# Patient Record
Sex: Female | Born: 1950 | Hispanic: No | Smoking: Current every day smoker
Health system: Southern US, Community
[De-identification: ages and names within clinical notes are randomized; demographics above are authoritative.]

## PROBLEM LIST (undated history)

## (undated) DIAGNOSIS — I10 Essential (primary) hypertension: Secondary | ICD-10-CM

## (undated) DIAGNOSIS — F329 Major depressive disorder, single episode, unspecified: Secondary | ICD-10-CM

## (undated) DIAGNOSIS — B019 Varicella without complication: Secondary | ICD-10-CM

## (undated) DIAGNOSIS — T7840XA Allergy, unspecified, initial encounter: Secondary | ICD-10-CM

## (undated) DIAGNOSIS — E785 Hyperlipidemia, unspecified: Secondary | ICD-10-CM

## (undated) DIAGNOSIS — J45909 Unspecified asthma, uncomplicated: Secondary | ICD-10-CM

## (undated) DIAGNOSIS — F32A Depression, unspecified: Secondary | ICD-10-CM

## (undated) DIAGNOSIS — A630 Anogenital (venereal) warts: Secondary | ICD-10-CM

## (undated) HISTORY — DX: Allergy, unspecified, initial encounter: T78.40XA

## (undated) HISTORY — DX: Unspecified asthma, uncomplicated: J45.909

## (undated) HISTORY — PX: CORONARY ANGIOPLASTY WITH STENT PLACEMENT: SHX49

## (undated) HISTORY — PX: DILATION AND CURETTAGE OF UTERUS: SHX78

## (undated) HISTORY — DX: Hyperlipidemia, unspecified: E78.5

## (undated) HISTORY — DX: Varicella without complication: B01.9

## (undated) HISTORY — DX: Anogenital (venereal) warts: A63.0

---
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
## 2011-06-04 ENCOUNTER — Emergency Department (HOSPITAL_BASED_OUTPATIENT_CLINIC_OR_DEPARTMENT_OTHER): Payer: 59

## 2011-06-04 ENCOUNTER — Emergency Department (HOSPITAL_BASED_OUTPATIENT_CLINIC_OR_DEPARTMENT_OTHER)
Admission: EM | Admit: 2011-06-04 | Discharge: 2011-06-04 | Disposition: A | Discharge status: Other Acute Inpatient Hospital | Payer: 59 | Attending: Emergency Medicine | Admitting: Emergency Medicine

## 2011-06-04 ENCOUNTER — Emergency Department (INDEPENDENT_AMBULATORY_CARE_PROVIDER_SITE_OTHER): Payer: 59

## 2011-06-04 ENCOUNTER — Encounter: Payer: Self-pay | Admitting: *Deleted

## 2011-06-04 ENCOUNTER — Other Ambulatory Visit: Payer: Self-pay

## 2011-06-04 DIAGNOSIS — R079 Chest pain, unspecified: Secondary | ICD-10-CM | POA: Insufficient documentation

## 2011-06-04 DIAGNOSIS — T1490XA Injury, unspecified, initial encounter: Secondary | ICD-10-CM

## 2011-06-04 DIAGNOSIS — W19XXXA Unspecified fall, initial encounter: Secondary | ICD-10-CM

## 2011-06-04 DIAGNOSIS — I214 Non-ST elevation (NSTEMI) myocardial infarction: Secondary | ICD-10-CM

## 2011-06-04 DIAGNOSIS — E119 Type 2 diabetes mellitus without complications: Secondary | ICD-10-CM | POA: Insufficient documentation

## 2011-06-04 DIAGNOSIS — I10 Essential (primary) hypertension: Secondary | ICD-10-CM | POA: Insufficient documentation

## 2011-06-04 HISTORY — DX: Major depressive disorder, single episode, unspecified: F32.9

## 2011-06-04 HISTORY — DX: Essential (primary) hypertension: I10

## 2011-06-04 HISTORY — DX: Depression, unspecified: F32.A

## 2011-06-04 LAB — CK TOTAL AND CKMB (NOT AT ARMC): Relative Index: 7.1 — ABNORMAL HIGH (ref 0.0–2.5)

## 2011-06-04 LAB — CBC
MCH: 29.9 pg (ref 26.0–34.0)
MCHC: 35 g/dL (ref 30.0–36.0)
MCV: 85.4 fL (ref 78.0–100.0)
Platelets: 355 10*3/uL (ref 150–400)
RDW: 13.3 % (ref 11.5–15.5)

## 2011-06-04 LAB — DIFFERENTIAL
Basophils Absolute: 0.1 10*3/uL (ref 0.0–0.1)
Eosinophils Absolute: 0.2 10*3/uL (ref 0.0–0.7)
Eosinophils Relative: 3 % (ref 0–5)

## 2011-06-04 LAB — BASIC METABOLIC PANEL
BUN: 19 mg/dL (ref 6–23)
CO2: 28 mEq/L (ref 19–32)
Calcium: 10.5 mg/dL (ref 8.4–10.5)
Creatinine, Ser: 0.8 mg/dL (ref 0.50–1.10)
Glucose, Bld: 78 mg/dL (ref 70–99)

## 2011-06-04 LAB — URINALYSIS, ROUTINE W REFLEX MICROSCOPIC
Bilirubin Urine: NEGATIVE
Ketones, ur: NEGATIVE mg/dL
Nitrite: NEGATIVE
Protein, ur: NEGATIVE mg/dL

## 2011-06-04 LAB — D-DIMER, QUANTITATIVE: D-Dimer, Quant: 0.39 ug/mL-FEU (ref 0.00–0.48)

## 2011-06-04 LAB — TROPONIN I: Troponin I: 0.97 ng/mL (ref <0.30)

## 2011-06-04 MED ORDER — HEPARIN BOLUS VIA INFUSION
4000.0000 [IU] | Freq: Once | INTRAVENOUS | Status: AC
  Administered 2011-06-04: 4000 [IU] via INTRAVENOUS

## 2011-06-04 MED ORDER — SODIUM CHLORIDE 0.9 % IV BOLUS (SEPSIS)
250.0000 mL | Freq: Once | INTRAVENOUS | Status: AC
  Administered 2011-06-04: 1000 mL via INTRAVENOUS

## 2011-06-04 MED ORDER — SODIUM CHLORIDE 0.9 % IV SOLN
INTRAVENOUS | Status: DC
  Administered 2011-06-04: 16:00:00 via INTRAVENOUS

## 2011-06-04 MED ORDER — HEPARIN (PORCINE) IN NACL 100-0.45 UNIT/ML-% IJ SOLN
14.0000 [IU]/kg/h | INTRAMUSCULAR | Status: DC
Start: 2011-06-04 — End: 2011-06-04
  Administered 2011-06-04: 14 [IU]/kg/h via INTRAVENOUS
  Filled 2011-06-04: qty 250

## 2011-06-04 NOTE — ED Provider Notes (Addendum)
History     Chief Complaint  Patient presents with  . Chest Pain    Was seen at [facility] for aching in her upper chest this am. EKG was done and sent with pt. States she has been tx for bronchtis for the past 3 weeks. Pain today feels different.   Patient is a 60 y.o. female presenting with chest pain. The history is provided by the patient.  Chest Pain The chest pain began 3 - 5 hours ago. Duration of episode(s) is 5 hours. Chest pain occurs constantly. The chest pain is resolved. At its most intense, the pain is at 10/10. The quality of the pain is described as sharp and stabbing. The pain does not radiate. Primary symptoms include fatigue and nausea. Pertinent negatives for primary symptoms include no syncope, no shortness of breath, no abdominal pain and no vomiting.  Associated symptoms include diaphoresis.  Pertinent negatives for associated symptoms include no near-syncope. She tried aspirin for the symptoms. Risk factors include smoking/tobacco exposure.  Her past medical history is significant for diabetes and hypertension.     Past Medical History  Diagnosis Date  . Hypertension   . Diabetes mellitus   . Depression     History reviewed. No pertinent past surgical history.  History reviewed. No pertinent family history.  History  Substance Use Topics  . Smoking status: Current Everyday Smoker  . Smokeless tobacco: Not on file  . Alcohol Use: No    OB History    Grav Para Term Preterm Abortions TAB SAB Ect Mult Living                  Review of Systems  Constitutional: Positive for diaphoresis and fatigue.  HENT: Negative for neck pain.   Eyes: Negative for visual disturbance.  Respiratory: Negative for shortness of breath.   Cardiovascular: Positive for chest pain. Negative for syncope and near-syncope.  Gastrointestinal: Positive for nausea. Negative for vomiting and abdominal pain.  Genitourinary: Negative for dysuria.  Musculoskeletal: Negative for back  pain.  Skin: Negative for rash.  Neurological: Negative for syncope and headaches.  Psychiatric/Behavioral: Negative for confusion.    Physical Exam  BP 140/76  Pulse 79  Temp(Src) 98.1 F (36.7 C) (Oral)  Resp 20  Ht 5\' 9"  (1.753 m)  Wt 235 lb (106.595 kg)  BMI 34.70 kg/m2  SpO2 100%  Physical Exam  Constitutional: She is oriented to person, place, and time. She appears well-developed and well-nourished.  HENT:  Head: Normocephalic and atraumatic.  Eyes: EOM are normal. Pupils are equal, round, and reactive to light.  Neck: Normal range of motion. Neck supple.  Cardiovascular: Normal rate, regular rhythm and normal heart sounds.   Pulmonary/Chest: Breath sounds normal. No respiratory distress. She exhibits no tenderness.  Abdominal: Soft. Bowel sounds are normal. There is no tenderness.  Musculoskeletal: Normal range of motion. She exhibits no edema and no tenderness.  Lymphadenopathy:    She has cervical adenopathy.  Neurological: She is alert and oriented to person, place, and time. No cranial nerve deficit. She exhibits normal muscle tone.  Skin: Skin is warm. No rash noted. She is not diaphoretic.    ED Course   Date: 06/04/2011  Rate: 57  Rhythm: normal sinus rhythm  QRS Axis: normal  Intervals: normal  ST/T Wave abnormalities: normal  Conduction Disutrbances:right bundle branch block  Narrative Interpretation:   Old EKG Reviewed: none available   Procedures  MDM PATIENT HAD ASA AT [facility] AND ONE ON  HER OWN EARLIER. NO HX OF SIMILAR OR PRIOR CP. SIGNIFICANT CARDIAC RISK FACTORS. EVEN IF CARDIAC WORK UP NEGATIVE WILL MOST LIKELY NEED CARDIAC RULE OUT ADMISSION. FOLLOWED [facility] DR Alwyn Ren. CP RESOLVED AT 1300 AND REMAINS GONE.   CARDIAC MARKERS POSITIVE CW NONSTEMI, STILL CP FREE, HEPARIN ORDERED. CONTACTED HER IM GROUP AT [facility]. THEY USE [facility]. THEY CONTACTED AND CARDIOLOGIST IN A PROCEDURE AND WILL CALL BACK. PATIENT  STABLE AND HAS HAD ASA JUST BEFORE ARRIVAL.   CRITICAL CARE TIME 30 MINUTES. FOR FREQUENT RECHECKS, TIME AT BEDSIDE, RESUSCITATION WITH HEPARIN, DW PROVIDERS, AND FAMILY, AND DIAGNOSTIC INTERPRETATION.   ACCEPTED BY [facility], DR ROEBECK, AWAITING BED CONFIRMATION.   GOING TO BED #460, [facility] TO TRANSPORT.       Shelda Jakes, MD 06/04/11 1513  Shelda Jakes, MD 06/04/11 1514  Shelda Jakes, MD 06/04/11 1650  Shelda Jakes, MD 06/04/11 1610  Shelda Jakes, MD 06/04/11 1759

## 2014-09-14 ENCOUNTER — Encounter (HOSPITAL_BASED_OUTPATIENT_CLINIC_OR_DEPARTMENT_OTHER): Payer: Self-pay | Admitting: Emergency Medicine

## 2014-09-14 ENCOUNTER — Emergency Department (HOSPITAL_BASED_OUTPATIENT_CLINIC_OR_DEPARTMENT_OTHER)
Admission: EM | Admit: 2014-09-14 | Discharge: 2014-09-14 | Disposition: A | Discharge status: Home / Self Care | Payer: 59 | Attending: Emergency Medicine | Admitting: Emergency Medicine

## 2014-09-14 ENCOUNTER — Emergency Department (HOSPITAL_BASED_OUTPATIENT_CLINIC_OR_DEPARTMENT_OTHER): Payer: 59

## 2014-09-14 DIAGNOSIS — J209 Acute bronchitis, unspecified: Secondary | ICD-10-CM | POA: Insufficient documentation

## 2014-09-14 DIAGNOSIS — Z79899 Other long term (current) drug therapy: Secondary | ICD-10-CM | POA: Insufficient documentation

## 2014-09-14 DIAGNOSIS — R10819 Abdominal tenderness, unspecified site: Secondary | ICD-10-CM | POA: Insufficient documentation

## 2014-09-14 DIAGNOSIS — R110 Nausea: Secondary | ICD-10-CM | POA: Insufficient documentation

## 2014-09-14 DIAGNOSIS — Z7982 Long term (current) use of aspirin: Secondary | ICD-10-CM | POA: Insufficient documentation

## 2014-09-14 DIAGNOSIS — J40 Bronchitis, not specified as acute or chronic: Secondary | ICD-10-CM

## 2014-09-14 DIAGNOSIS — I10 Essential (primary) hypertension: Secondary | ICD-10-CM | POA: Insufficient documentation

## 2014-09-14 DIAGNOSIS — E119 Type 2 diabetes mellitus without complications: Secondary | ICD-10-CM | POA: Insufficient documentation

## 2014-09-14 DIAGNOSIS — F329 Major depressive disorder, single episode, unspecified: Secondary | ICD-10-CM | POA: Insufficient documentation

## 2014-09-14 DIAGNOSIS — Z720 Tobacco use: Secondary | ICD-10-CM | POA: Insufficient documentation

## 2014-09-14 LAB — COMPREHENSIVE METABOLIC PANEL
ALBUMIN: 2.8 g/dL — AB (ref 3.5–5.2)
ALT: 22 U/L (ref 0–35)
ANION GAP: 15 (ref 5–15)
AST: 28 U/L (ref 0–37)
Alkaline Phosphatase: 82 U/L (ref 39–117)
BILIRUBIN TOTAL: 0.6 mg/dL (ref 0.3–1.2)
BUN: 19 mg/dL (ref 6–23)
CALCIUM: 9.2 mg/dL (ref 8.4–10.5)
CHLORIDE: 89 meq/L — AB (ref 96–112)
CO2: 23 mEq/L (ref 19–32)
CREATININE: 1.1 mg/dL (ref 0.50–1.10)
GFR calc Af Amer: 61 mL/min — ABNORMAL LOW (ref >90)
GFR calc non Af Amer: 52 mL/min — ABNORMAL LOW (ref >90)
Glucose, Bld: 142 mg/dL — ABNORMAL HIGH (ref 70–99)
Potassium: 3.3 mEq/L — ABNORMAL LOW (ref 3.7–5.3)
Sodium: 127 mEq/L — ABNORMAL LOW (ref 137–147)
TOTAL PROTEIN: 6.6 g/dL (ref 6.0–8.3)

## 2014-09-14 LAB — TROPONIN I

## 2014-09-14 LAB — CBC WITH DIFFERENTIAL/PLATELET
BASOS ABS: 0 10*3/uL (ref 0.0–0.1)
BASOS PCT: 0 % (ref 0–1)
EOS ABS: 0 10*3/uL (ref 0.0–0.7)
Eosinophils Relative: 0 % (ref 0–5)
HEMATOCRIT: 36.1 % (ref 36.0–46.0)
Hemoglobin: 12.5 g/dL (ref 12.0–15.0)
Lymphocytes Relative: 4 % — ABNORMAL LOW (ref 12–46)
Lymphs Abs: 0.4 10*3/uL — ABNORMAL LOW (ref 0.7–4.0)
MCH: 29.5 pg (ref 26.0–34.0)
MCHC: 34.6 g/dL (ref 30.0–36.0)
MCV: 85.1 fL (ref 78.0–100.0)
MONO ABS: 0.3 10*3/uL (ref 0.1–1.0)
Monocytes Relative: 3 % (ref 3–12)
NEUTROS ABS: 8.3 10*3/uL — AB (ref 1.7–7.7)
Neutrophils Relative %: 93 % — ABNORMAL HIGH (ref 43–77)
Platelets: 147 10*3/uL — ABNORMAL LOW (ref 150–400)
RBC: 4.24 MIL/uL (ref 3.87–5.11)
RDW: 15.8 % — AB (ref 11.5–15.5)
WBC: 9 10*3/uL (ref 4.0–10.5)

## 2014-09-14 LAB — URINALYSIS, ROUTINE W REFLEX MICROSCOPIC
GLUCOSE, UA: NEGATIVE mg/dL
Ketones, ur: NEGATIVE mg/dL
LEUKOCYTES UA: NEGATIVE
NITRITE: NEGATIVE
PH: 5.5 (ref 5.0–8.0)
Protein, ur: 100 mg/dL — AB
SPECIFIC GRAVITY, URINE: 1.015 (ref 1.005–1.030)
Urobilinogen, UA: 1 mg/dL (ref 0.0–1.0)

## 2014-09-14 LAB — I-STAT CG4 LACTIC ACID, ED: Lactic Acid, Venous: 1.04 mmol/L (ref 0.5–2.2)

## 2014-09-14 LAB — URINE MICROSCOPIC-ADD ON

## 2014-09-14 MED ORDER — AEROCHAMBER Z-STAT PLUS/MEDIUM MISC
1.0000 | Freq: Once | Status: AC
  Administered 2014-09-14: 1
  Filled 2014-09-14: qty 1

## 2014-09-14 MED ORDER — SODIUM CHLORIDE 0.9 % IV BOLUS (SEPSIS)
1000.0000 mL | Freq: Once | INTRAVENOUS | Status: AC
  Administered 2014-09-14: 1000 mL via INTRAVENOUS

## 2014-09-14 MED ORDER — ALBUTEROL SULFATE HFA 108 (90 BASE) MCG/ACT IN AERS: 2.0000 | INHALATION_SPRAY | RESPIRATORY_TRACT | Status: DC | PRN

## 2014-09-14 MED ORDER — DOXYCYCLINE HYCLATE 100 MG PO CAPS: 100.0000 mg | ORAL_CAPSULE | Freq: Two times a day (BID) | ORAL | Status: DC

## 2014-09-14 MED ORDER — IPRATROPIUM BROMIDE 0.02 % IN SOLN
0.5000 mg | Freq: Once | RESPIRATORY_TRACT | Status: AC
  Administered 2014-09-14: 0.5 mg via RESPIRATORY_TRACT
  Filled 2014-09-14: qty 2.5

## 2014-09-14 MED ORDER — ACETAMINOPHEN 500 MG PO TABS
1000.0000 mg | ORAL_TABLET | Freq: Once | ORAL | Status: AC
  Administered 2014-09-14: 1000 mg via ORAL
  Filled 2014-09-14: qty 2

## 2014-09-14 MED ORDER — ALBUTEROL SULFATE (2.5 MG/3ML) 0.083% IN NEBU
5.0000 mg | INHALATION_SOLUTION | Freq: Once | RESPIRATORY_TRACT | Status: AC
  Administered 2014-09-14: 5 mg via RESPIRATORY_TRACT
  Filled 2014-09-14: qty 6

## 2014-09-14 NOTE — ED Notes (Signed)
Pt c/o fever x 3 days and generalized weakness

## 2014-09-14 NOTE — Discharge Instructions (Signed)
Please follow the directions provided.  Be sure to follow-up with your primary care provider to ensure you are getting better.  Please drink plenty of fluids by mouth this could be water or gatorade.  Shawna Orleans may use the inhaler 2 puffs every 4 hours for cough or chest tightness.  Take the antibiotic as prescribed.  He may use Tylenol 650 mg by mouth every 4 hours or ibuprofen 400 mg by mouth every 6 hours for pain or fever. Don't hesitate to return for new, worsening, or concerning symptoms.  SEEK IMMEDIATE MEDICAL CARE IF:  You develop an increased fever or chills.  You have chest pain.  You have severe shortness of breath.  You have bloody sputum.  You develop dehydration.  You faint or repeatedly feel like you are going to pass out.  You develop repeated vomiting.  You develop a severe headache.

## 2014-09-14 NOTE — ED Notes (Signed)
Pt c/o chills and shaking. Temp taken. MD aware.

## 2014-09-14 NOTE — ED Provider Notes (Signed)
CSN: [id number]     Arrival date & time 09/14/14  1333 History   First MD Initiated Contact with Patient 09/14/14 1403     Chief Complaint  Patient presents with  . Fever   (Consider location/radiation/quality/duration/timing/severity/associated sxs/prior Treatment) HPI Rhys Anchondo is a 63 yo female presenting with 3 days of subjective fever and general fatigue. She reports noticing a headache 3 days ago along with feeling hot and then some chills.  She reports also feeling very weak and tired.  She felt so weak that she did not feel like getting out of bed and was intentionally incontinent of urine.  She does smoke, drinks 2 glasses of wine a night but denies any recreational drug use. She did report nausea but denies any dizziness, focal weakness, blurred vision, slurred speech or gait disturbances.  She also denies any chest pain, shortness of breath, abd pain, vomiting or diarrhea.    Past Medical History  Diagnosis Date  . Hypertension   . Diabetes mellitus   . Depression    History reviewed. No pertinent past surgical history. History reviewed. No pertinent family history. History  Substance Use Topics  . Smoking status: Current Every Day Smoker -- 0.50 packs/day  . Smokeless tobacco: Not on file  . Alcohol Use: No   OB History   Grav Para Term Preterm Abortions TAB SAB Ect Mult Living                 Review of Systems  Constitutional: Positive for fever, chills and fatigue.  HENT: Negative for sore throat.   Eyes: Negative for visual disturbance.  Respiratory: Negative for cough and shortness of breath.   Cardiovascular: Negative for chest pain and leg swelling.  Gastrointestinal: Positive for nausea. Negative for vomiting and diarrhea.  Genitourinary: Negative for dysuria.  Musculoskeletal: Negative for myalgias.  Skin: Negative for rash.  Neurological: Negative for weakness, numbness and headaches.    Allergies  Review of patient's allergies indicates no known  allergies.  Home Medications   Prior to Admission medications   Medication Sig Start Date End Date Taking? Authorizing Provider  ALPRAZolam Duanne Moron) 0.5 MG tablet Take 0.5-0.75 mg by mouth 2 (two) times daily as needed. For anxiety     Historical Provider, MD  aspirin 81 MG tablet Take 81 mg by mouth daily.      Historical Provider, MD  B Complex-C (SUPER B COMPLEX PO) Take 1 tablet by mouth every morning.      Historical Provider, MD  fish oil-omega-3 fatty acids 1000 MG capsule Take 1 g by mouth at bedtime.      Historical Provider, MD  hydrochlorothiazide 25 MG tablet Take 25 mg by mouth daily.      Historical Provider, MD  metFORMIN (GLUCOPHAGE) 1000 MG tablet Take 1,000 mg by mouth 2 (two) times daily with a meal.      Historical Provider, MD  Multiple Vitamins-Minerals (MULTIVITAMIN WITH MINERALS) tablet Take 1 tablet by mouth daily. Centrum silver for women     Historical Provider, MD  PRESCRIPTION MEDICATION Inhale 1 puff into the lungs 2 (two) times daily. An sample equivalent to fluticasone, for COPD     Historical Provider, MD  PRESCRIPTION MEDICATION Take 1 tablet by mouth every morning. A generic BP Medication     Historical Provider, MD  venlafaxine (EFFEXOR) 75 MG tablet Take 75 mg by mouth 3 (three) times daily.      Historical Provider, MD   BP 97/56  Pulse 62  Resp 18  Ht 5\' 8"  (1.727 m)  Wt 190 lb (86.183 kg)  BMI 28.90 kg/m2  SpO2 96% Physical Exam  Nursing note and vitals reviewed. Constitutional: She appears well-developed and well-nourished. No distress.  HENT:  Head: Normocephalic and atraumatic.  Mouth/Throat: Mucous membranes are dry. No oropharyngeal exudate.  Eyes: Conjunctivae are normal. Pupils are equal, round, and reactive to light.  Neck: Normal range of motion. Neck supple. No thyromegaly present.  Cardiovascular: Normal rate, regular rhythm and intact distal pulses.   Pulmonary/Chest: Effort normal. No respiratory distress. She has wheezes in the  right middle field, the right lower field, the left middle field and the left lower field. She has no rales. She exhibits no tenderness.  Abdominal: Soft. She exhibits no distension and no mass. There is tenderness. There is no rebound and no guarding.  Musculoskeletal: She exhibits no tenderness.  Lymphadenopathy:    She has no cervical adenopathy.  Neurological: She is alert.  Skin: Skin is warm and dry. No rash noted. She is not diaphoretic.  Psychiatric: She has a normal mood and affect.    ED Course  Procedures (including critical care time) Labs Review Labs Reviewed  CBC WITH DIFFERENTIAL - Abnormal; Notable for the following:    RDW 15.8 (*)    Platelets 147 (*)    Neutrophils Relative % 93 (*)    Neutro Abs 8.3 (*)    Lymphocytes Relative 4 (*)    Lymphs Abs 0.4 (*)    All other components within normal limits  COMPREHENSIVE METABOLIC PANEL - Abnormal; Notable for the following:    Sodium 127 (*)    Potassium 3.3 (*)    Chloride 89 (*)    Glucose, Bld 142 (*)    Albumin 2.8 (*)    GFR calc non Af Amer 52 (*)    GFR calc Af Amer 61 (*)    All other components within normal limits  URINALYSIS, ROUTINE W REFLEX MICROSCOPIC - Abnormal; Notable for the following:    Color, Urine AMBER (*)    APPearance CLOUDY (*)    Hgb urine dipstick MODERATE (*)    Bilirubin Urine SMALL (*)    Protein, ur 100 (*)    All other components within normal limits  URINE MICROSCOPIC-ADD ON - Abnormal; Notable for the following:    Bacteria, UA FEW (*)    Casts GRANULAR CAST (*)    All other components within normal limits  TROPONIN I  I-STAT CG4 LACTIC ACID, ED    Imaging Review  DG Chest 2 View (Final result)  Result time: 09/14/14 14:43:17    Final result by Rad Results In Interface (09/14/14 14:43:17)    Narrative:   CLINICAL DATA: 63 year old female with fever for 3 days. Previously treated for UTI.  EXAM: CHEST - 2 VIEW  COMPARISON: Chest x-ray  06/04/2011  FINDINGS: Cardiomediastinal silhouette projects within normal limits in size and contour. No confluent airspace disease, pneumothorax, or pleural effusion.  No displaced fracture.  Unremarkable appearance of the upper abdomen.  IMPRESSION: No radiographic evidence of acute cardiopulmonary disease.      EKG Interpretation   Date/Time:  Tuesday September 14 2014 16:39:55 EDT Ventricular Rate:  57 PR Interval:  158 QRS Duration: 108 QT Interval:  446 QTC Calculation: 434 R Axis:   -11 Text Interpretation:  Sinus bradycardia Incomplete right bundle branch  block Borderline ECG Similar to prior Confirmed by Mingo Amber  MD, [place]  ([id number]) on 09/14/2014 4:43:23 PM  MDM   Final diagnoses:  Bronchitis   63 yo female with subjective fever, weakness.  Wheezes on auscultation.    NS bolus, Neb tx, CBC, CMP, Troponin, Lactic Acid, UA, CXR  Labs reviewed and case discussed with Dr. Mingo Amber.  Abnormalities on CMP and UA consistent with dehydration.   On re-eval, pt feels better after fluids and meds but now febrile.  Tylenol given.  CXR negative for pneumonia or acute abnormality.  Pt appears safe for discharge , no acute distress noted. Discharge instructions include prescription for abx and MDI and follow-up with her PCP regarding today's visit.  Pt verbalizes understanding and in agreement with plan.  Return precautions provided.    Filed Vitals:   09/14/14 1641 09/14/14 1647 09/14/14 1749 09/14/14 1826  BP: 107/61 107/56  135/56  Pulse: 56 57  76  Temp: 98.4 F (36.9 C) 98.6 F (37 C) 100 F (37.8 C) 100.1 F (37.8 C)  TempSrc: Oral Oral Oral Oral  Resp: 18 16  16   Height:      Weight:      SpO2: 96% 97%  96%   Meds given in ED:  Medications  sodium chloride 0.9 % bolus 1,000 mL (0 mLs Intravenous Stopped 09/14/14 1614)  albuterol (PROVENTIL) (2.5 MG/3ML) 0.083% nebulizer solution 5 mg (5 mg Nebulization Given 09/14/14 1554)  ipratropium (ATROVENT)  nebulizer solution 0.5 mg (0.5 mg Nebulization Given 09/14/14 1554)  acetaminophen (TYLENOL) tablet 1,000 mg (1,000 mg Oral Given 09/14/14 1750)  aerochamber Z-Stat Plus/medium 1 each (1 each Other Given 09/14/14 1826)    Discharge Medication List as of 09/14/2014  6:22 PM    START taking these medications   Details  albuterol (PROVENTIL HFA;VENTOLIN HFA) 108 (90 BASE) MCG/ACT inhaler Inhale 2 puffs into the lungs every 4 (four) hours as needed for wheezing or shortness of breath., Starting 09/14/2014, Until Discontinued, Print    doxycycline (VIBRAMYCIN) 100 MG capsule Take 1 capsule (100 mg total) by mouth 2 (two) times daily., Starting 09/14/2014, Until Discontinued, Print           Britt Bottom, NP 09/18/14 2100

## 2014-09-18 NOTE — ED Provider Notes (Signed)
Medical screening examination/treatment/procedure(s) were performed by non-physician practitioner and as supervising physician I was immediately available for consultation/collaboration.   EKG Interpretation   Date/Time:  Tuesday September 14 2014 16:39:55 EDT Ventricular Rate:  57 PR Interval:  158 QRS Duration: 108 QT Interval:  446 QTC Calculation: 434 R Axis:   -11 Text Interpretation:  Sinus bradycardia Incomplete right bundle branch  block Borderline ECG Similar to prior Confirmed by Mingo Amber  MD, [place]  ([id number]) on 09/14/2014 4:43:23 PM        Evelina Bucy, MD 09/18/14 2332

## 2014-10-06 ENCOUNTER — Emergency Department (HOSPITAL_COMMUNITY): Payer: Worker's Compensation

## 2014-10-06 ENCOUNTER — Encounter (HOSPITAL_COMMUNITY): Payer: Self-pay | Admitting: Emergency Medicine

## 2014-10-06 ENCOUNTER — Emergency Department (HOSPITAL_COMMUNITY)
Admission: EM | Admit: 2014-10-06 | Discharge: 2014-10-06 | Disposition: A | Discharge status: Home / Self Care | Payer: Worker's Compensation | Attending: Emergency Medicine | Admitting: Emergency Medicine

## 2014-10-06 DIAGNOSIS — Z7982 Long term (current) use of aspirin: Secondary | ICD-10-CM | POA: Diagnosis not present

## 2014-10-06 DIAGNOSIS — S99912A Unspecified injury of left ankle, initial encounter: Secondary | ICD-10-CM | POA: Diagnosis present

## 2014-10-06 DIAGNOSIS — S82402A Unspecified fracture of shaft of left fibula, initial encounter for closed fracture: Secondary | ICD-10-CM | POA: Insufficient documentation

## 2014-10-06 DIAGNOSIS — S90122A Contusion of left lesser toe(s) without damage to nail, initial encounter: Secondary | ICD-10-CM | POA: Insufficient documentation

## 2014-10-06 DIAGNOSIS — I10 Essential (primary) hypertension: Secondary | ICD-10-CM | POA: Insufficient documentation

## 2014-10-06 DIAGNOSIS — Y9289 Other specified places as the place of occurrence of the external cause: Secondary | ICD-10-CM | POA: Insufficient documentation

## 2014-10-06 DIAGNOSIS — Y9389 Activity, other specified: Secondary | ICD-10-CM | POA: Diagnosis not present

## 2014-10-06 DIAGNOSIS — Z79899 Other long term (current) drug therapy: Secondary | ICD-10-CM | POA: Insufficient documentation

## 2014-10-06 DIAGNOSIS — F329 Major depressive disorder, single episode, unspecified: Secondary | ICD-10-CM | POA: Insufficient documentation

## 2014-10-06 DIAGNOSIS — E119 Type 2 diabetes mellitus without complications: Secondary | ICD-10-CM | POA: Insufficient documentation

## 2014-10-06 DIAGNOSIS — S50312A Abrasion of left elbow, initial encounter: Secondary | ICD-10-CM | POA: Insufficient documentation

## 2014-10-06 DIAGNOSIS — Y998 Other external cause status: Secondary | ICD-10-CM | POA: Diagnosis not present

## 2014-10-06 DIAGNOSIS — W230XXA Caught, crushed, jammed, or pinched between moving objects, initial encounter: Secondary | ICD-10-CM | POA: Insufficient documentation

## 2014-10-06 DIAGNOSIS — Z720 Tobacco use: Secondary | ICD-10-CM | POA: Insufficient documentation

## 2014-10-06 DIAGNOSIS — M25572 Pain in left ankle and joints of left foot: Secondary | ICD-10-CM

## 2014-10-06 MED ORDER — HYDROCODONE-ACETAMINOPHEN 5-325 MG PO TABS
1.0000 | ORAL_TABLET | Freq: Once | ORAL | Status: AC
  Administered 2014-10-06: 1 via ORAL
  Filled 2014-10-06: qty 1

## 2014-10-06 MED ORDER — HYDROCODONE-ACETAMINOPHEN 5-325 MG PO TABS: 1.0000 | ORAL_TABLET | ORAL | Status: DC | PRN

## 2014-10-06 NOTE — ED Notes (Signed)
Pt refused crutches, sts has a cane at home she prefers to use.

## 2014-10-06 NOTE — ED Notes (Signed)
Bed: WTR9 Expected date:  Expected time:  Means of arrival:  Comments: EMS- foot pain
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
## 2014-10-06 NOTE — ED Notes (Signed)
Pt to xray

## 2014-10-06 NOTE — ED Notes (Signed)
Ortho aware of need for cam walker and crutches

## 2014-10-06 NOTE — Discharge Instructions (Signed)
Follow-up with orthopedics. You may use hydrocodone 1-2 tablets every 4-6 hours as needed for pain. Return to the ER with any worsening of symptoms or new symptoms.  Ankle Fracture A fracture is a break in a bone. The ankle joint is made up of three bones. These include the lower (distal)sections of your lower leg bones, called the tibia and fibula, along with a bone in your foot, called the talus. Depending on how bad the break is and if more than one ankle joint bone is broken, a cast or splint is used to protect and keep your injured bone from moving while it heals. Sometimes, surgery is required to help the fracture heal properly.  There are two general types of fractures:  Stable fracture. This includes a single fracture line through one bone, with no injury to ankle ligaments. A fracture of the talus that does not have any displacement (movement of the bone on either side of the fracture line) is also stable.  Unstable fracture. This includes more than one fracture line through one or more bones in the ankle joint. It also includes fractures that have displacement of the bone on either side of the fracture line. CAUSES  A direct blow to the ankle.   Quickly and severely twisting your ankle.  Trauma, such as a car accident or falling from a significant height. RISK FACTORS You may be at a higher risk of ankle fracture if:  You have certain medical conditions.  You are involved in high-impact sports.  You are involved in a high-impact car accident. SIGNS AND SYMPTOMS   Tender and swollen ankle.  Bruising around the injured ankle.  Pain on movement of the ankle.  Difficulty walking or putting weight on the ankle.  A cold foot below the site of the ankle injury. This can occur if the blood vessels passing through your injured ankle were also damaged.  Numbness in the foot below the site of the ankle injury. DIAGNOSIS  An ankle fracture is usually diagnosed with a physical  exam and X-rays. A CT scan may also be required for complex fractures. TREATMENT  Stable fractures are treated with a cast or splint and using crutches to avoid putting weight on your injured ankle. This is followed by an ankle strengthening program. Some patients require a special type of cast, depending on other medical problems they may have. Unstable fractures require surgery to ensure the bones heal properly. Your health care provider will tell you what type of fracture you have and the best treatment for your condition. HOME CARE INSTRUCTIONS   Review correct crutch use with your health care provider and use your crutches as directed. Safe use of crutches is extremely important. Misuse of crutches can cause you to fall or cause injury to nerves in your hands or armpits.  Do not put weight or pressure on the injured ankle until directed by your health care provider.  To lessen the swelling, keep the injured leg elevated while sitting or lying down.  Apply ice to the injured area:  Put ice in a plastic bag.  Place a towel between your cast and the bag.  Leave the ice on for 20 minutes, 2-3 times a day.  If you have a plaster or fiberglass cast:  Do not try to scratch the skin under the cast with any objects. This can increase your risk of skin infection.  Check the skin around the cast every day. You may put lotion on any red  or sore areas.  Keep your cast dry and clean.  If you have a plaster splint:  Wear the splint as directed.  You may loosen the elastic around the splint if your toes become numb, tingle, or turn cold or blue.  Do not put pressure on any part of your cast or splint; it may break. Rest your cast only on a pillow the first 24 hours until it is fully hardened.  Your cast or splint can be protected during bathing with a plastic bag sealed to your skin with medical tape. Do not lower the cast or splint into water.  Take medicines as directed by your health  care provider. Only take over-the-counter or prescription medicines for pain, discomfort, or fever as directed by your health care provider.  Do not drive a vehicle until your health care provider specifically tells you it is safe to do so.  If your health care provider has given you a follow-up appointment, it is very important to keep that appointment. Not keeping the appointment could result in a chronic or permanent injury, pain, and disability. If you have any problem keeping the appointment, call the facility for assistance. SEEK MEDICAL CARE IF: You develop increased swelling or discomfort. SEEK IMMEDIATE MEDICAL CARE IF:   Your cast gets damaged or breaks.  You have continued severe pain.  You develop new pain or swelling after the cast was put on.  Your skin or toenails below the injury turn blue or gray.  Your skin or toenails below the injury feel cold, numb, or have loss of sensitivity to touch.  There is a bad smell or pus draining from under the cast. MAKE SURE YOU:   Understand these instructions.  Will watch your condition.  Will get help right away if you are not doing well or get worse. Document Released: 11/09/2000 Document Revised: 11/17/2013 Document Reviewed: 06/11/2013 [facility] Patient Information 2015 [place], [place]. This information is not intended to replace advice given to you by your health care provider. Make sure you discuss any questions you have with your health care provider.

## 2016-09-05 ENCOUNTER — Telehealth: Payer: Self-pay | Admitting: Nurse Practitioner

## 2016-09-05 NOTE — Telephone Encounter (Signed)
Pt states her family member see you and she would like to establish with you as well.  However, pt has been waiting on her medicare card and is now just about out of her med's, so would like appt as soon as possible. Is it ok to work her in?

## 2016-09-05 NOTE — Telephone Encounter (Signed)
Yes thanks, that's fine- any combo of 15 minute slots not listed as SDA- should have a few opportunities this week even

## 2016-09-06 NOTE — Telephone Encounter (Signed)
Pt has been scheduled.  °

## 2016-09-10 ENCOUNTER — Encounter: Payer: Self-pay | Admitting: Family Medicine

## 2016-09-10 ENCOUNTER — Ambulatory Visit (INDEPENDENT_AMBULATORY_CARE_PROVIDER_SITE_OTHER): Payer: Medicare Other | Admitting: Family Medicine

## 2016-09-10 VITALS — BP 160/90 | HR 65 | Temp 98.6°F | Ht 66.5 in | Wt 198.8 lb

## 2016-09-10 DIAGNOSIS — I10 Essential (primary) hypertension: Secondary | ICD-10-CM

## 2016-09-10 DIAGNOSIS — Z23 Encounter for immunization: Secondary | ICD-10-CM | POA: Diagnosis not present

## 2016-09-10 DIAGNOSIS — I152 Hypertension secondary to endocrine disorders: Secondary | ICD-10-CM | POA: Insufficient documentation

## 2016-09-10 DIAGNOSIS — F17200 Nicotine dependence, unspecified, uncomplicated: Secondary | ICD-10-CM

## 2016-09-10 DIAGNOSIS — R300 Dysuria: Secondary | ICD-10-CM | POA: Diagnosis not present

## 2016-09-10 DIAGNOSIS — E119 Type 2 diabetes mellitus without complications: Secondary | ICD-10-CM | POA: Diagnosis not present

## 2016-09-10 DIAGNOSIS — F17219 Nicotine dependence, cigarettes, with unspecified nicotine-induced disorders: Secondary | ICD-10-CM | POA: Insufficient documentation

## 2016-09-10 DIAGNOSIS — I2583 Coronary atherosclerosis due to lipid rich plaque: Secondary | ICD-10-CM | POA: Diagnosis not present

## 2016-09-10 DIAGNOSIS — E1121 Type 2 diabetes mellitus with diabetic nephropathy: Secondary | ICD-10-CM | POA: Insufficient documentation

## 2016-09-10 DIAGNOSIS — I2510 Atherosclerotic heart disease of native coronary artery without angina pectoris: Secondary | ICD-10-CM | POA: Insufficient documentation

## 2016-09-10 DIAGNOSIS — I25118 Atherosclerotic heart disease of native coronary artery with other forms of angina pectoris: Secondary | ICD-10-CM | POA: Insufficient documentation

## 2016-09-10 DIAGNOSIS — F3341 Major depressive disorder, recurrent, in partial remission: Secondary | ICD-10-CM | POA: Insufficient documentation

## 2016-09-10 DIAGNOSIS — E1159 Type 2 diabetes mellitus with other circulatory complications: Secondary | ICD-10-CM | POA: Insufficient documentation

## 2016-09-10 MED ORDER — METFORMIN HCL 1000 MG PO TABS: 1000.0000 mg | ORAL_TABLET | Freq: Two times a day (BID) | ORAL | 3 refills | Status: DC

## 2016-09-10 MED ORDER — ALPRAZOLAM 0.5 MG PO TABS: 0.5000 mg | ORAL_TABLET | Freq: Two times a day (BID) | ORAL | 0 refills | Status: DC | PRN

## 2016-09-10 MED ORDER — CLONIDINE HCL 0.1 MG PO TABS: 0.1000 mg | ORAL_TABLET | Freq: Two times a day (BID) | ORAL | 3 refills | Status: DC

## 2016-09-10 MED ORDER — LISINOPRIL-HYDROCHLOROTHIAZIDE 20-25 MG PO TABS: 1.0000 | ORAL_TABLET | Freq: Every day | ORAL | 3 refills | Status: DC

## 2016-09-10 MED ORDER — VENLAFAXINE HCL 75 MG PO TABS: 75.0000 mg | ORAL_TABLET | Freq: Three times a day (TID) | ORAL | 3 refills | Status: DC

## 2016-09-10 NOTE — Progress Notes (Signed)
Phone: [phone]  Subjective:  Patient presents today to establish care. 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   See problem oriented charting  The following were reviewed and entered/updated in epic: Past Medical History:  Diagnosis Date  . Chicken pox   . Depression    reasonable control on venlafaxine 75mg  TID. xanax #14 lasts about 2 years for anxiety portion.   . Diabetes mellitus   . Genital warts    no outbreaks since the 66s  . Hypertension    Patient Active Problem List   Diagnosis Date Noted  . Diabetes mellitus type II, controlled ([place]) 09/10/2016    Priority: High  . CAD (coronary artery disease) 09/10/2016    Priority: High  . Smoker 09/10/2016    Priority: High  . Essential hypertension 09/10/2016    Priority: Medium  . Depression     Priority: Medium   Past Surgical History:  Procedure Laterality Date  . TONSILLECTOMY  1968    Family History  Problem Relation Age of Onset  . Arthritis Mother   . Ovarian cancer Mother   . AAA (abdominal aortic aneurysm) Mother   . Kidney disease Mother   . Hypothyroidism Mother   . Other Father     does not know family history for father  . Stroke Brother     retired MD. [employer]  . Arthritis Brother     hip replacement  . Hyperlipidemia Brother   . Hypertension Brother     Medications- reviewed and updated Current Outpatient Prescriptions  Medication Sig Dispense Refill  . ALPRAZolam (XANAX) 0.5 MG tablet Take 0.5-0.75 mg by mouth 2 (two) times daily as needed. For anxiety     . aspirin 81 MG tablet Take 81 mg by mouth daily.      . hydrochlorothiazide 25 MG tablet Take 25 mg by mouth daily.      . metFORMIN (GLUCOPHAGE) 1000 MG tablet Take 1,000 mg by mouth 2 (two) times daily with a meal.      . Multiple Vitamins-Minerals (MULTIVITAMIN WITH MINERALS) tablet Take 1 tablet by mouth daily. Centrum silver for women     . PRESCRIPTION  MEDICATION Take 1 tablet by mouth every morning. A generic BP Medication     . venlafaxine (EFFEXOR) 75 MG tablet Take 75 mg by mouth 3 (three) times daily.       No current facility-administered medications for this visit.     Allergies-reviewed and updated No Known Allergies  Social History   Social History  . Marital status: Divorced    Spouse name: N/A  . Number of children: N/A  . Years of education: N/A   Social History Main Topics  . Smoking status: Current Every Day Smoker    Packs/day: 0.50  . Smokeless tobacco: None  . Alcohol use No  . Drug use: No  . Sexual activity: Not Asked   Other Topics Concern  . None   Social History Narrative   Divorced. Did not ask about children.       Lives with mother   BS undergrad.    Now struggles to find work and has financial struggles- getting on medicare has helped.    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 Does not "mow or blow"    ROS--Full ROS was completed Review of Systems  Constitutional: Negative for chills and fever.  HENT: Negative for hearing loss.   Eyes: Negative for blurred vision and double vision.  Respiratory: Negative for cough, shortness of breath and wheezing.   Cardiovascular: Negative for chest pain and palpitations.  Gastrointestinal: Negative for heartburn and nausea.  Genitourinary: Positive for dysuria, frequency and urgency.  Musculoskeletal: Negative for myalgias and neck pain.  Skin: Positive for itching (of skin at times). Negative for rash.  Neurological: Negative for dizziness, tingling and headaches.  Endo/Heme/Allergies: Negative for polydipsia. Does not bruise/bleed easily.  Psychiatric/Behavioral: Negative for hallucinations and suicidal ideas.   Objective: BP (!) 160/90 (BP Location: Left Arm, Patient Position: Sitting, Cuff Size: Large)   Pulse 65   Temp 98.6 F (37 C) (Oral)   Ht 5' 6.5"  (1.689 m)   Wt 198 lb 12.8 oz (90.2 kg)   SpO2 95%   BMI 31.61 kg/m  Gen: NAD, resting comfortably HEENT: Mucous membranes are moist. Oropharynx normal. TM normal. Eyes: sclera and lids normal, PERRLA Neck: no thyromegaly, no cervical lymphadenopathy CV: RRR no murmurs rubs or gallops Lungs: CTAB no crackles, wheeze, rhonchi Abdomen: soft/nontender/nondistended/normal bowel sounds. No rebound or guarding.  Ext: no edema Skin: warm, dry Neuro: 5/5 strength in upper and lower extremities, normal gait, normal reflexes  Assessment/Plan:  Will get flu shot at work  Urine culture S: treated for UTI with bactrim in august. Never went away completely. Incomplete evacuation, urgency, mild burning.  A/P: get urine culture instead of UA only  Diabetes mellitus type II, controlled ([place]) S: has been on metformin 1000mg  BID. Was in 6s last time checked. 2 years ago was last time it was checked.  A/P: get updated a1c  Essential hypertension S: controlled previously, poor control now. Used to be on lisinopril-hctz 20-25mg , clonidine 0.1mg  1/2 tablet BID. Ran out and now apparently on clonidine only at night and hctz (possibly) BP Readings from Last 3 Encounters:  09/10/16 (!) 160/90  10/06/14 142/79  09/14/14 135/56  A/P:Continue current meds:  Restart both meds. Recheck 1-2 weeks.  Smoker Advised cessation from 1/2 PPD- not read to quit despite prior MI  CAD (coronary artery disease) S: compliant with [facility]. States no statin. Dr. Marijo File stent with [facility] in [place]. Cobalt chromium stent 06/05/2011. Has not seen him since.  A/P: states never had cholesterol issue- agrees to at least direct LDL though will need full lipid panel- goal LDL <70. Continue aspirin- likely get reestablished with cardiology locally at one of next few follow ups   Asked for BP recheck 1-2 weeks after getting meds through mail order   Orders Placed This Encounter  Procedures  . Urine culture    [facility]   . Pneumococcal conjugate vaccine 13-valent IM  . CBC    [place]  . Comprehensive metabolic panel    [place]  . LDL cholesterol, direct    [place]  . Hemoglobin A1c        Meds ordered this encounter  Medications  . ALPRAZolam (XANAX) 0.5 MG tablet    Sig: Take 1 tablet (0.5 mg total) by mouth 2 (two) times daily as needed for anxiety.    Dispense:  14 tablet    Refill:  0  . venlafaxine (EFFEXOR) 75 MG tablet    Sig: Take 1 tablet (75 mg total) by mouth 3 (three) times daily.    Dispense:  270 tablet    Refill:  3  . lisinopril-hydrochlorothiazide (PRINZIDE,ZESTORETIC) 20-25 MG tablet    Sig:  Take 1 tablet by mouth daily.    Dispense:  90 tablet    Refill:  3  . cloNIDine (CATAPRES) 0.1 MG tablet    Sig: Take 1 tablet (0.1 mg total) by mouth 2 (two) times daily.    Dispense:  180 tablet    Refill:  3  . metFORMIN (GLUCOPHAGE) 1000 MG tablet    Sig: Take 1 tablet (1,000 mg total) by mouth 2 (two) times daily with a meal.    Dispense:  180 tablet    Refill:  3    Return precautions advised.  Garret Reddish, MD

## 2016-09-10 NOTE — Assessment & Plan Note (Signed)
Advised cessation from 1/2 PPD- not read to quit despite prior MI

## 2016-09-10 NOTE — Patient Instructions (Signed)
prevnar 13 before you leave  Labs before you leave  Go sign release of information at check out desk for records for pap smear, from cardiology, from primary care doctor  Refilled Meds ordered this encounter  Medications  . ALPRAZolam (XANAX) 0.5 MG tablet    Sig: Take 1 tablet (0.5 mg total) by mouth 2 (two) times daily as needed for anxiety.    Dispense:  14 tablet    Refill:  0  . venlafaxine (EFFEXOR) 75 MG tablet    Sig: Take 1 tablet (75 mg total) by mouth 3 (three) times daily.    Dispense:  270 tablet    Refill:  3  . lisinopril-hydrochlorothiazide (PRINZIDE,ZESTORETIC) 20-25 MG tablet    Sig: Take 1 tablet by mouth daily.    Dispense:  90 tablet    Refill:  3  . cloNIDine (CATAPRES) 0.1 MG tablet    Sig: Take 1 tablet (0.1 mg total) by mouth 2 (two) times daily.    Dispense:  180 tablet    Refill:  3  . metFORMIN (GLUCOPHAGE) 1000 MG tablet    Sig: Take 1 tablet (1,000 mg total) by mouth 2 (two) times daily with a meal.    Dispense:  180 tablet    Refill:  3

## 2016-09-10 NOTE — Assessment & Plan Note (Signed)
S: has been on metformin 1000mg  BID. Was in 6s last time checked. 2 years ago was last time it was checked.  A/P: get updated a1c

## 2016-09-10 NOTE — Progress Notes (Signed)
Pre visit review using our clinic review tool, if applicable. No additional management support is needed unless otherwise documented below in the visit note. 

## 2016-09-10 NOTE — Assessment & Plan Note (Signed)
S: compliant with [facility]. States no statin. Dr. Marijo File stent with [facility] in [place]. Cobalt chromium stent 06/05/2011. Has not seen him since.  A/P: states never had cholesterol issue- agrees to at least direct LDL though will need full lipid panel- goal LDL <70. Continue aspirin- likely get reestablished with cardiology locally at one of next few follow ups

## 2016-09-10 NOTE — Assessment & Plan Note (Addendum)
S: controlled previously, poor control now. Used to be on lisinopril-hctz 20-25mg , clonidine 0.1mg  1/2 tablet BID. Ran out and now apparently on clonidine only at night and hctz (possibly) BP Readings from Last 3 Encounters:  09/10/16 (!) 160/90  10/06/14 142/79  09/14/14 135/56  A/P:Continue current meds:  Restart both meds. Recheck 1-2 weeks.

## 2016-09-11 LAB — COMPREHENSIVE METABOLIC PANEL
ALT: 11 U/L (ref 0–35)
AST: 12 U/L (ref 0–37)
Albumin: 4.2 g/dL (ref 3.5–5.2)
Alkaline Phosphatase: 110 U/L (ref 39–117)
BUN: 24 mg/dL — ABNORMAL HIGH (ref 6–23)
CO2: 28 meq/L (ref 19–32)
Calcium: 10.6 mg/dL — ABNORMAL HIGH (ref 8.4–10.5)
Chloride: 101 mEq/L (ref 96–112)
Creatinine, Ser: 0.96 mg/dL (ref 0.40–1.20)
GFR: 61.96 mL/min (ref >60.00)
GLUCOSE: 95 mg/dL (ref 70–99)
Potassium: 4.6 mEq/L (ref 3.5–5.1)
Sodium: 137 mEq/L (ref 135–145)
Total Bilirubin: 0.4 mg/dL (ref 0.2–1.2)
Total Protein: 7.2 g/dL (ref 6.0–8.3)

## 2016-09-11 LAB — CBC
HCT: 37.6 % (ref 36.0–46.0)
HEMOGLOBIN: 12.8 g/dL (ref 12.0–15.0)
MCHC: 34 g/dL (ref 30.0–36.0)
MCV: 84.1 fl (ref 78.0–100.0)
Platelets: 356 10*3/uL (ref 150.0–400.0)
RBC: 4.46 Mil/uL (ref 3.87–5.11)
RDW: 17 % — AB (ref 11.5–15.5)
WBC: 7.3 10*3/uL (ref 4.0–10.5)

## 2016-09-11 LAB — LDL CHOLESTEROL, DIRECT: Direct LDL: 137 mg/dL

## 2016-09-11 LAB — HEMOGLOBIN A1C: HEMOGLOBIN A1C: 6 % (ref 4.6–6.5)

## 2016-09-11 LAB — URINE CULTURE

## 2016-09-12 ENCOUNTER — Other Ambulatory Visit: Payer: Self-pay

## 2016-09-12 ENCOUNTER — Telehealth: Payer: Self-pay | Admitting: Family Medicine

## 2016-09-12 MED ORDER — ATORVASTATIN CALCIUM 20 MG PO TABS: 20.0000 mg | ORAL_TABLET | Freq: Every day | ORAL | 3 refills | Status: DC

## 2016-09-12 NOTE — Telephone Encounter (Addendum)
Jessica Curry pt calling for lab results.  Pt state that you can give the results to her mother.

## 2016-09-12 NOTE — Telephone Encounter (Signed)
Jessica Curry pt is calling again for lab results and would like to have them as soon as possible.

## 2016-09-13 ENCOUNTER — Other Ambulatory Visit: Payer: Self-pay

## 2016-09-13 MED ORDER — VENLAFAXINE HCL 75 MG PO TABS: 75.0000 mg | ORAL_TABLET | Freq: Three times a day (TID) | ORAL | 3 refills | Status: DC

## 2016-09-13 NOTE — Telephone Encounter (Signed)
Pt states she will be out from 11:45 am and after. Ok to speak with he mom , But please call this am if possible

## 2016-09-13 NOTE — Telephone Encounter (Signed)
Spoke with patient regarding labs.

## 2016-09-27 ENCOUNTER — Telehealth: Payer: Self-pay | Admitting: Family Medicine

## 2016-09-27 NOTE — Telephone Encounter (Signed)
° °  Pt call to say that she is having issues with the below med  atorvastatin (LIPITOR) 20 MG tablet   She said would like a call back after 3 pm

## 2016-09-28 NOTE — Telephone Encounter (Signed)
Spoke with patient who states she is experiencing leg pain, constipation, and lethargy. She hasn't taken it the last 2 days. She is asking how to lower cholesterol through diet.  Please advise

## 2016-09-28 NOTE — Telephone Encounter (Signed)
Mediterranean diet may help but with her heart history needs to be on statin. Have her take a week off then go to every other day dosing to see how she does.

## 2016-10-01 NOTE — Telephone Encounter (Signed)
Spoke with patient who verbalized understanding of taking a week off and then starting back every other day.

## 2016-10-08 DIAGNOSIS — E119 Type 2 diabetes mellitus without complications: Secondary | ICD-10-CM | POA: Diagnosis not present

## 2016-10-08 DIAGNOSIS — D3191 Benign neoplasm of unspecified part of right eye: Secondary | ICD-10-CM | POA: Diagnosis not present

## 2016-10-08 DIAGNOSIS — H2513 Age-related nuclear cataract, bilateral: Secondary | ICD-10-CM | POA: Diagnosis not present

## 2016-10-08 DIAGNOSIS — H52203 Unspecified astigmatism, bilateral: Secondary | ICD-10-CM | POA: Diagnosis not present

## 2016-10-08 DIAGNOSIS — H524 Presbyopia: Secondary | ICD-10-CM | POA: Diagnosis not present

## 2016-10-08 LAB — HM DIABETES EYE EXAM

## 2016-10-23 ENCOUNTER — Ambulatory Visit (INDEPENDENT_AMBULATORY_CARE_PROVIDER_SITE_OTHER): Payer: Medicare Other | Admitting: Family Medicine

## 2016-10-23 ENCOUNTER — Encounter: Payer: Self-pay | Admitting: Family Medicine

## 2016-10-23 VITALS — BP 128/78 | HR 63 | Temp 98.8°F | Ht 66.5 in | Wt 196.6 lb

## 2016-10-23 DIAGNOSIS — R5383 Other fatigue: Secondary | ICD-10-CM

## 2016-10-23 DIAGNOSIS — I2583 Coronary atherosclerosis due to lipid rich plaque: Secondary | ICD-10-CM

## 2016-10-23 DIAGNOSIS — I2510 Atherosclerotic heart disease of native coronary artery without angina pectoris: Secondary | ICD-10-CM

## 2016-10-23 NOTE — Progress Notes (Signed)
Subjective:  Jessica Curry is a 65 y.o. year old very pleasant female patient who presents for/with See problem oriented charting ROS- no chest pain or shortness of breath. Denies lightheadedness. Does have anxiety at times.see any ROS included in HPI as well.   Past Medical History-  Patient Active Problem List   Diagnosis Date Noted  . Diabetes mellitus type II, controlled ([place]) 09/10/2016    Priority: High  . CAD (coronary artery disease) 09/10/2016    Priority: High  . Smoker 09/10/2016    Priority: High  . Essential hypertension 09/10/2016    Priority: Medium  . Depression     Priority: Medium    Medications- reviewed and updated Current Outpatient Prescriptions  Medication Sig Dispense Refill  . ALPRAZolam (XANAX) 0.5 MG tablet Take 1 tablet (0.5 mg total) by mouth 2 (two) times daily as needed for anxiety. 14 tablet 0  . aspirin 81 MG tablet Take 81 mg by mouth daily.      Marland Kitchen atorvastatin (LIPITOR) 20 MG tablet Take 1 tablet (20 mg total) by mouth daily. 90 tablet 3  . cloNIDine (CATAPRES) 0.1 MG tablet Take 1 tablet (0.1 mg total) by mouth 2 (two) times daily. 180 tablet 3  . lisinopril-hydrochlorothiazide (PRINZIDE,ZESTORETIC) 20-25 MG tablet Take 1 tablet by mouth daily. 90 tablet 3  . metFORMIN (GLUCOPHAGE) 1000 MG tablet Take 1 tablet (1,000 mg total) by mouth 2 (two) times daily with a meal. 180 tablet 3  . Multiple Vitamins-Minerals (MULTIVITAMIN WITH MINERALS) tablet Take 1 tablet by mouth daily. Centrum silver for women     . PRESCRIPTION MEDICATION Take 1 tablet by mouth every morning. A generic BP Medication     . venlafaxine (EFFEXOR) 75 MG tablet Take 1 tablet (75 mg total) by mouth 3 (three) times daily. 270 tablet 3   No current facility-administered medications for this visit.     Objective: BP 128/78 (BP Location: Left Arm, Patient Position: Sitting, Cuff Size: Normal)   Pulse 63   Temp 98.8 F (37.1 C) (Oral)   Ht 5' 6.5" (1.689 m)   Wt 196 lb 9.6 oz  (89.2 kg)   SpO2 97%   BMI 31.26 kg/m  Gen: NAD, resting comfortably TM normal, nares normal, oropharynx normal CV: RRR no murmurs rubs or gallops Lungs: CTAB no crackles, wheeze, rhonchi Abdomen: soft/nontender/nondistended/normal bowel sounds. No rebound or guarding.  Ext: no edema Skin: warm, dry  Assessment/Plan:  Intermittent fatigue S: by midday (after breakfast and later lunch) most days she feels really exhausted like she cannot lift her legs. Then starts to feel nauseated and not interested in food. Happens up to 2 days a week- has temperature in 99s. Goes to bed under several blankets as feels chilled and wakes up feeling better. Thought it was exhaustion but states has not really exerted herself. Going to bed around 6 pm - normal day would be around 8 pm. Wonders if low blood  sugar but has not checked. No chest pain or shortness of breath. 3 months of symptoms- last labs were done while experiencing this but had not mentioned current symptoms. States not overly depressed or sad or anxious.  A/P: discussed option of lab-Tsh, cbc, cmp but did have these except for TSH within about a month. Discussed could be blood sugar issue- gave her onetouch meter with 10 strips to check at times that she feels this way- if sugars <80 would consider decreasing metformin to perhaps 500mg  BID from 1000mg  BID- if sugars not  low- will return for labs discussed above. Does state symptoms are usually after days she takes statin but she was having symptoms even before statin as well.   Also with known CAD- refer back to cardiology- likely needs updated stress testing even unrelated to above  Also get ROI for eye exam- keep slowly plugging away at health maintenance- encourage AWV at next visit   Orders Placed This Encounter  Procedures  . Ambulatory referral to Cardiology    Referral Priority:   Routine    Referral Type:   Consultation    Referral Reason:   Specialty Services Required    Requested  Specialty:   Cardiology    Number of Visits Requested:   1   Return precautions advised especially new or worsening symptoms Garret Reddish, MD

## 2016-10-23 NOTE — Patient Instructions (Addendum)
Let's make sure this is not a low blood sugar issue- check your blood sugar on days you feel like this and let me know- we will likely cut down metformin if this is the case.   Sign release of information at the check out desk for records from your eye doctor  If this is not low blood sugar related. You agreed to come back for labs- can call about this or send [place] mychart message.   Also refer to cardiology for coronary artery disease- should hear within 2-3 weeks, let us know if not

## 2016-10-23 NOTE — Progress Notes (Signed)
Pre visit review using our clinic review tool, if applicable. No additional management support is needed unless otherwise documented below in the visit note. 

## 2016-10-25 NOTE — Progress Notes (Signed)
Abstracted & HM Updated

## 2016-11-12 ENCOUNTER — Ambulatory Visit (INDEPENDENT_AMBULATORY_CARE_PROVIDER_SITE_OTHER): Payer: Medicare Other | Admitting: Cardiology

## 2016-11-12 ENCOUNTER — Encounter: Payer: Self-pay | Admitting: Cardiology

## 2016-11-12 ENCOUNTER — Encounter (INDEPENDENT_AMBULATORY_CARE_PROVIDER_SITE_OTHER): Payer: Self-pay

## 2016-11-12 VITALS — BP 114/70 | HR 59 | Ht 66.5 in | Wt 194.4 lb

## 2016-11-12 DIAGNOSIS — I10 Essential (primary) hypertension: Secondary | ICD-10-CM | POA: Diagnosis not present

## 2016-11-12 DIAGNOSIS — E7800 Pure hypercholesterolemia, unspecified: Secondary | ICD-10-CM | POA: Diagnosis not present

## 2016-11-12 DIAGNOSIS — Z720 Tobacco use: Secondary | ICD-10-CM | POA: Diagnosis not present

## 2016-11-12 DIAGNOSIS — I2510 Atherosclerotic heart disease of native coronary artery without angina pectoris: Secondary | ICD-10-CM

## 2016-11-12 NOTE — Progress Notes (Signed)
Cardiology Office Note    Date:  11/12/2016   ID:  Jessica Curry, DOB Oct 30, 1951, MRN [id number]  PCP:  Garret Reddish, MD  Cardiologist:   Candee Furbish, MD     History of Present Illness:  Jessica Curry is a 65 y.o. female with known coronary artery disease, diabetes, fatigue here to establish care/  June 05, 2011 - Had circumflex stent 3.5 x 23 Multi link [facility] placed in [employer]. She previously worked at [employer], and that morning felt poor, looked green she states. Went to her doctor and ECG done at [facility] and was sent to [address] ER. Drove there she remembers. She then underwent cardiac catheterization which resulted in stent placement as above.  No angina since. Been without heathcare for 5 years after loosing job at [employer]. She works at the [employer] as well part time. Previously did not have access to healthcare.  Brother is MD, went to [facility] she states.   She is a smoker. Not interested in quitting at this point.  Since 2012 she lost 70 pounds.    Past Medical History:  Diagnosis Date  . Chicken pox   . Depression    reasonable control on venlafaxine 75mg  TID. xanax #14 lasts about 2 years for anxiety portion.   . Diabetes mellitus   . Genital warts    no outbreaks since the 61s  . Hypertension     Past Surgical History:  Procedure Laterality Date  . TONSILLECTOMY  1968    Current Medications: Outpatient Medications Prior to Visit  Medication Sig Dispense Refill  . ALPRAZolam (XANAX) 0.5 MG tablet Take 1 tablet (0.5 mg total) by mouth 2 (two) times daily as needed for anxiety. 14 tablet 0  . aspirin 81 MG tablet Take 81 mg by mouth daily.      Marland Kitchen atorvastatin (LIPITOR) 20 MG tablet Take 1 tablet (20 mg total) by mouth daily. 90 tablet 3  . cloNIDine (CATAPRES) 0.1 MG tablet Take 1 tablet (0.1 mg total) by mouth 2 (two) times daily. 180 tablet 3  . lisinopril-hydrochlorothiazide (PRINZIDE,ZESTORETIC) 20-25 MG tablet Take 1 tablet by mouth daily.  90 tablet 3  . metFORMIN (GLUCOPHAGE) 1000 MG tablet Take 1 tablet (1,000 mg total) by mouth 2 (two) times daily with a meal. 180 tablet 3  . Multiple Vitamins-Minerals (MULTIVITAMIN WITH MINERALS) tablet Take 1 tablet by mouth daily. Centrum silver for women     . PRESCRIPTION MEDICATION Take 1 tablet by mouth every morning. A generic BP Medication     . venlafaxine (EFFEXOR) 75 MG tablet Take 1 tablet (75 mg total) by mouth 3 (three) times daily. 270 tablet 3   No facility-administered medications prior to visit.      Allergies:   Patient has no known allergies.   Social History   Social History  . Marital status: Divorced    Spouse name: N/A  . Number of children: N/A  . Years of education: N/A   Social History Main Topics  . Smoking status: Current Every Day Smoker    Packs/day: 0.50  . Smokeless tobacco: None  . Alcohol use No  . Drug use: No  . Sexual activity: Not Asked   Other Topics Concern  . None   Social History Narrative   Divorced. Did not ask about children.       Lives with mother   BS undergrad.    Now struggles to find work and has financial struggles- getting on [employer]  has helped.    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 Does not "mow or blow"     Family History:  The patient's family history includes AAA (abdominal aortic aneurysm) in her mother; Arthritis in her brother and mother; Hyperlipidemia in her brother; Hypertension in her brother; Hypothyroidism in her mother; Kidney disease in her mother; Other in her father; Ovarian cancer in her mother; Stroke in her brother.   ROS:   Please see the history of present illness.    ROS All other systems reviewed and are negative.   PHYSICAL EXAM:   VS:  BP 114/70 (BP Location: Left Arm, Cuff Size: Normal)   Pulse (!) 59   Ht 5' 6.5" (1.689 m)   Wt 194 lb 6.4 oz (88.2 kg)   LMP  (LMP Unknown)   BMI 30.91 kg/m     GEN: Well nourished, well developed, in no acute distress  HEENT: normal  Neck: no JVD, carotid bruits, or masses Cardiac: RRR; no murmurs, rubs, or gallops,no edema  Respiratory:  clear to auscultation bilaterally, normal work of breathing GI: soft, nontender, nondistended, + BS MS: no deformity or atrophy  Skin: warm and dry, no rash Neuro:  Alert and Oriented x 3, Strength and sensation are intact Psych: euthymic mood, full affect  Wt Readings from Last 3 Encounters:  11/12/16 194 lb 6.4 oz (88.2 kg)  10/23/16 196 lb 9.6 oz (89.2 kg)  09/10/16 198 lb 12.8 oz (90.2 kg)      Studies/Labs Reviewed:   EKG:  EKG is  ordered today.  The ekg ordered today demonstrates 11/12/16-sinus bradycardia incomplete right bundle branch block, 59, no other abnormalities.  Recent Labs: 09/10/2016: ALT 11; BUN 24; Creatinine, Ser 0.96; Hemoglobin 12.8; Platelets 356.0; Potassium 4.6; Sodium 137   Lipid Panel    Component Value Date/Time   LDLDIRECT 137.0 09/10/2016 1658    Additional studies/ records that were reviewed today include:  Prior office notes reviewed, EKG, lab work reviewed.    ASSESSMENT:    1. Coronary artery disease involving native coronary artery of native heart without angina pectoris   2. Essential hypertension   3. Pure hypercholesterolemia   4. Tobacco use      PLAN:  In order of problems listed above:  Coronary artery disease  - In July 2012, circumflex stent placed as above.  - No anginal symptoms, no shortness of breath. Doing well.  - Continue to encourage tobacco cessation.  - Continue with aspirin, atorvastatin even if she is only taking this 2-3 times per week.  - No need for any further testing at this time.  Tobacco use  - Edema to encourage tobacco cessation. Her mother quit at a later age.  Hyperlipidemia  - LDL 137, continue to encourage atorvastatin use.  - She mentions that she has had statin intolerance in the past. She is currently  taking this medication 2-3 times per week.  Essential hypertension  - Excellent control on current medications.    Medication Adjustments/Labs and Tests Ordered: Current medicines are reviewed at length with the patient today.  Concerns regarding medicines are outlined above.  Medication changes, Labs and Tests ordered today are listed in the Patient Instructions below. Patient Instructions  Medication Instructions:  The current medical regimen is effective;  continue present plan and medications.  Follow-Up: Follow up in 1 year with Dr. Marlou Porch.  You will  receive a letter in the mail 2 months before you are due.  Please call us when you receive this letter to schedule your follow up appointment.  If you need a refill on your cardiac medications before your next appointment, please call your pharmacy.  Thank you for choosing [facility]!!        Signed, Candee Furbish, MD  11/12/2016 11:29 AM    [address], [place], [place]  [address] Phone: [phone]; Fax: [phone]

## 2016-11-12 NOTE — Patient Instructions (Signed)

## 2017-01-23 ENCOUNTER — Encounter: Payer: Self-pay | Admitting: Family Medicine

## 2017-01-23 ENCOUNTER — Ambulatory Visit (INDEPENDENT_AMBULATORY_CARE_PROVIDER_SITE_OTHER): Payer: Medicare Other | Admitting: Family Medicine

## 2017-01-23 VITALS — BP 136/80 | HR 65 | Temp 98.0°F | Ht 66.5 in | Wt 203.8 lb

## 2017-01-23 DIAGNOSIS — H8111 Benign paroxysmal vertigo, right ear: Secondary | ICD-10-CM

## 2017-01-23 MED ORDER — MECLIZINE HCL 25 MG PO TABS: 25.0000 mg | ORAL_TABLET | Freq: Three times a day (TID) | ORAL | 0 refills | Status: DC | PRN

## 2017-01-23 NOTE — Patient Instructions (Signed)
This appears to be the most common form of vertigo- BPPV as noted below  Meclizine can help with symptoms but will not resolve issue  Need to get crystals back in place and vestibular rehab will help- Roselyn Reef will help set this up.   If not better in a week or symptoms worsen- let me know immediately. Would likely get CT but since your exam was classic we opted not to do that for now  As always- quitting smoking is one of the best things you can do for your health    Benign Positional Vertigo Vertigo is the feeling that you or your surroundings are moving when they are not. Benign positional vertigo is the most common form of vertigo. The cause of this condition is not serious (is benign). This condition is triggered by certain movements and positions (is positional). This condition can be dangerous if it occurs while you are doing something that could endanger you or others, such as driving. What are the causes? In many cases, the cause of this condition is not known. It may be caused by a disturbance in an area of the inner ear that helps your brain to sense movement and balance. This disturbance can be caused by a viral infection (labyrinthitis), head injury, or repetitive motion. What increases the risk? This condition is more likely to develop in:  Women.  People who are 41 years of age or older. What are the signs or symptoms? Symptoms of this condition usually happen when you move your head or your eyes in different directions. Symptoms may start suddenly, and they usually last for less than a minute. Symptoms may include:  Loss of balance and falling.  Feeling like you are spinning or moving.  Feeling like your surroundings are spinning or moving.  Nausea and vomiting.  Blurred vision.  Dizziness.  Involuntary eye movement (nystagmus). Symptoms can be mild and cause only slight annoyance, or they can be severe and interfere with daily life. Episodes of benign positional  vertigo may return (recur) over time, and they may be triggered by certain movements. Symptoms may improve over time. How is this diagnosed? This condition is usually diagnosed by medical history and a physical exam of the head, neck, and ears. You may be referred to a health care provider who specializes in ear, nose, and throat (ENT) problems (otolaryngologist) or a provider who specializes in disorders of the nervous system (neurologist). You may have additional testing, including:  MRI.  A CT scan.  Eye movement tests. Your health care provider may ask you to change positions quickly while he or she watches you for symptoms of benign positional vertigo, such as nystagmus. Eye movement may be tested with an electronystagmogram (ENG), caloric stimulation, the Dix-Hallpike test, or the roll test.  An electroencephalogram (EEG). This records electrical activity in your brain.  Hearing tests. How is this treated? Usually, your health care provider will treat this by moving your head in specific positions to adjust your inner ear back to normal. Surgery may be needed in severe cases, but this is rare. In some cases, benign positional vertigo may resolve on its own in 2-4 weeks. Follow these instructions at home: Safety   Move slowly.Avoid sudden body or head movements.  Avoid driving.  Avoid operating heavy machinery.  Avoid doing any tasks that would be dangerous to you or others if a vertigo episode would occur.  If you have trouble walking or keeping your balance, try using a cane for  stability. If you feel dizzy or unstable, sit down right away.  Return to your normal activities as told by your health care provider. Ask your health care provider what activities are safe for you. General instructions   Take over-the-counter and prescription medicines only as told by your health care provider.  Avoid certain positions or movements as told by your health care provider.  Drink enough  fluid to keep your urine clear or pale yellow.  Keep all follow-up visits as told by your health care provider. This is important. Contact a health care provider if:  You have a fever.  Your condition gets worse or you develop new symptoms.  Your family or friends notice any behavioral changes.  Your nausea or vomiting gets worse.  You have numbness or a "pins and needles" sensation. Get help right away if:  You have difficulty speaking or moving.  You are always dizzy.  You faint.  You develop severe headaches.  You have weakness in your legs or arms.  You have changes in your hearing or vision.  You develop a stiff neck.  You develop sensitivity to light. This information is not intended to replace advice given to you by your health care provider. Make sure you discuss any questions you have with your health care provider. Document Released: 08/20/2006 Document Revised: 04/19/2016 Document Reviewed: 03/07/2015 Elsevier Interactive Patient Education  2017 [employer].

## 2017-01-23 NOTE — Progress Notes (Signed)
Subjective:  Jessica Curry is a 66 y.o. year old very pleasant female patient who presents for/with See problem oriented charting ROS- No facial or extremity weakness. No slurred words or trouble swallowing. no blurry vision or double vision. No paresthesias. No confusion or word finding difficulties.    Past Medical History-  Patient Active Problem List   Diagnosis Date Noted  . Diabetes mellitus type II, controlled ([place]) 09/10/2016    Priority: High  . CAD (coronary artery disease) 09/10/2016    Priority: High  . Smoker 09/10/2016    Priority: High  . Essential hypertension 09/10/2016    Priority: Medium  . Depression     Priority: Medium    Medications- reviewed and updated Current Outpatient Prescriptions  Medication Sig Dispense Refill  . ALPRAZolam (XANAX) 0.5 MG tablet Take 1 tablet (0.5 mg total) by mouth 2 (two) times daily as needed for anxiety. 14 tablet 0  . aspirin 81 MG tablet Take 81 mg by mouth daily.      Marland Kitchen atorvastatin (LIPITOR) 20 MG tablet Take 1 tablet (20 mg total) by mouth daily. 90 tablet 3  . cloNIDine (CATAPRES) 0.1 MG tablet Take 1 tablet (0.1 mg total) by mouth 2 (two) times daily. 180 tablet 3  . lisinopril-hydrochlorothiazide (PRINZIDE,ZESTORETIC) 20-25 MG tablet Take 1 tablet by mouth daily. 90 tablet 3  . metFORMIN (GLUCOPHAGE) 1000 MG tablet Take 1 tablet (1,000 mg total) by mouth 2 (two) times daily with a meal. 180 tablet 3  . Multiple Vitamins-Minerals (MULTIVITAMIN WITH MINERALS) tablet Take 1 tablet by mouth daily. Centrum silver for women     . PRESCRIPTION MEDICATION Take 1 tablet by mouth every morning. A generic BP Medication     . venlafaxine (EFFEXOR) 75 MG tablet Take 1 tablet (75 mg total) by mouth 3 (three) times daily. 270 tablet 3   No current facility-administered medications for this visit.     Objective: BP 136/80 (BP Location: Left Arm, Patient Position: Sitting, Cuff Size: Large)   Pulse 65   Temp 98 F (36.7 C) (Oral)   Ht  5' 6.5" (1.689 m)   Wt 203 lb 12.8 oz (92.4 kg)   LMP  (LMP Unknown)   SpO2 97%   BMI 32.40 kg/m  Gen: NAD, resting comfortably CV: RRR no murmurs rubs or gallops Lungs: CTAB no crackles, wheeze, rhonchi Ext: no edema Neuro: CN II-XII intact, sensation and reflexes normal throughout, 5/5 muscle strength in bilateral upper and lower extremities. Normal finger to nose. Normal rapid alternating movements. No pronator drift. Normal romberg. Normal gait.   Dix hallpike to right side produces nystagmus and recurrence of vertigo   Assessment/Plan:  Benign paroxysmal positional vertigo of right ear S: fell and hit head on ice about 5 weeks ago. Hit the back side of her head on the left. Did not pass out. Went to a clinic related to the auto auction- evaluated by physician and was told if she had any symptoms over weekend to go to ER. Head ached and mildly dizzy.   Had a flu like illness for about 5 days since that time then for another week could get up and get going but then needed a nap around lunch. That has since cleared. Feels more out of shape as was less active.   Has not felt right for these entire 5 weeks.   2 weeks ago had one day of vertigo then again Sunday, Monday, Tuesday, very mildly today. Feels like the room is spinning  and does not feel like she has the most secure footing as result. Only occurs with head movement. No hearing loss. No tinnitus.   Had similar about 5 years ago and was told BPPV likely it sounds like as was told- it would go away  A/P: Initial concern with prior hitting her head was if this could be related to that fall 5 weeks ago. Her symptoms of vertigo did not start until 2 weeks ago and then more severe in recent days. With positive dix hallpike and fact issue only occurs with movement- doubt much less likely CVA or TIA or subdural hematoma. Is not having regular headaches but did ache for sometime at spot of fall. [facility]  [facility]. If she does not improve within a week or worsens would get head CT (likely hold off on MRI for now). We discussed emergent and urgent return precautions such as if she worsens. Meclizine prn for vertiog- discussed only symptomatic  Doubt stroke or meniere's or vestibular neuritis.   also advised as always for patient to quit smoking   Meds ordered this encounter  Medications  . meclizine (ANTIVERT) 25 MG tablet    Sig: Take 1 tablet (25 mg total) by mouth 3 (three) times daily as needed for dizziness.    Dispense:  30 tablet    Refill:  0    Return precautions advised.  Garret Reddish, MD

## 2017-01-23 NOTE — Progress Notes (Signed)
Pre visit review using our clinic review tool, if applicable. No additional management support is needed unless otherwise documented below in the visit note. 

## 2017-01-24 DIAGNOSIS — R262 Difficulty in walking, not elsewhere classified: Secondary | ICD-10-CM | POA: Diagnosis not present

## 2017-01-24 DIAGNOSIS — H8112 Benign paroxysmal vertigo, left ear: Secondary | ICD-10-CM | POA: Diagnosis not present

## 2017-01-24 DIAGNOSIS — R42 Dizziness and giddiness: Secondary | ICD-10-CM | POA: Diagnosis not present

## 2017-01-29 DIAGNOSIS — H8112 Benign paroxysmal vertigo, left ear: Secondary | ICD-10-CM | POA: Diagnosis not present

## 2017-01-29 DIAGNOSIS — R262 Difficulty in walking, not elsewhere classified: Secondary | ICD-10-CM | POA: Diagnosis not present

## 2017-01-29 DIAGNOSIS — R42 Dizziness and giddiness: Secondary | ICD-10-CM | POA: Diagnosis not present

## 2017-02-01 DIAGNOSIS — R262 Difficulty in walking, not elsewhere classified: Secondary | ICD-10-CM | POA: Diagnosis not present

## 2017-02-01 DIAGNOSIS — R42 Dizziness and giddiness: Secondary | ICD-10-CM | POA: Diagnosis not present

## 2017-02-01 DIAGNOSIS — H8112 Benign paroxysmal vertigo, left ear: Secondary | ICD-10-CM | POA: Diagnosis not present

## 2017-02-07 DIAGNOSIS — R262 Difficulty in walking, not elsewhere classified: Secondary | ICD-10-CM | POA: Diagnosis not present

## 2017-02-07 DIAGNOSIS — R42 Dizziness and giddiness: Secondary | ICD-10-CM | POA: Diagnosis not present

## 2017-02-07 DIAGNOSIS — H8112 Benign paroxysmal vertigo, left ear: Secondary | ICD-10-CM | POA: Diagnosis not present

## 2017-03-14 ENCOUNTER — Encounter: Payer: Self-pay | Admitting: Family Medicine

## 2017-03-14 ENCOUNTER — Ambulatory Visit (INDEPENDENT_AMBULATORY_CARE_PROVIDER_SITE_OTHER): Payer: Medicare Other | Admitting: Family Medicine

## 2017-03-14 VITALS — BP 130/72 | HR 82 | Temp 98.7°F | Ht 66.5 in | Wt 203.6 lb

## 2017-03-14 DIAGNOSIS — F17200 Nicotine dependence, unspecified, uncomplicated: Secondary | ICD-10-CM | POA: Diagnosis not present

## 2017-03-14 DIAGNOSIS — I10 Essential (primary) hypertension: Secondary | ICD-10-CM

## 2017-03-14 MED ORDER — VALSARTAN-HYDROCHLOROTHIAZIDE 160-25 MG PO TABS: 1.0000 | ORAL_TABLET | Freq: Every day | ORAL | 3 refills | Status: DC

## 2017-03-14 NOTE — Progress Notes (Signed)
Pre visit review using our clinic review tool, if applicable. No additional management support is needed unless otherwise documented below in the visit note. 

## 2017-03-14 NOTE — Progress Notes (Signed)
Subjective:  Jessica Curry is a 66 y.o. year old very pleasant female patient who presents for/with See problem oriented charting ROS- some fatigue since November- just doesn't feel like she has "get up and go" but does not have chest pain or shortness of breath. No SI.  Some weight gain  Past Medical History-  Patient Active Problem List   Diagnosis Date Noted  . Diabetes mellitus type II, controlled ([place]) 09/10/2016    Priority: High  . CAD (coronary artery disease) 09/10/2016    Priority: High  . Smoker 09/10/2016    Priority: High  . Essential hypertension 09/10/2016    Priority: Medium  . Depression     Priority: Medium    Medications- reviewed and updated Current Outpatient Prescriptions  Medication Sig Dispense Refill  . ALPRAZolam (XANAX) 0.5 MG tablet Take 1 tablet (0.5 mg total) by mouth 2 (two) times daily as needed for anxiety. 14 tablet 0  . aspirin 81 MG tablet Take 81 mg by mouth daily.      Marland Kitchen atorvastatin (LIPITOR) 20 MG tablet Take 1 tablet (20 mg total) by mouth daily. 90 tablet 3  . cloNIDine (CATAPRES) 0.1 MG tablet Take 1 tablet (0.1 mg total) by mouth 2 (two) times daily. 180 tablet 3  . meclizine (ANTIVERT) 25 MG tablet Take 1 tablet (25 mg total) by mouth 3 (three) times daily as needed for dizziness. 30 tablet 0  . metFORMIN (GLUCOPHAGE) 1000 MG tablet Take 1 tablet (1,000 mg total) by mouth 2 (two) times daily with a meal. 180 tablet 3  . Multiple Vitamins-Minerals (MULTIVITAMIN WITH MINERALS) tablet Take 1 tablet by mouth daily. Centrum silver for women     . PRESCRIPTION MEDICATION Take 1 tablet by mouth every morning. A generic BP Medication     . venlafaxine (EFFEXOR) 75 MG tablet Take 1 tablet (75 mg total) by mouth 3 (three) times daily. 270 tablet 3  . valsartan-hydrochlorothiazide (DIOVAN-HCT) 160-25 MG tablet Take 1 tablet by mouth daily. 90 tablet 3   No current facility-administered medications for this visit.     Objective: BP 130/72 (BP  Location: Left Arm, Patient Position: Sitting, Cuff Size: Large)   Pulse 82   Temp 98.7 F (37.1 C) (Oral)   Ht 5' 6.5" (1.689 m)   Wt 203 lb 9.6 oz (92.4 kg)   LMP  (LMP Unknown)   SpO2 96%   BMI 32.37 kg/m  Gen: NAD, resting comfortably CV: RRR no murmurs rubs or gallops Lungs: CTAB no crackles. Occasional wheeze Ext: no edema Skin: warm, dry  Assessment/Plan:   Essential hypertension S: controlled on lisinopril-hctz 20-25mg , clonidine 0.1mg  tablet BID. Daily cough- though more likely related to smoking BP Readings from Last 3 Encounters:  03/14/17 130/72  01/23/17 136/80  11/12/16 114/70  A/P: change to valsartan-hctz 160-25mg . CPE 3-4 months advised  Smoker 0.75 packs per day over lifetime of smoking. Smoking over 45 years- will refer to lung cancer screening program.   Patient also required some counseling about some minor concerns. Fatigue noted in ROS- advised updating labs- she agrees to return for CPE in 3-4 months as symptoms have not ben progressive lately  Weight trending up. 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 Agrees to get restarted with physical activity - required counseling to discuss needs weight loss  Sometimes doesn't feel like going to dinner. Denies worsening depression though would update phq9 at follow up likely regardless.   Yesterday temperature around 99- states  gets 4-5x a year and temperature usually in 96-97 range. We discussed threshold for fever remains at 100.5 and that these are not true fevers.   Return in about 3 months (around 06/13/2017).  Orders Placed This Encounter  Procedures  . Ambulatory Referral for Lung Cancer Scre    Referral Priority:   Routine    Referral Type:   Consultation    Referral Reason:   Specialty Services Required    Number of Visits Requested:   1    Meds ordered this encounter  Medications  . valsartan-hydrochlorothiazide (DIOVAN-HCT) 160-25 MG tablet    Sig: Take 1 tablet by  mouth daily.    Dispense:  90 tablet    Refill:  3   The duration of face-to-face time during this visit was greater than 18 minutes. Greater than 50% of this time was spent in counseling, explanation of diagnosis, planning of further management, and/or coordination of care including - particularly need to quit smoking, return precautions, fever, definition, weight loss counseling  Return precautions advised.  Garret Reddish, MD

## 2017-03-14 NOTE — Patient Instructions (Addendum)
Trial valsartan -hctz instead of lisinopril hctz to see if that reduces cough some. Cough may be related to copd and may need to consider referral to pulmonary  We will call you within a week or two about your referral to lung cancer screening program within [place]. If you do not hear within 3 weeks, give [place] a call.   Working on weight loss also key   My 5 to Fitness!  5: fruits and vegetables per day (work on 9 per day if you are at 5) 4: exercise 4-5 times per week for at least 30 minutes (walking counts!) 3: meals per day (don't skip breakfast!) 2: habits to quit -smoking -excess alcohol use (men >2 beer/day; women >1beer/day) 0-1: sweet per day (2 cookies, 1 small cup of ice cream, 12 oz soda)  These are general tips for healthy living. Try to start with 1 or 2 habit TODAY and make it a part of your life for several months.   Once you have 1 or 2 habits down for several months, try to begin working on your next healthy habit. With every single step you take, you will be leading a healthier lifestyle!

## 2017-03-15 NOTE — Assessment & Plan Note (Signed)
S: controlled on lisinopril-hctz 20-25mg , clonidine 0.1mg  tablet BID. Daily cough- though more likely related to smoking BP Readings from Last 3 Encounters:  03/14/17 130/72  01/23/17 136/80  11/12/16 114/70  A/P: change to valsartan-hctz 160-25mg . CPE 3-4 months advised

## 2017-03-15 NOTE — Assessment & Plan Note (Signed)
0.75 packs per day over lifetime of smoking. Smoking over 45 years- will refer to lung cancer screening program.

## 2017-03-21 ENCOUNTER — Other Ambulatory Visit: Payer: Self-pay | Admitting: Acute Care

## 2017-03-21 DIAGNOSIS — F17210 Nicotine dependence, cigarettes, uncomplicated: Principal | ICD-10-CM

## 2017-03-25 ENCOUNTER — Ambulatory Visit (INDEPENDENT_AMBULATORY_CARE_PROVIDER_SITE_OTHER)
Admission: RE | Admit: 2017-03-25 | Discharge: 2017-03-25 | Disposition: A | Discharge status: Home / Self Care | Payer: Medicare Other | Source: Ambulatory Visit | Attending: Acute Care | Admitting: Acute Care

## 2017-03-25 ENCOUNTER — Encounter: Payer: Self-pay | Admitting: Acute Care

## 2017-03-25 ENCOUNTER — Ambulatory Visit (INDEPENDENT_AMBULATORY_CARE_PROVIDER_SITE_OTHER): Payer: Medicare Other | Admitting: Acute Care

## 2017-03-25 DIAGNOSIS — F17210 Nicotine dependence, cigarettes, uncomplicated: Secondary | ICD-10-CM | POA: Diagnosis not present

## 2017-03-25 DIAGNOSIS — Z87891 Personal history of nicotine dependence: Secondary | ICD-10-CM | POA: Diagnosis not present

## 2017-03-25 NOTE — Progress Notes (Signed)
Shared Decision Making Visit Lung Cancer Screening Program [phone])   Eligibility:  Age 66 y.o.  Pack Years Smoking History Calculation 45 pack years smoking history (# packs/per year x # years smoked)  Recent History of coughing up blood no  Unexplained weight loss? no ( >Than 15 pounds within the last 6 months )  Prior History Lung / other cancer no (Diagnosis within the last 5 years already requiring surveillance chest CT Scans).  Smoking Status Current Smoker  Former Smokers: Years since quit: NA  Quit Date: NA  Visit Components:  Discussion included one or more decision making aids. yes  Discussion included risk/benefits of screening. yes  Discussion included potential follow up diagnostic testing for abnormal scans. yes  Discussion included meaning and risk of over diagnosis. yes  Discussion included meaning and risk of False Positives. yes  Discussion included meaning of total radiation exposure. yes  Counseling Included:  Importance of adherence to annual lung cancer LDCT screening. yes  Impact of comorbidities on ability to participate in the program. yes  Ability and willingness to under diagnostic treatment. yes  Smoking Cessation Counseling:  Current Smokers:   Discussed importance of smoking cessation. yes  Information about tobacco cessation classes and interventions provided to patient. yes  Patient provided with "ticket" for LDCT Scan. yes  Symptomatic Patient. no  Counseling  Diagnosis Code: Tobacco Use Z72.0  Asymptomatic Patient yes  Counseling (Intermediate counseling: > three minutes counseling) N5621  Former Smokers:   Discussed the importance of maintaining cigarette abstinence. yes  Diagnosis Code: Personal History of Nicotine Dependence. H08.657  Information about tobacco cessation classes and interventions provided to patient. Yes  Patient provided with "ticket" for LDCT Scan. yes  Written Order for Lung Cancer  Screening with LDCT placed in Epic. Yes (CT Chest Lung Cancer Screening Low Dose W/O CM) [id number] Z12.2-Screening of respiratory organs Z87.891-Personal history of nicotine dependence  I have spent 25 minutes of face to face time with Mrs. Haring discussing the risks and benefits of lung cancer screening. We viewed a power point together that explained in detail the above noted topics. We paused at intervals to allow for questions to be asked and answered to ensure understanding.We discussed that the single most powerful action that she can take to decrease her risk of developing lung cancer is to quit smoking. We discussed whether or not she is ready to commit to setting a quit date. Mrs. Childers is currently not ready to set a quit date. We discussed options for tools to aid in quitting smoking including nicotine replacement therapy, non-nicotine medications, support groups, Quit Smart classes, and behavior modification. We discussed that often times setting smaller, more achievable goals, such as eliminating 1 cigarette a day for a week and then 2 cigarettes a day for a week can be helpful in slowly decreasing the number of cigarettes smoked. This allows for a sense of accomplishment as well as providing a clinical benefit. I gave Mrs. Sharps the " Be Stronger Than Your Excuses" card with contact information for community resources, classes, free nicotine replacement therapy, and access to mobile apps, text messaging, and on-line smoking cessation help. I have also given her my card and contact information in the event she needs to contact me. We discussed the time and location of the scan, and that either Doroteo Glassman RN or I will call with the results within 24-48 hours of receiving them. I have offered her  a copy of the power point we viewed  as a resource in the event they need reinforcement of the concepts we discussed today in the office. The patient verbalized understanding of all of  the above  and had no further questions upon leaving the office. They have my contact information in the event they have any further questions.  I spent 3 minutes counseling on smoking cessation and the health risks of continued tobacco abuse.  I explained to the patient that there has been a high incidence of coronary artery disease noted on these exams. I explained that this is a non-gated exam therefore degree or severity cannot be determined. This patient is currently on statin therapy per her PCP. I have asked the patient to follow-up with their PCP regarding any incidental finding of coronary artery disease and management with diet or medication as their PCP  feels is clinically indicated. The patient verbalized understanding of the above and had no further questions upon completion of the visit.      Magdalen Spatz, NP 03/25/2017

## 2017-03-28 ENCOUNTER — Telehealth: Payer: Self-pay | Admitting: Acute Care

## 2017-03-28 DIAGNOSIS — F17210 Nicotine dependence, cigarettes, uncomplicated: Principal | ICD-10-CM

## 2017-03-29 NOTE — Telephone Encounter (Signed)
Pt calling to speak to nurse a/b lung screening and says call after 4 @ [phone].Jessica Curry

## 2017-03-29 NOTE — Telephone Encounter (Signed)
Will forward to the lung nodule screening pool 

## 2017-04-05 NOTE — Telephone Encounter (Signed)
Pt returning call for results of lung screening CT pt can be reached @ [phone].Jessica Curry

## 2017-04-05 NOTE — Telephone Encounter (Signed)
LMTC x 1  

## 2017-04-09 NOTE — Telephone Encounter (Signed)
Pt informed of CT results per Sarah Groce, NP.  PT verbalized understanding.  Copy sent to PCP.  Order placed for 1 yr f/u CT.  

## 2017-04-09 NOTE — Telephone Encounter (Signed)
Spoke with pt and she will have her daughter call back to discuss CT results with her.  Will await call

## 2017-06-04 ENCOUNTER — Other Ambulatory Visit: Payer: Self-pay

## 2017-06-04 ENCOUNTER — Ambulatory Visit (INDEPENDENT_AMBULATORY_CARE_PROVIDER_SITE_OTHER): Payer: Medicare Other | Admitting: Family Medicine

## 2017-06-04 ENCOUNTER — Encounter: Payer: Self-pay | Admitting: Family Medicine

## 2017-06-04 VITALS — BP 138/76 | HR 63 | Temp 98.2°F | Ht 67.25 in | Wt 205.6 lb

## 2017-06-04 DIAGNOSIS — I10 Essential (primary) hypertension: Secondary | ICD-10-CM

## 2017-06-04 DIAGNOSIS — E119 Type 2 diabetes mellitus without complications: Secondary | ICD-10-CM

## 2017-06-04 DIAGNOSIS — I2510 Atherosclerotic heart disease of native coronary artery without angina pectoris: Secondary | ICD-10-CM

## 2017-06-04 DIAGNOSIS — E785 Hyperlipidemia, unspecified: Secondary | ICD-10-CM

## 2017-06-04 DIAGNOSIS — I700 Atherosclerosis of aorta: Secondary | ICD-10-CM

## 2017-06-04 DIAGNOSIS — I2583 Coronary atherosclerosis due to lipid rich plaque: Secondary | ICD-10-CM

## 2017-06-04 DIAGNOSIS — E1169 Type 2 diabetes mellitus with other specified complication: Secondary | ICD-10-CM | POA: Insufficient documentation

## 2017-06-04 DIAGNOSIS — F17200 Nicotine dependence, unspecified, uncomplicated: Secondary | ICD-10-CM

## 2017-06-04 DIAGNOSIS — Z780 Asymptomatic menopausal state: Secondary | ICD-10-CM | POA: Diagnosis not present

## 2017-06-04 MED ORDER — METFORMIN HCL 1000 MG PO TABS: 1000.0000 mg | ORAL_TABLET | Freq: Two times a day (BID) | ORAL | 0 refills | Status: DC

## 2017-06-04 NOTE — Assessment & Plan Note (Signed)
S: previously poorly controlled on no statin. No myalgias.  Lab Results  Component Value Date   LDLDIRECT 137.0 09/10/2016   A/P: able to tolerate 3x a week atorvastatin 20mg - update lipids- continue to titrate up as able

## 2017-06-04 NOTE — Assessment & Plan Note (Signed)
S: has been controlled. On metformin alone 1g BID Exercise and diet- active as noted above. Not losing weight- discussed tightening diet up and she will work on this Lab Results  Component Value Date   HGBA1C 6.0 09/10/2016   A/P: update future fasting labs

## 2017-06-04 NOTE — Assessment & Plan Note (Signed)
Discussed importance of quitting smoking, controlling lipids and blood pressure

## 2017-06-04 NOTE — Patient Instructions (Addendum)
  Jessica Curry , Thank you for taking time to come for your Medicare Wellness Visit. I appreciate your ongoing commitment to your health goals. Please review the following plan we discussed and let me know if I can assist you in the future.   These are the goals we discussed: 1. Discuss shingrix with your pharmacist  2. Could also get Td tetanus shot at pharmacy 3. PLEASE get your mammogram 4. Please call gynecology for possible final pap smear 5. Please send cologuard in 6. Schedule your bone density test at check out desk 7. Strongly encourage yout o quit smoking 8. Weight loss goal of at least 5 lbs in 4 months. We could refer to diabetic education at follow up if not making progress.   This is a list of the screening recommended for you and due dates:  Health Maintenance  Topic Date Due  . Complete foot exam   07/05/1961  . Eye exam for diabetics  07/05/1961  . Tetanus Vaccine  07/05/1970  . Pap Smear  07/05/1972  . Mammogram  07/05/2001  . Colon Cancer Screening  07/05/2001  . DEXA scan (bone density measurement)  07/05/2016  . Hemoglobin A1C  03/11/2017  . Flu Shot  06/26/2017  . Pneumonia vaccines (2 of 2 - PPSV23) 09/10/2017  .  Hepatitis C: One time screening is recommended by Center for Disease Control  (CDC) for  adults born from [birth year] through 1965.   Addressed  . HIV Screening  Addressed

## 2017-06-04 NOTE — Assessment & Plan Note (Signed)
Asymptomatic. Compliant with aspirin and statin.

## 2017-06-04 NOTE — Progress Notes (Signed)
Phone: [phone]  Subjective:  Patient presents today for their annual wellness visit.    Preventive Screening-Counseling & Management  Vision screen: normal  Visual Acuity Screening   Right eye Left eye Both eyes  Without correction:     With correction: 20/20 20/20 20/20     Advanced directives: Full Code. No HCPOA- wants family to discuss together.   Smoking Status: Current Smoker- advised cessation from 1/2 PPD but she is not ready to quit Second Hand Smoking status: No smokers in home other than her  Risk Factors Regular exercise: 3-5 days a week walking or yard work 30-60 minutes a day Diet: improving currently- discussed importance of weight loss. Unfortunately up 2 lbs today- hopeful she can reverse this. Drinks Engineer, manufacturing.   Fall Risk: yes Fall Risk  06/04/2017 09/10/2016  Falls in the past year? Yes Yes  Number falls in past yr: 2 or more 2 or more  Injury with Fall? Yes Yes  Risk Factor Category  High Fall Risk -  Follow up Education provided -    Cardiac risk factors:  advanced age (older than 86 for men, 26 for women)  Hyperlipidemia yes but on statin and trying to increase dose Diabetes- needs updated a1c Hypertension- see below   Depression Screen None. PHQ2 of 1. States sometimes feels down with 1000 a month ad fearful of future with finances. Takes venlafaxine 75mg  TID- though misses third dose several times a week Depression screen PHQ 2/9 09/10/2016  Decreased Interest 0  Down, Depressed, Hopeless 1  PHQ - 2 Score 1    Activities of Daily Living Independent ADLs and IADLs   Hearing Difficulties: -patient declines  Cognitive Testing No reported trouble.    Normal 3 word recall  List the Names of Other Physician/Practitioners you currently use: -Dr. Pierre Bali optho -Dr. Marlou Porch cardiology - Dr. Percell Miller orthopedics  Immunization History  Administered Date(s) Administered  . Influenza-Unspecified 09/24/2016  .  Pneumococcal Conjugate-13 09/10/2016   Required Immunizations needed today : due for pneumovax 23 later this year. Can get shingrix at pharmacy.   Screening tests- up to date Health Maintenance Due  Topic Date Due  . FOOT EXAM - normal today 07/05/1961  . OPHTHALMOLOGY EXAM - had 10/08/16- will abstract 07/05/1961  . TETANUS/TDAP - check with pharmacist 07/05/1970  . PAP SMEAR - has been 10 years- she will call for updated exam 07/05/1972  . MAMMOGRAM- given handout for breast center  07/05/2001  . COLONOSCOPY - Roselyn Reef will get you set up for this 07/05/2001  . DEXA SCAN - refer today 07/05/2016  . HEMOGLOBIN A1C - update a1c today 03/11/2017   ROS- No pertinent positives discovered in course of AWV ROS- No chest pain or shortness of breath. No headache or blurry vision.   The following were reviewed and entered/updated in epic: Past Medical History:  Diagnosis Date  . Chicken pox   . Depression    reasonable control on venlafaxine 75mg  TID. xanax #14 lasts about 2 years for anxiety portion.   . Diabetes mellitus   . Genital warts    no outbreaks since the 36s  . Hypertension    Patient Active Problem List   Diagnosis Date Noted  . Diabetes mellitus type II, controlled ([place]) 09/10/2016    Priority: High  . CAD (coronary artery disease) 09/10/2016    Priority: High  . Smoker 09/10/2016    Priority: High  . Essential hypertension 09/10/2016    Priority: Medium  . Depression  Priority: Medium  . Hyperlipidemia 06/04/2017  . Aortic atherosclerosis ([place]) 06/04/2017   Past Surgical History:  Procedure Laterality Date  . TONSILLECTOMY  1968    Family History  Problem Relation Age of Onset  . Arthritis Mother   . Ovarian cancer Mother   . AAA (abdominal aortic aneurysm) Mother   . Kidney disease Mother   . Hypothyroidism Mother   . Other Father        does not know family history for father  . Stroke Brother        retired MD. [employer]  . Arthritis Brother         hip replacement  . Hyperlipidemia Brother   . Hypertension Brother     Medications- reviewed and updated Current Outpatient Prescriptions  Medication Sig Dispense Refill  . ALPRAZolam (XANAX) 0.5 MG tablet Take 1 tablet (0.5 mg total) by mouth 2 (two) times daily as needed for anxiety. 14 tablet 0  . aspirin 81 MG tablet Take 81 mg by mouth daily.      Marland Kitchen atorvastatin (LIPITOR) 20 MG tablet Take 1 tablet (20 mg total) by mouth daily. 90 tablet 3  . cloNIDine (CATAPRES) 0.1 MG tablet Take 1 tablet (0.1 mg total) by mouth 2 (two) times daily. 180 tablet 3  . meclizine (ANTIVERT) 25 MG tablet Take 1 tablet (25 mg total) by mouth 3 (three) times daily as needed for dizziness. 30 tablet 0  . Multiple Vitamins-Minerals (MULTIVITAMIN WITH MINERALS) tablet Take 1 tablet by mouth daily. Centrum silver for women     . PRESCRIPTION MEDICATION Take 1 tablet by mouth every morning. A generic BP Medication     . valsartan-hydrochlorothiazide (DIOVAN-HCT) 160-25 MG tablet Take 1 tablet by mouth daily. 90 tablet 3  . venlafaxine (EFFEXOR) 75 MG tablet Take 1 tablet (75 mg total) by mouth 3 (three) times daily. 270 tablet 3  . metFORMIN (GLUCOPHAGE) 1000 MG tablet Take 1 tablet (1,000 mg total) by mouth 2 (two) times daily with a meal. 60 tablet 0   No current facility-administered medications for this visit.     Allergies-reviewed and updated No Known Allergies  Social History   Social History  . Marital status: Divorced    Spouse name: N/A  . Number of children: N/A  . Years of education: N/A   Social History Main Topics  . Smoking status: Current Every Day Smoker    Packs/day: 0.75    Years: 47.00  . Smokeless tobacco: Never Used  . Alcohol use No  . Drug use: No  . Sexual activity: Not Asked   Other Topics Concern  . None   Social History Narrative   Divorced. Did not ask about children.       Lives with mother   BS undergrad.    Now struggles to find work and has financial  struggles- getting on medicare has helped.    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 Does not "mow or blow"    Objective: BP 138/76   Pulse 63   Temp 98.2 F (36.8 C) (Oral)   Ht 5' 7.25" (1.708 m)   Wt 205 lb 9.6 oz (93.3 kg)   LMP  (LMP Unknown)   SpO2 95%   BMI 31.96 kg/m  Gen: NAD, resting comfortably HEENT: Mucous membranes are moist. Oropharynx normal Neck: no thyromegaly CV: RRR no murmurs rubs or gallops Lungs:  CTAB no crackles, wheeze, rhonchi Abdomen: soft/nontender/nondistended/normal bowel sounds. No rebound or guarding.  Ext: no edema Skin: warm, dry Neuro: grossly normal, moves all extremities, PERRLA  Diabetic Foot Exam - Simple   Simple Foot Form Diabetic Foot exam was performed with the following findings:  Yes 06/04/2017  2:51 PM  Visual Inspection No deformities, no ulcerations, no other skin breakdown bilaterally:  Yes Sensation Testing Intact to touch and monofilament testing bilaterally:  Yes Pulse Check Posterior Tibialis and Dorsalis pulse intact bilaterally:  Yes Comments 1+ pulses bilaterally      Assessment/Plan:  Welcome to medicare visit completed- discussed recommended screenings anddocumented any personalized health advice and referrals for preventive counseling. See AVS as well which was given to patient.   Status of chronic or acute concerns   Smoker Strongly advised cessation- declines. At least in lung cancer screening program.   Diabetes mellitus type II, controlled ([place]) S: has been controlled. On metformin alone 1g BID Exercise and diet- active as noted above. Not losing weight- discussed tightening diet up and she will work on this Lab Results  Component Value Date   HGBA1C 6.0 09/10/2016   A/P: update future fasting labs   CAD (coronary artery disease) Asymptomatic. Compliant with aspirin and statin.   Essential hypertension S:  controlled on  lisinopril hctz 20-25mg  changed to valsartan 160-25mg  at last visit due to chronic cough (but really thought due to smoking), clonidine 0.1mg  BID BP Readings from Last 3 Encounters:  06/04/17 138/76  03/14/17 130/72  01/23/17 136/80  A/P: We discussed blood pressure goal of <140/90. Continue current meds:  Push for weight loss to help lower cardiac risk and BP  Hyperlipidemia S: previously poorly controlled on no statin. No myalgias.  Lab Results  Component Value Date   LDLDIRECT 137.0 09/10/2016   A/P: able to tolerate 3x a week atorvastatin 20mg - update lipids- continue to titrate up as able   Aortic atherosclerosis ([place]) Discussed importance of quitting smoking, controlling lipids and blood pressure  4 month  Orders Placed This Encounter  Procedures  . DG Bone Density    Standing Status:   Future    Standing Expiration Date:   08/05/2018    Order Specific Question:   Reason for Exam (SYMPTOM  OR DIAGNOSIS REQUIRED)    Answer:   postmenopausal    Order Specific Question:   Preferred imaging location?    Answer:   [facility]  . [facility] DI[facility]ETES EYE EXAM    This external order was created through the Results Console.   Return precautions advised. Garret Reddish, MD

## 2017-06-04 NOTE — Assessment & Plan Note (Signed)
S: controlled on  lisinopril hctz 20-25mg  changed to valsartan 160-25mg  at last visit due to chronic cough (but really thought due to smoking), clonidine 0.1mg  BID BP Readings from Last 3 Encounters:  06/04/17 138/76  03/14/17 130/72  01/23/17 136/80  A/P: We discussed blood pressure goal of <140/90. Continue current meds:  Push for weight loss to help lower cardiac risk and BP

## 2017-06-04 NOTE — Assessment & Plan Note (Signed)
Strongly advised cessation- declines. At least in lung cancer screening program.

## 2017-06-10 ENCOUNTER — Ambulatory Visit (INDEPENDENT_AMBULATORY_CARE_PROVIDER_SITE_OTHER)
Admission: RE | Admit: 2017-06-10 | Discharge: 2017-06-10 | Disposition: A | Discharge status: Home / Self Care | Payer: Medicare Other | Source: Ambulatory Visit | Attending: Family Medicine | Admitting: Family Medicine

## 2017-06-10 ENCOUNTER — Encounter: Payer: Self-pay | Admitting: Family Medicine

## 2017-06-10 DIAGNOSIS — M8580 Other specified disorders of bone density and structure, unspecified site: Secondary | ICD-10-CM | POA: Insufficient documentation

## 2017-06-10 DIAGNOSIS — Z780 Asymptomatic menopausal state: Secondary | ICD-10-CM

## 2017-07-05 ENCOUNTER — Other Ambulatory Visit: Payer: Self-pay | Admitting: Family Medicine

## 2017-08-20 ENCOUNTER — Other Ambulatory Visit: Payer: Self-pay

## 2017-08-20 ENCOUNTER — Ambulatory Visit (INDEPENDENT_AMBULATORY_CARE_PROVIDER_SITE_OTHER): Payer: Medicare Other | Admitting: Family Medicine

## 2017-08-20 ENCOUNTER — Encounter: Payer: Self-pay | Admitting: Family Medicine

## 2017-08-20 VITALS — BP 128/72 | HR 56 | Temp 98.6°F | Ht 67.25 in | Wt 200.0 lb

## 2017-08-20 DIAGNOSIS — F3342 Major depressive disorder, recurrent, in full remission: Secondary | ICD-10-CM | POA: Diagnosis not present

## 2017-08-20 DIAGNOSIS — I10 Essential (primary) hypertension: Secondary | ICD-10-CM | POA: Diagnosis not present

## 2017-08-20 DIAGNOSIS — H8110 Benign paroxysmal vertigo, unspecified ear: Secondary | ICD-10-CM | POA: Diagnosis not present

## 2017-08-20 DIAGNOSIS — R32 Unspecified urinary incontinence: Secondary | ICD-10-CM | POA: Diagnosis not present

## 2017-08-20 DIAGNOSIS — Z23 Encounter for immunization: Secondary | ICD-10-CM | POA: Diagnosis not present

## 2017-08-20 LAB — POC URINALSYSI DIPSTICK (AUTOMATED)
BILIRUBIN UA: NEGATIVE
Clarity, UA: NEGATIVE
GLUCOSE UA: NEGATIVE
KETONES UA: NEGATIVE
Leukocytes, UA: NEGATIVE
Nitrite, UA: NEGATIVE
PH UA: 7 (ref 5.0–8.0)
RBC UA: NEGATIVE
Spec Grav, UA: 1.015 (ref 1.010–1.025)
Urobilinogen, UA: 0.2 E.U./dL

## 2017-08-20 MED ORDER — LISINOPRIL-HYDROCHLOROTHIAZIDE 20-25 MG PO TABS: 1.0000 | ORAL_TABLET | Freq: Every day | ORAL | 3 refills | Status: DC

## 2017-08-20 NOTE — Patient Instructions (Addendum)
Likely UTI- will get culture to determine the right antibiotic- should have back by Thursday or Friday and can start on the right antibiotic as long as culture confirms UTI  Switch back to lisinopril HCTZ- I sent this to [facility] can update to [facility] if that's what you prefer  Flu shot today- Jamie please also encourage Health Maintenance Due  Topic Date Due  . MAMMOGRAM  07/05/2001  . COLONOSCOPY  07/05/2001  . HEMOGLOBIN A1C - needs visit for diabetes follow up 03/11/2017  . INFLUENZA VACCINE  06/26/2017   For anxiety- call behavioral health- could also consider buspirone but I think getting off the xanax is ideal

## 2017-08-20 NOTE — Progress Notes (Signed)
Subjective:  Jessica Curry is a 66 y.o. year old very pleasant female patient who presents for/with See problem oriented charting ROS- no fever, chills, nausea, vomiting . Some vertigo now much improved.   Past Medical History-  Patient Active Problem List   Diagnosis Date Noted  . Diabetes mellitus type II, controlled ([place]) 09/10/2016    Priority: High  . CAD (coronary artery disease) 09/10/2016    Priority: High  . Smoker 09/10/2016    Priority: High  . Essential hypertension 09/10/2016    Priority: Medium  . Depression     Priority: Medium  . Osteopenia 06/10/2017  . Hyperlipidemia 06/04/2017  . Aortic atherosclerosis ([place]) 06/04/2017    Medications- reviewed and updated Current Outpatient Prescriptions  Medication Sig Dispense Refill  . ALPRAZolam (XANAX) 0.5 MG tablet Take 1 tablet (0.5 mg total) by mouth 2 (two) times daily as needed for anxiety. 14 tablet 0  . aspirin 81 MG tablet Take 81 mg by mouth daily.      Marland Kitchen atorvastatin (LIPITOR) 20 MG tablet Take 1 tablet (20 mg total) by mouth daily. 90 tablet 3  . cloNIDine (CATAPRES) 0.1 MG tablet Take 1 tablet (0.1 mg total) by mouth 2 (two) times daily. 180 tablet 3  . meclizine (ANTIVERT) 25 MG tablet Take 1 tablet (25 mg total) by mouth 3 (three) times daily as needed for dizziness. 30 tablet 0  . metFORMIN (GLUCOPHAGE) 1000 MG tablet Take 1 tablet (1,000 mg total) by mouth 2 (two) times daily with a meal. 180 tablet 3  . Multiple Vitamins-Minerals (MULTIVITAMIN WITH MINERALS) tablet Take 1 tablet by mouth daily. Centrum silver for women     . PRESCRIPTION MEDICATION Take 1 tablet by mouth every morning. A generic BP Medication     . valsartan-hydrochlorothiazide (DIOVAN-HCT) 160-25 MG tablet Take 1 tablet by mouth daily. 90 tablet 3  . venlafaxine (EFFEXOR) 75 MG tablet Take 1 tablet (75 mg total) by mouth 3 (three) times daily. 270 tablet 3   No current facility-administered medications for this visit.     Objective: BP  128/72 (BP Location: Left Arm, Patient Position: Sitting, Cuff Size: Large)   Pulse (!) 56   Temp 98.6 F (37 C) (Oral)   Ht 5' 7.25" (1.708 m)   Wt 200 lb (90.7 kg)   LMP  (LMP Unknown)   SpO2 96%   BMI 31.09 kg/m  Gen: NAD, resting comfortably CV: RRR no murmurs rubs or gallops Lungs: CTAB no crackles, wheeze, rhonchi Abdomen: soft/nontender/nondistended/normal bowel sounds. No rebound or guarding.  No suprapubic or cva tenderness Ext: no edema Skin: warm, dry Neuro: grossly normal, moves all extremities  Assessment/Plan:  Urinary incontinence, unspecified type - Plan: POCT Urinalysis Dipstick (Automated), Urine Culture S: mild discomfort when urinating and just after. Urgency when she goes with hesitancy when she gets to the toilet.notes temperature up some in evening. 3-4 weeks. Tried to drink more water to get rid of it but did not resolve. Wears minipad for leakage. Noted tinges of darker coor in panty liner- wonders if there was some slight blood in urine. Most recent GFR just above 60.  A/P: possible UTI. UA not impressive (does not appear was entered by staff- will reach out) and symptoms tolerable for 3-4 weeks. Get culture to make sure UTI and find right antibiotics. Would need to adjust due to GFR below 60 potentially.   Essential hypertension S: controlled on valsartan-hctz- now on recall (her brother who is MD requested change reportedly  because lisinopril was "old drug"). Valsartan now on recall. .  BP Readings from Last 3 Encounters:  08/20/17 128/72  06/04/17 138/76  03/14/17 130/72  A/P: We discussed blood pressure goal of <140/90. Switch back to lisinopril hctz 20-25mg  dialy with clonidine 0.1mg  BID.    Depression S: depression controlled but feels like anxiety does not have ideal control. She asks about refilling xanax- prior #14 lasted2 years. This time lasted about 10 months.  A/P: I discussed with her that I thought therapy would be more ideal than  continuing to use xanax (though has been sparing). She will consider.   BPPV (benign paroxysmal positional vertigo) S:Known BPPV- Also has had some mild vertigo with head movement but no hearing loss or ringing in the years. Short lived and resolves with rest. Meclizine helps some. This is much improved A/P: current symptoms are improving with minimal intervention other than symptomatic meclizine- will continue prn use given improvement. No red flags at present.    Future Appointments Date Time Provider [place]  10/04/2017 9:30 AM Marin Olp, MD [facility] None    Orders Placed This Encounter  Procedures  . Urine Culture  . POCT Urinalysis Dipstick (Automated)   Return precautions advised.  Garret Reddish, MD

## 2017-08-21 DIAGNOSIS — H8110 Benign paroxysmal vertigo, unspecified ear: Secondary | ICD-10-CM | POA: Insufficient documentation

## 2017-08-21 NOTE — Assessment & Plan Note (Signed)
S:Known BPPV- Also has had some mild vertigo with head movement but no hearing loss or ringing in the years. Short lived and resolves with rest. Meclizine helps some. This is much improved A/P: current symptoms are improving with minimal intervention other than symptomatic meclizine- will continue prn use given improvement. No red flags at present.

## 2017-08-21 NOTE — Assessment & Plan Note (Signed)
S: depression controlled but feels like anxiety does not have ideal control. She asks about refilling xanax- prior #14 lasted2 years. This time lasted about 10 months.  A/P: I discussed with her that I thought therapy would be more ideal than continuing to use xanax (though has been sparing). She will consider.

## 2017-08-21 NOTE — Assessment & Plan Note (Signed)
S: controlled on valsartan-hctz- now on recall (her brother who is MD requested change reportedly because lisinopril was "old drug"). Valsartan now on recall. .  BP Readings from Last 3 Encounters:  08/20/17 128/72  06/04/17 138/76  03/14/17 130/72  A/P: We discussed blood pressure goal of <140/90. Switch back to lisinopril hctz 20-25mg  dialy with clonidine 0.1mg  BID.

## 2017-08-22 LAB — URINE CULTURE
MICRO NUMBER: 81060630
SPECIMEN QUALITY:: ADEQUATE

## 2017-09-09 ENCOUNTER — Telehealth: Payer: Self-pay | Admitting: Family Medicine

## 2017-09-09 NOTE — Telephone Encounter (Signed)
MEDICATION: meclizine (ANTIVERT) 25 MG tablet  PHARMACY:   [facility], [place] [phone] (Phone) [phone] (Fax)     IS THIS A 90 DAY SUPPLY : N  IS PATIENT OUT OF MEDICATION: Y  IF NOT; HOW MUCH IS LEFT:   LAST APPOINTMENT DATE: 08/20/17  NEXT APPOINTMENT DATE:@11 /07/2017  OTHER COMMENTS: Patient stated the pharmacy had not received the OK to refill the vertigo medication.     **Let patient know to contact pharmacy at the end of the day to make sure medication is ready. **  ** Please notify patient to allow 48-72 hours to process**  **Encourage patient to contact the pharmacy for refills or they can request refills through [facility]**

## 2017-09-09 NOTE — Telephone Encounter (Signed)
Yes thanks may refill 

## 2017-09-10 ENCOUNTER — Other Ambulatory Visit: Payer: Self-pay

## 2017-09-10 MED ORDER — MECLIZINE HCL 25 MG PO TABS: 25.0000 mg | ORAL_TABLET | Freq: Three times a day (TID) | ORAL | 0 refills | Status: DC | PRN

## 2017-09-10 NOTE — Telephone Encounter (Signed)
Prescription sent to pharmacy as requested.

## 2017-09-13 ENCOUNTER — Telehealth: Payer: Self-pay | Admitting: *Deleted

## 2017-09-13 NOTE — Telephone Encounter (Signed)
Received notification from [employer] that Jessica Curry has not sent in the test. I attempted to call pt to discuss. Left VM requesting call back.

## 2017-09-24 ENCOUNTER — Other Ambulatory Visit: Payer: Self-pay | Admitting: Family Medicine

## 2017-10-04 ENCOUNTER — Ambulatory Visit: Payer: Medicare Other | Admitting: Family Medicine

## 2017-10-09 ENCOUNTER — Other Ambulatory Visit: Payer: Self-pay | Admitting: Family Medicine

## 2017-10-11 ENCOUNTER — Other Ambulatory Visit: Payer: Self-pay | Admitting: Family Medicine

## 2017-10-11 MED ORDER — ATORVASTATIN CALCIUM 20 MG PO TABS: 20.0000 mg | ORAL_TABLET | ORAL | 3 refills | Status: DC

## 2017-10-11 NOTE — Progress Notes (Signed)
Patient on statin at least 3x a week atorvastatin 20mg . Had listed as daily as we tried to titrate medication up.   [employer] states she is not taking statin which is inaccurate. As result will change rx to every other day #45 in efforts to show patients compliance with regimen.   Patient agrees.

## 2017-10-14 DIAGNOSIS — H25013 Cortical age-related cataract, bilateral: Secondary | ICD-10-CM | POA: Diagnosis not present

## 2017-10-14 DIAGNOSIS — D3191 Benign neoplasm of unspecified part of right eye: Secondary | ICD-10-CM | POA: Diagnosis not present

## 2017-10-14 DIAGNOSIS — H2513 Age-related nuclear cataract, bilateral: Secondary | ICD-10-CM | POA: Diagnosis not present

## 2017-10-14 DIAGNOSIS — E119 Type 2 diabetes mellitus without complications: Secondary | ICD-10-CM | POA: Diagnosis not present

## 2017-10-14 DIAGNOSIS — H02831 Dermatochalasis of right upper eyelid: Secondary | ICD-10-CM | POA: Diagnosis not present

## 2017-10-14 LAB — HM DIABETES EYE EXAM

## 2017-10-21 ENCOUNTER — Encounter: Payer: Self-pay | Admitting: Family Medicine

## 2017-10-23 ENCOUNTER — Other Ambulatory Visit: Payer: Self-pay | Admitting: Family Medicine

## 2017-12-02 ENCOUNTER — Ambulatory Visit (INDEPENDENT_AMBULATORY_CARE_PROVIDER_SITE_OTHER): Payer: Medicare Other | Admitting: Family Medicine

## 2017-12-02 ENCOUNTER — Encounter: Payer: Self-pay | Admitting: Family Medicine

## 2017-12-02 VITALS — BP 150/82 | HR 69 | Temp 98.4°F | Ht 67.25 in | Wt 202.6 lb

## 2017-12-02 DIAGNOSIS — I700 Atherosclerosis of aorta: Secondary | ICD-10-CM

## 2017-12-02 DIAGNOSIS — E785 Hyperlipidemia, unspecified: Secondary | ICD-10-CM | POA: Diagnosis not present

## 2017-12-02 DIAGNOSIS — I10 Essential (primary) hypertension: Secondary | ICD-10-CM

## 2017-12-02 DIAGNOSIS — E119 Type 2 diabetes mellitus without complications: Secondary | ICD-10-CM

## 2017-12-02 DIAGNOSIS — F3341 Major depressive disorder, recurrent, in partial remission: Secondary | ICD-10-CM

## 2017-12-02 DIAGNOSIS — Z23 Encounter for immunization: Secondary | ICD-10-CM

## 2017-12-02 LAB — COMPREHENSIVE METABOLIC PANEL
ALK PHOS: 109 U/L (ref 39–117)
ALT: 13 U/L (ref 0–35)
AST: 15 U/L (ref 0–37)
Albumin: 4.3 g/dL (ref 3.5–5.2)
BILIRUBIN TOTAL: 0.3 mg/dL (ref 0.2–1.2)
BUN: 16 mg/dL (ref 6–23)
CO2: 28 meq/L (ref 19–32)
CREATININE: 0.99 mg/dL (ref 0.40–1.20)
Calcium: 10.6 mg/dL — ABNORMAL HIGH (ref 8.4–10.5)
Chloride: 99 mEq/L (ref 96–112)
GFR: 59.57 mL/min — AB (ref >60.00)
GLUCOSE: 99 mg/dL (ref 70–99)
Potassium: 4.4 mEq/L (ref 3.5–5.1)
SODIUM: 134 meq/L — AB (ref 135–145)
Total Protein: 7.4 g/dL (ref 6.0–8.3)

## 2017-12-02 LAB — CBC
HCT: 40.4 % (ref 36.0–46.0)
HEMOGLOBIN: 13.5 g/dL (ref 12.0–15.0)
MCHC: 33.5 g/dL (ref 30.0–36.0)
MCV: 85.8 fl (ref 78.0–100.0)
Platelets: 408 10*3/uL — ABNORMAL HIGH (ref 150.0–400.0)
RBC: 4.71 Mil/uL (ref 3.87–5.11)
RDW: 16.3 % — AB (ref 11.5–15.5)
WBC: 8.4 10*3/uL (ref 4.0–10.5)

## 2017-12-02 LAB — HEMOGLOBIN A1C: HEMOGLOBIN A1C: 6.9 % — AB (ref 4.6–6.5)

## 2017-12-02 LAB — LDL CHOLESTEROL, DIRECT: Direct LDL: 140 mg/dL

## 2017-12-02 NOTE — Assessment & Plan Note (Signed)
S:  controlled. On no rex Lab Results  Component Value Date   HGBA1C 6.9 (H) 12/02/2017   HGBA1C 6.0 09/10/2016   A/P: a1c trending up- advised healthy eating, regular exercise Health Maintenance Due  Topic Date Due  . TETANUS/TDAP  07/05/1970  . MAMMOGRAM  07/05/2001  . COLONOSCOPY  07/05/2001  need to discuss at next visit- she has a cologuard kit at home. Need to reinforce mammogram and needs for Tdap

## 2017-12-02 NOTE — Progress Notes (Signed)
Subjective:  Jessica Curry is a 67 y.o. year old very pleasant female patient who presents for/with See problem oriented charting ROS- no chest pain. No hypoglycemia.  Admits to depressed mood and anhedonia. Has thoughts of cutting but has never acted on this  Past Medical History-  Patient Active Problem List   Diagnosis Date Noted  . Diabetes mellitus type II, controlled ([place]) 09/10/2016    Priority: High  . CAD (coronary artery disease) 09/10/2016    Priority: High  . Smoker 09/10/2016    Priority: High  . Hyperlipidemia 06/04/2017    Priority: Medium  . Essential hypertension 09/10/2016    Priority: Medium  . Depression, major, recurrent, in partial remission ([place])     Priority: Medium  . BPPV (benign paroxysmal positional vertigo) 08/21/2017    Priority: Low  . Osteopenia 06/10/2017    Priority: Low  . Aortic atherosclerosis ([place]) 06/04/2017    Medications- reviewed and updated Current Outpatient Medications  Medication Sig Dispense Refill  . ALPRAZolam (XANAX) 0.5 MG tablet Take 1 tablet (0.5 mg total) by mouth 2 (two) times daily as needed for anxiety. 14 tablet 0  . aspirin 81 MG tablet Take 81 mg by mouth daily.      Marland Kitchen atorvastatin (LIPITOR) 20 MG tablet Take 1 tablet (20 mg total) every other day by mouth. 45 tablet 3  . cloNIDine (CATAPRES) 0.1 MG tablet TAKE 1 TABLET BY MOUTH TWO  TIMES DAILY 180 tablet 2  . lisinopril-hydrochlorothiazide (PRINZIDE,ZESTORETIC) 20-25 MG tablet Take 1 tablet by mouth daily. 90 tablet 3  . meclizine (ANTIVERT) 25 MG tablet Take 1 tablet (25 mg total) by mouth 3 (three) times daily as needed for dizziness. 30 tablet 0  . metFORMIN (GLUCOPHAGE) 1000 MG tablet TAKE 1 TABLET BY MOUTH TWO  TIMES DAILY WITH A MEAL. 180 tablet 3  . Multiple Vitamins-Minerals (MULTIVITAMIN WITH MINERALS) tablet Take 1 tablet by mouth daily. Centrum silver for women     . PRESCRIPTION MEDICATION Take 1 tablet by mouth every morning. A generic BP Medication     .  venlafaxine (EFFEXOR) 75 MG tablet TAKE ONE TABLET BY MOUTH THREE TIMES DAILY 270 tablet 3   No current facility-administered medications for this visit.     Objective: BP (!) 150/82   Pulse 69   Temp 98.4 F (36.9 C) (Oral)   Ht 5' 7.25" (1.708 m)   Wt 202 lb 9.6 oz (91.9 kg)   LMP  (LMP Unknown)   SpO2 96%   BMI 31.50 kg/m  Gen: NAD, resting comfortably CV: RRR no murmurs rubs or gallops Lungs: CTAB no crackles, wheeze, rhonchi Abdomen: obese Ext: no edema Skin: warm, dry  Assessment/Plan:  Other issues 1. Had 2 bouts of having fever in afernoon and feels exhuasted for a few days. Otherwise recovers quickly- possible viral illness 2. Joined private gym and excited to start 3. Still with intermittent vertigo 4. Wants to restart work but usually has to be there for her mother  Depression, major, recurrent, in partial remission ([name]) S: previously patient has reported reasonable control on venlafaxine 69m TID. This visit I had patient complete a PHQ9 and scored at 155with 3 for SI- she states she has had thoughts of cutting since she was a girl and states she never felt wanted or of value in her home. She has some anxiety with GAD7 of 12 but we did not refill xanax last visit- usually #14 lasts several years but had been slightly more  frequent recently. She states she has done counseling in the past- is willing to retrial.  A/P: I discussed options of changing medications vs. Counseling or bothe. Patient denies medication change desire but is willing to pursue counseling- I gave her another behavioral health handout today. Advised 1 month follow up- so we can also check in on BP   Essential hypertension S: controlled in the past. Currently on Lisinopril 20-78m with clonidine 0.163mBID (changed from valsartan-hctz due to recall). Unfortunately she let herself run out of clonidine so BP today si without clonidine BP Readings from Last 3 Encounters:  12/02/17 (!) 154/86   08/20/17 128/72  06/04/17 138/76  A/P: We discussed blood pressure goal of <140/90. Continue current meds:  But restart clonidine today and she agrees to 1 month follow up  Diabetes mellitus type II, controlled ([place]:  controlled. On no rex Lab Results  Component Value Date   HGBA1C 6.9 (H) 12/02/2017   HGBA1C 6.0 09/10/2016   A/P: a1c trending up- advised healthy eating, regular exercise Health Maintenance Due  Topic Date Due  . TETANUS/TDAP  07/05/1970  . MAMMOGRAM  07/05/2001  . COLONOSCOPY  07/05/2001  need to discuss at next visit- she has a cologuard kit at home. Need to reinforce mammogram and needs for Tdap  Hyperlipidemia S: poorly controlled on last check on no statin- we started her on atorvastatin 2073mMWF- she had myalgias on statins previously  Lab Results  Component Value Date   LDLDIRECT 140.0 12/02/2017   A/P: would prefer better LDL but this is all patient has been able to tolerate so will continue current dose  Aortic atherosclerosis ([place]eds to get back [place] clonidine- need tighter BP control as we have not been able to achieve ideal lipid control.   Advised 1 month follow up  Controlled type 2 diabetes mellitus without complication, without long-term current use of insulin ([place] Plan: Hemoglobin A1c, CBC, Comprehensive metabolic panel, LDL cholesterol, direct  Need for prophylactic vaccination against Streptococcus pneumoniae (pneumococcus) - Plan: Pneumococcal polysaccharide vaccine 23-valent greater than or equal to 2yo subcutaneous/IM  Return precautions advised.  SteGarret ReddishD

## 2017-12-02 NOTE — Assessment & Plan Note (Signed)
S: poorly controlled on last check on no statin- we started her on atorvastatin 20mg   MWF- she had myalgias on statins previously  Lab Results  Component Value Date   LDLDIRECT 140.0 12/02/2017   A/P: would prefer better LDL but this is all patient has been able to tolerate so will continue current dose

## 2017-12-02 NOTE — Assessment & Plan Note (Signed)
Needs to get back on clonidine- need tighter BP control as we have not been able to achieve ideal lipid control.

## 2017-12-02 NOTE — Assessment & Plan Note (Signed)
S: controlled in the past. Currently on Lisinopril 20-25mg  with clonidine 0.1mg  BID (changed from valsartan-hctz due to recall). Unfortunately she let herself run out of clonidine so BP today si without clonidine BP Readings from Last 3 Encounters:  12/02/17 (!) 154/86  08/20/17 128/72  06/04/17 138/76  A/P: We discussed blood pressure goal of <140/90. Continue current meds:  But restart clonidine today and she agrees to 1 month follow up

## 2017-12-02 NOTE — Assessment & Plan Note (Signed)
S: previously patient has reported reasonable control on venlafaxine 75mg  TID. This visit I had patient complete a PHQ9 and scored at 39 with 3 for SI- she states she has had thoughts of cutting since she was a girl and states she never felt wanted or of value in her home. She has some anxiety with GAD7 of 12 but we did not refill xanax last visit- usually #14 lasts several years but had been slightly more frequent recently. She states she has done counseling in the past- is willing to retrial.  A/P: I discussed options of changing medications vs. Counseling or bothe. Patient denies medication change desire but is willing to pursue counseling- I gave her another behavioral health handout today. Advised 1 month follow up- so we can also check in on BP

## 2017-12-02 NOTE — Patient Instructions (Signed)
Please stop by lab before you go  No changes in medicine today but I really want you to see one of our psychologists/counselors  Lets check in 1 month from now to see how you are doing. If your thoughts of self harm become more invasive please call us or 911 immediately.

## 2017-12-12 ENCOUNTER — Other Ambulatory Visit: Payer: Self-pay | Admitting: Family Medicine

## 2017-12-31 ENCOUNTER — Encounter: Payer: Self-pay | Admitting: Nurse Practitioner

## 2017-12-31 NOTE — Progress Notes (Addendum)
CARDIOLOGY OFFICE NOTE  Date:  01/01/2018    Jessica Curry Date of Birth: 1951-03-10 Medical Record #[id number]  PCP:  Marin Olp, MD  Cardiologist:  [facility]  Chief Complaint  Patient presents with  . Coronary Artery Disease    Follow up visit - seen for Dr. Marlou Porch    History of Present Illness: Jessica Curry is a 67 y.o. female who presents today for a follow up visit.   Last seen here in December of 2017.   She has a history of known coronary artery disease, diabetes, & fatigue. She has had prior PCI/stent to the LCX in 2012 (done in [place]).  She has ongoing tobacco abuse. She has had obesity but has been successful with weight loss.   Comes in today. Here alone. Says she is doing ok. No chest pain. Her breathing is ok. Still smoking and she admits that she "just loves it". She does not wish to stop. Labs checked recently by PCP. She has been troubled by vertigo. Overall, she feels like she is doing ok and has no real concerns.   Past Medical History:  Diagnosis Date  . Chicken pox   . Depression    reasonable control on venlafaxine 75mg  TID. xanax #14 lasts about 2 years for anxiety portion.   . Diabetes mellitus   . Genital warts    no outbreaks since the 52s  . Hypertension     Past Surgical History:  Procedure Laterality Date  . TONSILLECTOMY  1968     Medications: Current Meds  Medication Sig  . ALPRAZolam (XANAX) 0.5 MG tablet Take 1 tablet (0.5 mg total) by mouth 2 (two) times daily as needed for anxiety.  Marland Kitchen aspirin 81 MG tablet Take 81 mg by mouth daily.    Marland Kitchen atorvastatin (LIPITOR) 20 MG tablet Take 1 tablet (20 mg total) every other day by mouth.  . cloNIDine (CATAPRES) 0.1 MG tablet TAKE 1 TABLET BY MOUTH TWO  TIMES DAILY  . lisinopril-hydrochlorothiazide (PRINZIDE,ZESTORETIC) 20-25 MG tablet Take 1 tablet by mouth daily.  . meclizine (ANTIVERT) 25 MG tablet Take 1 tablet (25 mg total) by mouth 3 (three) times daily as needed for dizziness.  .  metFORMIN (GLUCOPHAGE) 1000 MG tablet TAKE 1 TABLET BY MOUTH TWO  TIMES DAILY WITH A MEAL.  . Multiple Vitamins-Minerals (MULTIVITAMIN WITH MINERALS) tablet Take 1 tablet by mouth daily. Centrum silver for women   . PRESCRIPTION MEDICATION Take 1 tablet by mouth every morning. A generic BP Medication   . venlafaxine (EFFEXOR) 75 MG tablet TAKE ONE TABLET BY MOUTH THREE TIMES DAILY     Allergies: No Known Allergies  Social History: The patient  reports that she has been smoking.  She has a 35.25 pack-year smoking history. she has never used smokeless tobacco. She reports that she does not drink alcohol or use drugs.   Family History: The patient's family history includes AAA (abdominal aortic aneurysm) in her mother; Arthritis in her brother and mother; Hyperlipidemia in her brother; Hypertension in her brother; Hypothyroidism in her mother; Kidney disease in her mother; Other in her father; Ovarian cancer in her mother; Stroke in her brother.   Review of Systems: Please see the history of present illness.   Otherwise, the review of systems is positive for .   All other systems are reviewed and negative.   Physical Exam: VS:  BP 120/70 (BP Location: Left Arm, Patient Position: Sitting, Cuff Size: Normal)   Pulse (!) 56  Ht 5\' 7"  (1.702 m)   Wt 206 lb 12.8 oz (93.8 kg)   LMP  (LMP Unknown)   BMI 32.39 kg/m  .  BMI Body mass index is 32.39 kg/m.  Wt Readings from Last 3 Encounters:  01/01/18 206 lb 12.8 oz (93.8 kg)  12/02/17 202 lb 9.6 oz (91.9 kg)  08/20/17 200 lb (90.7 kg)    General: Pleasant. Well developed, well nourished and in no acute distress. Her weight is up from 194 since last visit here.   HEENT: Normal.  Neck: Supple, no JVD, carotid bruits, or masses noted.  Cardiac: Regular rate and rhythm. No murmurs, rubs, or gallops. No edema.  Respiratory:  Lungs are clear to auscultation bilaterally with normal work of breathing.  GI: Soft and nontender.  MS: No deformity  or atrophy. Gait and ROM intact.  Skin: Warm and dry. Color is normal.  Neuro:  Strength and sensation are intact and no gross focal deficits noted.  Psych: Alert, appropriate and with normal affect.   LABORATORY DATA:  EKG:  EKG is ordered today. This demonstrates sinus bradycardia  Lab Results  Component Value Date   WBC 8.4 12/02/2017   HGB 13.5 12/02/2017   HCT 40.4 12/02/2017   PLT 408.0 (H) 12/02/2017   GLUCOSE 99 12/02/2017   LDLDIRECT 140.0 12/02/2017   ALT 13 12/02/2017   AST 15 12/02/2017   NA 134 (L) 12/02/2017   K 4.4 12/02/2017   CL 99 12/02/2017   CREATININE 0.99 12/02/2017   BUN 16 12/02/2017   CO2 28 12/02/2017   HGBA1C 6.9 (H) 12/02/2017     BNP (last 3 results) No results for input(s): BNP in the last 8760 hours.  ProBNP (last 3 results) No results for input(s): PROBNP in the last 8760 hours.   Other Studies Reviewed Today:   Assessment/Plan:  1. CAD - remote PCI to the LCX from 2012 - she continues to do well.   2. Ongoing tobacco abuse - not ready to stop  3. HLD - on statin therapy  4. DM  5. Resting bradycardia - asymptomatic  Current medicines are reviewed with the patient today.  The patient does not have concerns regarding medicines other than what has been noted above.  The following changes have been made:  See above.  Labs/ tests ordered today include:    Orders Placed This Encounter  Procedures  . EKG 12-Lead     Disposition:   FU with Dr. Marlou Porch in 12 months.   Patient is agreeable to this plan and will call if any problems develop in the interim.   SignedTruitt Merle, NP  01/01/2018 12:08 PM  [facility] [address] [place], [place]  [address] Phone: [phone] Fax: [phone]

## 2018-01-01 ENCOUNTER — Encounter: Payer: Self-pay | Admitting: Nurse Practitioner

## 2018-01-01 ENCOUNTER — Ambulatory Visit: Payer: Medicare Other | Admitting: Nurse Practitioner

## 2018-01-01 VITALS — BP 120/70 | HR 56 | Ht 67.0 in | Wt 206.8 lb

## 2018-01-01 DIAGNOSIS — I10 Essential (primary) hypertension: Secondary | ICD-10-CM | POA: Diagnosis not present

## 2018-01-01 DIAGNOSIS — Z720 Tobacco use: Secondary | ICD-10-CM

## 2018-01-01 DIAGNOSIS — E7800 Pure hypercholesterolemia, unspecified: Secondary | ICD-10-CM

## 2018-01-01 DIAGNOSIS — I2510 Atherosclerotic heart disease of native coronary artery without angina pectoris: Secondary | ICD-10-CM | POA: Diagnosis not present

## 2018-01-01 NOTE — Patient Instructions (Addendum)
We will be checking the following labs today - NONE   Medication Instructions:    Continue with your current medicines.     Testing/Procedures To Be Arranged:  N/A  Follow-Up:   See Dr. Skains in one year.     Other Special Instructions:   N/A    If you need a refill on your cardiac medications before your next appointment, please call your pharmacy.   Call the [facility] office at [phone] if you have any questions, problems or concerns.      

## 2018-01-09 ENCOUNTER — Other Ambulatory Visit: Payer: Self-pay | Admitting: Family Medicine

## 2018-01-20 ENCOUNTER — Ambulatory Visit: Payer: Medicare Other | Admitting: Psychology

## 2018-01-20 DIAGNOSIS — F331 Major depressive disorder, recurrent, moderate: Secondary | ICD-10-CM | POA: Diagnosis not present

## 2018-02-03 ENCOUNTER — Ambulatory Visit: Payer: Medicare Other | Admitting: Psychology

## 2018-02-07 ENCOUNTER — Ambulatory Visit: Payer: Self-pay

## 2018-02-07 NOTE — Telephone Encounter (Signed)
Pt.'s brother who is a medical doctor also giving an assessment of the pt. States she had substernal chest pain at a "9" x 2 hours this morning. Chest pain is gone now.Has a "bronchitis type" cough as well. Pain went into the epigastric region. No radiation, no sweating. History of stent placement 6 years ago. Pt. Will go to ED for evaluation. Reason for Disposition . [1] Intermittent  chest pain or "angina" AND [2] increasing in severity or frequency  (Exception: pains lasting a few seconds) . Chest pain lasts > 5 minutes (Exceptions: chest pain occurring > 3 days ago and now asymptomatic; same as previously diagnosed heartburn and has accompanying sour taste in mouth)  Answer Assessment - Initial Assessment Questions 1. LOCATION: "Where does it hurt?"       Substernal - goes into epigastric area 2. RADIATION: "Does the pain go anywhere else?" (e.g., into neck, jaw, arms, back)     No 3. ONSET: "When did the chest pain begin?" (Minutes, hours or days)      This morning 4. PATTERN "Does the pain come and go, or has it been constant since it started?"  "Does it get worse with exertion?"      Pain is gone 5. DURATION: "How long does it last" (e.g., seconds, minutes, hours)     Lasted 2 hours 6. SEVERITY: "How bad is the pain?"  (e.g., Scale 1-10; mild, moderate, or severe)    - MILD (1-3): doesn't interfere with normal activities     - MODERATE (4-7): interferes with normal activities or awakens from sleep    - SEVERE (8-10): excruciating pain, unable to do any normal activities       9 7. CARDIAC RISK FACTORS: "Do you have any history of heart problems or risk factors for heart disease?" (e.g., prior heart attack, angina; high blood pressure, diabetes, being overweight, high cholesterol, smoking, or strong family history of heart disease)     Yes 8. PULMONARY RISK FACTORS: "Do you have any history of lung disease?"  (e.g., blood clots in lung, asthma, emphysema, birth control pills)      Smoker 9. CAUSE: "What do you think is causing the chest pain?"     Unsure 10. OTHER SYMPTOMS: "Do you have any other symptoms?" (e.g., dizziness, nausea, vomiting, sweating, fever, difficulty breathing, cough)       No 11. PREGNANCY: "Is there any chance you are pregnant?" "When was your last menstrual period?"       No  Protocols used: CHEST PAIN-A-AH

## 2018-02-07 NOTE — Telephone Encounter (Signed)
See note

## 2018-02-10 NOTE — Telephone Encounter (Signed)
Called and spoke with patient who states that she did not go to the ED. She states her "pain was really bad but she got over it." She states she does see Cardiology. I advised her to call Cardiology and let them know that she had the episode of chest pain if she wasn't going to go to the hospital. She states she will call and discuss with the PA. She will call back if anything further needed

## 2018-02-11 ENCOUNTER — Ambulatory Visit: Payer: Medicare Other | Admitting: Pulmonary Disease

## 2018-02-11 ENCOUNTER — Other Ambulatory Visit: Payer: Self-pay | Admitting: Family Medicine

## 2018-02-11 ENCOUNTER — Telehealth: Payer: Self-pay | Admitting: Nurse Practitioner

## 2018-02-11 ENCOUNTER — Encounter: Payer: Self-pay | Admitting: Pulmonary Disease

## 2018-02-11 ENCOUNTER — Other Ambulatory Visit: Payer: Self-pay | Admitting: *Deleted

## 2018-02-11 ENCOUNTER — Telehealth: Payer: Self-pay | Admitting: Pulmonary Disease

## 2018-02-11 VITALS — BP 118/74 | HR 62 | Ht 67.0 in | Wt 211.4 lb

## 2018-02-11 DIAGNOSIS — J449 Chronic obstructive pulmonary disease, unspecified: Secondary | ICD-10-CM | POA: Diagnosis not present

## 2018-02-11 DIAGNOSIS — J410 Simple chronic bronchitis: Secondary | ICD-10-CM | POA: Insufficient documentation

## 2018-02-11 DIAGNOSIS — F17200 Nicotine dependence, unspecified, uncomplicated: Secondary | ICD-10-CM

## 2018-02-11 MED ORDER — ALBUTEROL SULFATE HFA 108 (90 BASE) MCG/ACT IN AERS: 2.0000 | INHALATION_SPRAY | Freq: Four times a day (QID) | RESPIRATORY_TRACT | 0 refills | Status: DC | PRN

## 2018-02-11 MED ORDER — ALBUTEROL SULFATE HFA 108 (90 BASE) MCG/ACT IN AERS: 2.0000 | INHALATION_SPRAY | Freq: Four times a day (QID) | RESPIRATORY_TRACT | 3 refills | Status: DC | PRN

## 2018-02-11 MED ORDER — NITROGLYCERIN 0.4 MG SL SUBL: 0.4000 mg | SUBLINGUAL_TABLET | SUBLINGUAL | 3 refills | Status: DC | PRN

## 2018-02-11 NOTE — Telephone Encounter (Signed)
S/w pt did not know what office I was calling from.  Pt stated on March 15 had a mild heart attack had CP for 3 hours.  Stated pt did not go to hospital. Pt stated seen pulmonologist today stated pt has been smoking for 50 years and the pulmonologist stated was no damage to pt, pt stated would be a good time to quit smoking.   Pt did not have nitro, sent in today to pt's requested pharmacy. Moved pt's appointment up to see Cecilie Kicks, NP next week, pt stated needs a stress test and make sure you add that to pt's notes, pts brother is a doctor said pt needs a stress test.  Pt had stent placed, not at [facility], do not recall where but pt does carry a stent card.

## 2018-02-11 NOTE — Assessment & Plan Note (Signed)
Smoking cessation again emphasized is the most important intervention for her 

## 2018-02-11 NOTE — Telephone Encounter (Signed)
S/w mom pt has already left for another appt. Will try later this afternoon.

## 2018-02-11 NOTE — Progress Notes (Signed)
Subjective:    Patient ID: Jessica Curry, female    DOB: 11-Dec-1950, 67 y.o.   MRN: [id number]  HPI  Chief Complaint  Patient presents with  . Pulm Consult    Per patient, she will run a light fever in the afternoons. Her brother is a retired Tax adviser and believes she has a low grade bronchitis. Productive cough. Wheezing.    Ebony Hail is a 67 year old smoker who presents for evaluation of "bronchitis" She lives with her 59 year old mother who is also a patient in our office. She has smoked more than 50 pack years and currently about half pack per day.  She underwent lung cancer screening CT scan 03/2017 which was read as RADS 2 with a 2.8 mm nodule in the right upper lobe and a follow-up annual scan is scheduled. Her main complaint is wheezing on and off for the past 6 months.  She denies significant dyspnea on exertion and is able to climb 1 flight of stairs.  She reports an early morning cough productive of clear sputum and occasionally through the day.  She occasionally has a temperature ranging from 99-100 degrees about twice a month.  She denies frequent chest colds or recent hospitalizations. She has some vertigo since November and has gained about 15 pounds over the last 6 months.  She likes to garden and is able to work in the yard.  She is a hypertensive, diabetic and has coronary artery disease status post stent in 05/2011. She drinks 2 glasses of wine every day. She is a retired Licensed conveyancer, divorced and is on [employer].  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 Chicken pox   . Depression    reasonable control on venlafaxine 75mg  TID. xanax #14 lasts about 2 years for anxiety portion.   . Diabetes mellitus   . Genital warts    no outbreaks since the 24s  . Hypertension     Past Surgical History:  Procedure  Laterality Date  . TONSILLECTOMY  1968    No Known Allergies  Social History   Socioeconomic History  . Marital status: Divorced    Spouse name: Not on file  . Number of children: Not on file  . Years of education: Not on file  . Highest education level: Not on file  Social Needs  . Financial resource strain: Not on file  . Food insecurity - worry: Not on file  . Food insecurity - inability: Not on file  . Transportation needs - medical: Not on file  . Transportation needs - non-medical: Not on file  Occupational History  . Not on file  Tobacco Use  . Smoking status: Current Every Day Smoker    Packs/day: 0.75    Years: 47.00    Pack years: 35.25  . Smokeless tobacco: Never Used  Substance and Sexual Activity  . Alcohol use: No  . Drug use: No  . Sexual activity: Not on file  Other Topics Concern  . Not on file  Social History Narrative   Divorced. Did not ask about children.       Lives with mother   BS undergrad.    Now struggles to find work and has financial struggles- getting on medicare has helped.    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 Does not "mow or blow"      Family History  Problem Relation Age of Onset  . Arthritis Mother   . Ovarian cancer Mother   . AAA (abdominal aortic aneurysm) Mother   . Kidney disease Mother   . Hypothyroidism Mother   . Other Father        does not know family history for father  . Stroke Brother        retired MD. [employer]  . Arthritis Brother        hip replacement  . Hyperlipidemia Brother   . Hypertension Brother      Review of Systems Positive for cough, chest pain, indigestion, loss of appetite, weight gain, sneezing, anxiety, depression, joint stiffness  Constitutional: negative for anorexia, fevers and sweats  Eyes: negative for irritation, redness and visual disturbance  Ears, nose, mouth, throat, and face: negative  for earaches, epistaxis, nasal congestion and sore throat  Respiratory: negative for  dyspnea on exertion Cardiovascular: negative for chest pain,  lower extremity edema, orthopnea, palpitations and syncope  Gastrointestinal: negative for abdominal pain, constipation, diarrhea, melena, nausea and vomiting  Genitourinary:negative for dysuria, frequency and hematuria  Hematologic/lymphatic: negative for bleeding, easy bruising and lymphadenopathy  Musculoskeletal:negative for arthralgias, muscle weakness and stiff joints  Neurological: negative for coordination problems, gait problems, headaches and weakness  Endocrine: negative for diabetic symptoms including polydipsia, polyuria and weight loss       Objective:   Physical Exam  Gen. Pleasant, tall, well-nourished, in no distress, anxious affect ENT - no   thrush, no post nasal drip Neck: No JVD, no thyromegaly, no carotid bruits Lungs: no use of accessory muscles, no dullness to percussion, decreased  without rales or rhonchi  Cardiovascular: Rhythm regular, heart sounds  normal, no murmurs or gallops, no peripheral edema Abdomen: soft and non-tender, no hepatosplenomegaly, BS normal. Musculoskeletal: No deformities, no cyanosis or clubbing Neuro:  alert, non focal       Assessment & Plan:

## 2018-02-11 NOTE — Telephone Encounter (Signed)
Spoke with the pt to confirm what she wanted- 30 or 90 day supply  She requests 30 day supply with 3 rf  Rx sent to [facility] and nothing further needed per pt

## 2018-02-11 NOTE — Telephone Encounter (Signed)
New Message  Pt c/o of Chest Pain: STAT if CP now or developed within 24 hours  1. Are you having CP right now? no  2. Are you experiencing any other symptoms (ex. SOB, nausea, vomiting, sweating)?   3. How long have you been experiencing CP? 3-4 hrs on 3/15 then went away  4. Is your CP continuous or coming and going? Coming and going  5. Have you taken Nitroglycerin? No ?

## 2018-02-11 NOTE — Patient Instructions (Signed)
Lung function appears normal. Prescription for albuterol MDI 2 puffs every 6 hours as needed for wheezing   call us if you have a chest cold

## 2018-02-11 NOTE — Telephone Encounter (Signed)
Thanks for following up Jessica Curry- good advice on calling cardiology

## 2018-02-11 NOTE — Telephone Encounter (Signed)
Would make sure she is taking her aspirin. Needs to have NTG on hand - please send in if needed.  Smoking cessation advised.   If recurs - let us know - otherwise will see as planned in early April.   Burtis Junes, RN, [facility] [address] [place], [place]  [id number] [phone]

## 2018-02-11 NOTE — Assessment & Plan Note (Addendum)
Surprisingly lung function is normal and should use because she does not have COPD, no evidence of emphysema on CT scan.  Smoking cessation was emphasized is the most important intervention here She will undergo screening for lung cancer, follow-up annual in 03/2018. We discussed signs and symptoms of infective exacerbation

## 2018-02-11 NOTE — Telephone Encounter (Signed)
Pt in shower will call back.

## 2018-02-17 ENCOUNTER — Ambulatory Visit: Payer: Medicare Other | Admitting: Psychology

## 2018-02-17 DIAGNOSIS — F331 Major depressive disorder, recurrent, moderate: Secondary | ICD-10-CM

## 2018-02-18 NOTE — Progress Notes (Signed)
Cardiology Office Note   Date:  02/19/2018   ID:  Jessica Curry, DOB 10/03/1951, MRN [id number]  PCP:  Marin Olp, MD  Cardiologist:  Dr. Marlou Porch    Chief Complaint  Patient presents with  . Chest Pain      History of Present Illness: Jessica Curry is a 67 y.o. female who presents for chest pain-she called 02/11/18 with chest pain for 3 hours, described as heart attack.  We did send in NTG sl prn.    She has a history of known coronary artery disease, diabetes, & fatigue. She has had prior PCI/stent to the LCX in 2012 (done in [place]).  She has ongoing tobacco abuse. She has had obesity but has been successful with weight loss.   + tobacco--followed by pulmonary with Spirometry with normal lung function with FEV1 93% FVC 97% she will have Cancer screen in May of lungs with CT.  She is aware if she stops she may not develop severe disease.  I enforced this.   Today no further chest pain, no SOB. No lightheadedness or palpations.  She has had some vertigo.  Her chest pain was fullness epigastric to mid chest, a significant pressure.  Lasted 3 hours.  No associated nausea, sweating, or SOB.  She cannot say if similar to symptoms prior to stent.  On chest CTA last year she did have calcification in 3 coronary arteries. She did pick up the NTG we sent in.  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         Past Medical History:  Diagnosis Date  . Chicken pox   . Depression    reasonable control on venlafaxine 75mg  TID. xanax #14 lasts about 2 years for anxiety portion.   . Diabetes mellitus   . Genital warts    no outbreaks since the 77s  . Hypertension     Past Surgical History:  Procedure Laterality Date  . TONSILLECTOMY  1968     Current Outpatient Medications  Medication Sig Dispense Refill  . albuterol (PROAIR HFA) 108 (90 Base) MCG/ACT inhaler Inhale 2 puffs into the lungs every 6 (six) hours as needed for wheezing or shortness of breath. 1 Inhaler 3  . albuterol  (PROVENTIL HFA;VENTOLIN HFA) 108 (90 Base) MCG/ACT inhaler Inhale 2 puffs into the lungs every 6 (six) hours as needed for wheezing or shortness of breath. 3 Inhaler 0  . ALPRAZolam (XANAX) 0.5 MG tablet Take 1 tablet (0.5 mg total) by mouth 2 (two) times daily as needed for anxiety. 14 tablet 0  . aspirin 81 MG tablet Take 81 mg by mouth daily.      Marland Kitchen atorvastatin (LIPITOR) 20 MG tablet Take 1 tablet (20 mg total) every other day by mouth. 45 tablet 3  . cloNIDine (CATAPRES) 0.1 MG tablet TAKE 1 TABLET BY MOUTH TWO  TIMES DAILY 180 tablet 2  . lisinopril-hydrochlorothiazide (PRINZIDE,ZESTORETIC) 20-25 MG tablet Take 1 tablet by mouth daily. 90 tablet 3  . meclizine (ANTIVERT) 25 MG tablet TAKE ONE TABLET BY MOUTH THREE TIMES DAILY AS NEEDED FOR dizziness 30 tablet 1  . metFORMIN (GLUCOPHAGE) 1000 MG tablet TAKE 1 TABLET BY MOUTH TWO  TIMES DAILY WITH A MEAL. 180 tablet 3  . Multiple Vitamins-Minerals (MULTIVITAMIN WITH MINERALS) tablet Take 1 tablet by mouth daily. Centrum silver for women     . nitroGLYCERIN (NITROSTAT) 0.4 MG SL tablet Place 1 tablet (0.4 mg total) under the tongue every 5 (five) minutes as  needed for chest pain. 25 tablet 3  . PRESCRIPTION MEDICATION Take 1 tablet by mouth every morning. A generic BP Medication     . venlafaxine (EFFEXOR) 75 MG tablet TAKE ONE TABLET BY MOUTH THREE TIMES DAILY 270 tablet 3   No current facility-administered medications for this visit.     Allergies:   Patient has no known allergies.    Social History:  The patient  reports that she has been smoking cigarettes.  She has a 35.25 pack-year smoking history. She has never used smokeless tobacco. She reports that she does not drink alcohol or use drugs.   Family History:  The patient's family history includes AAA (abdominal aortic aneurysm) in her mother; Arthritis in her brother and mother; Hyperlipidemia in her brother; Hypertension in her brother; Hypothyroidism in her mother; Kidney disease in  her mother; Other in her father; Ovarian cancer in her mother; Stroke in her brother.    ROS:  General:no colds or fevers, no weight changes Skin:no rashes or ulcers HEENT:no blurred vision, no congestion CV:see HPI PUL:see HPI GI:no diarrhea constipation or melena, no indigestion GU:no hematuria, no dysuria MS:no joint pain, no claudication Neuro:no syncope, no lightheadedness + vertigo Endo:+ diabetes stable, no thyroid disease  Wt Readings from Last 3 Encounters:  02/19/18 211 lb (95.7 kg)  02/11/18 211 lb 6.4 oz (95.9 kg)  01/01/18 206 lb 12.8 oz (93.8 kg)     PHYSICAL EXAM: VS:  BP 132/80   Pulse 68   Ht 5\' 8"  (1.727 m)   Wt 211 lb (95.7 kg)   LMP  (LMP Unknown)   SpO2 97%   BMI 32.08 kg/m  , BMI Body mass index is 32.08 kg/m. General:Pleasant affect, NAD Skin:Warm and dry, brisk capillary refill HEENT:normocephalic, sclera clear, mucus membranes moist Neck:supple, no JVD, no bruits  Heart:S1S2 RRR without murmur, gallup, rub or click Lungs:clear without rales, + rhonchi, no wheezes KZL:DJTT, non tender, + BS, do not palpate liver spleen or masses Ext:no lower ext edema, 2+ pedal pulses, 2+ radial pulses Neuro:alert and oriented X 3, MAE, follows commands, + facial symmetry    EKG:  EKG is ordered today. The ekg ordered today demonstrates SR with incomplete RBBB no changes from previous.     Recent Labs: 12/02/2017: ALT 13; BUN 16; Creatinine, Ser 0.99; Hemoglobin 13.5; Platelets 408.0; Potassium 4.4; Sodium 134    Lipid Panel    Component Value Date/Time   LDLDIRECT 140.0 12/02/2017 1141       Other studies Reviewed: Additional studies/ records that were reviewed today include: previous OV notes,.   ASSESSMENT AND PLAN:  1.  Chest pain now resolved with hx of CAD and stent to LCX and coronary atherosclerosis of CTA of chest.  Will proceed with exercise myoview.  If positive will review with Dr. Marlou Porch.for cardiac cath.    2.  CAD see above  3.   HLD on statin but last LDL 140 will do stress test but will suggest higher dose.  4.  Tobacco use.  Again re enforced need to stop.    Current medicines are reviewed with the patient today.  The patient Has no concerns regarding medicines.  The following changes have been made:  See above Labs/ tests ordered today include:see above  Disposition:   FU:  see above  Signed, Cecilie Kicks, NP  02/19/2018 2:30 PM    [place], [place], [place],  [place] Phone: [phone]; Fax: [phone]

## 2018-02-19 ENCOUNTER — Encounter: Payer: Self-pay | Admitting: Cardiology

## 2018-02-19 ENCOUNTER — Encounter: Payer: Self-pay | Admitting: Nurse Practitioner

## 2018-02-19 ENCOUNTER — Ambulatory Visit (INDEPENDENT_AMBULATORY_CARE_PROVIDER_SITE_OTHER): Payer: Medicare Other | Admitting: Cardiology

## 2018-02-19 VITALS — BP 132/80 | HR 68 | Ht 68.0 in | Wt 211.0 lb

## 2018-02-19 DIAGNOSIS — E7800 Pure hypercholesterolemia, unspecified: Secondary | ICD-10-CM

## 2018-02-19 DIAGNOSIS — I25118 Atherosclerotic heart disease of native coronary artery with other forms of angina pectoris: Secondary | ICD-10-CM | POA: Diagnosis not present

## 2018-02-19 DIAGNOSIS — Z720 Tobacco use: Secondary | ICD-10-CM

## 2018-02-19 NOTE — Patient Instructions (Addendum)
Medication Instructions:  Your physician recommends that you continue on your current medications as directed. Please refer to the Current Medication list given to you today.   Labwork: None Ordered   Testing/Procedures: Your physician has requested that you have an exercise stress myoview. For further information please visit [email or web address]. Please follow instruction sheet, as given.    Follow-Up: Your physician recommends that you schedule a follow-up appointment in: 1 month with Cecilie Kicks, NP or Dr. Marlou Porch   If you need a refill on your cardiac medications before your next appointment, please call your pharmacy.   Thank you for choosing [facility]! Christen Bame, RN [phone]

## 2018-02-20 ENCOUNTER — Telehealth (HOSPITAL_COMMUNITY): Payer: Self-pay | Admitting: Radiology

## 2018-02-20 ENCOUNTER — Telehealth (HOSPITAL_COMMUNITY): Payer: Self-pay | Admitting: *Deleted

## 2018-02-20 NOTE — Telephone Encounter (Signed)
Left message with patient's mother to have patient return my call to review instructions for upcoming nuclear test. Kirstie Peri, RN

## 2018-02-20 NOTE — Telephone Encounter (Signed)
Patient given detailed instructions per Myocardial Perfusion Study Information Sheet for the test on 02/25/2018 at 7:30. Patient notified to arrive 15 minutes early and that it is imperative to arrive on time for appointment to keep from having the test rescheduled.  If you need to cancel or reschedule your appointment, please call the office within 24 hours of your appointment. . Patient verbalized understanding.EHK

## 2018-02-25 ENCOUNTER — Ambulatory Visit (HOSPITAL_COMMUNITY): Payer: Medicare Other | Attending: Cardiology

## 2018-02-25 VITALS — Ht 68.0 in | Wt 211.0 lb

## 2018-02-25 DIAGNOSIS — R110 Nausea: Secondary | ICD-10-CM

## 2018-02-25 DIAGNOSIS — I25118 Atherosclerotic heart disease of native coronary artery with other forms of angina pectoris: Secondary | ICD-10-CM

## 2018-02-25 LAB — MYOCARDIAL PERFUSION IMAGING
CHL CUP RESTING HR STRESS: 57 {beats}/min
CSEPPHR: 90 {beats}/min
LHR: 0.23
LVDIAVOL: 79 mL (ref 46–106)
LVSYSVOL: 33 mL
SDS: 5
SRS: 4
SSS: 9
TID: 1

## 2018-02-25 MED ORDER — TECHNETIUM TC 99M TETROFOSMIN IV KIT
10.9000 | PACK | Freq: Once | INTRAVENOUS | Status: AC | PRN
  Administered 2018-02-25: 10.9 via INTRAVENOUS
  Filled 2018-02-25: qty 11

## 2018-02-25 MED ORDER — TECHNETIUM TC 99M TETROFOSMIN IV KIT
32.9000 | PACK | Freq: Once | INTRAVENOUS | Status: AC | PRN
  Administered 2018-02-25: 32.9 via INTRAVENOUS
  Filled 2018-02-25: qty 33

## 2018-02-25 MED ORDER — AMINOPHYLLINE 25 MG/ML IV SOLN
75.0000 mg | Freq: Once | INTRAVENOUS | Status: AC
  Administered 2018-02-25: 75 mg via INTRAVENOUS

## 2018-02-25 MED ORDER — REGADENOSON 0.4 MG/5ML IV SOLN
0.4000 mg | Freq: Once | INTRAVENOUS | Status: AC
  Administered 2018-02-25: 0.4 mg via INTRAVENOUS

## 2018-02-26 ENCOUNTER — Ambulatory Visit: Payer: Self-pay | Admitting: Nurse Practitioner

## 2018-02-28 ENCOUNTER — Telehealth: Payer: Self-pay | Admitting: Pulmonary Disease

## 2018-02-28 MED ORDER — ALBUTEROL SULFATE HFA 108 (90 BASE) MCG/ACT IN AERS: 2.0000 | INHALATION_SPRAY | Freq: Four times a day (QID) | RESPIRATORY_TRACT | 1 refills | Status: DC | PRN

## 2018-02-28 NOTE — Telephone Encounter (Signed)
Albuterol inhaler Rx sent as 3 month supply. Nothing further is needed at this time.

## 2018-03-07 NOTE — Progress Notes (Signed)
Pt has been made aware of normal result and verbalized understanding.  jw 03/07/18

## 2018-03-26 ENCOUNTER — Ambulatory Visit (INDEPENDENT_AMBULATORY_CARE_PROVIDER_SITE_OTHER)
Admission: RE | Admit: 2018-03-26 | Discharge: 2018-03-26 | Disposition: A | Discharge status: Home / Self Care | Payer: Medicare Other | Source: Ambulatory Visit | Attending: Acute Care | Admitting: Acute Care

## 2018-03-26 ENCOUNTER — Other Ambulatory Visit: Payer: Self-pay | Admitting: Family Medicine

## 2018-03-26 DIAGNOSIS — Z87891 Personal history of nicotine dependence: Secondary | ICD-10-CM

## 2018-03-26 DIAGNOSIS — F17210 Nicotine dependence, cigarettes, uncomplicated: Principal | ICD-10-CM

## 2018-03-27 NOTE — Progress Notes (Signed)
Cardiology Office Note   Date:  03/28/2018   ID:  Jessica Curry, DOB [birth year], MRN [id number]  PCP:  Marin Olp, MD  Cardiologist:  Dr. Marlou Porch    Chief Complaint  Patient presents with  . Coronary Artery Disease      History of Present Illness: Jessica Curry is a 67 y.o. female who presents for chest pain follow up  Pt was seen for chest pain-after she called 02/11/18 with chest pain for 3 hours, described as heart attack.  We did send in NTG sl prn.    She has a history ofknown coronary artery disease, diabetes,&fatigue.She has had prior PCI/stent to the LCX in 2012 (done in [place]).She has ongoing tobacco abuse. She has had obesity but has been successful with weight loss.  + tobacco--followed by pulmonary with Spirometry with normal lung function with FEV1 93% FVC 97% she will have Cancer screen in May of lungs with CT.  She is aware if she stops she may not develop severe disease.  I enforced this.   She had a nuc study EF 58% no ischemia noted, low risk study.   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  No chest pain, she does realize this pain she had came from stress.  Increased family stress.  She is on effexor and xanax prn.  She has had her CT of chest for cancer screening.   We discussed her atorvastatin and she is only taking 3 times per week.  She could not tolerate a higher dose.      Past Medical History:  Diagnosis Date  . Chicken pox   . Depression    reasonable control on venlafaxine 75mg  TID. xanax #14 lasts about 2 years for anxiety portion.   . Diabetes mellitus   . Genital warts    no outbreaks since the 42s  . Hypertension     Past Surgical History:  Procedure Laterality Date  . TONSILLECTOMY  1968     Current Outpatient Medications  Medication Sig Dispense Refill  . albuterol (PROAIR HFA) 108 (90 Base) MCG/ACT inhaler Inhale 2 puffs into the lungs every 6 (six) hours as needed for wheezing or shortness of breath. 3 Inhaler 1  .  ALPRAZolam (XANAX) 0.5 MG tablet Take 1 tablet (0.5 mg total) by mouth 2 (two) times daily as needed for anxiety. 14 tablet 0  . aspirin 81 MG tablet Take 81 mg by mouth daily.      Marland Kitchen atorvastatin (LIPITOR) 20 MG tablet Take 1 tablet (20 mg total) every other day by mouth. (Patient taking differently: Take 20 mg by mouth 3 (three) times a week. ) 45 tablet 3  . cloNIDine (CATAPRES) 0.1 MG tablet TAKE 1 TABLET BY MOUTH TWO  TIMES DAILY 180 tablet 2  . lisinopril-hydrochlorothiazide (PRINZIDE,ZESTORETIC) 20-25 MG tablet Take 1 tablet by mouth daily. 90 tablet 3  . meclizine (ANTIVERT) 25 MG tablet TAKE ONE TABLET BY MOUTH THREE TIMES DAILY AS NEEDED FOR dizziness 30 tablet 1  . metFORMIN (GLUCOPHAGE) 1000 MG tablet TAKE 1 TABLET BY MOUTH TWO  TIMES DAILY WITH A MEAL. 180 tablet 3  . Multiple Vitamins-Minerals (MULTIVITAMIN WITH MINERALS) tablet Take 1 tablet by mouth daily. Centrum silver for women     . nitroGLYCERIN (NITROSTAT) 0.4 MG SL tablet Place 1 tablet (0.4 mg total) under the tongue every 5 (five) minutes as needed for chest pain. 25 tablet 3  . PRESCRIPTION MEDICATION Take 1 tablet by mouth every morning. A  generic BP Medication     . venlafaxine (EFFEXOR) 75 MG tablet TAKE ONE TABLET BY MOUTH THREE TIMES DAILY 270 tablet 3   No current facility-administered medications for this visit.     Allergies:   Patient has no known allergies.    Social History:  The patient  reports that she has been smoking cigarettes.  She has a 35.25 pack-year smoking history. She has never used smokeless tobacco. She reports that she does not drink alcohol or use drugs.   Family History:  The patient's family history includes AAA (abdominal aortic aneurysm) in her mother; Arthritis in her brother and mother; Hyperlipidemia in her brother; Hypertension in her brother; Hypothyroidism in her mother; Kidney disease in her mother; Other in her father; Ovarian cancer in her mother; Stroke in her brother.    ROS:   General:no colds or fevers, no weight changes Skin:no rashes or ulcers HEENT:no blurred vision, no congestion CV:see HPI PUL:see HPI GI:no diarrhea constipation or melena, no indigestion GU:no hematuria, no dysuria MS:no joint pain, no claudication Neuro:no syncope, no lightheadedness Endo:+ diabetes, no thyroid disease  Wt Readings from Last 3 Encounters:  03/28/18 212 lb (96.2 kg)  02/25/18 211 lb (95.7 kg)  02/19/18 211 lb (95.7 kg)     PHYSICAL EXAM: VS:  BP 132/74   Pulse 63   Ht 5\' 8"  (1.727 m)   Wt 212 lb (96.2 kg)   LMP  (LMP Unknown)   SpO2 95%   BMI 32.23 kg/m  , BMI Body mass index is 32.23 kg/m. General:Pleasant affect, NAD Skin:Warm and dry, brisk capillary refill HEENT:normocephalic, sclera clear, mucus membranes moist Heart:S1S2 RRR with soft systolic murmur, no gallup, rub or click Lungs:clear without rales, rhonchi, or wheezes FYB:OFBP, non tender, + BS, do not palpate liver spleen or masses Ext:no lower ext edema, 2+ pedal pulses, 2+ radial pulses Neuro:alert and oriented X 3, MAE, follows commands, + facial symmetry    EKG:  EKG is NOT ordered today.    Recent Labs: 12/02/2017: ALT 13; BUN 16; Creatinine, Ser 0.99; Hemoglobin 13.5; Platelets 408.0; Potassium 4.4; Sodium 134    Lipid Panel    Component Value Date/Time   LDLDIRECT 140.0 12/02/2017 1141       Other studies Reviewed: Additional studies/ records that were reviewed today include: . nuc study  02/2018 Study Highlights    Nuclear stress EF: 58%.  There is a small defect of mild severity present in the basal inferolateral and inferiorlocation. The defect is non-reversible and consistent with diaphragmatic attenuation artifact. No ischemia noted.  This is a low risk study.  The left ventricular ejection fraction is normal (55-65%).  There was no ST segment deviation noted during stress.     ASSESSMENT AND PLAN:  1.  CAD with hx of stent to LCX in 2012. On ASA and  statin, we discussed importance on stopping tobacco.  Follow up with Dr. Marlou Porch in 1 year.  2.  HTN controlled continue meds.  3.  HLD  On lipitor 3 X week cannot tolerate higher dose we discussed.    4.  Recent chest pain, with neg nuc study.     Current medicines are reviewed with the patient today.  The patient Has no concerns regarding medicines.  The following changes have been made:  See above Labs/ tests ordered today include:see above  Disposition:   FU:  see above  Signed, Cecilie Kicks, NP  03/28/2018 10:00 AM    [place]  [place], [place], [place] Phone: [phone]; Fax: [phone]

## 2018-03-28 ENCOUNTER — Ambulatory Visit (INDEPENDENT_AMBULATORY_CARE_PROVIDER_SITE_OTHER): Payer: Medicare Other | Admitting: Cardiology

## 2018-03-28 ENCOUNTER — Encounter: Payer: Self-pay | Admitting: Cardiology

## 2018-03-28 VITALS — BP 132/74 | HR 63 | Ht 68.0 in | Wt 212.0 lb

## 2018-03-28 DIAGNOSIS — I10 Essential (primary) hypertension: Secondary | ICD-10-CM | POA: Diagnosis not present

## 2018-03-28 DIAGNOSIS — E7800 Pure hypercholesterolemia, unspecified: Secondary | ICD-10-CM

## 2018-03-28 DIAGNOSIS — I25118 Atherosclerotic heart disease of native coronary artery with other forms of angina pectoris: Secondary | ICD-10-CM

## 2018-03-28 NOTE — Patient Instructions (Signed)
Medication Instructions:  Your physician recommends that you continue on your current medications as directed. Please refer to the Current Medication list given to you today.  Labwork: None ordered  Testing/Procedures: None ordered  Follow-Up: Your physician wants you to follow-up in: 1 YEAR WITH DR. SKAINS   You will receive a reminder letter in the mail two months in advance. If you don't receive a letter, please call our office to schedule the follow-up appointment.    Any Other Special Instructions Will Be Listed Below (If Applicable).    If you need a refill on your cardiac medications before your next appointment, please call your pharmacy.   

## 2018-04-04 ENCOUNTER — Other Ambulatory Visit: Payer: Self-pay | Admitting: Acute Care

## 2018-04-04 DIAGNOSIS — Z122 Encounter for screening for malignant neoplasm of respiratory organs: Secondary | ICD-10-CM

## 2018-04-04 DIAGNOSIS — F17210 Nicotine dependence, cigarettes, uncomplicated: Principal | ICD-10-CM

## 2018-04-29 ENCOUNTER — Other Ambulatory Visit: Payer: Self-pay | Admitting: Family Medicine

## 2018-05-08 ENCOUNTER — Other Ambulatory Visit: Payer: Self-pay | Admitting: Family Medicine

## 2018-05-26 ENCOUNTER — Encounter: Payer: Self-pay | Admitting: Family Medicine

## 2018-05-26 ENCOUNTER — Ambulatory Visit (INDEPENDENT_AMBULATORY_CARE_PROVIDER_SITE_OTHER): Payer: Medicare Other | Admitting: Family Medicine

## 2018-05-26 VITALS — BP 136/74 | HR 59 | Temp 98.3°F | Ht 68.0 in | Wt 207.6 lb

## 2018-05-26 DIAGNOSIS — E119 Type 2 diabetes mellitus without complications: Secondary | ICD-10-CM | POA: Diagnosis not present

## 2018-05-26 DIAGNOSIS — E785 Hyperlipidemia, unspecified: Secondary | ICD-10-CM

## 2018-05-26 DIAGNOSIS — Z79899 Other long term (current) drug therapy: Secondary | ICD-10-CM

## 2018-05-26 DIAGNOSIS — I10 Essential (primary) hypertension: Secondary | ICD-10-CM

## 2018-05-26 DIAGNOSIS — F3341 Major depressive disorder, recurrent, in partial remission: Secondary | ICD-10-CM | POA: Diagnosis not present

## 2018-05-26 DIAGNOSIS — R5383 Other fatigue: Secondary | ICD-10-CM

## 2018-05-26 DIAGNOSIS — Z1211 Encounter for screening for malignant neoplasm of colon: Secondary | ICD-10-CM | POA: Diagnosis not present

## 2018-05-26 NOTE — Assessment & Plan Note (Signed)
S: suspect poorly controlled on atorvastatin 20mg - worsening leg cramps on higher doses.  Lab Results  Component Value Date   LDLDIRECT 140.0 12/02/2017   A/P: update full lipid panel- will use as long as triglycerides under 400 (nonfasting lipids). Would love higher statin dose but cant tolerate due to myalgias

## 2018-05-26 NOTE — Progress Notes (Signed)
Subjective:  Jessica Curry is a 67 y.o. year old very pleasant female patient who presents for/with See problem oriented charting ROS- admits to fatigue but no chest pain or shortness of rbeath. No edema. No left arm or neck pain.    Past Medical History-  Patient Active Problem List   Diagnosis Date Noted  . Diabetes mellitus type II, controlled ([place]) 09/10/2016    Priority: High  . CAD (coronary artery disease) 09/10/2016    Priority: High  . Smoker 09/10/2016    Priority: High  . Hyperlipidemia 06/04/2017    Priority: Medium  . Essential hypertension 09/10/2016    Priority: Medium  . Depression, major, recurrent, in partial remission ([place])     Priority: Medium  . BPPV (benign paroxysmal positional vertigo) 08/21/2017    Priority: Low  . Osteopenia 06/10/2017    Priority: Low  . Simple chronic bronchitis ([place]) 02/11/2018  . Aortic atherosclerosis ([place]) 06/04/2017    Medications- reviewed and updated Current Outpatient Medications  Medication Sig Dispense Refill  . albuterol (PROAIR HFA) 108 (90 Base) MCG/ACT inhaler Inhale 2 puffs into the lungs every 6 (six) hours as needed for wheezing or shortness of breath. 3 Inhaler 1  . ALPRAZolam (XANAX) 0.5 MG tablet Take 1 tablet (0.5 mg total) by mouth 2 (two) times daily as needed for anxiety. 14 tablet 0  . aspirin 81 MG tablet Take 81 mg by mouth daily.      Marland Kitchen atorvastatin (LIPITOR) 20 MG tablet Take 1 tablet (20 mg total) every other day by mouth. (Patient taking differently: Take 20 mg by mouth 3 (three) times a week. ) 45 tablet 3  . cloNIDine (CATAPRES) 0.1 MG tablet TAKE 1 TABLET BY MOUTH TWO  TIMES DAILY 180 tablet 2  . lisinopril-hydrochlorothiazide (PRINZIDE,ZESTORETIC) 20-25 MG tablet Take 1 tablet by mouth daily. 90 tablet 3  . meclizine (ANTIVERT) 25 MG tablet TAKE ONE TABLET BY MOUTH THREE TIMES DAILY AS NEEDED FOR dizziness 30 tablet 1  . metFORMIN (GLUCOPHAGE) 1000 MG tablet TAKE 1 TABLET BY MOUTH TWO  TIMES DAILY  WITH A MEAL. 180 tablet 3  . Multiple Vitamins-Minerals (MULTIVITAMIN WITH MINERALS) tablet Take 1 tablet by mouth daily. Centrum silver for women     . PRESCRIPTION MEDICATION Take 1 tablet by mouth every morning. A generic BP Medication     . ranitidine (ZANTAC) 150 MG tablet Take 150 mg by mouth 2 (two) times daily.    Marland Kitchen venlafaxine (EFFEXOR) 75 MG tablet TAKE ONE TABLET BY MOUTH THREE TIMES DAILY 270 tablet 3  . nitroGLYCERIN (NITROSTAT) 0.4 MG SL tablet Place 1 tablet (0.4 mg total) under the tongue every 5 (five) minutes as needed for chest pain. 25 tablet 3   No current facility-administered medications for this visit.     Objective: BP 136/74 (BP Location: Left Arm, Patient Position: Sitting, Cuff Size: Large)   Pulse (!) 59   Temp 98.3 F (36.8 C) (Oral)   Ht 5\' 8"  (1.727 m)   Wt 207 lb 9.6 oz (94.2 kg)   LMP  (LMP Unknown)   SpO2 96%   BMI 31.57 kg/m  Gen: NAD, resting comfortably CV: RRR no murmurs rubs or gallops Lungs: CTAB no crackles, wheeze, rhonchi Abdomen: soft/nontender/nondistended/normal bowel sounds. Ext: no edema Skin: warm, dry  Assessment/Plan:  Other notes: 1.still with intermittent vertigo. Did better for a few months now back.  Meclizine helps only slightly- hasnt been using recently. Really intermittent.  2. Opts for colonoscopy now  over cologuard- referral placed   Fatigue S:  has had low energy for 4-5 weeks. Usually by mid afternoon- feels like she just cant walk or do anything else. No chest pain or shortness of breath. Can be with any activity- feels like she just cant move- with great effort is able to move. Feels refreshed with a nap but her mother has encouraged her not to do that. She states cat gets her up too early and she just ends up feeling tired. Sleeps from 10 30 to 5 30.  A/P: encouraged her to get an extra 30 minutes to an hour of sleep. encouarged her to try eating or to check blood sugar- she declines buying new shirps. Could be  related to depression as well though doubt- see depression section  Diabetes mellitus type II, controlled ([place]) S:  controlled hopefully on metformin 1000mg  BID- was at last visit Lab Results  Component Value Date   HGBA1C 6.9 (H) 12/02/2017   HGBA1C 6.0 09/10/2016   A/P: update a1c today, update lipid panel, update b12 due to high risk medicaiton use  Hyperlipidemia S: suspect poorly controlled on atorvastatin 20mg - worsening leg cramps on higher doses.  Lab Results  Component Value Date   LDLDIRECT 140.0 12/02/2017   A/P: update full lipid panel- will use as long as triglycerides under 400 (nonfasting lipids). Would love higher statin dose but cant tolerate due to myalgias  Essential hypertension S: controlled on clonidine 0.1mg  BID, lisinopril hctz 20-25mg  BP Readings from Last 3 Encounters:  05/26/18 136/74  03/28/18 132/74  02/19/18 132/80  A/P: We discussed blood pressure goal of <140/90. Continue current meds  Depression, major, recurrent, in partial remission ([place]) S: saw Lattie Haw here and didn't find helpful. On venlafaxine 75 mg TID. No SI.  Depression screen PHQ 2/9 12/02/2017  Decreased Interest 2  Down, Depressed, Hopeless 3  PHQ - 2 Score 5  Altered sleeping 2  Tired, decreased energy 3  Change in appetite 1  Feeling bad or failure about yourself  3  Trouble concentrating 0  Moving slowly or fidgety/restless 1  Suicidal thoughts 3  PHQ-9 Score 18  Difficult doing work/chores Very difficult  A/P: still with poor control today but phq9 at least improved from 18 to 10. she is going to see if there is another better fit for her with psychologist. Recheck 4 months sooner if thoughts of self harm. She denies any thoughts of self harm today .   Return in about 4 months (around 09/26/2018). or sooner if worsening depression or any thoughts of self harm per our discussion   Lab/Order associations: Controlled type 2 diabetes mellitus without complication, without long-term  current use of insulin ([place]) - Plan: CBC, Basic metabolic panel, Hemoglobin A1c, Lipid panel  Fatigue, unspecified type - Plan: TSH  Screening for colon cancer - Plan: Ambulatory referral to Gastroenterology  High risk medication use - Plan: Vitamin B12  Return precautions advised.  Garret Reddish, MD

## 2018-05-26 NOTE — Assessment & Plan Note (Signed)
S:  controlled hopefully on metformin 1000mg  BID- was at last visit Lab Results  Component Value Date   HGBA1C 6.9 (H) 12/02/2017   HGBA1C 6.0 09/10/2016   A/P: update a1c today, update lipid panel, update b12 due to high risk medicaiton use

## 2018-05-26 NOTE — Assessment & Plan Note (Signed)
S: saw Jessica Curry here and didn't find helpful. On venlafaxine 75 mg TID. No SI.  Depression screen PHQ 2/9 12/02/2017  Decreased Interest 2  Down, Depressed, Hopeless 3  PHQ - 2 Score 5  Altered sleeping 2  Tired, decreased energy 3  Change in appetite 1  Feeling bad or failure about yourself  3  Trouble concentrating 0  Moving slowly or fidgety/restless 1  Suicidal thoughts 3  PHQ-9 Score 18  Difficult doing work/chores Very difficult  A/P: still with poor control today but phq9 at least improved from 18 to 10. she is going to see if there is another better fit for her with psychologist. Recheck 4 months sooner if thoughts of self harm. She denies any thoughts of self harm today .

## 2018-05-26 NOTE — Assessment & Plan Note (Signed)
S: controlled on clonidine 0.1mg  BID, lisinopril hctz 20-25mg  BP Readings from Last 3 Encounters:  05/26/18 136/74  03/28/18 132/74  02/19/18 132/80  A/P: We discussed blood pressure goal of <140/90. Continue current meds

## 2018-05-26 NOTE — Patient Instructions (Addendum)
Health Maintenance Due  Topic Date Due  . TETANUS/TDAP -please go by your pharmacy and let us know when you receive this 07/05/1970  . MAMMOGRAM -please call and schedule 07/05/2001  . COLONOSCOPY - referral placed to Gastroenterology. Please be on the look out for them to call you 07/05/2001   Push for an extra 30 minutes to hour of sleep at night.   phq9 before you leave  See me in 4 months  Please stop by lab before you go

## 2018-05-27 ENCOUNTER — Encounter: Payer: Self-pay | Admitting: Gastroenterology

## 2018-05-27 LAB — LIPID PANEL
Cholesterol: 162 mg/dL (ref 0–200)
HDL: 39 mg/dL — ABNORMAL LOW (ref >39.00)
NONHDL: 123.26
Total CHOL/HDL Ratio: 4
Triglycerides: 205 mg/dL — ABNORMAL HIGH (ref 0.0–149.0)
VLDL: 41 mg/dL — ABNORMAL HIGH (ref 0.0–40.0)

## 2018-05-27 LAB — CBC
HCT: 35.1 % — ABNORMAL LOW (ref 36.0–46.0)
Hemoglobin: 11.9 g/dL — ABNORMAL LOW (ref 12.0–15.0)
MCHC: 33.9 g/dL (ref 30.0–36.0)
MCV: 83.1 fl (ref 78.0–100.0)
PLATELETS: 389 10*3/uL (ref 150.0–400.0)
RBC: 4.22 Mil/uL (ref 3.87–5.11)
RDW: 16.1 % — ABNORMAL HIGH (ref 11.5–15.5)
WBC: 6.2 10*3/uL (ref 4.0–10.5)

## 2018-05-27 LAB — BASIC METABOLIC PANEL
BUN: 17 mg/dL (ref 6–23)
CALCIUM: 10.2 mg/dL (ref 8.4–10.5)
CO2: 29 mEq/L (ref 19–32)
Chloride: 98 mEq/L (ref 96–112)
Creatinine, Ser: 0.99 mg/dL (ref 0.40–1.20)
GFR: 59.48 mL/min — AB (ref >60.00)
Glucose, Bld: 106 mg/dL — ABNORMAL HIGH (ref 70–99)
Potassium: 4.5 mEq/L (ref 3.5–5.1)
SODIUM: 135 meq/L (ref 135–145)

## 2018-05-27 LAB — VITAMIN B12: Vitamin B-12: 439 pg/mL (ref 211–911)

## 2018-05-27 LAB — TSH: TSH: 2.61 u[IU]/mL (ref 0.35–4.50)

## 2018-05-27 LAB — HEMOGLOBIN A1C: HEMOGLOBIN A1C: 7.4 % — AB (ref 4.6–6.5)

## 2018-05-27 LAB — LDL CHOLESTEROL, DIRECT: LDL DIRECT: 100 mg/dL

## 2018-05-27 NOTE — Progress Notes (Signed)
Your CBC was slightly abnormal (blood counts, infection fighting cells, platelets). You had very mild anemia. We REALLY need you to follow through with colonoscopy as new anemia in an adult we have to rule out polyps or cancer in colon  Your CMET was largely normal (kidney, liver, and electrolytes, blood sugar) for someone with diabetes. You do have a mild decrease in your kidney function- if current level of dysfunction persists would call this chronic kidney disease stage III- shouldn't affect you too much other than affectin how much we prescribe certain medicines. I would avoid medicines like ibuprofen/aleve with this though you can take tylenol.  Your cholesterol is at 100 for bad cholesterol. Would prefer under 70 but you dont tolerate the medicine above 3x a week. Healthy eating, regular exercise could help as well b12 was normal a1c has gone up over 7- Team please send in metformin 500mg  to be taken once daily in the morning and advise 4 month follow up so we can retest this Thyroid normal

## 2018-05-28 ENCOUNTER — Other Ambulatory Visit: Payer: Self-pay

## 2018-05-28 MED ORDER — GLIMEPIRIDE 2 MG PO TABS: 2.0000 mg | ORAL_TABLET | Freq: Every day | ORAL | 3 refills | Status: DC

## 2018-05-30 ENCOUNTER — Telehealth: Payer: Self-pay | Admitting: Family Medicine

## 2018-05-30 NOTE — Telephone Encounter (Signed)
Called and spoke to patient regarding lab reuslts

## 2018-05-30 NOTE — Telephone Encounter (Signed)
The patient requests a call with her lab results from her most recent visit.

## 2018-06-03 ENCOUNTER — Other Ambulatory Visit: Payer: Self-pay | Admitting: Family Medicine

## 2018-06-12 ENCOUNTER — Encounter: Payer: Self-pay | Admitting: Family Medicine

## 2018-06-13 ENCOUNTER — Other Ambulatory Visit: Payer: Self-pay | Admitting: Family Medicine

## 2018-06-13 DIAGNOSIS — Z1231 Encounter for screening mammogram for malignant neoplasm of breast: Secondary | ICD-10-CM

## 2018-07-01 DIAGNOSIS — D225 Melanocytic nevi of trunk: Secondary | ICD-10-CM | POA: Diagnosis not present

## 2018-07-01 DIAGNOSIS — L821 Other seborrheic keratosis: Secondary | ICD-10-CM | POA: Diagnosis not present

## 2018-07-01 DIAGNOSIS — D1801 Hemangioma of skin and subcutaneous tissue: Secondary | ICD-10-CM | POA: Diagnosis not present

## 2018-07-01 DIAGNOSIS — D2262 Melanocytic nevi of left upper limb, including shoulder: Secondary | ICD-10-CM | POA: Diagnosis not present

## 2018-07-01 DIAGNOSIS — D485 Neoplasm of uncertain behavior of skin: Secondary | ICD-10-CM | POA: Diagnosis not present

## 2018-07-15 ENCOUNTER — Ambulatory Visit
Admission: RE | Admit: 2018-07-15 | Discharge: 2018-07-15 | Disposition: A | Discharge status: Home / Self Care | Payer: Medicare Other | Source: Ambulatory Visit | Attending: Family Medicine | Admitting: Family Medicine

## 2018-07-15 DIAGNOSIS — Z1231 Encounter for screening mammogram for malignant neoplasm of breast: Secondary | ICD-10-CM | POA: Diagnosis not present

## 2018-07-22 ENCOUNTER — Ambulatory Visit (AMBULATORY_SURGERY_CENTER): Payer: Self-pay | Admitting: *Deleted

## 2018-07-22 ENCOUNTER — Other Ambulatory Visit: Payer: Self-pay

## 2018-07-22 ENCOUNTER — Telehealth: Payer: Self-pay | Admitting: Acute Care

## 2018-07-22 VITALS — Ht 68.0 in | Wt 208.4 lb

## 2018-07-22 DIAGNOSIS — Z1211 Encounter for screening for malignant neoplasm of colon: Secondary | ICD-10-CM

## 2018-07-22 MED ORDER — SUPREP BOWEL PREP KIT 17.5-3.13-1.6 GM/177ML PO SOLN: 1.0000 | Freq: Once | ORAL | 0 refills | Status: AC

## 2018-07-22 NOTE — Telephone Encounter (Signed)
Spoke with Kathlee Nations, charge to be corrected through insurance. Left message with patient to make aware. Will route to [facility] for follow up.

## 2018-07-22 NOTE — Progress Notes (Signed)
No egg or soy allergy known to patient  No issues with past sedation with any surgeries  or procedures, no intubation problems  No diet pills per patient No home 02 use per patient  No blood thinners per patient  Pt denies issues with constipation  No A fib or A flutter  EMMI video  Offered ,  Declined by the patient, no email.

## 2018-07-24 NOTE — Telephone Encounter (Signed)
This has been corrected and refiled to the patient's insurance.  No further action needed.

## 2018-07-30 ENCOUNTER — Ambulatory Visit (AMBULATORY_SURGERY_CENTER): Payer: Medicare Other | Admitting: Gastroenterology

## 2018-07-30 ENCOUNTER — Encounter: Payer: Self-pay | Admitting: Gastroenterology

## 2018-07-30 VITALS — BP 138/67 | HR 49 | Temp 98.4°F | Resp 14 | Ht 68.0 in | Wt 208.0 lb

## 2018-07-30 DIAGNOSIS — Z1211 Encounter for screening for malignant neoplasm of colon: Secondary | ICD-10-CM | POA: Diagnosis not present

## 2018-07-30 DIAGNOSIS — D122 Benign neoplasm of ascending colon: Secondary | ICD-10-CM | POA: Diagnosis not present

## 2018-07-30 MED ORDER — SODIUM CHLORIDE 0.9 % IV SOLN: 500.0000 mL | Freq: Once | INTRAVENOUS | Status: DC

## 2018-07-30 NOTE — Op Note (Signed)
[facility] Patient Name: Jessica Curry Procedure Date: 07/30/2018 9:41 AM MRN: [id number] Endoscopist: Mauri Pole , MD Age: 67 Referring MD:  Date of Birth: 02-Nov-1951 Gender: Female Account #: [id number] Procedure:                Colonoscopy Indications:              Screening for colorectal malignant neoplasm Medicines:                Monitored Anesthesia Care Procedure:                Pre-Anesthesia Assessment:                           - Prior to the procedure, a History and Physical                            was performed, and patient medications and                            allergies were reviewed. The patient's tolerance of                            previous anesthesia was also reviewed. The risks                            and benefits of the procedure and the sedation                            options and risks were discussed with the patient.                            All questions were answered, and informed consent                            was obtained. Prior Anticoagulants: The patient has                            taken no previous anticoagulant or antiplatelet                            agents. ASA Grade Assessment: II - A patient with                            mild systemic disease. After reviewing the risks                            and benefits, the patient was deemed in                            satisfactory condition to undergo the procedure.                           After obtaining informed consent, the colonoscope  was passed under direct vision. Throughout the                            procedure, the patient's blood pressure, pulse, and                            oxygen saturations were monitored continuously. The                            Colonoscope was introduced through the anus and                            advanced to the the terminal ileum, with                            identification of the  appendiceal orifice and IC                            valve. The colonoscopy was performed without                            difficulty. The patient tolerated the procedure                            well. The quality of the bowel preparation was                            adequate. The terminal ileum, ileocecal valve,                            appendiceal orifice, and rectum were photographed. Scope In: 9:48:00 AM Scope Out: 10:09:11 AM Scope Withdrawal Time: 0 hours 9 minutes 36 seconds  Total Procedure Duration: 0 hours 21 minutes 11 seconds  Findings:                 The perianal and digital rectal examinations were                            normal.                           A 5 mm polyp was found in the ascending colon. The                            polyp was sessile. The polyp was removed with a                            cold snare. Resection and retrieval were complete.                           A few small and large-mouthed diverticula were                            found in the sigmoid colon. There was evidence of  diverticular spasm.                           Non-bleeding internal hemorrhoids were found during                            retroflexion. The hemorrhoids were small. Complications:            No immediate complications. Estimated Blood Loss:     Estimated blood loss was minimal. Impression:               - One 5 mm polyp in the ascending colon, removed                            with a cold snare. Resected and retrieved.                           - Moderate diverticulosis in the sigmoid colon.                            There was evidence of diverticular spasm.                           - Non-bleeding internal hemorrhoids. Recommendation:           - Patient has a contact number available for                            emergencies. The signs and symptoms of potential                            delayed complications were discussed with  the                            patient. Return to normal activities tomorrow.                            Written discharge instructions were provided to the                            patient.                           - Resume previous diet.                           - Continue present medications.                           - Await pathology results.                           - Repeat colonoscopy in 5-10 years for surveillance                            based on pathology results. Mauri Pole, MD 07/30/2018 10:12:59 AM This report has been signed electronically.

## 2018-07-30 NOTE — Progress Notes (Signed)
A and O x3. Report to RN. Tolerated MAC anesthesia well.

## 2018-07-30 NOTE — Progress Notes (Signed)
Called to room to assist during endoscopic procedure.  Patient ID and intended procedure confirmed with present staff. Received instructions for my participation in the procedure from the performing physician.  

## 2018-07-30 NOTE — Patient Instructions (Signed)
YOU HAD AN ENDOSCOPIC PROCEDURE TODAY AT THE [facility]:   Refer to the procedure report that was given to you for any specific questions about what was found during the examination.  If the procedure report does not answer your questions, please call your gastroenterologist to clarify.  If you requested that your care partner not be given the details of your procedure findings, then the procedure report has been included in a sealed envelope for you to review at your convenience later.  YOU SHOULD EXPECT: Some feelings of bloating in the abdomen. Passage of more gas than usual.  Walking can help get rid of the air that was put into your GI tract during the procedure and reduce the bloating. If you had a lower endoscopy (such as a colonoscopy or flexible sigmoidoscopy) you may notice spotting of blood in your stool or on the toilet paper. If you underwent a bowel prep for your procedure, you may not have a normal bowel movement for a few days.  Please Note:  You might notice some irritation and congestion in your nose or some drainage.  This is from the oxygen used during your procedure.  There is no need for concern and it should clear up in a day or so.  SYMPTOMS TO REPORT IMMEDIATELY:   Following lower endoscopy (colonoscopy or flexible sigmoidoscopy):  Excessive amounts of blood in the stool  Significant tenderness or worsening of abdominal pains  Swelling of the abdomen that is new, acute  Fever of 100F or higher  For urgent or emergent issues, a gastroenterologist can be reached at any hour by calling [phone].   DIET:  We do recommend a small meal at first, but then you may proceed to your regular diet.  Drink plenty of fluids but you should avoid alcoholic beverages for 24 hours.  ACTIVITY:  You should plan to take it easy for the rest of today and you should NOT DRIVE or use heavy machinery until tomorrow (because of the sedation medicines used during the test).     FOLLOW UP: Our staff will call the number listed on your records the next business day following your procedure to check on you and address any questions or concerns that you may have regarding the information given to you following your procedure. If we do not reach you, we will leave a message.  However, if you are feeling well and you are not experiencing any problems, there is no need to return our call.  We will assume that you have returned to your regular daily activities without incident.  If any biopsies were taken you will be contacted by phone or by letter within the next 1-3 weeks.  Please call us at [phone] if you have not heard about the biopsies in 3 weeks.   Await for biopsy results Polyps (handout given) Diverticulosis (handout given) Hemorrhoids (handout given)   SIGNATURES/CONFIDENTIALITY: You and/or your care partner have signed paperwork which will be entered into your electronic medical record.  These signatures attest to the fact that that the information above on your After Visit Summary has been reviewed and is understood.  Full responsibility of the confidentiality of this discharge information lies with you and/or your care-partner. 

## 2018-07-30 NOTE — Progress Notes (Signed)
Pt's states no medical or surgical changes since previsit or office visit. 

## 2018-07-31 ENCOUNTER — Telehealth: Payer: Self-pay | Admitting: *Deleted

## 2018-07-31 NOTE — Telephone Encounter (Signed)
  Follow up Call-  Call back number 07/30/2018  Post procedure Call Back phone  # [phone]  Permission to leave phone message Yes  Some recent data might be hidden     Patient questions:  Do you have a fever, pain , or abdominal swelling? Yes.   Pain Score  7 * Told patient to go to ER, and she refused. States abd was "tight" last night.  Have you tolerated food without any problems? Yes.    Have you been able to return to your normal activities? No.  Do you have any questions about your discharge instructions: Diet   No. Medications  No. Follow up visit  No.  Do you have questions or concerns about your Care? Yes.    Actions: * If pain score is 4 or above:  Forwarded to [facility] Physician/ provider Notified : Harl Bowie, MD.

## 2018-07-31 NOTE — Telephone Encounter (Signed)
Updated message from previous..Patient is to call us if pain gets worse.  She absolutely refused to go to the ER to get checked out.

## 2018-07-31 NOTE — Telephone Encounter (Signed)
Called patient.  Abdominal discomfort is better this morning and she does not want to come to the emergency.  She will call with any change or worsening symptoms. She had roast beef sandwich yesterday and had abdominal cramping and fullness last night with significant discomfort.  Denies any nausea, vomiting, fever or chills.  She is passing air, resumed her activities and had a bowl of cereal with banana this morning.  Advised patient to call with any change in symptoms or worsening abdominal pain.

## 2018-08-06 ENCOUNTER — Other Ambulatory Visit: Payer: Self-pay

## 2018-08-06 ENCOUNTER — Ambulatory Visit (INDEPENDENT_AMBULATORY_CARE_PROVIDER_SITE_OTHER): Payer: Medicare Other

## 2018-08-06 ENCOUNTER — Other Ambulatory Visit: Payer: Self-pay | Admitting: Family Medicine

## 2018-08-06 VITALS — BP 106/52 | HR 58 | Ht 68.0 in | Wt 206.4 lb

## 2018-08-06 DIAGNOSIS — Z0000 Encounter for general adult medical examination without abnormal findings: Secondary | ICD-10-CM

## 2018-08-06 DIAGNOSIS — Z23 Encounter for immunization: Secondary | ICD-10-CM

## 2018-08-06 MED ORDER — METFORMIN HCL 1000 MG PO TABS: ORAL_TABLET | ORAL | 3 refills | Status: DC

## 2018-08-06 NOTE — Progress Notes (Signed)
Subjective:   Jessica Curry is a 67 y.o. female who presents for Medicare Annual (Subsequent) preventive examination.  Review of Systems:  No ROS.  Medicare Wellness Visit. Additional risk factors are reflected in the social history.  Patient lives with mom. She is divorced and single. She lives in a 2 story home. She has a Risk manager.Manages all the cleaning and does the landscaping. Her brother mows the yard. Volunteers at the [employer]. Enjoys reading. She is primary caregiver for mother. 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 Reads for an hour. Turns the lights off around 10pm. Gets up between 5:30-6am. Gets up to go to the bathroom twice a night. Feels rested most mornings when she wakes up.     Objective:     Vitals: BP (!) 106/52 (BP Location: Left Arm, Patient Position: Sitting, Cuff Size: Large)   Pulse (!) 58   Ht 5\' 8"  (1.727 m)   Wt 206 lb 6.4 oz (93.6 kg)   LMP  (LMP Unknown)   SpO2 96%   BMI 31.38 kg/m   Body mass index is 31.38 kg/m.  Advanced Directives 08/06/2018 09/14/2014  Does Patient Have a Medical Advance Directive? No No  Would patient like information on creating a medical advance directive? Yes (MAU/Ambulatory/Procedural Areas - Information given) No - patient declined information   Per the order of the prescriber Dr. Briscoe Deutscher, and with the patient's consent, 20 minutes spent discussing Medical Advance Directive. Discussed differences between Living Will and [place]. Provided packet and instructed to complete with witness signatures and to get notarized. Asked patient to drop a signed and notarized copy by the office upon completion to be scanned in to chart. Tobacco Social History   Tobacco Use  Smoking Status Current Every Day Smoker  . Packs/day: 0.50  . Years: 47.00  . Pack years: 23.50  . Types: Cigarettes  Smokeless Tobacco Never Used     Ready to quit: Not  Answered Counseling given: Not Answered   Past Medical History:  Diagnosis Date  . Allergy   . Asthma   . Chicken pox   . Depression    reasonable control on venlafaxine 75mg  TID. xanax #14 lasts about 2 years for anxiety portion.   . Diabetes mellitus   . Genital warts    no outbreaks since the 65s  . Hyperlipidemia   . Hypertension    Past Surgical History:  Procedure Laterality Date  . CORONARY ANGIOPLASTY WITH STENT PLACEMENT     2012, [place]  . DILATION AND CURETTAGE OF UTERUS    . TONSILLECTOMY  1968   Family History  Problem Relation Age of Onset  . Arthritis Mother   . Ovarian cancer Mother   . AAA (abdominal aortic aneurysm) Mother   . Kidney disease Mother   . Hypothyroidism Mother   . Other Father        does not know family history for father  . Stroke Brother        retired MD. [employer]  . Heart attack Brother   . Arthritis Brother        hip replacement  . Hyperlipidemia Brother   . Hypertension Brother   . Dementia Maternal Grandmother   . Colon cancer Neg Hx   . Esophageal cancer Neg Hx   . Rectal cancer Neg Hx   . Stomach cancer Neg Hx    Social History   Socioeconomic History  .  Marital status: Divorced    Spouse name: Not on file  . Number of children: Not on file  . Years of education: Not on file  . Highest education level: Not on file  Occupational History  . Not on file  Social Needs  . Financial resource strain: Not on file  . Food insecurity:    Worry: Not on file    Inability: Not on file  . Transportation needs:    Medical: Not on file    Non-medical: Not on file  Tobacco Use  . Smoking status: Current Every Day Smoker    Packs/day: 0.50    Years: 47.00    Pack years: 23.50    Types: Cigarettes  . Smokeless tobacco: Never Used  Substance and Sexual Activity  . Alcohol use: Yes    Alcohol/week: 4.0 standard drinks    Types: 4 Cans of beer per week  . Drug use: No  . Sexual activity: Yes  Lifestyle  . Physical  activity:    Days per week: Not on file    Minutes per session: Not on file  . Stress: Not on file  Relationships  . Social connections:    Talks on phone: Not on file    Gets together: Not on file    Attends religious service: Not on file    Active member of club or organization: Not on file    Attends meetings of clubs or organizations: Not on file    Relationship status: Not on file  Other Topics Concern  . Not on file  Social History Narrative   Divorced. Did not ask about children.       Lives with mother   BS undergrad.    Now struggles to find work and has financial struggles- getting on medicare has helped.    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 Does not "mow or blow"    Outpatient Encounter Medications as of 08/06/2018  Medication Sig  . albuterol (PROAIR HFA) 108 (90 Base) MCG/ACT inhaler Inhale 2 puffs into the lungs every 6 (six) hours as needed for wheezing or shortness of breath.  . ALPRAZolam (XANAX) 0.5 MG tablet Take 1 tablet (0.5 mg total) by mouth 2 (two) times daily as needed for anxiety.  Marland Kitchen aspirin 81 MG tablet Take 81 mg by mouth daily.    Marland Kitchen atorvastatin (LIPITOR) 20 MG tablet Take 1 tablet (20 mg total) every other day by mouth. (Patient taking differently: Take 20 mg by mouth 3 (three) times a week. )  . cloNIDine (CATAPRES) 0.1 MG tablet TAKE 1 TABLET BY MOUTH TWO  TIMES DAILY  . diphenhydrAMINE (BENADRYL) 25 MG tablet Take 25 mg by mouth every 6 (six) hours as needed.  Marland Kitchen lisinopril-hydrochlorothiazide (PRINZIDE,ZESTORETIC) 20-25 MG tablet TAKE 1 TABLET BY MOUTH  DAILY  . meclizine (ANTIVERT) 25 MG tablet TAKE ONE TABLET BY MOUTH THREE TIMES DAILY AS NEEDED FOR dizziness  . metFORMIN (GLUCOPHAGE) 1000 MG tablet TAKE 1 TABLET BY MOUTH TWO  TIMES DAILY WITH A MEAL.  . Multiple Vitamins-Minerals (MULTIVITAMIN WITH MINERALS) tablet Take 1 tablet by mouth daily. Centrum  silver for women   . ranitidine (ZANTAC) 150 MG tablet Take 150 mg by mouth 2 (two) times daily.  . raNITIdine HCl (ACID REDUCER PO) Take by mouth.  . venlafaxine (EFFEXOR) 75 MG tablet TAKE ONE TABLET BY MOUTH THREE TIMES DAILY  .  nitroGLYCERIN (NITROSTAT) 0.4 MG SL tablet Place 1 tablet (0.4 mg total) under the tongue every 5 (five) minutes as needed for chest pain. (Patient not taking: Reported on 07/22/2018)  . [DISCONTINUED] 0.9 %  sodium chloride infusion    No facility-administered encounter medications on file as of 08/06/2018.     Activities of Daily Living In your present state of health, do you have any difficulty performing the following activities: 08/06/2018  Hearing? N  Vision? N  Difficulty concentrating or making decisions? N  Walking or climbing stairs? N  Dressing or bathing? N  Doing errands, shopping? N  Preparing Food and eating ? N  Using the Toilet? N  In the past six months, have you accidently leaked urine? N  Do you have problems with loss of bowel control? N  Managing your Medications? N  Managing your Finances? N  Housekeeping or managing your Housekeeping? N  Some recent data might be hidden    Patient Care Team: Marin Olp, MD as PCP - General (Family Medicine) Jerline Pain, MD as PCP - Cardiology (Cardiology)    Assessment:   This is a routine wellness examination for [facility].  Exercise Activities and Dietary recommendations Current Exercise Habits: The patient does not participate in regular exercise at present, Exercise limited by: None identified   Breakfast: A bowl of cereal with a banana and a piece of toast, drinks a glass of water and 2 cups of coffee  Lunch; a sandwich with water to drink  Dinner: 4 taquitas with guacamole, sour cream, and salsa, water to drink  Goals    . Weight (lb) < 200 lb (90.7 kg)     Wants to lose 30lbs       Fall Risk Fall Risk  08/06/2018 12/02/2017 06/04/2017 09/10/2016  Falls in the past year? Yes  Yes Yes Yes  Number falls in past yr: 2 or more 1 2 or more 2 or more  Injury with Fall? No No Yes Yes  Risk Factor Category  - - High Fall Risk -  Follow up - - Education provided -      Depression Screen PHQ 2/9 Scores 08/06/2018 05/26/2018 12/02/2017 12/02/2017  PHQ - 2 Score 2 3 5 2   PHQ- 9 Score 11 10 18  -    Patient disclosed that she had a niece that told her that no one in the family likes her, that they all just tolerate her. She states this has made it difficult to be around her family in gatherings and she has stomach aches and excuses herself in family gatherings. We discussed her joining a [employer] to meet with friends who share common interest as she does as she did express a desire for friendships.  Cognitive Function     6CIT Screen 08/06/2018  What Year? 0 points  What month? 0 points  What time? 0 points  Count back from 20 0 points  Months in reverse 0 points  Repeat phrase 0 points  Total Score 0    Immunization History  Administered Date(s) Administered  . Influenza, High Dose Seasonal PF 08/20/2017  . Influenza-Unspecified 09/24/2016  . Pneumococcal Conjugate-13 09/10/2016  . Pneumococcal Polysaccharide-23 12/02/2017  . Zoster Recombinat (Shingrix) 06/05/2017, 09/18/2017      Screening Tests Health Maintenance  Topic Date Due  . TETANUS/TDAP  07/05/1970  . FOOT EXAM  06/04/2018  . INFLUENZA VACCINE  06/26/2018  . OPHTHALMOLOGY EXAM  10/14/2018  . HEMOGLOBIN A1C  11/26/2018  . MAMMOGRAM  07/15/2020  . COLONOSCOPY  07/30/2028  . DEXA SCAN  Completed  . PNA vac Low Risk Adult  Completed  . Hepatitis C Screening  Addressed         Plan:   Follow Up with PCP as Needed   I have personally reviewed and noted the following in the patient's chart:   . Medical and social history . Use of alcohol, tobacco or illicit drugs  . Current medications and supplements . Functional ability and status . Nutritional status . Physical activity . Advanced  directives . List of other physicians . Vitals . Screenings to include cognitive, depression, and falls . Referrals and appointments  In addition, I have reviewed and discussed with patient certain preventive protocols, quality metrics, and best practice recommendations. A written personalized care plan for preventive services as well as general preventive health recommendations were provided to patient.     [place], [place]  2/48/1859

## 2018-08-06 NOTE — Patient Instructions (Signed)
Jessica Curry , Thank you for taking time to come for your Medicare Wellness Visit. I appreciate your ongoing commitment to your health goals. Please review the following plan we discussed and let me know if I can assist you in the future.   These are the goals we discussed: Goals    . Weight (lb) < 200 lb (90.7 kg)     Wants to lose 30lbs       This is a list of the screening recommended for you and due dates:  Health Maintenance  Topic Date Due  . Tetanus Vaccine  07/05/1970  . Complete foot exam -completed today  06/04/2018  . Flu Shot -completed today 06/26/2018  . Eye exam for diabetics  10/14/2018  . Hemoglobin A1C  11/26/2018  . Mammogram  07/15/2020  . Colon Cancer Screening  07/30/2028  . DEXA scan (bone density measurement)  Completed  . Pneumonia vaccines  Completed  .  Hepatitis C: One time screening is recommended by Center for Disease Control  (CDC) for  adults born from [birth year] through 1965.   Addressed    Preventive Care for Adults  A healthy lifestyle and preventive care can promote health and wellness. Preventive health guidelines for adults include the following key practices.  . A routine yearly physical is a good way to check with your health care provider about your health and preventive screening. It is a chance to share any concerns and updates on your health and to receive a thorough exam.  . Visit your dentist for a routine exam and preventive care every 6 months. Brush your teeth twice a day and floss once a day. Good oral hygiene prevents tooth decay and gum disease.  . The frequency of eye exams is based on your age, health, family medical history, use  of contact lenses, and other factors. Follow your health care provider's recommendations for frequency of eye exams.  . Eat a healthy diet. Foods like vegetables, fruits, whole grains, low-fat dairy products, and lean protein foods contain the nutrients you need without too many calories. Decrease your  intake of foods high in solid fats, added sugars, and salt. Eat the right amount of calories for you. Get information about a proper diet from your health care provider, if necessary.  . Regular physical exercise is one of the most important things you can do for your health. Most adults should get at least 150 minutes of moderate-intensity exercise (any activity that increases your heart rate and causes you to sweat) each week. In addition, most adults need muscle-strengthening exercises on 2 or more days a week.  Silver Sneakers may be a benefit available to you. To determine eligibility, you may visit the website: www.silversneakers.com or contact program at 947-022-6913 Mon-Fri between 8AM-8PM.   . Maintain a healthy weight. The body mass index (BMI) is a screening tool to identify possible weight problems. It provides an estimate of body fat based on height and weight. Your health care provider can find your BMI and can help you achieve or maintain a healthy weight.   For adults 20 years and older: ? A BMI below 18.5 is considered underweight. ? A BMI of 18.5 to 24.9 is normal. ? A BMI of 25 to 29.9 is considered overweight. ? A BMI of 30 and above is considered obese.   . Maintain normal blood lipids and cholesterol levels by exercising and minimizing your intake of saturated fat. Eat a balanced diet with plenty of fruit and  vegetables. Blood tests for lipids and cholesterol should begin at age 52 and be repeated every 5 years. If your lipid or cholesterol levels are high, you are over 50, or you are at high risk for heart disease, you may need your cholesterol levels checked more frequently. Ongoing high lipid and cholesterol levels should be treated with medicines if diet and exercise are not working.  . If you smoke, find out from your health care provider how to quit. If you do not use tobacco, please do not start.  . If you choose to drink alcohol, please do not consume more than 2  drinks per day. One drink is considered to be 12 ounces (355 mL) of beer, 5 ounces (148 mL) of wine, or 1.5 ounces (44 mL) of liquor.  . If you are 70-37 years old, ask your health care provider if you should take aspirin to prevent strokes.  . Use sunscreen. Apply sunscreen liberally and repeatedly throughout the day. You should seek shade when your shadow is shorter than you. Protect yourself by wearing long sleeves, pants, a wide-brimmed hat, and sunglasses year round, whenever you are outdoors.  . Once a month, do a whole body skin exam, using a mirror to look at the skin on your back. Tell your health care provider of new moles, moles that have irregular borders, moles that are larger than a pencil eraser, or moles that have changed in shape or color.

## 2018-08-06 NOTE — Progress Notes (Signed)
PCP notes: Last OV 05/26/2018    Health maintenance: TDAP-will get at pharmacy, Flu-done today, Foot Exam-done today   Abnormal screenings: PHQ-9 score of 11. She has seen Trey Paula in the past. Patient did open up and discuss in depth her feelings in this visit. Please see notes under depression screening.   Patient concerns: None   Nurse concerns:None   Next PCP appt: Schedule as Needed

## 2018-08-08 NOTE — Progress Notes (Signed)
I have personally reviewed the Medicare Annual Wellness questionnaire and have noted 1. The patient's medical and social history 2. Their use of alcohol, tobacco or illicit drugs 3. Their current medications and supplements 4. The patient's functional ability including ADL's, fall risks, home safety risks and hearing or visual impairment. 5. Diet and physical activities 6. Evidence for depression or mood disorders 7. Reviewed Updated provider list, see scanned forms and [facility] Snapshot.   The patients weight, height, BMI and visual acuity have been recorded in the chart I have made referrals, counseling and provided education to the patient based review of the above and I have provided the pt with a written personalized care plan for preventive services.  I have provided the patient with a copy of your personalized plan for preventive services. Instructed to take the time to review along with their updated medication list.  Shanie Mauzy DO 

## 2018-08-11 ENCOUNTER — Encounter: Payer: Self-pay | Admitting: Gastroenterology

## 2018-08-12 ENCOUNTER — Encounter: Payer: Self-pay | Admitting: Family Medicine

## 2018-08-12 DIAGNOSIS — Z86010 Personal history of colonic polyps: Secondary | ICD-10-CM | POA: Insufficient documentation

## 2018-08-12 DIAGNOSIS — D126 Benign neoplasm of colon, unspecified: Secondary | ICD-10-CM | POA: Insufficient documentation

## 2018-08-12 DIAGNOSIS — K635 Polyp of colon: Secondary | ICD-10-CM | POA: Insufficient documentation

## 2018-08-15 ENCOUNTER — Telehealth: Payer: Self-pay | Admitting: Family Medicine

## 2018-08-15 NOTE — Telephone Encounter (Signed)
See note  Copied from [place] [id number]. Topic: General - Other >> Aug 15, 2018 11:43 AM Judyann Munson wrote: Reason for CRM: patient is calling to advise that her tentatnus shot needs to be called into [facility]. Her best contact number is [phone].

## 2018-08-19 ENCOUNTER — Other Ambulatory Visit: Payer: Self-pay

## 2018-08-19 NOTE — Patient Outreach (Signed)
[place] [facility]) Care Management  08/19/2018  Shaina Gullatt 11/19/1951 [id number]   Medication Adherence call to Mrs. Mechele Collin spoke with patient she is only taking Atorvastatin 20 mg 3 times a day because of side effects. Mrs. Stitzer is showing past due under [facility].  [place] Management Direct Dial [phone]  Fax [phone] [email or web address]

## 2018-08-20 MED ORDER — TETANUS-DIPHTH-ACELL PERTUSSIS 5-2-15.5 LF-MCG/0.5 IM SUSP: 0.5000 mL | Freq: Once | INTRAMUSCULAR | 0 refills | Status: AC

## 2018-08-20 NOTE — Telephone Encounter (Signed)
Spoke to pt told her Rx for Tdap was sent to pharmacy as requested. Pt verbalized understanding.

## 2018-08-20 NOTE — Addendum Note (Signed)
Addended by: Marian Sorrow on: 08/20/2018 08:41 AM   Modules accepted: Orders

## 2018-10-17 ENCOUNTER — Other Ambulatory Visit: Payer: Self-pay | Admitting: Family Medicine

## 2018-10-18 ENCOUNTER — Other Ambulatory Visit: Payer: Self-pay | Admitting: Family Medicine

## 2018-10-22 ENCOUNTER — Telehealth: Payer: Self-pay | Admitting: Acute Care

## 2018-10-22 NOTE — Telephone Encounter (Signed)
Jessica Curry, I spoke with pt and she states she received a collections letter for $237.01 and she is not sure why . Can you contact pt and see what she needs to do?

## 2018-10-28 NOTE — Telephone Encounter (Signed)
Will forward to Kathlee Nations to advise if she has spoken with patient and to see if message can be closed.

## 2018-11-03 NOTE — Telephone Encounter (Signed)
Attempted to call patient, left message.  

## 2018-11-03 NOTE — Telephone Encounter (Signed)
Haven't seen any follow up , just checking to see if anything else needed for thing encounter or can it be closed?  Will route to [employer] 

## 2018-11-04 NOTE — Telephone Encounter (Signed)
Kathlee Nations, please advise on update once there is one available thank you.

## 2018-11-05 ENCOUNTER — Other Ambulatory Visit: Payer: Self-pay | Admitting: Family Medicine

## 2018-11-05 DIAGNOSIS — E119 Type 2 diabetes mellitus without complications: Secondary | ICD-10-CM | POA: Diagnosis not present

## 2018-11-05 DIAGNOSIS — H25013 Cortical age-related cataract, bilateral: Secondary | ICD-10-CM | POA: Diagnosis not present

## 2018-11-05 DIAGNOSIS — H02883 Meibomian gland dysfunction of right eye, unspecified eyelid: Secondary | ICD-10-CM | POA: Diagnosis not present

## 2018-11-05 DIAGNOSIS — Z7984 Long term (current) use of oral hypoglycemic drugs: Secondary | ICD-10-CM | POA: Diagnosis not present

## 2018-11-05 DIAGNOSIS — H524 Presbyopia: Secondary | ICD-10-CM | POA: Diagnosis not present

## 2018-11-05 DIAGNOSIS — H5202 Hypermetropia, left eye: Secondary | ICD-10-CM | POA: Diagnosis not present

## 2018-11-05 DIAGNOSIS — H2513 Age-related nuclear cataract, bilateral: Secondary | ICD-10-CM | POA: Diagnosis not present

## 2018-11-05 DIAGNOSIS — H52203 Unspecified astigmatism, bilateral: Secondary | ICD-10-CM | POA: Diagnosis not present

## 2018-11-05 LAB — HM DIABETES EYE EXAM

## 2018-11-06 NOTE — Telephone Encounter (Signed)
Per Kathlee Nations, ok to close this message.

## 2018-11-18 ENCOUNTER — Encounter: Payer: Self-pay | Admitting: Family Medicine

## 2018-12-08 ENCOUNTER — Other Ambulatory Visit: Payer: Self-pay | Admitting: Family Medicine

## 2018-12-10 DIAGNOSIS — M25562 Pain in left knee: Secondary | ICD-10-CM | POA: Diagnosis not present

## 2019-01-06 ENCOUNTER — Ambulatory Visit (INDEPENDENT_AMBULATORY_CARE_PROVIDER_SITE_OTHER): Payer: Medicare Other | Admitting: Family Medicine

## 2019-01-06 ENCOUNTER — Encounter: Payer: Self-pay | Admitting: Family Medicine

## 2019-01-06 ENCOUNTER — Telehealth: Payer: Self-pay | Admitting: Family Medicine

## 2019-01-06 VITALS — BP 138/78 | HR 78 | Temp 98.6°F | Ht 68.0 in | Wt 206.2 lb

## 2019-01-06 DIAGNOSIS — R300 Dysuria: Secondary | ICD-10-CM | POA: Diagnosis not present

## 2019-01-06 DIAGNOSIS — I10 Essential (primary) hypertension: Secondary | ICD-10-CM

## 2019-01-06 DIAGNOSIS — E1159 Type 2 diabetes mellitus with other circulatory complications: Secondary | ICD-10-CM

## 2019-01-06 DIAGNOSIS — E1169 Type 2 diabetes mellitus with other specified complication: Secondary | ICD-10-CM

## 2019-01-06 DIAGNOSIS — Z6831 Body mass index (BMI) 31.0-31.9, adult: Secondary | ICD-10-CM

## 2019-01-06 DIAGNOSIS — E785 Hyperlipidemia, unspecified: Secondary | ICD-10-CM

## 2019-01-06 DIAGNOSIS — E119 Type 2 diabetes mellitus without complications: Secondary | ICD-10-CM

## 2019-01-06 DIAGNOSIS — N3001 Acute cystitis with hematuria: Secondary | ICD-10-CM

## 2019-01-06 LAB — POCT URINALYSIS DIPSTICK
Bilirubin, UA: NEGATIVE
Blood, UA: POSITIVE
Glucose, UA: NEGATIVE
Ketones, UA: NEGATIVE
Nitrite, UA: NEGATIVE
Protein, UA: POSITIVE — AB
Spec Grav, UA: 1.015 (ref 1.010–1.025)
UROBILINOGEN UA: 0.2 U/dL
pH, UA: 6.5 (ref 5.0–8.0)

## 2019-01-06 LAB — POCT GLYCOSYLATED HEMOGLOBIN (HGB A1C): Hemoglobin A1C: 6.8 % — AB (ref 4.0–5.6)

## 2019-01-06 MED ORDER — CEPHALEXIN 500 MG PO CAPS: 500.0000 mg | ORAL_CAPSULE | Freq: Three times a day (TID) | ORAL | 0 refills | Status: AC

## 2019-01-06 NOTE — Assessment & Plan Note (Signed)
S: Controlled on lisinopril-hctz 20-25mg , clonidine 0.1mg  tablet BID on repeat.  Home numbers 135-140 over 80s typically A/P:  Stable. Continue current medications.

## 2019-01-06 NOTE — Telephone Encounter (Signed)
Patient is scheduled for today at 4:20. Approved by Roselyn Reef.

## 2019-01-06 NOTE — Assessment & Plan Note (Signed)
S: Compliant with atorvastatin 20 mg-3 days a week-last lipids were controlled A/P:  Stable. Continue current medications.

## 2019-01-06 NOTE — Assessment & Plan Note (Signed)
S: Controlled on metformin 1 g twice daily-never started Amaryl Lab Results  Component Value Date   HGBA1C 6.8 (A) 01/06/2019   A/P:  Stable. Continue current medications.

## 2019-01-06 NOTE — Progress Notes (Signed)
Phone [phone]   Subjective:  Jessica Curry is a 68 y.o. year old very pleasant female patient who presents for/with See problem oriented charting  BMI monitoring- elevated BMI noted: Body mass index is 31.35 kg/m. Encouraged need for healthy eating, regular exercise, weight loss.    BMI Metric Follow Up - 01/06/19 1729      BMI Metric Follow Up-Please document annually   BMI Metric Follow Up  Education provided       Past Medical History-  Patient Active Problem List   Diagnosis Date Noted  . Diabetes mellitus type II, controlled ([place]) 09/10/2016    Priority: High  . CAD (coronary artery disease) 09/10/2016    Priority: High  . Smoker 09/10/2016    Priority: High  . Hyperlipidemia 06/04/2017    Priority: Medium  . Hypertension associated with diabetes ([place]) 09/10/2016    Priority: Medium  . Depression, major, recurrent, in partial remission ([place])     Priority: Medium  . BPPV (benign paroxysmal positional vertigo) 08/21/2017    Priority: Low  . Osteopenia 06/10/2017    Priority: Low  . History of colon polyps 08/12/2018  . Simple chronic bronchitis ([place]) 02/11/2018  . Aortic atherosclerosis ([place]) 06/04/2017    Medications- reviewed and updated Current Outpatient Medications  Medication Sig Dispense Refill  . albuterol (PROAIR HFA) 108 (90 Base) MCG/ACT inhaler Inhale 2 puffs into the lungs every 6 (six) hours as needed for wheezing or shortness of breath. 3 Inhaler 1  . ALPRAZolam (XANAX) 0.5 MG tablet Take 1 tablet (0.5 mg total) by mouth 2 (two) times daily as needed for anxiety. 14 tablet 0  . aspirin 81 MG tablet Take 81 mg by mouth daily.      Marland Kitchen atorvastatin (LIPITOR) 20 MG tablet Take 1 tablet (20 mg total) by mouth 3 (three) times a week. 13 tablet 5  . cloNIDine (CATAPRES) 0.1 MG tablet TAKE 1 TABLET BY MOUTH TWO  TIMES DAILY 180 tablet 2  . diphenhydrAMINE (BENADRYL) 25 MG tablet Take 25 mg by mouth every 6 (six) hours as needed.    Marland Kitchen  lisinopril-hydrochlorothiazide (PRINZIDE,ZESTORETIC) 20-25 MG tablet TAKE 1 TABLET BY MOUTH  DAILY 90 tablet 3  . meclizine (ANTIVERT) 25 MG tablet TAKE ONE TABLET BY MOUTH THREE TIMES DAILY AS NEEDED FOR dizziness 30 tablet 1  . metFORMIN (GLUCOPHAGE) 1000 MG tablet TAKE 1 TABLET BY MOUTH TWO  TIMES DAILY WITH A MEAL. 180 tablet 3  . Multiple Vitamins-Minerals (HAIR SKIN AND NAILS FORMULA PO) Take by mouth.    . Multiple Vitamins-Minerals (MULTIVITAMIN WITH MINERALS) tablet Take 1 tablet by mouth daily. Centrum silver for women     . ranitidine (ZANTAC) 150 MG tablet Take 150 mg by mouth 2 (two) times daily.    . raNITIdine HCl (ACID REDUCER PO) Take by mouth.    . venlafaxine (EFFEXOR) 75 MG tablet TAKE ONE TABLET BY MOUTH THREE TIMES DAILY 270 tablet 3  . cephALEXin (KEFLEX) 500 MG capsule Take 1 capsule (500 mg total) by mouth 3 (three) times daily for 7 days. 21 capsule 0  . nitroGLYCERIN (NITROSTAT) 0.4 MG SL tablet Place 1 tablet (0.4 mg total) under the tongue every 5 (five) minutes as needed for chest pain. (Patient not taking: Reported on 07/22/2018) 25 tablet 3   No current facility-administered medications for this visit.      Objective:  BP 138/78   Pulse 78   Temp 98.6 F (37 C) (Oral)   Ht 5\' 8"  (  1.727 m)   Wt 206 lb 3.2 oz (93.5 kg)   LMP  (LMP Unknown)   SpO2 96%   BMI 31.35 kg/m  Gen: NAD, resting comfortably CV: RRR no murmurs rubs or gallops Lungs: CTAB no crackles, wheeze, rhonchi Ext: no edema Skin: warm, dry  Results for orders placed or performed in visit on 01/06/19 (from the past 24 hour(s))  POCT Urinalysis Dipstick     Status: Abnormal   Collection Time: 01/06/19  4:44 PM  Result Value Ref Range   Color, UA Yellow    Clarity, UA Clear    Glucose, UA Negative Negative   Bilirubin, UA Negative    Ketones, UA Negative    Spec Grav, UA 1.015 1.010 - 1.025   Blood, UA Positive    pH, UA 6.5 5.0 - 8.0   Protein, UA Positive (A) Negative   Urobilinogen,  UA 0.2 0.2 or 1.0 E.U./dL   Nitrite, UA Negative    Leukocytes, UA 4+ (A) Negative   Appearance     Odor    POCT glycosylated hemoglobin (Hb A1C)     Status: Abnormal   Collection Time: 01/06/19  4:45 PM  Result Value Ref Range   Hemoglobin A1C 6.8 (A) 4.0 - 5.6 %   HbA1c POC (<> result, manual entry)     HbA1c, POC (prediabetic range)     HbA1c, POC (controlled diabetic range)        Assessment and Plan   Concern for UTI S: Patients symptoms started 3 weeks ago. Seemed to start after a round of constipation a few weeks ago Complains of dysuria: yes- bad burning/pulling severe sensation in groin; polyuria: yes; nocturia: yes; urgency: yes. Suprapubic aching also noted.  Symptoms are getting worse.  ROS- no fever, chills, nausea, vomiting. Does have flank pain. No blood in urine.  A/P: UA concerning for UTI. Will get culture. Empiric treatment with: keflex TID Patient to follow up if new or worsening symptoms or failure to improve.   Diabetes mellitus type II, controlled ([place]) S: Controlled on metformin 1 g twice daily-never started Amaryl Lab Results  Component Value Date   HGBA1C 6.8 (A) 01/06/2019   A/P:  Stable. Continue current medications.    Hypertension associated with diabetes ([place]) S: Controlled on lisinopril-hctz 20-25mg , clonidine 0.1mg  tablet BID on repeat.  Home numbers 135-140 over 80s typically A/P:  Stable. Continue current medications.    Hyperlipidemia associated with type 2 diabetes mellitus ([place]) S: Compliant with atorvastatin 20 mg-3 days a week-last lipids were controlled A/P:  Stable. Continue current medications.    Other notes: 1.considering swimming. Knee is bad. Down 1 lb from July. Considering weight watchers  Future Appointments  Date Time Provider [place]  09/08/2019 10:00 AM [facility]   Advised 69-month physical  Lab/Order associations: Dysuria - Plan: POCT Urinalysis Dipstick, Urine  Culture  Controlled type 2 diabetes mellitus without complication, without long-term current use of insulin ([facility]) - Plan: POCT glycosylated hemoglobin (Hb A1C), CANCELED: Hemoglobin A1c  Hypertension associated with diabetes ([place])  Hyperlipidemia, unspecified hyperlipidemia type  Meds ordered this encounter  Medications  . cephALEXin (KEFLEX) 500 MG capsule    Sig: Take 1 capsule (500 mg total) by mouth 3 (three) times daily for 7 days.    Dispense:  21 capsule    Refill:  0   Return precautions advised.  Garret Reddish, MD

## 2019-01-06 NOTE — Telephone Encounter (Signed)
See note  Copied from [place] [phone]. Topic: Appointment Scheduling - Scheduling Inquiry for Clinic >> Jan 06, 2019 10:16 AM Jessica Curry wrote: Reason for CRM:pt has burning when she urinates and pain in her belly. Pt lives about 15 min away. Please advise

## 2019-01-06 NOTE — Patient Instructions (Addendum)
Urine concerning for UTI. Will get culture. Empiric treatment with: keflex three times a day  Patient to follow up if new or worsening symptoms or failure to improve.   congrats on excellent a1c- lets schedule a visit 4 months from now to check in    Urinary Tract Infection, Adult  A urinary tract infection (UTI) is an infection of any part of the urinary tract. The urinary tract includes the kidneys, ureters, bladder, and urethra. These organs make, store, and get rid of urine in the body. Your health care provider may use other names to describe the infection. An upper UTI affects the ureters and kidneys (pyelonephritis). A lower UTI affects the bladder (cystitis) and urethra (urethritis). What are the causes? Most urinary tract infections are caused by bacteria in your genital area, around the entrance to your urinary tract (urethra). These bacteria grow and cause inflammation of your urinary tract. What increases the risk? You are more likely to develop this condition if:  You have a urinary catheter that stays in place (indwelling).  You are not able to control when you urinate or have a bowel movement (you have incontinence).  You are female and you: ? Use a spermicide or diaphragm for birth control. ? Have low estrogen levels. ? Are pregnant.  You have certain genes that increase your risk (genetics).  You are sexually active.  You take antibiotic medicines.  You have a condition that causes your flow of urine to slow down, such as: ? An enlarged prostate, if you are female. ? Blockage in your urethra (stricture). ? A kidney stone. ? A nerve condition that affects your bladder control (neurogenic bladder). ? Not getting enough to drink, or not urinating often.  You have certain medical conditions, such as: ? Diabetes. ? A weak disease-fighting system (immunesystem). ? Sickle cell disease. ? Gout. ? Spinal cord injury. What are the signs or symptoms? Symptoms of this  condition include:  Needing to urinate right away (urgently).  Frequent urination or passing small amounts of urine frequently.  Pain or burning with urination.  Blood in the urine.  Urine that smells bad or unusual.  Trouble urinating.  Cloudy urine.  Vaginal discharge, if you are female.  Pain in the abdomen or the lower back. You may also have:  Vomiting or a decreased appetite.  Confusion.  Irritability or tiredness.  A fever.  Diarrhea. The first symptom in older adults may be confusion. In some cases, they may not have any symptoms until the infection has worsened. How is this diagnosed? This condition is diagnosed based on your medical history and a physical exam. You may also have other tests, including:  Urine tests.  Blood tests.  Tests for sexually transmitted infections (STIs). If you have had more than one UTI, a cystoscopy or imaging studies may be done to determine the cause of the infections. How is this treated? Treatment for this condition includes:  Antibiotic medicine.  Over-the-counter medicines to treat discomfort.  Drinking enough water to stay hydrated. If you have frequent infections or have other conditions such as a kidney stone, you may need to see a health care provider who specializes in the urinary tract (urologist). In rare cases, urinary tract infections can cause sepsis. Sepsis is a life-threatening condition that occurs when the body responds to an infection. Sepsis is treated in the hospital with IV antibiotics, fluids, and other medicines. Follow these instructions at home:  Medicines  Take over-the-counter and prescription medicines only  as told by your health care provider.  If you were prescribed an antibiotic medicine, take it as told by your health care provider. Do not stop using the antibiotic even if you start to feel better. General instructions  Make sure you: ? Empty your bladder often and completely. Do not  hold urine for long periods of time. ? Empty your bladder after sex. ? Wipe from front to back after a bowel movement if you are female. Use each tissue one time when you wipe.  Drink enough fluid to keep your urine pale yellow.  Keep all follow-up visits as told by your health care provider. This is important. Contact a health care provider if:  Your symptoms do not get better after 1-2 days.  Your symptoms go away and then return. Get help right away if you have:  Severe pain in your back or your lower abdomen.  A fever.  Nausea or vomiting. Summary  A urinary tract infection (UTI) is an infection of any part of the urinary tract, which includes the kidneys, ureters, bladder, and urethra.  Most urinary tract infections are caused by bacteria in your genital area, around the entrance to your urinary tract (urethra).  Treatment for this condition often includes antibiotic medicines.  If you were prescribed an antibiotic medicine, take it as told by your health care provider. Do not stop using the antibiotic even if you start to feel better.  Keep all follow-up visits as told by your health care provider. This is important. This information is not intended to replace advice given to you by your health care provider. Make sure you discuss any questions you have with your health care provider. Document Released: 08/22/2005 Document Revised: 05/22/2018 Document Reviewed: 05/22/2018 Elsevier Interactive Patient Education  2019 [employer].

## 2019-01-08 LAB — URINE CULTURE
MICRO NUMBER:: 179958
SPECIMEN QUALITY:: ADEQUATE

## 2019-01-30 ENCOUNTER — Ambulatory Visit: Payer: Medicare Other | Admitting: Cardiology

## 2019-01-30 ENCOUNTER — Encounter: Payer: Self-pay | Admitting: Cardiology

## 2019-01-30 VITALS — BP 122/60 | HR 77 | Ht 68.0 in | Wt 204.8 lb

## 2019-01-30 DIAGNOSIS — Z720 Tobacco use: Secondary | ICD-10-CM | POA: Diagnosis not present

## 2019-01-30 DIAGNOSIS — I25118 Atherosclerotic heart disease of native coronary artery with other forms of angina pectoris: Secondary | ICD-10-CM | POA: Diagnosis not present

## 2019-01-30 DIAGNOSIS — I10 Essential (primary) hypertension: Secondary | ICD-10-CM | POA: Diagnosis not present

## 2019-01-30 NOTE — Patient Instructions (Signed)
Medication Instructions:  Your physician recommends that you continue on your current medications as directed. Please refer to the Current Medication list given to you today.  If you need a refill on your cardiac medications before your next appointment, please call your pharmacy.   Lab work: None ordered If you have labs (blood work) drawn today and your tests are completely normal, you will receive your results only by: Marland Kitchen MyChart Message (if you have MyChart) OR . A paper copy in the mail If you have any lab test that is abnormal or we need to change your treatment, we will call you to review the results.  Testing/Procedures: None ordered  Follow-Up: At [facility], you and your health needs are our priority.  As part of our continuing mission to provide you with exceptional heart care, we have created designated Provider Care Teams.  These Care Teams include your primary Cardiologist (physician) and Advanced Practice Providers (APPs -  Physician Assistants and Nurse Practitioners) who all work together to provide you with the care you need, when you need it. . You will need a follow up appointment in 12 months  Please call our office 2 months in advance to schedule this appointment.  You may see Dr. Candee Furbish, MD or one of the following Advanced Practice Providers on your designated Care Team:   . Truitt Merle, NP . Cecilie Kicks, NP . Kathyrn Drown, NP   Any Other Special Instructions Will Be Listed Below (If Applicable).
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
## 2019-03-12 ENCOUNTER — Other Ambulatory Visit: Payer: Self-pay

## 2019-03-12 NOTE — Patient Outreach (Signed)
[place] [facility]) Care Management  03/12/2019  Megham Dwyer [birth year] [id number]   Duplication pick by mistake   [employer] Management Direct Dial [phone]  Fax [phone] [email or web address]

## 2019-04-02 ENCOUNTER — Telehealth: Payer: Self-pay

## 2019-04-02 ENCOUNTER — Ambulatory Visit (INDEPENDENT_AMBULATORY_CARE_PROVIDER_SITE_OTHER): Payer: Medicare Other | Admitting: Family Medicine

## 2019-04-02 ENCOUNTER — Encounter: Payer: Self-pay | Admitting: Family Medicine

## 2019-04-02 VITALS — BP 135/80 | HR 84 | Temp 97.6°F | Ht 68.0 in

## 2019-04-02 DIAGNOSIS — I700 Atherosclerosis of aorta: Secondary | ICD-10-CM | POA: Diagnosis not present

## 2019-04-02 DIAGNOSIS — R300 Dysuria: Secondary | ICD-10-CM | POA: Diagnosis not present

## 2019-04-02 DIAGNOSIS — J410 Simple chronic bronchitis: Secondary | ICD-10-CM | POA: Diagnosis not present

## 2019-04-02 DIAGNOSIS — F3342 Major depressive disorder, recurrent, in full remission: Secondary | ICD-10-CM

## 2019-04-02 LAB — POCT URINALYSIS DIPSTICK
Bilirubin, UA: NEGATIVE
Blood, UA: NEGATIVE
Glucose, UA: NEGATIVE
Ketones, UA: POSITIVE
Leukocytes, UA: NEGATIVE
Nitrite, UA: NEGATIVE
Protein, UA: POSITIVE — AB
Spec Grav, UA: 1.02 (ref 1.010–1.025)
Urobilinogen, UA: 0.2 E.U./dL
pH, UA: 6.5 (ref 5.0–8.0)

## 2019-04-02 MED ORDER — KETOCONAZOLE 2 % EX CREA: 1.0000 "application " | TOPICAL_CREAM | Freq: Two times a day (BID) | CUTANEOUS | 0 refills | Status: DC

## 2019-04-02 NOTE — Telephone Encounter (Signed)
Sx 2week pain in "outer tissues of female parts from clitoris back as if I had a really good love making session" pt state that she has also had back pain. Pt stated that she can bring the urine in a "nice clean jar if you want" Pt stated that it doesn't feel like a regular UTI its just the tissues hurting. Pt was advised to stop by the office to collect urine sample. Pt will be scheduled for today.

## 2019-04-02 NOTE — Telephone Encounter (Signed)
Copied from [place] [id number]. Topic: General - Other >> Apr 02, 2019  2:28 PM Leward Quan A wrote: Reason for CRM: Patient called to say that she is having urgency of urination, pain during urination, and pain in her vaginal area. Asking for something to be sent to the pharmacy for a UTI . also asking for a call back please from Dr Yong Channel Ph# [phone] >> Apr 02, 2019  2:49 PM Carole Binning B wrote: Transferred the call to [facility] to advise.   Pt has been taking care of. Pt was set up for a on office visit

## 2019-04-02 NOTE — Patient Instructions (Addendum)
Your urine did not strongly suggest UTI but your symptoms are suggestive of UTI other than the irritated area in your groin- for that I think you may have a fungal infection-treat with ketoconazole twice a day for up to 2 weeks.  Please let us know if you have new or worsening symptoms.  Please do not put this cream internally.  Stay safe out there!  We will let you know about your urine culture probably next week

## 2019-04-02 NOTE — Progress Notes (Signed)
Phone [phone]   Subjective:  Jessica Curry is a 68 y.o. year old very pleasant female patient who presents for/with See problem oriented charting ROS-see ROS below  Past Medical History-  Patient Active Problem List   Diagnosis Date Noted  . Diabetes mellitus type II, controlled ([place]) 09/10/2016    Priority: High  . CAD (coronary artery disease) 09/10/2016    Priority: High  . Smoker 09/10/2016    Priority: High  . Hyperlipidemia associated with type 2 diabetes mellitus ([place]) 06/04/2017    Priority: Medium  . Hypertension associated with diabetes ([place]) 09/10/2016    Priority: Medium  . Depression, major, recurrent, in partial remission ([place])     Priority: Medium  . BPPV (benign paroxysmal positional vertigo) 08/21/2017    Priority: Low  . Osteopenia 06/10/2017    Priority: Low  . History of colon polyps 08/12/2018  . Simple chronic bronchitis ([place]) 02/11/2018  . Aortic atherosclerosis ([place]) 06/04/2017    Medications- reviewed and updated Current Outpatient Medications  Medication Sig Dispense Refill  . albuterol (PROAIR HFA) 108 (90 Base) MCG/ACT inhaler Inhale 2 puffs into the lungs every 6 (six) hours as needed for wheezing or shortness of breath. 3 Inhaler 1  . ALPRAZolam (XANAX) 0.5 MG tablet Take 1 tablet (0.5 mg total) by mouth 2 (two) times daily as needed for anxiety. 14 tablet 0  . aspirin 81 MG tablet Take 81 mg by mouth daily.      Marland Kitchen atorvastatin (LIPITOR) 20 MG tablet Take 1 tablet (20 mg total) by mouth 3 (three) times a week. 13 tablet 5  . cloNIDine (CATAPRES) 0.1 MG tablet TAKE 1 TABLET BY MOUTH TWO  TIMES DAILY 180 tablet 2  . diphenhydrAMINE (BENADRYL) 25 MG tablet Take 25 mg by mouth every 6 (six) hours as needed.    Marland Kitchen lisinopril-hydrochlorothiazide (PRINZIDE,ZESTORETIC) 20-25 MG tablet TAKE 1 TABLET BY MOUTH  DAILY 90 tablet 3  . meclizine (ANTIVERT) 25 MG tablet TAKE ONE TABLET BY MOUTH THREE TIMES DAILY AS NEEDED FOR dizziness 30 tablet 1  .  metFORMIN (GLUCOPHAGE) 1000 MG tablet TAKE 1 TABLET BY MOUTH TWO  TIMES DAILY WITH A MEAL. 180 tablet 3  . Multiple Vitamins-Minerals (HAIR SKIN AND NAILS FORMULA PO) Take by mouth.    . Multiple Vitamins-Minerals (MULTIVITAMIN WITH MINERALS) tablet Take 1 tablet by mouth daily. Centrum silver for women     . ranitidine (ZANTAC) 150 MG tablet Take 150 mg by mouth 2 (two) times daily.    Marland Kitchen venlafaxine (EFFEXOR) 75 MG tablet TAKE ONE TABLET BY MOUTH THREE TIMES DAILY 270 tablet 3  . ketoconazole (NIZORAL) 2 % cream Apply 1 application topically 2 (two) times daily. 30 g 0  . nitroGLYCERIN (NITROSTAT) 0.4 MG SL tablet Place 1 tablet (0.4 mg total) under the tongue every 5 (five) minutes as needed for chest pain. 25 tablet 3   No current facility-administered medications for this visit.      Objective:  BP 135/80   Pulse 84   Temp 97.6 F (36.4 C) (Oral)   Ht 5\' 8"  (1.727 m)   LMP  (LMP Unknown)   SpO2 94%   BMI 31.14 kg/m  Gen: NAD, resting comfortably CV: RRR  Lungs: nonlabored, normal respiratory rate Abdomen: soft/nondistended Ext: no edema Skin: warm, dry GU: on labia majora and into skin folds of groin- erythema noted- at folds there is a whitish appearance  Results for orders placed or performed in visit on 04/02/19 (from the past  24 hour(s))  POCT Urinalysis Dipstick     Status: Abnormal   Collection Time: 04/02/19  3:35 PM  Result Value Ref Range   Color, UA Yellow    Clarity, UA Cloudy    Glucose, UA Negative Negative   Bilirubin, UA Negative    Ketones, UA Positive    Spec Grav, UA 1.020 1.010 - 1.025   Blood, UA Negative    pH, UA 6.5 5.0 - 8.0   Protein, UA Positive (A) Negative   Urobilinogen, UA 0.2 0.2 or 1.0 E.U./dL   Nitrite, UA Negative    Leukocytes, UA Negative Negative   Appearance     Odor Yes        Assessment and Plan   #Concern for UTI/groin rash S: Patients symptoms started 2 weeks ago.  Complains of dysuria: yes; polyuria: yes; nocturia:  yes; urgency: yes.  Symptoms are getting worse. Also noted irritation over labia majora- not particularly pruritic- but seems to hurt when she wipes in particular and seems sensitive ROS- no fever, chills, nausea, vomiting, flank pain. No blood in urine.  A/P: UA not strongly suggestive of UTI. Will get culture. Empiric treatment with: no antibiotic at present. Concern candidal intertrigo or tinea cruris- will treat with ketoconazole.  Patient to follow up if new or worsening symptoms or failure to improve.   Bronchitis/athero/depression  # Depression S: conrolled venlafaxine 75 mg 3 times a day Depression screen [facility] 2/9 04/02/2019  Decreased Interest 0  Down, Depressed, Hopeless 1  PHQ - 2 Score 1  Altered sleeping 0  Tired, decreased energy 2  Change in appetite 0  Feeling bad or failure about yourself  1  Trouble concentrating 0  Moving slowly or fidgety/restless 0  Suicidal thoughts 0  PHQ-9 Score 4  Difficult doing work/chores Not difficult at all  A/P: Stable. Continue current medications.     #COPD/chronic bronchitis S: Patient needs to stop smoking.  Breathing is stable with sparing albuterol-no other inhalers been used at present A/P:  Stable. Continue current medications.     #Aortic atherosclerosis/hyperlipidemia associated with diabetes S: Patient remains compliant with atorvastatin 20 mg Monday Wednesday Friday-has had myalgias in the past on statins but has been able to tolerate this dose.  Atherosclerosis of aorta noted on CT chest during lung cancer screening Lab Results  Component Value Date   CHOL 162 05/26/2018   HDL 39.00 (L) 05/26/2018   LDLDIRECT 100.0 05/26/2018   TRIG 205.0 (H) 05/26/2018   CHOLHDL 4 05/26/2018   A/P: Likely stable-on maximum tolerable dose of statin-continue current medication   Other notes: 1.  Too soon for A1c repeat-would recommend being seen every 6 months at least   Future Appointments  Date Time Provider Addy   09/08/2019 10:00 AM [facility] HEALTH COACH [facility]   Lab/Order associations: Dysuria - Plan: POCT Urinalysis Dipstick, Urine Culture  Simple chronic bronchitis (HCC), Chronic  Recurrent major depression in full remission ([place]), Chronic  Aortic atherosclerosis ([place]), Chronic  Meds ordered this encounter  Medications  . ketoconazole (NIZORAL) 2 % cream    Sig: Apply 1 application topically 2 (two) times daily.    Dispense:  30 g    Refill:  0   Return precautions advised.  Garret Reddish, MD

## 2019-04-03 ENCOUNTER — Other Ambulatory Visit: Payer: Self-pay

## 2019-04-03 ENCOUNTER — Telehealth: Payer: Self-pay | Admitting: Family Medicine

## 2019-04-03 MED ORDER — VENLAFAXINE HCL ER 75 MG PO CP24: 225.0000 mg | ORAL_CAPSULE | Freq: Every day | ORAL | 11 refills | Status: DC

## 2019-04-03 MED ORDER — ATORVASTATIN CALCIUM 20 MG PO TABS: 20.0000 mg | ORAL_TABLET | Freq: Every day | ORAL | 3 refills | Status: DC

## 2019-04-03 NOTE — Telephone Encounter (Signed)
Copied from [place] [id number]. Topic: General - Other >> Apr 03, 2019 10:23 AM Celene Kras A wrote: Reason for CRM: Lavella Lemons, from [place], called regarding pts venlafaxine (EFFEXOR) 75 MG tablet. She states the tablets require a co pay of $47, but the capsules would be $8. She is needing verbal okay to change it to a capsule. Please advise.

## 2019-04-03 NOTE — Telephone Encounter (Signed)
Yes thanks- may change to capsule if this produces lower price for patient.

## 2019-04-03 NOTE — Telephone Encounter (Signed)
Pharmacy call Change Rx from tablets to capsule Venlafaxine 75mg   Gave verbal to change per Dr. Yong Channel.

## 2019-04-03 NOTE — Telephone Encounter (Signed)
Called and spoke with pharmacy, they will need new rx for the Effexor and will need clarification on directions because the tablet is not extended to pt is taking TID but the capsule IS extended release so they need to confirm is directions will change or if she will still take TID. \  Also needing clarification on Atorvastatin, pt only got 13 pills because directions say to take three days a week. Pt says that she needs directions to say take daily. Lipids haven't been checked since 05/2018.   Forwarding to Dr. Yong Channel to advise.

## 2019-04-03 NOTE — Telephone Encounter (Signed)
I sent in the extended release Effexor-I am surprised to hear this is cheaper.  Sent in for her to take 3 pills all at once in the morning.  I only sent in 30-day supply at a time-if it is cheap enough for her for 90 days I can certainly change this  With atorvastatin-I did not think she could tolerate the higher dose of statin for daily use-she has had muscle aches in the past on statins.  I am happy for her to try the daily dose if she thinks he can tolerate it-I sent in a 90-day supply of this

## 2019-04-04 LAB — URINE CULTURE
MICRO NUMBER:: 455004
SPECIMEN QUALITY:: ADEQUATE

## 2019-04-06 MED ORDER — CEPHALEXIN 500 MG PO CAPS: 500.0000 mg | ORAL_CAPSULE | Freq: Three times a day (TID) | ORAL | 0 refills | Status: DC

## 2019-04-06 NOTE — Addendum Note (Signed)
Addended by: Gwenyth Ober R on: 04/06/2019 09:41 AM   Modules accepted: Orders

## 2019-04-06 NOTE — Telephone Encounter (Signed)
Left message to return phone call.

## 2019-04-07 NOTE — Telephone Encounter (Signed)
Patient notified of results and verbalized understanding. Pt stated she has picked up the Rx. No further action needed!

## 2019-04-14 ENCOUNTER — Other Ambulatory Visit: Payer: Self-pay | Admitting: Family Medicine

## 2019-04-14 ENCOUNTER — Other Ambulatory Visit: Payer: Self-pay

## 2019-04-14 NOTE — Patient Outreach (Signed)
[place] [facility]) Care Management  04/14/2019  Laylanie Kruczek 1951-01-19 [id number]   Medication Adherence call to [facility] Verify spoke with patient she is due on Metformin 1000 mg, Lisinopril/Hctz 20/25 mg and Atorvastatin 20 mg patient explain she is taking 1 tablet daily on two of the medications on Metformin she is taking 1 tablet 2 times a day she ask if we can call [facility] and order all her medication and Clonodine.[facility] will mail all her medications with in 5-7 days. Mrs. Koopmann is showing past due under [place].   [place] Management Direct Dial [phone]  Fax [phone] [email or web address]

## 2019-05-19 ENCOUNTER — Other Ambulatory Visit: Payer: Self-pay | Admitting: *Deleted

## 2019-05-19 DIAGNOSIS — Z122 Encounter for screening for malignant neoplasm of respiratory organs: Secondary | ICD-10-CM

## 2019-05-19 DIAGNOSIS — Z87891 Personal history of nicotine dependence: Secondary | ICD-10-CM

## 2019-05-19 DIAGNOSIS — F17210 Nicotine dependence, cigarettes, uncomplicated: Secondary | ICD-10-CM

## 2019-06-09 ENCOUNTER — Ambulatory Visit (INDEPENDENT_AMBULATORY_CARE_PROVIDER_SITE_OTHER): Payer: Medicare Other | Admitting: Psychology

## 2019-06-09 DIAGNOSIS — F411 Generalized anxiety disorder: Secondary | ICD-10-CM

## 2019-06-09 DIAGNOSIS — F331 Major depressive disorder, recurrent, moderate: Secondary | ICD-10-CM

## 2019-06-09 DIAGNOSIS — F1010 Alcohol abuse, uncomplicated: Secondary | ICD-10-CM | POA: Diagnosis not present

## 2019-06-11 DIAGNOSIS — L570 Actinic keratosis: Secondary | ICD-10-CM | POA: Diagnosis not present

## 2019-06-11 DIAGNOSIS — D2261 Melanocytic nevi of right upper limb, including shoulder: Secondary | ICD-10-CM | POA: Diagnosis not present

## 2019-06-11 DIAGNOSIS — D2262 Melanocytic nevi of left upper limb, including shoulder: Secondary | ICD-10-CM | POA: Diagnosis not present

## 2019-06-11 DIAGNOSIS — L820 Inflamed seborrheic keratosis: Secondary | ICD-10-CM | POA: Diagnosis not present

## 2019-06-11 DIAGNOSIS — L821 Other seborrheic keratosis: Secondary | ICD-10-CM | POA: Diagnosis not present

## 2019-06-11 DIAGNOSIS — L218 Other seborrheic dermatitis: Secondary | ICD-10-CM | POA: Diagnosis not present

## 2019-06-15 ENCOUNTER — Other Ambulatory Visit: Payer: Self-pay | Admitting: Family Medicine

## 2019-06-15 DIAGNOSIS — H02831 Dermatochalasis of right upper eyelid: Secondary | ICD-10-CM | POA: Diagnosis not present

## 2019-06-15 DIAGNOSIS — H02834 Dermatochalasis of left upper eyelid: Secondary | ICD-10-CM | POA: Diagnosis not present

## 2019-06-15 DIAGNOSIS — D1801 Hemangioma of skin and subcutaneous tissue: Secondary | ICD-10-CM | POA: Diagnosis not present

## 2019-06-15 DIAGNOSIS — H0289 Other specified disorders of eyelid: Secondary | ICD-10-CM | POA: Diagnosis not present

## 2019-06-26 ENCOUNTER — Other Ambulatory Visit: Payer: Self-pay

## 2019-06-26 ENCOUNTER — Ambulatory Visit (INDEPENDENT_AMBULATORY_CARE_PROVIDER_SITE_OTHER)
Admission: RE | Admit: 2019-06-26 | Discharge: 2019-06-26 | Disposition: A | Discharge status: Home / Self Care | Payer: Medicare Other | Source: Ambulatory Visit | Attending: Acute Care | Admitting: Acute Care

## 2019-06-26 DIAGNOSIS — F17210 Nicotine dependence, cigarettes, uncomplicated: Secondary | ICD-10-CM

## 2019-06-26 DIAGNOSIS — Z122 Encounter for screening for malignant neoplasm of respiratory organs: Secondary | ICD-10-CM

## 2019-06-26 DIAGNOSIS — Z87891 Personal history of nicotine dependence: Secondary | ICD-10-CM

## 2019-07-03 ENCOUNTER — Telehealth: Payer: Self-pay | Admitting: Acute Care

## 2019-07-03 DIAGNOSIS — Z87891 Personal history of nicotine dependence: Secondary | ICD-10-CM

## 2019-07-03 DIAGNOSIS — Z122 Encounter for screening for malignant neoplasm of respiratory organs: Secondary | ICD-10-CM

## 2019-07-03 DIAGNOSIS — F17210 Nicotine dependence, cigarettes, uncomplicated: Secondary | ICD-10-CM

## 2019-07-03 NOTE — Telephone Encounter (Signed)
Pt had CT Lung Cancer Screen performed. Routing to [employer] so she can contact pt with results.

## 2019-07-03 NOTE — Telephone Encounter (Signed)
Spoke with and scheduled televisit with Jessica Curry on 07/06/19 at 9:00 to discuss results.  Order placed for 3 mth f/u low dose Ct.  Results faxed to PCP.

## 2019-07-06 ENCOUNTER — Encounter: Payer: Self-pay | Admitting: Acute Care

## 2019-07-06 ENCOUNTER — Ambulatory Visit (INDEPENDENT_AMBULATORY_CARE_PROVIDER_SITE_OTHER): Payer: Medicare Other | Admitting: Acute Care

## 2019-07-06 ENCOUNTER — Other Ambulatory Visit: Payer: Self-pay

## 2019-07-06 DIAGNOSIS — R9389 Abnormal findings on diagnostic imaging of other specified body structures: Secondary | ICD-10-CM

## 2019-07-06 MED ORDER — DOXYCYCLINE HYCLATE 100 MG PO TABS: 100.0000 mg | ORAL_TABLET | Freq: Two times a day (BID) | ORAL | 0 refills | Status: DC

## 2019-07-06 MED ORDER — PREDNISONE 10 MG PO TABS: ORAL_TABLET | ORAL | 0 refills | Status: DC

## 2019-07-06 NOTE — Progress Notes (Signed)
Virtual Visit via Telephone Note  I connected with Jessica Curry on 07/06/19 at  9:00 AM EDT by telephone and verified that I am speaking with the correct person using two identifiers.  I  confirmed date of birth and address to authenticate patient  Identity. My nurse Quentin Ore reviewed medications and ordered any refills required.  Location: Patient: At Home Provider: [address] [place], [place], [address]   I discussed the limitations, risks, security and privacy concerns of performing an evaluation and management service by telephone and the availability of in person appointments. I also discussed with the patient that there may be a patient responsible charge related to this service. The patient expressed understanding and agreed to proceed with the plan of care.   History of Present Illness: Pt. Presents for follow up. She had a LDCT 06/26/2019.Her LDCT was read as a Lung RADS 4 A : suspicious findings, either short term follow up in 3 months or alternatively  PET Scan evaluation may be considered when there is a solid component of  8 mm or larger.However the scan looked like the changes were due to infection or inflammation. She states she has had bronchitis type symptoms for the last 3 months, which she has not treated. She is coughing and wheezing and at times. She has not been using her proair inhaler. I explained that we need to treat her with an antibiotic and a prednisone taper , and then we will repeat the scan in 3 months to reassess the area of concern. We will do a follow up in person office visit in 2 weeks to assure resolution. She denies any fever, body aches or chills,  chest pain, orthopnea or hemoptysis. She is practicing social distancing and wearing a mask whenever she leaves her home.   Observations/Objective: Low Dose CT for Lung Cancer Screening 06/26/2019 Lung-RADS 4AS, suspicious. Specifically, new nodular area of architectural distortion in the medial  segment of the right middle lobe, strongly favored to be infectious or inflammatory in etiology. Follow up low-dose chest CT without contrast in 3 months (please use the following order, "CT CHEST LCS NODULE FOLLOW-UP W/O CM") is recommended. The "S" modifier above refers to potentially clinically significant non lung cancer related findings. Specifically, there is aortic atherosclerosis, in addition to left main and 3 vessel coronary artery disease. Please note that although the presence of coronary artery calcium documents the presence of coronary artery disease, the severity of this disease and any potential stenosis cannot be assessed on this non-gated CT examination. Assessment for potential risk factor modification, dietary therapy or pharmacologic therapy may be warranted, if clinically indicated. There are calcifications of the aortic valve. Echocardiographic correlation for evaluation of potential valvular dysfunction may be warranted if clinically indicated. Mild diffuse bronchial wall thickening with very mild centrilobular and paraseptal emphysema; imaging findings suggestive of underlying COPD. Hepatic steatosis.  Assessment and Plan: Abnormal Low Dose CT>> 4A ? If inflammatory vs infectious States she has had bronchitis like symptoms x 3 months which she has not treated Plan Doxycycline 100 mg BID x 7 days Prednisone taper; 10 mg tablets: 4 tabs x 2 days, 3 tabs x 2 days, 2 tabs x 2 days 1 tab x 2 days then stop. Follow up LDCT scan in 3 months Follow up OV in 2 weeks.  Please use ProAir inhaler as needed for shortness of breath or wheezing  Follow Up Instructions: Follow up OV in 2 weeks Follow up LDCT in 3  months. Call if you need [place] sooner   I discussed the assessment and treatment plan with the patient. The patient was provided an opportunity to ask questions and all were answered. The patient agreed with the plan and demonstrated an understanding of the  instructions.   The patient was advised to call back or seek an in-person evaluation if the symptoms worsen or if the condition fails to improve as anticipated.  I provided 25 minutes of non-face-to-face time during this encounter.   Magdalen Spatz, NP 07/06/2019 9:36 AM

## 2019-07-06 NOTE — Patient Instructions (Signed)
It was good to talk with you today We will prescribe the following: Doxycycline 100 mg BID x 7 days Prednisone taper; 10 mg tablets: 4 tabs x 2 days, 3 tabs x 2 days, 2 tabs x 2 days 1 tab x 2 days then stop. Follow up LDCT scan in 3 months Follow up OV in 2 weeks.  Please use ProAir inhaler as needed for shortness of breath or wheezing Please contact office for sooner follow up if symptoms do not improve or worsen or seek emergency care  I will have our billing specialist look at this most recent bill  Follow Up Instructions: Follow up OV in 2 weeks Follow up LDCT in 3 months. Call if you need [place] sooner

## 2019-07-16 ENCOUNTER — Other Ambulatory Visit: Payer: Self-pay

## 2019-07-16 NOTE — Patient Outreach (Signed)
[facility]) Care Management  07/16/2019  Jessica Curry [birth year] [id number]   Medication Adherence call to Mrs. Jessica Curry [facility] Identifiers Verify spoke with patient she is past due on Atorvastatin 20 mg patient explain she is only taking it 3 times a week and increase one tablet every week.patient has medication at this time. Patient ask if we can call [facility] on Lisinopril/Hctz,Metformin and Clononide  [facility] will mail out with in 7 days. Jessica Curry is showing past due under [place].  [facility] Management Direct Dial [phone]  Fax [phone] [email or web address]

## 2019-07-17 ENCOUNTER — Telehealth: Payer: Self-pay

## 2019-07-17 ENCOUNTER — Ambulatory Visit: Payer: Medicare Other | Admitting: Physician Assistant

## 2019-07-17 ENCOUNTER — Other Ambulatory Visit: Payer: Self-pay

## 2019-07-17 DIAGNOSIS — Z20822 Contact with and (suspected) exposure to covid-19: Secondary | ICD-10-CM

## 2019-07-17 NOTE — Telephone Encounter (Signed)
error 

## 2019-07-18 LAB — NOVEL CORONAVIRUS, NAA: SARS-CoV-2, NAA: NOT DETECTED

## 2019-07-20 ENCOUNTER — Ambulatory Visit (INDEPENDENT_AMBULATORY_CARE_PROVIDER_SITE_OTHER): Payer: Medicare Other | Admitting: Family Medicine

## 2019-07-20 ENCOUNTER — Ambulatory Visit: Payer: Medicare Other | Admitting: Acute Care

## 2019-07-20 ENCOUNTER — Other Ambulatory Visit: Payer: Self-pay

## 2019-07-20 ENCOUNTER — Encounter: Payer: Self-pay | Admitting: Family Medicine

## 2019-07-20 ENCOUNTER — Ambulatory Visit: Payer: Self-pay

## 2019-07-20 DIAGNOSIS — R32 Unspecified urinary incontinence: Secondary | ICD-10-CM

## 2019-07-20 DIAGNOSIS — J069 Acute upper respiratory infection, unspecified: Secondary | ICD-10-CM | POA: Diagnosis not present

## 2019-07-20 DIAGNOSIS — B9789 Other viral agents as the cause of diseases classified elsewhere: Secondary | ICD-10-CM

## 2019-07-20 NOTE — Telephone Encounter (Signed)
Noted  

## 2019-07-20 NOTE — Telephone Encounter (Signed)
  Pt. Reports she had a COVID 19 test that "just came back negative", but still feels bad. Fever 103, dry cough, body aches, chills, fatigue.Taking Tylenol for the fever that "helps a little." Warm transfer to [facility] in the practice. Answer Assessment - Initial Assessment Questions 1. TEMPERATURE: "What is the most recent temperature?"  "How was it measured?"      103 2. ONSET: "When did the fever start?"      Last week 3. SYMPTOMS: "Do you have any other symptoms besides the fever?"  (e.g., colds, headache, sore throat, earache, cough, rash, diarrhea, vomiting, abdominal pain)     Cough -dry, chills, body aches, fatigue 4. CAUSE: If there are no symptoms, ask: "What do you think is causing the fever?"      Unsure 5. CONTACTS: "Does anyone else in the family have an infection?"     No 6. TREATMENT: "What have you done so far to treat this fever?" (e.g., medications)     Tylenol 7. IMMUNOCOMPROMISE: "Do you have of the following: diabetes, HIV positive, splenectomy, cancer chemotherapy, chronic steroid treatment, transplant patient, etc."     No 8. PREGNANCY: "Is there any chance you are pregnant?" "When was your last menstrual period?"     No 9. TRAVEL: "Have you traveled out of the country in the last month?" (e.g., travel history, exposures)     No  Protocols used: FEVER-A-AH

## 2019-07-20 NOTE — Progress Notes (Signed)
Virtual Visit via Telephone Note  I connected with Jessica Curry on 07/20/19 at  3:00 PM EDT by telephone and verified that I am speaking with the correct person using two identifiers.   I discussed the limitations, risks, security and privacy concerns of performing an evaluation and management service by telephone and the availability of in person appointments. I also discussed with the patient that there may be a patient responsible charge related to this service. The patient expressed understanding and agreed to proceed.  Location patient: home Location provider: work or home office Participants present for the call: patient, provider Patient did not have a visit in the prior 7 days to address this/these issue(s).   History of Present Illness: Pt is a 68 yo female with pmh sig for HTN, DM 2, CAD, asthma, osteopenia, h/o tobacco use, HLD, and depression followed by Dr. Yong Channel.  Seen today for acute concerns.  Pt with fever Tmax 103 F, sweating, dry cough, decreased appetite, emesis x 2 and HA when coughing since Wednesday afternoon 8/19.  Denies diarrhea, loss of smell or taste.  Taking tylenol.  Had negative COVID test on 8/21 at [facility].  Pt states she does not think she make sense at times when speaking on the phone. States wetting her self because she can't get up and go to the bathroom in time 2/2 fatigue.  Pt denies dysuria, back pain, nausea.  Pt states she finished "Z pak" on Monday.  Per chart pt given doxycycline x 7 days and prednisone taper by Pulmonology.  Pt states her brother is a retired Administrator, [employer] and another brother, Clair Gulling, who is in charge of EMS.  Pt states she typically stays with her 45 yo mother, but she is staying with relatives at this time.   Observations/Objective: Patient sounds cheerful and well on the phone. I do not appreciate any SOB. Speech and thought processing are grossly intact. Patient reported vitals:  Assessment and Plan: Viral URI with  cough -discussed possible causes including influenza, COVID-19.  Though COVID test 07/17/19 was negative, consider possibility of false negative test. -discussed obtaining rapid flu test at [facility].  Pt declines. -supportive care advised.  Tylenol and ice packs or cold cloth on neck or forehead for fever. -given precautions -advised to f/u in the next few days with pcp  Urinary incontinence -possibly 2/2 increased fatigue and inability to get to restroom in time, however consider UA for possible UTI.  Pt declines UC visit at this time. -given precautions   Follow Up Instructions: F/u prn   [id number] [id number] [id number] 11-20 [id number] 21-30 I did not refer this patient for an OV in the next 24 hours for this/these issue(s).  I discussed the assessment and treatment plan with the patient. The patient was provided an opportunity to ask questions and all were answered. The patient agreed with the plan and demonstrated an understanding of the instructions.   The patient was advised to call back or seek an in-person evaluation if the symptoms worsen or if the condition fails to improve as anticipated.  I provided 13 minutes of non-face-to-face time during this encounter.   Billie Ruddy, MD

## 2019-07-27 ENCOUNTER — Ambulatory Visit: Payer: Medicare Other | Admitting: Acute Care

## 2019-07-30 ENCOUNTER — Ambulatory Visit: Payer: Medicare Other | Admitting: Acute Care

## 2019-08-05 ENCOUNTER — Encounter: Payer: Self-pay | Admitting: Acute Care

## 2019-08-05 ENCOUNTER — Ambulatory Visit (INDEPENDENT_AMBULATORY_CARE_PROVIDER_SITE_OTHER): Payer: Medicare Other | Admitting: Acute Care

## 2019-08-05 ENCOUNTER — Other Ambulatory Visit: Payer: Self-pay

## 2019-08-05 DIAGNOSIS — Z23 Encounter for immunization: Secondary | ICD-10-CM | POA: Diagnosis not present

## 2019-08-05 DIAGNOSIS — J45909 Unspecified asthma, uncomplicated: Secondary | ICD-10-CM | POA: Diagnosis not present

## 2019-08-05 DIAGNOSIS — R9389 Abnormal findings on diagnostic imaging of other specified body structures: Secondary | ICD-10-CM | POA: Diagnosis not present

## 2019-08-05 DIAGNOSIS — F17200 Nicotine dependence, unspecified, uncomplicated: Secondary | ICD-10-CM | POA: Diagnosis not present

## 2019-08-05 DIAGNOSIS — J410 Simple chronic bronchitis: Secondary | ICD-10-CM | POA: Diagnosis not present

## 2019-08-05 NOTE — Patient Instructions (Addendum)
It is good to see you today. Continue using your rescue inhaler as needed for shortness of breath or wheezing. We will schedule a follow up CT Chest in 3 months>> should be ordered by Doroteo Glassman  through the screening program.) Please contact office for sooner follow up if symptoms do not improve or worsen or seek emergency care  (this is due 10/31 ish)  High dose flu vaccine today.

## 2019-08-05 NOTE — Progress Notes (Signed)
History of Present Illness Jessica Curry is a 68 y.o. female current every day smoker followed through the [facility] program with COPD. She is here for follow up of an abnormal LDCT.  Pt is followed through the Lung Cancer Screening Program. Low Dose Scan done 06/26/2019 was read as a :    I saw her as a virtual visit 07/06/2019. She stated at that time that she had had bronchitis for 3 months that she had not treated. I  treated her with Doxycycline 100 mg BID x 7 days Prednisone taper; 10 mg tablets: 4 tabs x 2 days, 3 tabs x 2 days, 2 tabs x 2 days 1 tab x 2 days then stop. We planed to do a  follow up LDCT scan in 3 months and do a Follow up OV in 2 weeks to ensure she feels better after treatment.   08/06/2019 Follow up after treatment with Doxy and pred taper:  Pt.  states she feels better. She completed therapy. She states she did have a fever after treatment for about 5 days, and felt like she had the flu She states she went to get COVID tested and that she was negative x 2, 07/17/2019 and last test [facility]. She states he has been better since then. No additional fever or body aches.She endorses clear to slightly yellow secretions in the morning. She states she is using her rescue inhaler once a week or less. She does not use any maintenance inhalers. She denies any fever, chest pain, orthopnea or hemoptysis.She understands we will do a follow up CT in the first week of November to follow up on the Lung RADS 4 A finding 06/26/2019.   I have spent 3 minutes counseling patient on smoking cessation this visit. Patient verbalizes understanding of their  choice to continue smoking and the negative health consequences including worsening of COPD, risk of lung cancer , stroke and heart disease.    Test Results: LDCT 06/26/2019 Lung-RADS 4AS, suspicious. Specifically, new nodular area of architectural distortion in the medial segment of the right middle  lobe, strongly favored to be infectious or inflammatory in etiology. Follow up low-dose chest CT without contrast in 3 months (please use the following order, "CT CHEST LCS NODULE FOLLOW-UP W/O CM") is Recommended. The "S" modifier above refers to potentially clinically significant non lung cancer related findings. Specifically, there is aortic atherosclerosis, in addition to left main and 3 vessel coronary artery disease. Please note that although the presence of coronary artery calcium documents the presence of coronary artery disease, the severity of this disease and any potential stenosis cannot be assessed on this non-gated CT examination. Assessment for potential risk factor modification, dietary therapy or pharmacologic therapy may be warranted, if clinically indicated. There are calcifications of the aortic valve. Echocardiographic correlation for evaluation of potential valvular dysfunction may be warranted if clinically indicated. Mild diffuse bronchial wall thickening with very mild centrilobular and paraseptal emphysema; imaging findings suggestive of underlying COPD. Hepatic steatosis.  CBC Latest Ref Rng & Units 05/26/2018 12/02/2017 09/10/2016  WBC 4.0 - 10.5 K/uL 6.2 8.4 7.3  Hemoglobin 12.0 - 15.0 g/dL 11.9(L) 13.5 12.8  Hematocrit 36.0 - 46.0 % 35.1(L) 40.4 37.6  Platelets 150.0 - 400.0 K/uL 389.0 408.0(H) 356.0    BMP Latest Ref Rng & Units 05/26/2018 12/02/2017 09/10/2016  Glucose 70 - 99 mg/dL 106(H) 99 95  BUN 6 - 23 mg/dL 17 16 24(H)  Creatinine 0.40 -  1.20 mg/dL 0.99 0.99 0.96  Sodium 135 - 145 mEq/L 135 134(L) 137  Potassium 3.5 - 5.1 mEq/L 4.5 4.4 4.6  Chloride 96 - 112 mEq/L 98 99 101  CO2 19 - 32 mEq/L 29 28 28   Calcium 8.4 - 10.5 mg/dL 10.2 10.6(H) 10.6(H)    BNP No results found for: BNP  ProBNP No results found for: PROBNP  PFT No results found for: FEV1PRE, FEV1POST, FVCPRE, FVCPOST, TLC, DLCOUNC, PREFEV1FVCRT, PSTFEV1FVCRT  No results found.    Past medical hx Past Medical History:  Diagnosis Date  . Allergy   . Asthma   . Chicken pox   . Depression    reasonable control on venlafaxine 75mg  TID. xanax #14 lasts about 2 years for anxiety portion.   . Diabetes mellitus   . Genital warts    no outbreaks since the 58s  . Hyperlipidemia   . Hypertension      Social History   Tobacco Use  . Smoking status: Current Every Day Smoker    Years: 47.00    Types: Cigarettes  . Smokeless tobacco: Never Used  Substance Use Topics  . Alcohol use: Yes    Alcohol/week: 4.0 standard drinks    Types: 4 Cans of beer per week  . Drug use: No     Tobacco Cessation: Current every day smoker 45 pack year smoking history  Past surgical hx, Family hx, Social hx all reviewed.  Current Outpatient Medications on File Prior to Visit  Medication Sig  . albuterol (PROAIR HFA) 108 (90 Base) MCG/ACT inhaler Inhale 2 puffs into the lungs every 6 (six) hours as needed for wheezing or shortness of breath.  . ALPRAZolam (XANAX) 0.5 MG tablet Take 1 tablet (0.5 mg total) by mouth 2 (two) times daily as needed for anxiety.  Marland Kitchen aspirin 81 MG tablet Take 81 mg by mouth daily.    Marland Kitchen atorvastatin (LIPITOR) 20 MG tablet TAKE 1 TABLET BY MOUTH  DAILY  . cloNIDine (CATAPRES) 0.1 MG tablet TAKE 1 TABLET BY MOUTH TWO  TIMES DAILY  . diphenhydrAMINE (BENADRYL) 25 MG tablet Take 25 mg by mouth every 6 (six) hours as needed.  Marland Kitchen ketoconazole (NIZORAL) 2 % cream Apply 1 application topically 2 (two) times daily.  Marland Kitchen lisinopril-hydrochlorothiazide (PRINZIDE,ZESTORETIC) 20-25 MG tablet TAKE 1 TABLET BY MOUTH  DAILY  . metFORMIN (GLUCOPHAGE) 1000 MG tablet TAKE 1 TABLET BY MOUTH TWO  TIMES DAILY WITH A MEAL.  . Multiple Vitamins-Minerals (HAIR SKIN AND NAILS FORMULA PO) Take by mouth.  . Multiple Vitamins-Minerals (MULTIVITAMIN WITH MINERALS) tablet Take 1 tablet by mouth daily. Centrum silver for women   . venlafaxine XR (EFFEXOR-XR) 75 MG 24 hr capsule Take 3  capsules (225 mg total) by mouth daily with breakfast.  . nitroGLYCERIN (NITROSTAT) 0.4 MG SL tablet Place 1 tablet (0.4 mg total) under the tongue every 5 (five) minutes as needed for chest pain.   No current facility-administered medications on file prior to visit.      No Known Allergies  Review Of Systems:  Constitutional:   No  weight loss, night sweats,  Fevers, chills, fatigue, or  lassitude.  HEENT:   No headaches,  Difficulty swallowing,  Tooth/dental problems, or  Sore throat,                No sneezing, itching, ear ache, nasal congestion, post nasal drip,   CV:  No chest pain,  Orthopnea, PND, swelling in lower extremities, anasarca, dizziness, palpitations, syncope.  GI  No heartburn, indigestion, abdominal pain, nausea, vomiting, diarrhea, change in bowel habits, loss of appetite, bloody stools.   Resp: No shortness of breath with exertion or at rest.  No excess mucus, no productive cough,  No non-productive cough,  No coughing up of blood.  No change in color of mucus.  No wheezing.  No chest wall deformity  Skin: no rash or lesions.  GU: no dysuria, change in color of urine, no urgency or frequency.  No flank pain, no hematuria   MS:  No joint pain or swelling.  No decreased range of motion.  No back pain.  Psych:  No change in mood or affect. No depression or anxiety.  No memory loss.   Vital Signs BP 128/66 (BP Location: Left Arm, Cuff Size: Normal)   Pulse 80   Temp 97.7 F (36.5 C) (Oral)   Ht 5\' 7"  (1.702 m)   Wt 199 lb (90.3 kg)   LMP  (LMP Unknown)   SpO2 98%   BMI 31.17 kg/m    Physical Exam:  General- No distress,  A&Ox3, pleasant ENT: No sinus tenderness, TM clear, pale nasal mucosa, no oral exudate,no post nasal drip, no LAN Cardiac: S1, S2, regular rate and rhythm, no murmur Chest: No wheeze/ rales/ dullness; no accessory muscle use, no nasal flaring, no sternal retractions Abd.: Soft Non-tender, ND, BS +, Body mass index is 31.17 kg/m.  Ext: No clubbing cyanosis, edema Neuro:  normal strength, MAE x 4, A&O x 3. appropriate Skin: No rashes,no lesions,  warm and dry Psych: normal mood and behavior   Assessment/Plan  Asthma No acute flares Does not use/ need  maintenance inhalers Plan Continue using  rescue inhaler as needed for shortness of breath or wheezing.   Simple chronic bronchitis (HCC) Treated for flare with prednisone and Doxycycline 07/06/2019-07/13/2019 Plan Stressed to patient Proactive attention to flares to allow prompt treatment for early resolution  Smoker 45 pack year smoking history Current every day smoker She is followed by annual lung cancer screening program Last scan Lung RADS 4 A : suspicious findings, either short term follow up in 3 months or alternatively  PET Scan evaluation may be considered when there is a solid component of  8 mm or larger. Plan I have spent 3 minutes counseling patient on smoking cessation this visit. Patient verbalizes understanding of their  choice to continue smoking and the negative health consequences including worsening of COPD, risk of lung cancer , stroke and heart disease. Smoking cessation options offered   Abnormal CT of the chest Followed through Lung cancer screening program Scan 06/26/2019 Lung-RADS 4AS, suspicious. Specifically, new nodular area of architectural distortion in the medial segment of the right middle lobe, strongly favored to be infectious or inflammatory in etiology. Follow up low-dose chest CT without contrast was recommended in  3 months. Treated with Doxycycline and prednisone  07/06/2019-07/13/2019 Plan Completed treatment of Doxy and pred Feels better Follow up LDCT first week of November 2020 to assess for resolution has been ordered.    Magdalen Spatz, NP 08/06/2019  11:25 AM

## 2019-08-06 ENCOUNTER — Encounter: Payer: Self-pay | Admitting: Acute Care

## 2019-08-06 DIAGNOSIS — J45909 Unspecified asthma, uncomplicated: Secondary | ICD-10-CM | POA: Insufficient documentation

## 2019-08-06 DIAGNOSIS — R9389 Abnormal findings on diagnostic imaging of other specified body structures: Secondary | ICD-10-CM | POA: Insufficient documentation

## 2019-08-06 DIAGNOSIS — J4520 Mild intermittent asthma, uncomplicated: Secondary | ICD-10-CM | POA: Insufficient documentation

## 2019-08-06 NOTE — Assessment & Plan Note (Addendum)
Followed through Lung cancer screening program Scan 06/26/2019 Lung-RADS 4AS, suspicious. Specifically, new nodular area of architectural distortion in the medial segment of the right middle lobe, strongly favored to be infectious or inflammatory in etiology. Follow up low-dose chest CT without contrast was recommended in  3 months. Treated with Doxycycline and prednisone  07/06/2019-07/13/2019 Plan Completed treatment of Doxy and pred Feels better Follow up LDCT first week of November 2020 to assess for resolution has been ordered.

## 2019-08-06 NOTE — Assessment & Plan Note (Signed)
45 pack year smoking history Current every day smoker She is followed by annual lung cancer screening program Last scan Lung RADS 4 A : suspicious findings, either short term follow up in 3 months or alternatively  PET Scan evaluation may be considered when there is a solid component of  8 mm or larger. Plan I have spent 3 minutes counseling patient on smoking cessation this visit. Patient verbalizes understanding of their  choice to continue smoking and the negative health consequences including worsening of COPD, risk of lung cancer , stroke and heart disease. Smoking cessation options offered

## 2019-08-06 NOTE — Assessment & Plan Note (Signed)
Treated for flare with prednisone and Doxycycline 07/06/2019-07/13/2019 Plan Stressed to patient Proactive attention to flares to allow prompt treatment for early resolution

## 2019-08-06 NOTE — Assessment & Plan Note (Signed)
No acute flares Does not use/ need  maintenance inhalers Plan Continue using  rescue inhaler as needed for shortness of breath or wheezing.

## 2019-09-03 ENCOUNTER — Other Ambulatory Visit: Payer: Self-pay | Admitting: Family Medicine

## 2019-09-08 ENCOUNTER — Ambulatory Visit (INDEPENDENT_AMBULATORY_CARE_PROVIDER_SITE_OTHER): Payer: Medicare Other

## 2019-09-08 ENCOUNTER — Other Ambulatory Visit: Payer: Self-pay

## 2019-09-08 VITALS — BP 136/74 | Temp 97.2°F | Ht 67.0 in | Wt 202.6 lb

## 2019-09-08 DIAGNOSIS — Z0000 Encounter for general adult medical examination without abnormal findings: Secondary | ICD-10-CM

## 2019-09-08 NOTE — Progress Notes (Signed)
I have personally reviewed the Medicare Annual Wellness Visit and agree with the assessment and plan.  Will cosign for PCP that it is out of the office today and cc.  Algis Greenhouse. Jerline Pain, MD 09/08/2019 1:05 PM
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
## 2019-09-08 NOTE — Patient Instructions (Signed)
Jessica Curry , Thank you for taking time to come for your Medicare Wellness Visit. I appreciate your ongoing commitment to your health goals. Please review the following plan we discussed and let me know if I can assist you in the future.   Screening recommendations/referrals: Colorectal Screening: colonoscopy completed 08/12/18 with Dr. Silverio Decamp  Mammogram: up to date; last 07/15/18 Bone Density: up to date; last 06/10/17  Vision and Dental Exams: Recommended annual ophthalmology exams for early detection of glaucoma and other disorders of the eye Recommended annual dental exams for proper oral hygiene  Diabetic Exams: Diabetic Eye Exam: recommended yearly Diabetic Foot Exam: at next visit   Vaccinations: Influenza vaccine: completed 08/05/19 Pneumococcal vaccine: up to date; last 12/02/17 Tdap vaccine: up to date; last 12/05/18 Shingles vaccine:Shingrix completed   Advanced directives: Advance directives discussed with you today. I have provided a copy for you to complete at home and have notarized. Once this is complete please bring a copy in to our office so we can scan it into your chart.  Goals: Recommend to continue efforts to reduce smoking habits until no longer smoking. Smoking Cessation literature is attached below.  Next appointment: Please schedule your Annual Wellness Visit with your Nurse Health Advisor in one year.  Preventive Care 45 Years and Older, Female Preventive care refers to lifestyle choices and visits with your health care provider that can promote health and wellness. What does preventive care include?  A yearly physical exam. This is also called an annual well check.  Dental exams once or twice a year.  Routine eye exams. Ask your health care provider how often you should have your eyes checked.  Personal lifestyle choices, including:  Daily care of your teeth and gums.  Regular physical activity.  Eating a healthy diet.  Avoiding tobacco and drug  use.  Limiting alcohol use.  Practicing safe sex.  Taking low-dose aspirin every day if recommended by your health care provider.  Taking vitamin and mineral supplements as recommended by your health care provider. What happens during an annual well check? The services and screenings done by your health care provider during your annual well check will depend on your age, overall health, lifestyle risk factors, and family history of disease. Counseling  Your health care provider may ask you questions about your:  Alcohol use.  Tobacco use.  Drug use.  Emotional well-being.  Home and relationship well-being.  Sexual activity.  Eating habits.  History of falls.  Memory and ability to understand (cognition).  Work and work Statistician.  Reproductive health. Screening  You may have the following tests or measurements:  Height, weight, and BMI.  Blood pressure.  Lipid and cholesterol levels. These may be checked every 5 years, or more frequently if you are over 45 years old.  Skin check.  Lung cancer screening. You may have this screening every year starting at age 50 if you have a 30-pack-year history of smoking and currently smoke or have quit within the past 15 years.  Fecal occult blood test (FOBT) of the stool. You may have this test every year starting at age 41.  Flexible sigmoidoscopy or colonoscopy. You may have a sigmoidoscopy every 5 years or a colonoscopy every 10 years starting at age 65.  Hepatitis C blood test.  Hepatitis B blood test.  Sexually transmitted disease (STD) testing.  Diabetes screening. This is done by checking your blood sugar (glucose) after you have not eaten for a while (fasting). You may have  this done every 1-3 years.  Bone density scan. This is done to screen for osteoporosis. You may have this done starting at age 78.  Mammogram. This may be done every 1-2 years. Talk to your health care provider about how often you should  have regular mammograms. Talk with your health care provider about your test results, treatment options, and if necessary, the need for more tests. Vaccines  Your health care provider may recommend certain vaccines, such as:  Influenza vaccine. This is recommended every year.  Tetanus, diphtheria, and acellular pertussis (Tdap, Td) vaccine. You may need a Td booster every 10 years.  Zoster vaccine. You may need this after age 55.  Pneumococcal 13-valent conjugate (PCV13) vaccine. One dose is recommended after age 46.  Pneumococcal polysaccharide (PPSV23) vaccine. One dose is recommended after age 40. Talk to your health care provider about which screenings and vaccines you need and how often you need them. This information is not intended to replace advice given to you by your health care provider. Make sure you discuss any questions you have with your health care provider. Document Released: 12/09/2015 Document Revised: 08/01/2016 Document Reviewed: 09/13/2015 Elsevier Interactive Patient Education  2017 [place] Prevention in the Home Falls can cause injuries. They can happen to people of all ages. There are many things you can do to make your home safe and to help prevent falls. What can I do on the outside of my home?  Regularly fix the edges of walkways and driveways and fix any cracks.  Remove anything that might make you trip as you walk through a door, such as a raised step or threshold.  Trim any bushes or trees on the path to your home.  Use bright outdoor lighting.  Clear any walking paths of anything that might make someone trip, such as rocks or tools.  Regularly check to see if handrails are loose or broken. Make sure that both sides of any steps have handrails.  Any raised decks and porches should have guardrails on the edges.  Have any leaves, snow, or ice cleared regularly.  Use sand or salt on walking paths during winter.  Clean up any spills in  your garage right away. This includes oil or grease spills. What can I do in the bathroom?  Use night lights.  Install grab bars by the toilet and in the tub and shower. Do not use towel bars as grab bars.  Use non-skid mats or decals in the tub or shower.  If you need to sit down in the shower, use a plastic, non-slip stool.  Keep the floor dry. Clean up any water that spills on the floor as soon as it happens.  Remove soap buildup in the tub or shower regularly.  Attach bath mats securely with double-sided non-slip rug tape.  Do not have throw rugs and other things on the floor that can make you trip. What can I do in the bedroom?  Use night lights.  Make sure that you have a light by your bed that is easy to reach.  Do not use any sheets or blankets that are too big for your bed. They should not hang down onto the floor.  Have a firm chair that has side arms. You can use this for support while you get dressed.  Do not have throw rugs and other things on the floor that can make you trip. What can I do in the kitchen?  Clean up any  spills right away.  Avoid walking on wet floors.  Keep items that you use a lot in easy-to-reach places.  If you need to reach something above you, use a strong step stool that has a grab bar.  Keep electrical cords out of the way.  Do not use floor polish or wax that makes floors slippery. If you must use wax, use non-skid floor wax.  Do not have throw rugs and other things on the floor that can make you trip. What can I do with my stairs?  Do not leave any items on the stairs.  Make sure that there are handrails on both sides of the stairs and use them. Fix handrails that are broken or loose. Make sure that handrails are as long as the stairways.  Check any carpeting to make sure that it is firmly attached to the stairs. Fix any carpet that is loose or worn.  Avoid having throw rugs at the top or bottom of the stairs. If you do have  throw rugs, attach them to the floor with carpet tape.  Make sure that you have a light switch at the top of the stairs and the bottom of the stairs. If you do not have them, ask someone to add them for you. What else can I do to help prevent falls?  Wear shoes that:  Do not have high heels.  Have rubber bottoms.  Are comfortable and fit you well.  Are closed at the toe. Do not wear sandals.  If you use a stepladder:  Make sure that it is fully opened. Do not climb a closed stepladder.  Make sure that both sides of the stepladder are locked into place.  Ask someone to hold it for you, if possible.  Clearly mark and make sure that you can see:  Any grab bars or handrails.  First and last steps.  Where the edge of each step is.  Use tools that help you move around (mobility aids) if they are needed. These include:  Canes.  Walkers.  Scooters.  Crutches.  Turn on the lights when you go into a dark area. Replace any light bulbs as soon as they burn out.  Set up your furniture so you have a clear path. Avoid moving your furniture around.  If any of your floors are uneven, fix them.  If there are any pets around you, be aware of where they are.  Review your medicines with your doctor. Some medicines can make you feel dizzy. This can increase your chance of falling. Ask your doctor what other things that you can do to help prevent falls. This information is not intended to replace advice given to you by your health care provider. Make sure you discuss any questions you have with your health care provider. Document Released: 09/08/2009 Document Revised: 04/19/2016 Document Reviewed: 12/17/2014 Elsevier Interactive Patient Education  2017 [employer].

## 2019-09-09 NOTE — Patient Instructions (Addendum)
Health Maintenance Due  Topic Date Due  . HEMOGLOBIN A1C -today 07/07/2019  . FOOT EXAM-today  08/07/2019   We will call you within two weeks about your referral to psychiatry. If you do not hear within 3 weeks, give [place] a call. Can take some time to get into psychiatry and you may have to call from your own line (they often require self referral)  If you have any thoughts of self harm let us know immediately  Please stop by lab before you go If you do not have mychart- we will call you about results within 5 business days of [place] receiving them.  If you have mychart- we will send your results within 3 business days of [place] receiving them.  If abnormal or we want to clarify a result, we will call or mychart you to make sure you receive the message.  If you have questions or concerns or don't hear within 5-7 days, please send [place] a message or call us.
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
## 2019-09-17 ENCOUNTER — Ambulatory Visit (INDEPENDENT_AMBULATORY_CARE_PROVIDER_SITE_OTHER): Payer: Medicare Other | Admitting: Family Medicine

## 2019-09-17 ENCOUNTER — Other Ambulatory Visit: Payer: Self-pay

## 2019-09-17 ENCOUNTER — Encounter: Payer: Self-pay | Admitting: Family Medicine

## 2019-09-17 VITALS — BP 138/82 | HR 84 | Temp 97.9°F | Ht 67.0 in | Wt 202.8 lb

## 2019-09-17 DIAGNOSIS — F3341 Major depressive disorder, recurrent, in partial remission: Secondary | ICD-10-CM

## 2019-09-17 DIAGNOSIS — E119 Type 2 diabetes mellitus without complications: Secondary | ICD-10-CM | POA: Diagnosis not present

## 2019-09-17 DIAGNOSIS — I10 Essential (primary) hypertension: Secondary | ICD-10-CM

## 2019-09-17 DIAGNOSIS — I152 Hypertension secondary to endocrine disorders: Secondary | ICD-10-CM

## 2019-09-17 DIAGNOSIS — F324 Major depressive disorder, single episode, in partial remission: Secondary | ICD-10-CM | POA: Diagnosis not present

## 2019-09-17 DIAGNOSIS — E1169 Type 2 diabetes mellitus with other specified complication: Secondary | ICD-10-CM | POA: Diagnosis not present

## 2019-09-17 DIAGNOSIS — E785 Hyperlipidemia, unspecified: Secondary | ICD-10-CM

## 2019-09-17 DIAGNOSIS — Z79899 Other long term (current) drug therapy: Secondary | ICD-10-CM | POA: Diagnosis not present

## 2019-09-17 DIAGNOSIS — E1159 Type 2 diabetes mellitus with other circulatory complications: Secondary | ICD-10-CM

## 2019-09-17 LAB — COMPREHENSIVE METABOLIC PANEL
ALT: 16 U/L (ref 0–35)
AST: 17 U/L (ref 0–37)
Albumin: 4.4 g/dL (ref 3.5–5.2)
Alkaline Phosphatase: 117 U/L (ref 39–117)
BUN: 22 mg/dL (ref 6–23)
CO2: 26 mEq/L (ref 19–32)
Calcium: 10.6 mg/dL — ABNORMAL HIGH (ref 8.4–10.5)
Chloride: 101 mEq/L (ref 96–112)
Creatinine, Ser: 1.05 mg/dL (ref 0.40–1.20)
GFR: 52.09 mL/min — ABNORMAL LOW (ref >60.00)
Glucose, Bld: 147 mg/dL — ABNORMAL HIGH (ref 70–99)
Potassium: 4.2 mEq/L (ref 3.5–5.1)
Sodium: 136 mEq/L (ref 135–145)
Total Bilirubin: 0.3 mg/dL (ref 0.2–1.2)
Total Protein: 6.8 g/dL (ref 6.0–8.3)

## 2019-09-17 LAB — CBC WITH DIFFERENTIAL/PLATELET
Basophils Absolute: 0.1 10*3/uL (ref 0.0–0.1)
Basophils Relative: 1.1 % (ref 0.0–3.0)
Eosinophils Absolute: 0.2 10*3/uL (ref 0.0–0.7)
Eosinophils Relative: 3.6 % (ref 0.0–5.0)
HCT: 39.3 % (ref 36.0–46.0)
Hemoglobin: 13.4 g/dL (ref 12.0–15.0)
Lymphocytes Relative: 29.1 % (ref 12.0–46.0)
Lymphs Abs: 2 10*3/uL (ref 0.7–4.0)
MCHC: 34 g/dL (ref 30.0–36.0)
MCV: 86.1 fl (ref 78.0–100.0)
Monocytes Absolute: 0.5 10*3/uL (ref 0.1–1.0)
Monocytes Relative: 6.8 % (ref 3.0–12.0)
Neutro Abs: 4 10*3/uL (ref 1.4–7.7)
Neutrophils Relative %: 59.4 % (ref 43.0–77.0)
Platelets: 334 10*3/uL (ref 150.0–400.0)
RBC: 4.57 Mil/uL (ref 3.87–5.11)
RDW: 17.4 % — ABNORMAL HIGH (ref 11.5–15.5)
WBC: 6.8 10*3/uL (ref 4.0–10.5)

## 2019-09-17 LAB — POC URINALSYSI DIPSTICK (AUTOMATED)
Bilirubin, UA: NEGATIVE
Blood, UA: NEGATIVE
Glucose, UA: NEGATIVE
Ketones, UA: NEGATIVE
Leukocytes, UA: NEGATIVE
Nitrite, UA: NEGATIVE
Protein, UA: POSITIVE — AB
Spec Grav, UA: 1.02 (ref 1.010–1.025)
Urobilinogen, UA: 0.2 E.U./dL
pH, UA: 6 (ref 5.0–8.0)

## 2019-09-17 LAB — LIPID PANEL
Cholesterol: 164 mg/dL (ref 0–200)
HDL: 41.4 mg/dL (ref >39.00)
NonHDL: 123.05
Total CHOL/HDL Ratio: 4
Triglycerides: 226 mg/dL — ABNORMAL HIGH (ref 0.0–149.0)
VLDL: 45.2 mg/dL — ABNORMAL HIGH (ref 0.0–40.0)

## 2019-09-17 LAB — POCT GLYCOSYLATED HEMOGLOBIN (HGB A1C): Hemoglobin A1C: 6.3 % — AB (ref 4.0–5.6)

## 2019-09-17 LAB — VITAMIN B12: Vitamin B-12: 405 pg/mL (ref 211–911)

## 2019-09-17 LAB — LDL CHOLESTEROL, DIRECT: Direct LDL: 96 mg/dL

## 2019-09-17 MED ORDER — ATORVASTATIN CALCIUM 20 MG PO TABS: 20.0000 mg | ORAL_TABLET | ORAL | 3 refills | Status: DC

## 2019-09-21 ENCOUNTER — Ambulatory Visit (INDEPENDENT_AMBULATORY_CARE_PROVIDER_SITE_OTHER): Payer: Medicare Other | Admitting: Family Medicine

## 2019-09-21 ENCOUNTER — Encounter: Payer: Self-pay | Admitting: Family Medicine

## 2019-09-21 ENCOUNTER — Other Ambulatory Visit: Payer: Self-pay

## 2019-09-21 ENCOUNTER — Telehealth: Payer: Self-pay

## 2019-09-21 VITALS — BP 140/80 | HR 62 | Temp 97.1°F | Resp 16 | Ht 67.0 in | Wt 203.8 lb

## 2019-09-21 DIAGNOSIS — M5410 Radiculopathy, site unspecified: Secondary | ICD-10-CM | POA: Diagnosis not present

## 2019-09-21 MED ORDER — DICLOFENAC SODIUM 75 MG PO TBEC: 75.0000 mg | DELAYED_RELEASE_TABLET | Freq: Two times a day (BID) | ORAL | 0 refills | Status: DC

## 2019-09-21 MED ORDER — GABAPENTIN 100 MG PO CAPS: 100.0000 mg | ORAL_CAPSULE | Freq: Three times a day (TID) | ORAL | 3 refills | Status: DC

## 2019-09-21 MED ORDER — TRAMADOL HCL 50 MG PO TABS: 50.0000 mg | ORAL_TABLET | Freq: Three times a day (TID) | ORAL | 0 refills | Status: AC | PRN

## 2019-09-21 NOTE — Telephone Encounter (Signed)
Notes recorded by Marin Olp, MD on 09/17/2019 at 5:29 PM EDT  Your CBC was largely normal (blood counts, infection fighting cells, platelets).  Your CMET was largely normal (kidney, liver, and electrolytes, blood sugar). Your calcium was a hair high-make sure to remain well-hydrated. I am going to check a parathyroid hormone test with your next lab which can cause calcium to increase as well as an ionized calcium. Kidney function remains slightly decreased but stable over the last year-it is currently in chronic kidney disease stage III range-in this range we primarily need to control blood pressure and cholesterol and avoid medicines like ibuprofen/Aleve/Motrin. You can take Tylenol as needed for pain though.   Your cholesterol was above goal with bad cholesterol at 96 and goal under 70-team please increase her atorvastatin to 40 mg daily #90 with 3 refills.   Urine is largely normal-some protein which could be related to diabetes-glad you are on the lisinopril  Your B12 was in healthy range   Called patient gave all results.   She is currently on the atovastain 20mg  three times a week due to muscle aches. She wanted to know if you want her to take 40mg  three times a week or try to take daily.  She was seen today by Dr. Jonni Sanger due to thigh pain and started on Gabapentin, voltarin and tramadol.

## 2019-09-21 NOTE — Telephone Encounter (Signed)
Since she is having the muscle aches- have her see if she can do 4x a week without pain increasing for 2 weeks- if she can go to 5x a week after 2 weeks, etc to 7 days a week- if muscles ache worsen- can step back to 3x a week

## 2019-09-21 NOTE — Patient Instructions (Signed)
Please return in 1 week for recheck with Dr. Yong Channel or myself.   Let's start with an anti-inflammatory medication called diclofenac twice a day and gabapentin 3x/day. You may use the tramadol in addition to control pain if needed.   Let me know if 2-3 days if your symptoms are not improving any.   If you have any questions or concerns, please don't hesitate to send me a message via MyChart or call the office at [phone]. Thank you for visiting with [place] today! It's our pleasure caring for you.

## 2019-09-21 NOTE — Progress Notes (Signed)
Subjective  CC:  Chief Complaint  Patient presents with  . Thigh pain    Right side, started 10/24.Marland Kitchen Denies injury/fall.. Reports that she has had lower back pain for months and pain radiates down right thigh.. Has tried Tylenol with no relief   Same day acute visit; PCP not available. New pt to me. Chart reviewed.   HPI: Jessica Curry is a 68 y.o. female who presents to the office today to address the problems listed above in the chief complaint.  Very pleasant 68 yo with long standing diabetes and other chronic problems reviewed and listed below presents with 2 days of right ant thigh pain described as severe, sharp burning and worse if stands. It has caused her to fall twice. Denies weakness bowel or bladder dysfunction. Pain radiates to above knee. New problem. Admits to pain in the 'small of her back' for several months but no chronic low back pain problems. No xrays in chart. Used tylenol but that did not help. No pain if lying or sitting. no rash identified. No f/c/s. No trauma or injury.   Well controlled diabetic.    Assessment  1. Radicular leg pain      Plan   Anterior thigh pain:  Differential includes lumbar radiculopathy and diabetic amyothrophic neuropathic pain. Zoster also is in differential. Will treat with nsaid, gabapentin and ultram if needed. Education given. Close f/u. Discussed red flag sxs in detail. None identified today. Recommend using a cane or walker for now to help avoid further falls. Monitor for rash . Consider lumbar xrays if persists.   Follow up: 1 week for recheck, sooner if needed.   Visit date not found  No orders of the defined types were placed in this encounter.  Meds ordered this encounter  Medications  . diclofenac (VOLTAREN) 75 MG EC tablet    Sig: Take 1 tablet (75 mg total) by mouth 2 (two) times daily.    Dispense:  30 tablet    Refill:  0  . gabapentin (NEURONTIN) 100 MG capsule    Sig: Take 1 capsule (100 mg total) by mouth 3  (three) times daily.    Dispense:  90 capsule    Refill:  3  . traMADol (ULTRAM) 50 MG tablet    Sig: Take 1 tablet (50 mg total) by mouth every 8 (eight) hours as needed for up to 5 days.    Dispense:  15 tablet    Refill:  0      I reviewed the patients updated PMH, FH, and SocHx.    Patient Active Problem List   Diagnosis Date Noted  . Asthma 08/06/2019  . Abnormal CT of the chest 08/06/2019  . History of colon polyps 08/12/2018  . Simple chronic bronchitis ([place]) 02/11/2018  . BPPV (benign paroxysmal positional vertigo) 08/21/2017  . Osteopenia 06/10/2017  . Hyperlipidemia associated with type 2 diabetes mellitus ([place]) 06/04/2017  . Aortic atherosclerosis ([place]) 06/04/2017  . Diabetes mellitus type II, controlled ([place]) 09/10/2016  . CAD (coronary artery disease) 09/10/2016  . Hypertension associated with diabetes ([place]) 09/10/2016  . Smoker 09/10/2016  . Depression, major, recurrent, in partial remission (HCC)    Current Meds  Medication Sig  . albuterol (PROAIR HFA) 108 (90 Base) MCG/ACT inhaler Inhale 2 puffs into the lungs every 6 (six) hours as needed for wheezing or shortness of breath.  Marland Kitchen aspirin 81 MG tablet Take 81 mg by mouth daily.    Marland Kitchen atorvastatin (LIPITOR) 20 MG tablet Take  1 tablet (20 mg total) by mouth every other day.  . cloNIDine (CATAPRES) 0.1 MG tablet TAKE 1 TABLET BY MOUTH TWO  TIMES DAILY  . diphenhydrAMINE (BENADRYL) 25 MG tablet Take 25 mg by mouth every 6 (six) hours as needed.  Marland Kitchen ketoconazole (NIZORAL) 2 % cream Apply 1 application topically 2 (two) times daily.  Marland Kitchen lisinopril-hydrochlorothiazide (ZESTORETIC) 20-25 MG tablet TAKE 1 TABLET BY MOUTH  DAILY  . metFORMIN (GLUCOPHAGE) 1000 MG tablet TAKE 1 TABLET BY MOUTH TWO  TIMES DAILY WITH MEALS  . Multiple Vitamins-Minerals (HAIR SKIN AND NAILS FORMULA PO) Take by mouth.  . Multiple Vitamins-Minerals (MULTIVITAMIN WITH MINERALS) tablet Take 1 tablet by mouth daily. Centrum silver for women   .  venlafaxine XR (EFFEXOR-XR) 75 MG 24 hr capsule Take 3 capsules (225 mg total) by mouth daily with breakfast.    Allergies: Patient has No Known Allergies. Family History: Patient family history includes AAA (abdominal aortic aneurysm) in her mother; Arthritis in her brother and mother; Dementia in her maternal grandmother; Heart attack in her brother; Hyperlipidemia in her brother; Hypertension in her brother; Hypothyroidism in her mother; Kidney disease in her mother; Other in her father; Ovarian cancer in her mother; Stroke in her brother. Social History:  Patient  reports that she has been smoking cigarettes. She has a 14.40 pack-year smoking history. She has never used smokeless tobacco. She reports current alcohol use of about 4.0 standard drinks of alcohol per week. She reports that she does not use drugs.  Review of Systems: Constitutional: Negative for fever malaise or anorexia Cardiovascular: negative for chest pain Respiratory: negative for SOB or persistent cough Gastrointestinal: negative for abdominal pain  Objective  Vitals: BP 140/80   Pulse 62   Temp (!) 97.1 F (36.2 C) (Tympanic)   Resp 16   Ht 5\' 7"  (1.702 m)   Wt 203 lb 12.8 oz (92.4 kg)   LMP  (LMP Unknown)   SpO2 98%   BMI 31.92 kg/m  General: no acute distress sitting but has pain with standing, able to get to table unassisted , A&Ox3 HEENT: PEERL, conjunctiva normal, Oropharynx moist,neck is supple Cardiovascular:  RRR without murmur or gallop.  Respiratory:  Good breath sounds bilaterally, CTAB with normal respiratory effort Skin:  Warm, no rashes Back/msk/neuro: nontender lumbar spine, neg seated SLR bilaterally, +2 LE DTRs bilaterally. 5/5 LE strength bilaterally, nontender quads.     Commons side effects, risks, benefits, and alternatives for medications and treatment plan prescribed today were discussed, and the patient expressed understanding of the given instructions. Patient is instructed to call  or message via MyChart if he/she has any questions or concerns regarding our treatment plan. No barriers to understanding were identified. We discussed Red Flag symptoms and signs in detail. Patient expressed understanding regarding what to do in case of urgent or emergency type symptoms.   Medication list was reconciled, printed and provided to the patient in AVS. Patient instructions and summary information was reviewed with the patient as documented in the AVS. This note was prepared with assistance of Dragon voice recognition software. Occasional wrong-word or sound-a-like substitutions may have occurred due to the inherent limitations of voice recognition software

## 2019-09-22 NOTE — Telephone Encounter (Signed)
Left message with Judson Roch to have pt call office.

## 2019-09-23 NOTE — Telephone Encounter (Signed)
Spoke to pt told her Dr. Yong Channel would like her to try this regimen for her Atorvastatin. He said since she is having the muscle aches- have her see if she can do 4x a week without pain increasing for 2 weeks- if she can go to 5x a week after 2 weeks, etc to 7 days a week- if muscles ache worsen- can step back to 3x a week. Pt verbalized understanding.

## 2019-09-30 ENCOUNTER — Ambulatory Visit (INDEPENDENT_AMBULATORY_CARE_PROVIDER_SITE_OTHER)
Admission: RE | Admit: 2019-09-30 | Discharge: 2019-09-30 | Disposition: A | Discharge status: Home / Self Care | Payer: Medicare Other | Source: Ambulatory Visit | Attending: Acute Care | Admitting: Acute Care

## 2019-09-30 ENCOUNTER — Other Ambulatory Visit: Payer: Self-pay

## 2019-09-30 DIAGNOSIS — R918 Other nonspecific abnormal finding of lung field: Secondary | ICD-10-CM

## 2019-09-30 DIAGNOSIS — Z122 Encounter for screening for malignant neoplasm of respiratory organs: Secondary | ICD-10-CM

## 2019-09-30 DIAGNOSIS — F17210 Nicotine dependence, cigarettes, uncomplicated: Secondary | ICD-10-CM

## 2019-09-30 DIAGNOSIS — J432 Centrilobular emphysema: Secondary | ICD-10-CM | POA: Diagnosis not present

## 2019-09-30 DIAGNOSIS — Z87891 Personal history of nicotine dependence: Secondary | ICD-10-CM

## 2019-10-01 ENCOUNTER — Encounter: Payer: Self-pay | Admitting: Family Medicine

## 2019-10-01 ENCOUNTER — Ambulatory Visit (INDEPENDENT_AMBULATORY_CARE_PROVIDER_SITE_OTHER): Payer: Medicare Other | Admitting: Family Medicine

## 2019-10-01 VITALS — BP 180/85 | HR 71 | Temp 98.3°F | Ht 67.0 in | Wt 206.0 lb

## 2019-10-01 DIAGNOSIS — W101XXA Fall (on)(from) sidewalk curb, initial encounter: Secondary | ICD-10-CM

## 2019-10-01 DIAGNOSIS — M545 Low back pain, unspecified: Secondary | ICD-10-CM

## 2019-10-01 DIAGNOSIS — M5410 Radiculopathy, site unspecified: Secondary | ICD-10-CM

## 2019-10-01 DIAGNOSIS — I10 Essential (primary) hypertension: Secondary | ICD-10-CM

## 2019-10-01 DIAGNOSIS — E1159 Type 2 diabetes mellitus with other circulatory complications: Secondary | ICD-10-CM

## 2019-10-01 MED ORDER — DICLOFENAC SODIUM 75 MG PO TBEC: 75.0000 mg | DELAYED_RELEASE_TABLET | Freq: Two times a day (BID) | ORAL | 0 refills | Status: DC

## 2019-10-01 NOTE — Patient Instructions (Signed)
Glad you are feeling better!  I have refilled the antiinflammatory medication to have IF you need it.   Please make an appointment with "Carloyn Manner" for further evaluation of your back pain and giveway symptoms.

## 2019-10-01 NOTE — Progress Notes (Signed)
Subjective  CC:  Chief Complaint  Patient presents with  . Back Pain    seen last week thigh is better but back pain is only little beter   . Hypertension    BP elevated just took medicaitons 30 minutes ago.   . Fall    x 5 days ago "leg gave out" fell on left side hip and head.     HPI: Jessica Curry is a 68 y.o. female who presents to the office today to address the problems listed above in the chief complaint.  F/u visit for anterior right thigh pain; see last note. ? Amyotrophic neuropathy vs lumbar radiculopathy; pt is much improved. No longer having pain in leg or paresthesias on gabapentin, voltaren and no longer needing ultram. However has mild low back pain. Also reports "legs gave way" over the weekend and pt fell. Reports has bad knee with giveway: see Dr. Percell Miller. Denies bowel or bladder incontinence. Happened when she was stepping up over unlevel concrete. No LOC  HTN: had been controlled. Elevated today.  Assessment  1. Radicular leg pain   2. Right low back pain, unspecified chronicity, unspecified whether sciatica present   3. Fall (on)(from) sidewalk curb, initial encounter   4. 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 Continue gabapentin and may use voltaren IF needed.   Fall: give way sxs may be due to knee; further eval from ortho. Pt to schedule an appt  Htn: elevated today. Pt to monitor at home and f/u with Dr. Yong Channel if persists.   Follow up:   Visit date not found  No orders of the defined types were placed in this encounter.  Meds ordered this encounter  Medications  . diclofenac (VOLTAREN) 75 MG EC tablet    Sig: Take 1 tablet (75 mg total) by mouth 2 (two) times daily.    Dispense:  30 tablet    Refill:  0      I reviewed the patients updated PMH, FH, and SocHx.     Patient Active Problem List   Diagnosis Date Noted  . Asthma 08/06/2019  . Abnormal CT of the chest 08/06/2019  . History of colon polyps 08/12/2018  . Simple chronic bronchitis ([place]) 02/11/2018  . BPPV (benign paroxysmal positional vertigo) 08/21/2017  . Osteopenia 06/10/2017  . Hyperlipidemia associated with type 2 diabetes mellitus ([place]) 06/04/2017  . Aortic atherosclerosis ([place]) 06/04/2017  . Diabetes mellitus type II, controlled ([place]) 09/10/2016  . CAD (coronary artery disease) 09/10/2016  . Hypertension associated with diabetes ([place]) 09/10/2016  . Smoker 09/10/2016  . Depression, major, recurrent, in partial remission (H[facility])    Current Meds  Medication Sig  . albuterol (PROAIR HFA) 108 (90 Base) MCG/ACT inhaler Inhale 2 puffs into the lungs every 6 (six) hours as needed for wheezing or shortness of breath.  Marland Kitchen aspirin 81 MG tablet Take 81 mg by mouth daily.    Marland Kitchen atorvastatin (LIPITOR) 20 MG tablet Take 1 tablet (20 mg total) by mouth every other day.  . cloNIDine (CATAPRES) 0.1 MG tablet TAKE 1 TABLET BY MOUTH TWO  TIMES DAILY  . diclofenac (VOLTAREN) 75 MG EC tablet Take 1 tablet (75 mg total) by mouth 2 (two) times daily.  . diphenhydrAMINE (BENADRYL) 25 MG tablet Take 25 mg by mouth every 6 (six) hours as  needed.  . gabapentin (NEURONTIN) 100 MG capsule Take 1 capsule (100 mg total) by mouth 3 (three) times daily.  Marland Kitchen ketoconazole (NIZORAL) 2 % cream Apply 1 application topically 2 (two) times daily.  Marland Kitchen lisinopril-hydrochlorothiazide (ZESTORETIC) 20-25 MG tablet TAKE 1 TABLET BY MOUTH  DAILY  . metFORMIN (GLUCOPHAGE) 1000 MG tablet TAKE 1 TABLET BY MOUTH TWO  TIMES DAILY WITH MEALS  . Multiple Vitamins-Minerals (HAIR SKIN AND NAILS FORMULA PO) Take by mouth.  . Multiple Vitamins-Minerals (MULTIVITAMIN WITH MINERALS) tablet Take 1 tablet by mouth daily. Centrum silver for women   . venlafaxine XR (EFFEXOR-XR) 75 MG 24 hr capsule Take 3 capsules (225 mg total) by mouth daily with  breakfast.  . [DISCONTINUED] diclofenac (VOLTAREN) 75 MG EC tablet Take 1 tablet (75 mg total) by mouth 2 (two) times daily.    Allergies: Patient has No Known Allergies. Family History: Patient family history includes AAA (abdominal aortic aneurysm) in her mother; Arthritis in her brother and mother; Dementia in her maternal grandmother; Heart attack in her brother; Hyperlipidemia in her brother; Hypertension in her brother; Hypothyroidism in her mother; Kidney disease in her mother; Other in her father; Ovarian cancer in her mother; Stroke in her brother. Social History:  Patient  reports that she has been smoking cigarettes. She has a 14.40 pack-year smoking history. She has never used smokeless tobacco. She reports current alcohol use of about 4.0 standard drinks of alcohol per week. She reports that she does not use drugs.  Review of Systems: Constitutional: Negative for fever malaise or anorexia Cardiovascular: negative for chest pain Respiratory: negative for SOB or persistent cough Gastrointestinal: negative for abdominal pain  Objective  Vitals: BP (!) 180/85   Pulse 71   Temp 98.3 F (36.8 C) (Temporal)   Ht 5\' 7"  (1.702 m)   Wt 206 lb (93.4 kg)   LMP  (LMP Unknown)   SpO2 98%   BMI 32.26 kg/m  General: no acute distress , A&Ox3 Normal gait. Comfortable today MSK: 5/5 LE strength throughout, +2 DTRs, Neg Seated SLR Back: FROM w/o pain, nontender.      Commons side effects, risks, benefits, and alternatives for medications and treatment plan prescribed today were discussed, and the patient expressed understanding of the given instructions. Patient is instructed to call or message via MyChart if he/she has any questions or concerns regarding our treatment plan. No barriers to understanding were identified. We discussed Red Flag symptoms and signs in detail. Patient expressed understanding regarding what to do in case of urgent or emergency type symptoms.   Medication  list was reconciled, printed and provided to the patient in AVS. Patient instructions and summary information was reviewed with the patient as documented in the AVS. This note was prepared with assistance of Dragon voice recognition software. Occasional wrong-word or sound-a-like substitutions may have occurred due to the inherent limitations of voice recognition software

## 2019-10-08 ENCOUNTER — Telehealth: Payer: Self-pay | Admitting: Acute Care

## 2019-10-08 DIAGNOSIS — Z87891 Personal history of nicotine dependence: Secondary | ICD-10-CM

## 2019-10-08 DIAGNOSIS — Z122 Encounter for screening for malignant neoplasm of respiratory organs: Secondary | ICD-10-CM

## 2019-10-08 DIAGNOSIS — F17210 Nicotine dependence, cigarettes, uncomplicated: Secondary | ICD-10-CM

## 2019-10-08 NOTE — Telephone Encounter (Signed)
LMTC x 1  

## 2019-10-12 ENCOUNTER — Other Ambulatory Visit: Payer: Self-pay

## 2019-10-12 DIAGNOSIS — Z20822 Contact with and (suspected) exposure to covid-19: Secondary | ICD-10-CM

## 2019-10-12 NOTE — Telephone Encounter (Signed)
Pt informed of CT results per Sarah Groce, NP.  PT verbalized understanding.  Copy sent to PCP.  Order placed for 1 yr f/u CT.  

## 2019-10-14 LAB — NOVEL CORONAVIRUS, NAA: SARS-CoV-2, NAA: NOT DETECTED

## 2019-10-21 DIAGNOSIS — M25562 Pain in left knee: Secondary | ICD-10-CM | POA: Diagnosis not present

## 2019-11-09 DIAGNOSIS — H5201 Hypermetropia, right eye: Secondary | ICD-10-CM | POA: Diagnosis not present

## 2019-11-09 DIAGNOSIS — E119 Type 2 diabetes mellitus without complications: Secondary | ICD-10-CM | POA: Diagnosis not present

## 2019-11-09 DIAGNOSIS — Z7984 Long term (current) use of oral hypoglycemic drugs: Secondary | ICD-10-CM | POA: Diagnosis not present

## 2019-11-09 DIAGNOSIS — H52203 Unspecified astigmatism, bilateral: Secondary | ICD-10-CM | POA: Diagnosis not present

## 2019-11-09 DIAGNOSIS — H2513 Age-related nuclear cataract, bilateral: Secondary | ICD-10-CM | POA: Diagnosis not present

## 2019-11-19 ENCOUNTER — Other Ambulatory Visit: Payer: Self-pay | Admitting: Family Medicine

## 2020-02-03 ENCOUNTER — Telehealth: Payer: Self-pay | Admitting: Family Medicine

## 2020-02-03 NOTE — Telephone Encounter (Signed)
Im ok with transfer if Dr. Jonni Sanger is. Tell her thank you for the opportunity to be her physician these last few years. I will still be available to her in the future as needed of course for acute issues

## 2020-02-03 NOTE — Telephone Encounter (Signed)
Patient is requesting to transfer from Dr. Yong Channel to Dr. Jonni Sanger.  Patient states she seen Dr. Jonni Sanger at her last visit and feels like she connects better with Dr. Jonni Sanger.  Patient also states she is not "feeling well". . . .states she is experiencing some fatigue and pain in her stomach.   I had offered to schedule patient a virtual appointment.  She refused wanting to wait to see if Dr. Jonni Sanger would take her on first.

## 2020-02-04 NOTE — Telephone Encounter (Signed)
Patient scheduled 3/17 at 2:30pm for acute visit and 6/16 for TOC.

## 2020-02-04 NOTE — Telephone Encounter (Signed)
That is fine.   Thank you

## 2020-02-10 ENCOUNTER — Encounter: Payer: Self-pay | Admitting: Family Medicine

## 2020-02-10 ENCOUNTER — Other Ambulatory Visit: Payer: Self-pay

## 2020-02-10 ENCOUNTER — Ambulatory Visit (INDEPENDENT_AMBULATORY_CARE_PROVIDER_SITE_OTHER): Payer: Medicare Other | Admitting: Family Medicine

## 2020-02-10 VITALS — BP 148/84 | HR 73 | Temp 97.6°F | Ht 67.0 in | Wt 204.8 lb

## 2020-02-10 DIAGNOSIS — N3000 Acute cystitis without hematuria: Secondary | ICD-10-CM | POA: Diagnosis not present

## 2020-02-10 DIAGNOSIS — R5383 Other fatigue: Secondary | ICD-10-CM

## 2020-02-10 DIAGNOSIS — E119 Type 2 diabetes mellitus without complications: Secondary | ICD-10-CM

## 2020-02-10 DIAGNOSIS — Z955 Presence of coronary angioplasty implant and graft: Secondary | ICD-10-CM | POA: Insufficient documentation

## 2020-02-10 DIAGNOSIS — R8290 Unspecified abnormal findings in urine: Secondary | ICD-10-CM

## 2020-02-10 DIAGNOSIS — E1159 Type 2 diabetes mellitus with other circulatory complications: Secondary | ICD-10-CM | POA: Diagnosis not present

## 2020-02-10 DIAGNOSIS — Z122 Encounter for screening for malignant neoplasm of respiratory organs: Secondary | ICD-10-CM | POA: Insufficient documentation

## 2020-02-10 DIAGNOSIS — R1030 Lower abdominal pain, unspecified: Secondary | ICD-10-CM | POA: Diagnosis not present

## 2020-02-10 DIAGNOSIS — I10 Essential (primary) hypertension: Secondary | ICD-10-CM | POA: Diagnosis not present

## 2020-02-10 HISTORY — DX: Presence of coronary angioplasty implant and graft: Z95.5

## 2020-02-10 LAB — POCT URINALYSIS DIPSTICK
Bilirubin, UA: NEGATIVE
Blood, UA: NEGATIVE
Glucose, UA: NEGATIVE
Ketones, UA: NEGATIVE
Nitrite, UA: NEGATIVE
Protein, UA: POSITIVE — AB
Spec Grav, UA: 1.015 (ref 1.010–1.025)
Urobilinogen, UA: 0.2 E.U./dL
pH, UA: 6 (ref 5.0–8.0)

## 2020-02-10 MED ORDER — SULFAMETHOXAZOLE-TRIMETHOPRIM 800-160 MG PO TABS: 1.0000 | ORAL_TABLET | Freq: Two times a day (BID) | ORAL | 0 refills | Status: DC

## 2020-02-10 NOTE — Progress Notes (Signed)
Subjective  CC:  Chief Complaint  Patient presents with  . Fatigue    started 3 weeks ago.  . Abdominal Pain    lower abdomen and back pain. urine smells "funny" for 2 weeks. Pt is having urgency and frequency with cloudy urine   Acute visit for problems. Pt has TOC appt with me scheduled for June. I've reviewed her chart. HPI: Jessica Curry is a 69 y.o. female who presents to the office today to address the problems listed above in the chief complaint.  2 weeks of strange smelling cloudy urine with mild frequency w/o dysuria or gross hematuria. Denies abdominal pain but has pain in small of her back. No f/c/s but experiencing increased fatigue over this time frame; especially in late afternoon. Sleeping ok although stays stressed about her mother and brother's relationship. No myalgias, f/c/s, n/v/ uri sxs. Denies cp or palpitations. No DOE. Mood is fair; has anxiety. Reports diabetic control is good; appetite is down.   HTN: nl readings at home. Mildly elevated today which is unusual. May be due to sxs noted above.    Assessment  1. Cloudy urine   2. Hypertension associated with diabetes ([place])   3. Lower abdominal pain   4. Acute cystitis without hematuria   5. Fatigue, unspecified type   6. Controlled type 2 diabetes mellitus without complication, without long-term current use of insulin (HCC)      Plan   Fatigue and urinary sxs:  Likely cystitis; start septra and await culture. Monitor for improvement in sxs. Check labs to ensure no other contributing factors. Return if not improving.   HTN: monitor for now. Has been well controlled.   DM: recheck levels today.   Follow up: No follow-ups on file.  05/11/2020  Orders Placed This Encounter  Procedures  . Urine Culture  . CBC with Differential/Platelet  . Comprehensive metabolic panel  . Hemoglobin A1c  . TSH  . Sedimentation rate  . POCT Urinalysis Dipstick   Meds ordered this encounter  Medications  .  sulfamethoxazole-trimethoprim (BACTRIM DS) 800-160 MG tablet    Sig: Take 1 tablet by mouth 2 (two) times daily.    Dispense:  14 tablet    Refill:  0      I reviewed the patients updated PMH, FH, and SocHx.    Patient Active Problem List   Diagnosis Date Noted  . History of heart artery stent 02/10/2020    Priority: High  . Simple chronic bronchitis ([place]) 02/11/2018    Priority: High  . Hyperlipidemia associated with type 2 diabetes mellitus ([place]) 06/04/2017    Priority: High  . Diabetes mellitus type II, controlled ([place]) 09/10/2016    Priority: High  . CAD (coronary artery disease), native coronary artery 09/10/2016    Priority: High  . Hypertension associated with diabetes ([place]) 09/10/2016    Priority: High  . Smoker 09/10/2016    Priority: High  . Depression, major, recurrent, in partial remission ([place])     Priority: High  . History of colon polyps 08/12/2018    Priority: Medium  . Osteopenia 06/10/2017    Priority: Medium  . Encounter for screening for lung cancer 02/10/2020  . Mild intermittent asthma 08/06/2019   Current Meds  Medication Sig  . albuterol (PROAIR HFA) 108 (90 Base) MCG/ACT inhaler Inhale 2 puffs into the lungs every 6 (six) hours as needed for wheezing or shortness of breath.  Marland Kitchen aspirin 81 MG tablet Take 81 mg by mouth daily.    Marland Kitchen  atorvastatin (LIPITOR) 20 MG tablet Take 1 tablet (20 mg total) by mouth every other day.  . cloNIDine (CATAPRES) 0.1 MG tablet TAKE 1 TABLET BY MOUTH  TWICE DAILY  . diphenhydrAMINE (BENADRYL) 25 MG tablet Take 25 mg by mouth every 6 (six) hours as needed.  Marland Kitchen lisinopril-hydrochlorothiazide (ZESTORETIC) 20-25 MG tablet TAKE 1 TABLET BY MOUTH  DAILY  . metFORMIN (GLUCOPHAGE) 1000 MG tablet TAKE 1 TABLET BY MOUTH TWO  TIMES DAILY WITH MEALS  . Multiple Vitamins-Minerals (MULTIVITAMIN WITH MINERALS) tablet Take 1 tablet by mouth daily. Centrum silver for women   . venlafaxine XR (EFFEXOR-XR) 75 MG 24 hr capsule Take 3 capsules  (225 mg total) by mouth daily with breakfast.    Allergies: Patient has No Known Allergies. Family History: Patient family history includes AAA (abdominal aortic aneurysm) in her mother; Arthritis in her brother and mother; Dementia in her maternal grandmother; Heart attack in her brother; Hyperlipidemia in her brother; Hypertension in her brother; Hypothyroidism in her mother; Kidney disease in her mother; Other in her father; Ovarian cancer in her mother; Stroke in her brother. Social History:  Patient  reports that she has been smoking cigarettes. She has a 14.40 pack-year smoking history. She has never used smokeless tobacco. She reports current alcohol use of about 4.0 standard drinks of alcohol per week. She reports that she does not use drugs.  Review of Systems: Constitutional: Negative for fever malaise or anorexia Cardiovascular: negative for chest pain Respiratory: negative for SOB or persistent cough Gastrointestinal: negative for abdominal pain  Objective  Vitals: BP (!) 148/84 (BP Location: Right Arm, Patient Position: Sitting, Cuff Size: Normal)   Pulse 73   Temp 97.6 F (36.4 C) (Temporal)   Ht 5\' 7"  (1.702 m)   Wt 204 lb 12.8 oz (92.9 kg)   LMP  (LMP Unknown)   SpO2 95%   BMI 32.08 kg/m  General: no acute distress , A&Ox3 HEENT: PEERL, conjunctiva normal, neck is supple Cardiovascular:  RRR without murmur or gallop.  Respiratory:  Good breath sounds bilaterally, CTAB with normal respiratory effort Gastrointestinal: soft, flat abdomen, normal active bowel sounds, no palpable masses, no hepatosplenomegaly, no appreciated hernias, no suprapubic pressure. No CVAT Skin:  Warm, no rashes     Commons side effects, risks, benefits, and alternatives for medications and treatment plan prescribed today were discussed, and the patient expressed understanding of the given instructions. Patient is instructed to call or message via MyChart if he/she has any questions or  concerns regarding our treatment plan. No barriers to understanding were identified. We discussed Red Flag symptoms and signs in detail. Patient expressed understanding regarding what to do in case of urgent or emergency type symptoms.   Medication list was reconciled, printed and provided to the patient in AVS. Patient instructions and summary information was reviewed with the patient as documented in the AVS. This note was prepared with assistance of Dragon voice recognition software. Occasional wrong-word or sound-a-like substitutions may have occurred due to the inherent limitations of voice recognition software  This visit occurred during the SARS-CoV-2 public health emergency.  Safety protocols were in place, including screening questions prior to the visit, additional usage of staff PPE, and extensive cleaning of exam room while observing appropriate contact time as indicated for disinfecting solutions.

## 2020-02-10 NOTE — Patient Instructions (Signed)
Please follow up as scheduled for your next visit with me: 05/11/2020   Take the antibiotics and drink plenty of water. I will let you know how your blood test results return.   If you aren't feeling better in 2 weeks, let me know   If you have any questions or concerns, please don't hesitate to send me a message via MyChart or call the office at [phone]. Thank you for visiting with Jessica Curry today! It's our pleasure caring for you.

## 2020-02-11 LAB — TSH: TSH: 1.61 u[IU]/mL (ref 0.35–4.50)

## 2020-02-11 LAB — CBC WITH DIFFERENTIAL/PLATELET
Basophils Absolute: 0.1 10*3/uL (ref 0.0–0.1)
Basophils Relative: 1.5 % (ref 0.0–3.0)
Eosinophils Absolute: 0.2 10*3/uL (ref 0.0–0.7)
Eosinophils Relative: 3 % (ref 0.0–5.0)
HCT: 38.5 % (ref 36.0–46.0)
Hemoglobin: 13.3 g/dL (ref 12.0–15.0)
Lymphocytes Relative: 26.8 % (ref 12.0–46.0)
Lymphs Abs: 2.2 10*3/uL (ref 0.7–4.0)
MCHC: 34.5 g/dL (ref 30.0–36.0)
MCV: 87.8 fl (ref 78.0–100.0)
Monocytes Absolute: 0.5 10*3/uL (ref 0.1–1.0)
Monocytes Relative: 6.2 % (ref 3.0–12.0)
Neutro Abs: 5.2 10*3/uL (ref 1.4–7.7)
Neutrophils Relative %: 62.5 % (ref 43.0–77.0)
Platelets: 369 10*3/uL (ref 150.0–400.0)
RBC: 4.39 Mil/uL (ref 3.87–5.11)
RDW: 13.7 % (ref 11.5–15.5)
WBC: 8.2 10*3/uL (ref 4.0–10.5)

## 2020-02-11 LAB — COMPREHENSIVE METABOLIC PANEL
ALT: 16 U/L (ref 0–35)
AST: 16 U/L (ref 0–37)
Albumin: 4 g/dL (ref 3.5–5.2)
Alkaline Phosphatase: 119 U/L — ABNORMAL HIGH (ref 39–117)
BUN: 20 mg/dL (ref 6–23)
CO2: 28 mEq/L (ref 19–32)
Calcium: 10.7 mg/dL — ABNORMAL HIGH (ref 8.4–10.5)
Chloride: 97 mEq/L (ref 96–112)
Creatinine, Ser: 0.95 mg/dL (ref 0.40–1.20)
GFR: 58.39 mL/min — ABNORMAL LOW (ref >60.00)
Glucose, Bld: 104 mg/dL — ABNORMAL HIGH (ref 70–99)
Potassium: 4.5 mEq/L (ref 3.5–5.1)
Sodium: 132 mEq/L — ABNORMAL LOW (ref 135–145)
Total Bilirubin: 0.3 mg/dL (ref 0.2–1.2)
Total Protein: 6.7 g/dL (ref 6.0–8.3)

## 2020-02-11 LAB — SEDIMENTATION RATE: Sed Rate: 45 mm/hr — ABNORMAL HIGH (ref 0–30)

## 2020-02-11 LAB — HEMOGLOBIN A1C: Hgb A1c MFr Bld: 6.9 % — ABNORMAL HIGH (ref 4.6–6.5)

## 2020-02-12 LAB — URINE CULTURE
MICRO NUMBER:: 10261816
SPECIMEN QUALITY:: ADEQUATE

## 2020-02-12 NOTE — Progress Notes (Signed)
Please call patient: I have reviewed his/her lab results. Urine test does show infection and she needs to complete her antibiotic. This should help her to start feeling back to her normal self. Lab work is stable however her sugar is running higher. Please work on eating a diabetic diet. I will repeat a few things at her next appt in 3 months.

## 2020-02-17 ENCOUNTER — Telehealth: Payer: Self-pay | Admitting: Family Medicine

## 2020-02-17 NOTE — Telephone Encounter (Signed)
Patient is still having urinary frequency, nausea, and fatigue. Her abdomen pain has subsided. She feels worse than when she came to her appointment and is almost finished with her antibiotics. She was notified to continue with the antibiotics and that I will notify Dr. Jonni Sanger.

## 2020-02-17 NOTE — Telephone Encounter (Signed)
Patient calling to speak to jasmine she had a few questions an concerns about her last appt. Please advise.

## 2020-02-18 NOTE — Telephone Encounter (Signed)
Patient will wait until the weekend to see how she is feeling and will call next week if she is still feeling bad. She was notified that Dr. Jonni Sanger will be out of the office all next week, but she can still call the office if symptoms are not better or if they are worse.

## 2020-02-18 NOTE — Telephone Encounter (Signed)
Please call patient: lab work was all normal and urine showed infection. If she is feeling worse, she will need to be seen again to further explore and recheck a urine test. Call pt to schedule. She should be improving with the antibiotic.

## 2020-03-04 ENCOUNTER — Encounter: Payer: Self-pay | Admitting: Family Medicine

## 2020-03-04 DIAGNOSIS — E119 Type 2 diabetes mellitus without complications: Secondary | ICD-10-CM | POA: Diagnosis not present

## 2020-03-04 DIAGNOSIS — H2513 Age-related nuclear cataract, bilateral: Secondary | ICD-10-CM | POA: Diagnosis not present

## 2020-03-04 DIAGNOSIS — H524 Presbyopia: Secondary | ICD-10-CM | POA: Diagnosis not present

## 2020-03-04 LAB — HM DIABETES EYE EXAM

## 2020-03-07 ENCOUNTER — Other Ambulatory Visit: Payer: Self-pay | Admitting: Family Medicine

## 2020-03-07 DIAGNOSIS — Z1231 Encounter for screening mammogram for malignant neoplasm of breast: Secondary | ICD-10-CM

## 2020-03-24 ENCOUNTER — Ambulatory Visit: Payer: Medicare Other | Admitting: Cardiology

## 2020-03-24 ENCOUNTER — Encounter: Payer: Self-pay | Admitting: Cardiology

## 2020-03-24 ENCOUNTER — Other Ambulatory Visit: Payer: Self-pay

## 2020-03-24 VITALS — BP 130/80 | HR 64 | Ht 67.0 in | Wt 204.0 lb

## 2020-03-24 DIAGNOSIS — Z720 Tobacco use: Secondary | ICD-10-CM | POA: Diagnosis not present

## 2020-03-24 DIAGNOSIS — E7800 Pure hypercholesterolemia, unspecified: Secondary | ICD-10-CM | POA: Diagnosis not present

## 2020-03-24 DIAGNOSIS — I10 Essential (primary) hypertension: Secondary | ICD-10-CM

## 2020-03-24 DIAGNOSIS — I739 Peripheral vascular disease, unspecified: Secondary | ICD-10-CM

## 2020-03-24 DIAGNOSIS — I25118 Atherosclerotic heart disease of native coronary artery with other forms of angina pectoris: Secondary | ICD-10-CM

## 2020-03-24 NOTE — Progress Notes (Signed)
Cardiology Office Note:    Date:  03/24/2020   ID:  Jessica Curry, DOB 06/14/1951, MRN [id number]  PCP:  Jessica Arnt, MD  Cardiologist:  Jessica Furbish, MD  Electrophysiologist:  None   Referring MD: Jessica Olp, MD     History of Present Illness:    Jessica Curry is a 69 y.o. female here for the follow-up of coronary artery disease.  Diabetes, non-smoker, brother was MD went to [facility], unfortunately had a massive stroke and died.  16-Jun-2011 cardiac catheterization-circumflex stent 3.5 x 23 mm MultiLink vision placed in [employer]  Used to work at [employer].  She has not had any anginal symptoms since her stenting.  She was without healthcare insurance for about 5 years after losing her job at [employer].  She has worked at the [employer] part-time since then.  Unfortunately continues to smoke.  Not interested in quitting.  Since that 2012 experience she was able to lose 70 pounds.  She had been living with her 65 year old mother stressful.  Past Medical History:  Diagnosis Date  . Allergy   . Asthma   . Chicken pox   . Depression    reasonable control on venlafaxine 75mg  TID. xanax #14 lasts about 2 years for anxiety portion.   . Diabetes mellitus   . Genital warts    no outbreaks since the 34s  . History of heart artery stent 02/10/2020  . Hyperlipidemia   . Hypertension     Past Surgical History:  Procedure Laterality Date  . CORONARY ANGIOPLASTY WITH STENT PLACEMENT     2012, [place]  . DILATION AND CURETTAGE OF UTERUS    . TONSILLECTOMY  1968    Current Medications: Current Meds  Medication Sig  . albuterol (PROAIR HFA) 108 (90 Base) MCG/ACT inhaler Inhale 2 puffs into the lungs every 6 (six) hours as needed for wheezing or shortness of breath.  Marland Kitchen aspirin 81 MG tablet Take 81 mg by mouth daily.    Marland Kitchen atorvastatin (LIPITOR) 20 MG tablet Take 1 tablet (20 mg total) by mouth every other day.  . cloNIDine (CATAPRES) 0.1 MG tablet TAKE 1 TABLET BY  MOUTH  TWICE DAILY  . diphenhydrAMINE (BENADRYL) 25 MG tablet Take 25 mg by mouth every 6 (six) hours as needed.  Marland Kitchen lisinopril-hydrochlorothiazide (ZESTORETIC) 20-25 MG tablet TAKE 1 TABLET BY MOUTH  DAILY  . metFORMIN (GLUCOPHAGE) 1000 MG tablet TAKE 1 TABLET BY MOUTH TWO  TIMES DAILY WITH MEALS  . Multiple Vitamins-Minerals (MULTIVITAMIN WITH MINERALS) tablet Take 1 tablet by mouth daily. Centrum silver for women   . nitroGLYCERIN (NITROSTAT) 0.4 MG SL tablet Place 1 tablet (0.4 mg total) under the tongue every 5 (five) minutes as needed for chest pain.  Marland Kitchen sulfamethoxazole-trimethoprim (BACTRIM DS) 800-160 MG tablet Take 1 tablet by mouth 2 (two) times daily.  Marland Kitchen venlafaxine XR (EFFEXOR-XR) 75 MG 24 hr capsule Take 3 capsules (225 mg total) by mouth daily with breakfast.     Allergies:   Patient has no known allergies.   Social History   Socioeconomic History  . Marital status: Divorced    Spouse name: Not on file  . Number of children: Not on file  . Years of education: Not on file  . Highest education level: Not on file  Occupational History  . Not on file  Tobacco Use  . Smoking status: Current Every Day Smoker    Packs/day: 0.30    Years: 48.00  Pack years: 14.40    Types: Cigarettes  . Smokeless tobacco: Never Used  . Tobacco comment: handouts provided   Substance and Sexual Activity  . Alcohol use: Yes    Alcohol/week: 4.0 standard drinks    Types: 4 Cans of beer per week  . Drug use: No  . Sexual activity: Yes  Other Topics Concern  . Not on file  Social History Narrative   Divorced. Did not ask about children.       Lives with mother   BS undergrad.    Now struggles to find work and has financial struggles- getting on medicare has helped.    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 Does not "mow or blow"   Social Determinants of Health   Financial Resource Strain:   .  Difficulty of Paying Living Expenses:   Food Insecurity:   . Worried About Charity fundraiser in the Last Year:   . Arboriculturist in the Last Year:   Transportation Needs:   . Film/video editor (Medical):   Marland Kitchen Lack of Transportation (Non-Medical):   Physical Activity:   . Days of Exercise per Week:   . Minutes of Exercise per Session:   Stress:   . Feeling of Stress :   Social Connections:   . Frequency of Communication with Friends and Family:   . Frequency of Social Gatherings with Friends and Family:   . Attends Religious Services:   . Active Member of Clubs or Organizations:   . Attends Archivist Meetings:   Marland Kitchen Marital Status:      Family History: The patient's family history includes AAA (abdominal aortic aneurysm) in her mother; Arthritis in her brother and mother; Dementia in her maternal grandmother; Heart attack in her brother; Hyperlipidemia in her brother; Hypertension in her brother; Hypothyroidism in her mother; Kidney disease in her mother; Other in her father; Ovarian cancer in her mother; Stroke in her brother. There is no history of Colon cancer, Esophageal cancer, Rectal cancer, or Stomach cancer.  ROS:   Please see the history of present illness.     All other systems reviewed and are negative.  EKGs/Labs/Other Studies Reviewed:    The following studies were reviewed today: As above  EKG:  EKG is  ordered today.  The ekg ordered today demonstrates sinus rhythm 64 no other changes  Recent Labs: 02/10/2020: ALT 16; BUN 20; Creatinine, Ser 0.95; Hemoglobin 13.3; Platelets 369.0; Potassium 4.5; Sodium 132; TSH 1.61  Recent Lipid Panel    Component Value Date/Time   CHOL 164 09/17/2019 1433   TRIG 226.0 (H) 09/17/2019 1433   HDL 41.40 09/17/2019 1433   CHOLHDL 4 09/17/2019 1433   VLDL 45.2 (H) 09/17/2019 1433   LDLDIRECT 96.0 09/17/2019 1433    Physical Exam:    VS:  BP 130/80   Pulse 64   Ht 5\' 7"  (1.702 m)   Wt 204 lb (92.5 kg)    LMP  (LMP Unknown)   SpO2 98%   BMI 31.95 kg/m     Wt Readings from Last 3 Encounters:  03/24/20 204 lb (92.5 kg)  02/10/20 204 lb 12.8 oz (92.9 kg)  10/01/19 206 lb (93.4 kg)     GEN:  Well nourished, well developed in no acute distress HEENT: Normal NECK: No JVD; No carotid bruits LYMPHATICS: No lymphadenopathy CARDIAC: RRR, no murmurs,  rubs, gallops RESPIRATORY:  Clear to auscultation without rales, wheezing or rhonchi  ABDOMEN: Soft, non-tender, non-distended MUSCULOSKELETAL:  No edema; No deformity  SKIN: Warm and dry NEUROLOGIC:  Alert and oriented x 3 PSYCHIATRIC:  Normal affect   ASSESSMENT:    1. Coronary artery disease of native artery of native heart with stable angina pectoris ([place])   2. Essential hypertension   3. Tobacco use   4. Pure hypercholesterolemia   5. Claudication in peripheral vascular disease ([place])    PLAN:    In order of problems listed above:  Leg pain -Points mostly to the right leg upper thigh.  I had challenges in palpating her distal pedal pulses.  We will go ahead and order a lower extremity arterial Doppler for further evaluation.  This could be musculoskeletal however.  Coronary artery disease -Stable with no anginal symptoms.  Continue with prevention efforts including aspirin atorvastatin and blood pressure medications.  Hyperlipidemia -On atorvastatin 20 mg a day.  LDL in October 2020 from outside labs was 96.  Ultimately her goal is less than 70.  She has had trouble with these medications in the past and she is taking it 3 times a week.  Prior statin intolerance.  Tobacco use -Continue to encourage cessation.  Hypertension -Currently under good control.  Medications reviewed as above.  No changes made.  Anxiety/occasional panic attacks -She states that it is challenging at times to adjust to her family dynamics.  She had previously had Xanax to help.  Medication Adjustments/Labs and Tests Ordered: Current medicines are  reviewed at length with the patient today.  Concerns regarding medicines are outlined above.  Orders Placed This Encounter  Procedures  . EKG 12-Lead  . VAS [place] LOWER EXTREMITY ARTERIAL DUPLEX   No orders of the defined types were placed in this encounter.   Patient Instructions  Medication Instructions:  Your physician recommends that you continue on your current medications as directed. Please refer to the Current Medication list given to you today.  *If you need a refill on your cardiac medications before your next appointment, please call your pharmacy*   Lab Work: none If you have labs (blood work) drawn today and your tests are completely normal, you will receive your results only by: Marland Kitchen MyChart Message (if you have MyChart) OR . A paper copy in the mail If you have any lab test that is abnormal or we need to change your treatment, we will call you to review the results.   Testing:  Your physician has requested that you have a lower extremity arterial duplex. During this test, exercise and ultrasound are used to evaluate arterial blood flow in the legs. Allow one hour for this exam. There are no restrictions or special instructions.    Follow-Up: At [facility], you and your health needs are our priority.  As part of our continuing mission to provide you with exceptional heart care, we have created designated Provider Care Teams.  These Care Teams include your primary Cardiologist (physician) and Advanced Practice Providers (APPs -  Physician Assistants and Nurse Practitioners) who all work together to provide you with the care you need, when you need it.  We recommend signing up for the patient portal called "MyChart".  Sign up information is provided on this After Visit Summary.  MyChart is used to connect with patients for Virtual Visits (Telemedicine).  Patients are able to view lab/test results, encounter notes, upcoming appointments, etc.  Non-urgent messages can be sent  to your provider as well.   To learn more about what you can do with MyChart, go to [email or web address].    Your next appointment:   12 month(s)  The format for your next appointment:   In Person  Provider:   Candee Furbish, MD   Other Instructions      Signed, Jessica Furbish, MD  03/24/2020 10:22 AM    [place]

## 2020-03-24 NOTE — Patient Instructions (Addendum)
Medication Instructions:  Your physician recommends that you continue on your current medications as directed. Please refer to the Current Medication list given to you today.  *If you need a refill on your cardiac medications before your next appointment, please call your pharmacy*   Lab Work: none If you have labs (blood work) drawn today and your tests are completely normal, you will receive your results only by: Marland Kitchen MyChart Message (if you have MyChart) OR . A paper copy in the mail If you have any lab test that is abnormal or we need to change your treatment, we will call you to review the results.   Testing:  Your physician has requested that you have a lower extremity arterial duplex. During this test, exercise and ultrasound are used to evaluate arterial blood flow in the legs. Allow one hour for this exam. There are no restrictions or special instructions.    Follow-Up: At [facility], you and your health needs are our priority.  As part of our continuing mission to provide you with exceptional heart care, we have created designated Provider Care Teams.  These Care Teams include your primary Cardiologist (physician) and Advanced Practice Providers (APPs -  Physician Assistants and Nurse Practitioners) who all work together to provide you with the care you need, when you need it.  We recommend signing up for the patient portal called "MyChart".  Sign up information is provided on this After Visit Summary.  MyChart is used to connect with patients for Virtual Visits (Telemedicine).  Patients are able to view lab/test results, encounter notes, upcoming appointments, etc.  Non-urgent messages can be sent to your provider as well.   To learn more about what you can do with MyChart, go to [email or web address].    Your next appointment:   12 month(s)  The format for your next appointment:   In Person  Provider:   Candee Furbish, MD   Other Instructions

## 2020-03-31 ENCOUNTER — Other Ambulatory Visit (HOSPITAL_COMMUNITY): Payer: Self-pay | Admitting: Cardiology

## 2020-03-31 DIAGNOSIS — I739 Peripheral vascular disease, unspecified: Secondary | ICD-10-CM

## 2020-04-05 ENCOUNTER — Other Ambulatory Visit: Payer: Self-pay | Admitting: Family Medicine

## 2020-04-06 NOTE — Telephone Encounter (Signed)
LAST APPOINTMENT DATE:02/10/2020  NEXT APPOINTMENT DATE: 05/11/2020   Rx Effexor  LAST REFILL:04/03/2019  QTY:90 11 rf

## 2020-04-08 ENCOUNTER — Ambulatory Visit (HOSPITAL_COMMUNITY)
Admission: RE | Admit: 2020-04-08 | Discharge: 2020-04-08 | Disposition: A | Discharge status: Home / Self Care | Payer: Medicare Other | Source: Ambulatory Visit | Attending: Internal Medicine | Admitting: Internal Medicine

## 2020-04-08 ENCOUNTER — Other Ambulatory Visit: Payer: Self-pay | Admitting: Cardiology

## 2020-04-08 ENCOUNTER — Other Ambulatory Visit: Payer: Self-pay

## 2020-04-08 DIAGNOSIS — I739 Peripheral vascular disease, unspecified: Secondary | ICD-10-CM | POA: Insufficient documentation

## 2020-04-27 ENCOUNTER — Ambulatory Visit: Payer: Medicare Other | Admitting: Family Medicine

## 2020-05-10 ENCOUNTER — Other Ambulatory Visit: Payer: Self-pay

## 2020-05-11 ENCOUNTER — Ambulatory Visit (INDEPENDENT_AMBULATORY_CARE_PROVIDER_SITE_OTHER): Payer: Medicare Other | Admitting: Family Medicine

## 2020-05-11 ENCOUNTER — Encounter: Payer: Self-pay | Admitting: Family Medicine

## 2020-05-11 ENCOUNTER — Ambulatory Visit: Payer: Medicare Other | Admitting: Family Medicine

## 2020-05-11 VITALS — BP 130/80 | HR 83 | Temp 97.4°F | Ht 67.0 in | Wt 201.2 lb

## 2020-05-11 DIAGNOSIS — Z955 Presence of coronary angioplasty implant and graft: Secondary | ICD-10-CM

## 2020-05-11 DIAGNOSIS — M8580 Other specified disorders of bone density and structure, unspecified site: Secondary | ICD-10-CM

## 2020-05-11 DIAGNOSIS — I2510 Atherosclerotic heart disease of native coronary artery without angina pectoris: Secondary | ICD-10-CM | POA: Diagnosis not present

## 2020-05-11 DIAGNOSIS — F3341 Major depressive disorder, recurrent, in partial remission: Secondary | ICD-10-CM

## 2020-05-11 DIAGNOSIS — E119 Type 2 diabetes mellitus without complications: Secondary | ICD-10-CM | POA: Diagnosis not present

## 2020-05-11 DIAGNOSIS — E1169 Type 2 diabetes mellitus with other specified complication: Secondary | ICD-10-CM

## 2020-05-11 DIAGNOSIS — R5383 Other fatigue: Secondary | ICD-10-CM

## 2020-05-11 DIAGNOSIS — E1159 Type 2 diabetes mellitus with other circulatory complications: Secondary | ICD-10-CM

## 2020-05-11 DIAGNOSIS — I10 Essential (primary) hypertension: Secondary | ICD-10-CM

## 2020-05-11 DIAGNOSIS — E785 Hyperlipidemia, unspecified: Secondary | ICD-10-CM

## 2020-05-11 LAB — POCT GLYCOSYLATED HEMOGLOBIN (HGB A1C): Hemoglobin A1C: 6.5 % — AB (ref 4.0–5.6)

## 2020-05-11 NOTE — Patient Instructions (Signed)
Please return in October 2021 for your annual complete physical; please come fasting.  Change to allegra or zyrtec nightly for your allergies.   If you have any questions or concerns, please don't hesitate to send me a message via MyChart or call the office at [phone]. Thank you for visiting with [place] today! It's our pleasure caring for you.  I have ordered a mammogram and/or bone density for you as we discussed today: [x]   Mammogram  [x]   Bone Density  Please call the office checked below to schedule your appointment: Your appointment will at the following location  []   [facility]      [place], [place]         []   [facility]  [place], [place]  Make sure to wear two peace clothing  No lotions powders or deodorants the day of the appointment Make sure to bring picture ID and insurance card.  Bring list of medications you are currently taking including any supplements.
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
## 2020-06-01 ENCOUNTER — Other Ambulatory Visit: Payer: Self-pay | Admitting: Family Medicine

## 2020-06-01 DIAGNOSIS — M8580 Other specified disorders of bone density and structure, unspecified site: Secondary | ICD-10-CM

## 2020-06-17 ENCOUNTER — Telehealth: Payer: Self-pay | Admitting: Family Medicine

## 2020-06-17 NOTE — Progress Notes (Signed)
  Chronic Care Management   Note  06/17/2020 Name: Jessica Curry MRN: [id number] DOB: [birth year]  Jessica Curry is a 69 y.o. year old female who is a primary care patient of Leamon Arnt, MD. I reached out to Jessica Curry by phone today in response to a referral sent by Ms. Kayren Eaves Mattson's PCP, Leamon Arnt, MD.   Ms. Donaghy was given information about Chronic Care Management services today including:  1. CCM service includes personalized support from designated clinical staff supervised by her physician, including individualized plan of care and coordination with other care providers 2. 24/7 contact phone numbers for assistance for urgent and routine care needs. 3. Service will only be billed when office clinical staff spend 20 minutes or more in a month to coordinate care. 4. Only one practitioner may furnish and bill the service in a calendar month. 5. The patient may stop CCM services at any time (effective at the end of the month) by phone call to the office staff.   Patient wishes to consider information provided and/or speak with a member of the care team before deciding about enrollment in care management services.   Follow up plan:  [place]

## 2020-06-28 ENCOUNTER — Telehealth: Payer: Self-pay | Admitting: Family Medicine

## 2020-06-28 NOTE — Telephone Encounter (Signed)
Needs office visit and likely CT. Schedule OV if didn't get seen in urgent care/ed. thanks

## 2020-06-28 NOTE — Telephone Encounter (Signed)
Patient called in this morning to advise [place] that she had fell this past weekend, hit her head to which she has a knot but thinks she may have a concussion, has been experiencing nausea and dizziness.  -did send patient to speak with team health-

## 2020-06-28 NOTE — Telephone Encounter (Signed)
Nurse Assessment Nurse: Durene Cal, RN, Brandi Date/Time (Eastern Time): 06/28/2020 11:24:30 AM Confirm and document reason for call. If symptomatic, describe symptoms. ---Caller states that she fell this weekend, hit her head and has been having dizziness and nausea since that happened. Caller states that this happened on Saturday night after a wedding where she had been drinking, she hit her head/face on the concrete floor. She feels like she is very unsteady when she walks. Has the patient had close contact with a person known or suspected to have the novel coronavirus illness OR traveled / lives in area with major community spread (including international travel) in the last 14 days from the onset of symptoms? * If Asymptomatic, screen for exposure and travel within the last 14 days. ---No Does the patient have any new or worsening symptoms? ---Yes Will a triage be completed? ---Yes Related visit to physician within the last 2 weeks? ---No Does the PT have any chronic conditions? (i.e. diabetes, asthma, this includes High risk factors for pregnancy, etc.) ---Yes List chronic conditions. ---Htn depression diabetic; metformin Is this a behavioral health or substance abuse call? ---NoPLEASE NOTE: All timestamps contained within this report are represented as [place] Standard Time. CONFIDENTIALTY NOTICE: This fax transmission is intended only for the addressee. It contains information that is legally privileged, confidential or otherwise protected from use or disclosure. If you are not the intended recipient, you are strictly prohibited from reviewing, disclosing, copying using or disseminating any of this information or taking any action in reliance on or regarding this information. If you have received this fax in error, please notify us immediately by telephone so that we can arrange for its return to [place]. Phone: [phone], Toll-Free: [phone], Fax: [phone] Page: 2 of 2 Call Id:  [id number] Guidelines Guideline Title Affirmed Question Affirmed Notes Nurse Date/Time Eilene Ghazi Time) Head Injury [1] ACUTE NEURO SYMPTOM AND [2] present now (DEFINITION: difficult to awaken OR confused thinking and talking OR slurred speech OR weakness of arms OR unsteady walking) Durene Cal, RN, Brandi 06/28/2020 11:24:41 AM Disp. Time Eilene Ghazi Time) Disposition Final User 06/28/2020 11:21:51 AM Send to Urgent Ala Dach 06/28/2020 11:32:32 AM 911 Outcome Documentation Durene Cal, RN, Velna Hatchet Reason: Refused 06/28/2020 11:29:56 AM Call EMS 911 Now Yes Durene Cal, RN, Berdie Ogren Disagree/Comply Disagree Caller Understands Yes PreDisposition Call Doctor Care Advice Given Per Guideline CALL EMS 911 NOW: * Immediate medical attention is needed. You need to hang up and call 911 (or an ambulance). Comments User: Ermalene Postin, RN Date/Time Eilene Ghazi Time): 06/28/2020 11:26:15 AM Slight COPD ; smoker User: Ermalene Postin, RN Date/Time Eilene Ghazi Time): 06/28/2020 11:27:24 AM Reports hitting head on the right side of her head and cant recall anything after. User: Ermalene Postin, RN Date/Time Eilene Ghazi Time): 06/28/2020 11:29:32 AM Caller reports her mother is goin into the office to see mr hunter today. She is refusing EMS. User: Ermalene Postin, RN Date/Time Eilene Ghazi Time): 06/28/2020 11:32:06 AM Caller reports she cant call 911 due to it possibly upsetting her younger brother who is over EMS (??) Reports she will attempt to go to the ED in a while after her mother gets back from the store. Caller stable at time of call. Referrals GO TO FACILITY REFUSE

## 2020-06-28 NOTE — Telephone Encounter (Signed)
Will wait on triage note

## 2020-06-29 NOTE — Telephone Encounter (Signed)
Patient states she did not go anywhere, still has symptoms but does not want to schedule a visit.

## 2020-07-08 ENCOUNTER — Telehealth: Payer: Self-pay | Admitting: Family Medicine

## 2020-07-08 NOTE — Telephone Encounter (Signed)
FYI

## 2020-07-08 NOTE — Telephone Encounter (Signed)
Nurse Assessment Nurse: Zorita Pang, RN, Deborah Date/Time (Eastern Time): 07/08/2020 10:43:25 AM Confirm and document reason for call. If symptomatic, describe symptoms. ---The caller states that she fell down her steps and she hit her forehead. She states that it has been hard but is now soft. No redness. Is blue. Has the patient had close contact with a person known or suspected to have the novel coronavirus illness OR traveled / lives in area with major community spread (including international travel) in the last 14 days from the onset of symptoms? * If Asymptomatic, screen for exposure and travel within the last 14 days. ---No Does the patient have any new or worsening symptoms? ---Yes Will a triage be completed? ---Yes Related visit to physician within the last 2 weeks? ---Yes Does the PT have any chronic conditions? (i.e. diabetes, asthma, this includes High risk factors for pregnancy, etc.) ---Yes List chronic conditions. ---diabetes, stent, hypertension Is this a behavioral health or substance abuse call? ---No Guidelines Guideline Title Affirmed Question Affirmed Notes Nurse Date/Time (Eastern Time) Head Injury Scalp swelling, bruise or pain Womble, RN, Neoma Laming 07/08/2020 10:48:15 AM Disp. Time Eilene Ghazi Time) Disposition Final User 07/08/2020 10:52:06 AM Home Care Yes Womble, RN, DeborahPLEASE NOTE: All timestamps contained within this report are represented as [place] Standard Time. CONFIDENTIALTY NOTICE: This fax transmission is intended only for the addressee. It contains information that is legally privileged, confidential or otherwise protected from use or disclosure. If you are not the intended recipient, you are strictly prohibited from reviewing, disclosing, copying using or disseminating any of this information or taking any action in reliance on or regarding this information. If you have received this fax in error, please notify us immediately by telephone so that we can  arrange for its return to [place]. Phone: [phone], Toll-Free: [phone], Fax: [phone] Page: 2 of 2 Call Id: [id number] Caller Disagree/Comply Comply Caller Understands Yes PreDisposition Home Care Care Advice Given Per Guideline CALL BACK IF: * You become worse

## 2020-07-11 ENCOUNTER — Other Ambulatory Visit: Payer: Self-pay | Admitting: Family Medicine

## 2020-08-18 ENCOUNTER — Other Ambulatory Visit: Payer: Self-pay

## 2020-08-18 ENCOUNTER — Ambulatory Visit (INDEPENDENT_AMBULATORY_CARE_PROVIDER_SITE_OTHER): Payer: Medicare Other

## 2020-08-18 ENCOUNTER — Encounter: Payer: Self-pay | Admitting: Family Medicine

## 2020-08-18 DIAGNOSIS — Z23 Encounter for immunization: Secondary | ICD-10-CM | POA: Diagnosis not present

## 2020-08-22 ENCOUNTER — Other Ambulatory Visit: Payer: Self-pay

## 2020-08-22 ENCOUNTER — Telehealth: Payer: Self-pay

## 2020-08-22 MED ORDER — CLONIDINE HCL 0.1 MG PO TABS: 0.1000 mg | ORAL_TABLET | Freq: Two times a day (BID) | ORAL | 3 refills | Status: DC

## 2020-08-22 NOTE — Telephone Encounter (Signed)
Script sent to pharmacy.

## 2020-08-22 NOTE — Telephone Encounter (Signed)
..   LAST APPOINTMENT DATE: 06/28/2020   NEXT APPOINTMENT DATE:@Visit  date not found  MEDICATION:cloNIDine (CATAPRES) 0.1 MG tablet    [place], [place]  **Let patient know to contact pharmacy at the end of the day to make sure medication is ready. **  ** Please notify patient to allow 48-72 hours to process**  **Encourage patient to contact the pharmacy for refills or they can request refills through [facility]**  CLINICAL FILLS OUT ALL BELOW:   LAST REFILL:  QTY:  REFILL DATE:    OTHER COMMENTS:    Okay for refill?  Please advise

## 2020-08-25 ENCOUNTER — Ambulatory Visit
Admission: RE | Admit: 2020-08-25 | Discharge: 2020-08-25 | Disposition: A | Discharge status: Home / Self Care | Payer: Medicare Other | Source: Ambulatory Visit | Attending: Family Medicine | Admitting: Family Medicine

## 2020-08-25 ENCOUNTER — Other Ambulatory Visit: Payer: Self-pay

## 2020-08-25 DIAGNOSIS — M8580 Other specified disorders of bone density and structure, unspecified site: Secondary | ICD-10-CM

## 2020-08-25 DIAGNOSIS — Z1231 Encounter for screening mammogram for malignant neoplasm of breast: Secondary | ICD-10-CM | POA: Diagnosis not present

## 2020-08-25 DIAGNOSIS — Z780 Asymptomatic menopausal state: Secondary | ICD-10-CM | POA: Diagnosis not present

## 2020-08-25 IMAGING — MG DIGITAL SCREENING BILAT W/ TOMO W/ CAD
8 series · 8 of 24 positions shown · non-contrast
Comparison: Previous exam(s).

CLINICAL DATA: Screening.

EXAM:
DIGITAL SCREENING BILATERAL MAMMOGRAM WITH TOMO AND CAD

[L MLO synth-2D]
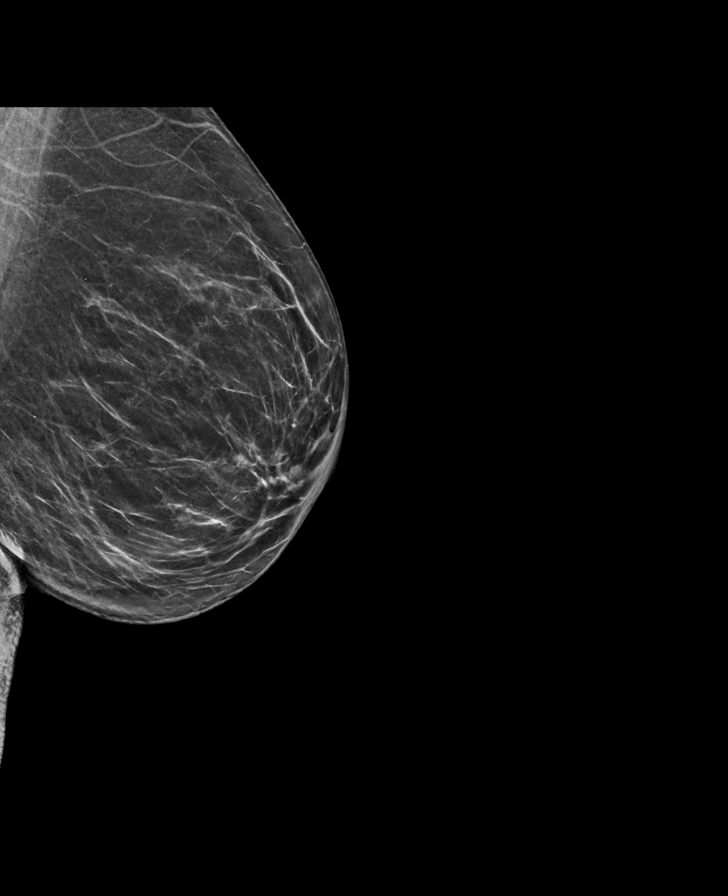

[L CC synth-2D]
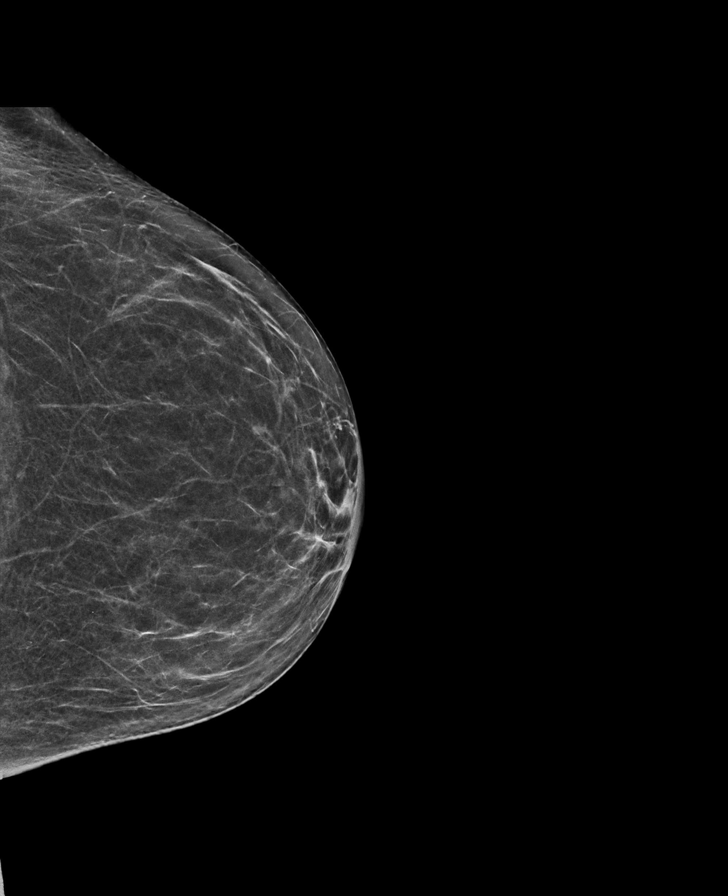

[R CC synth-2D]
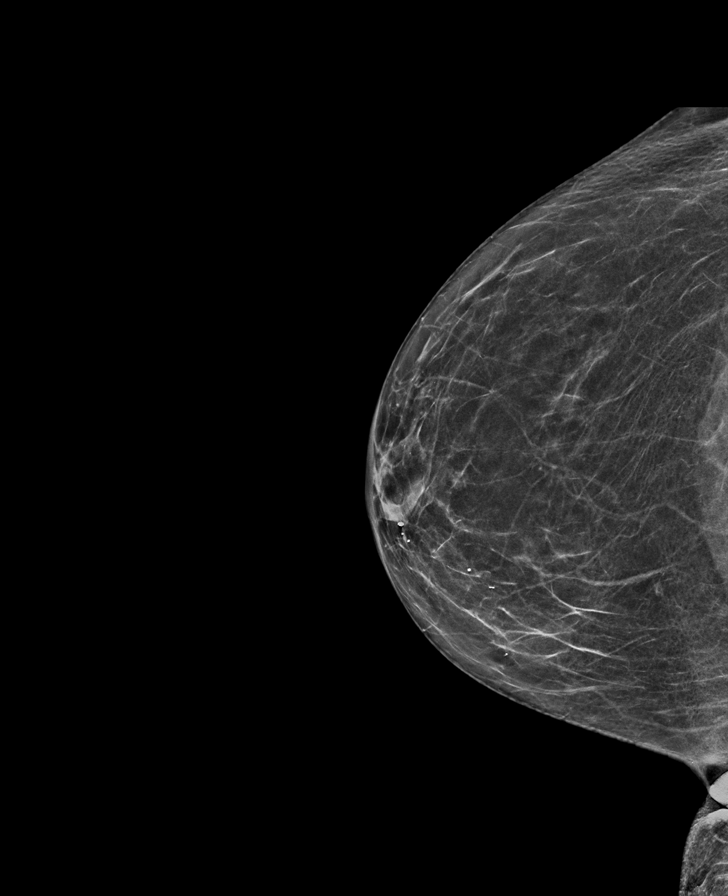

[R MLO synth-2D]
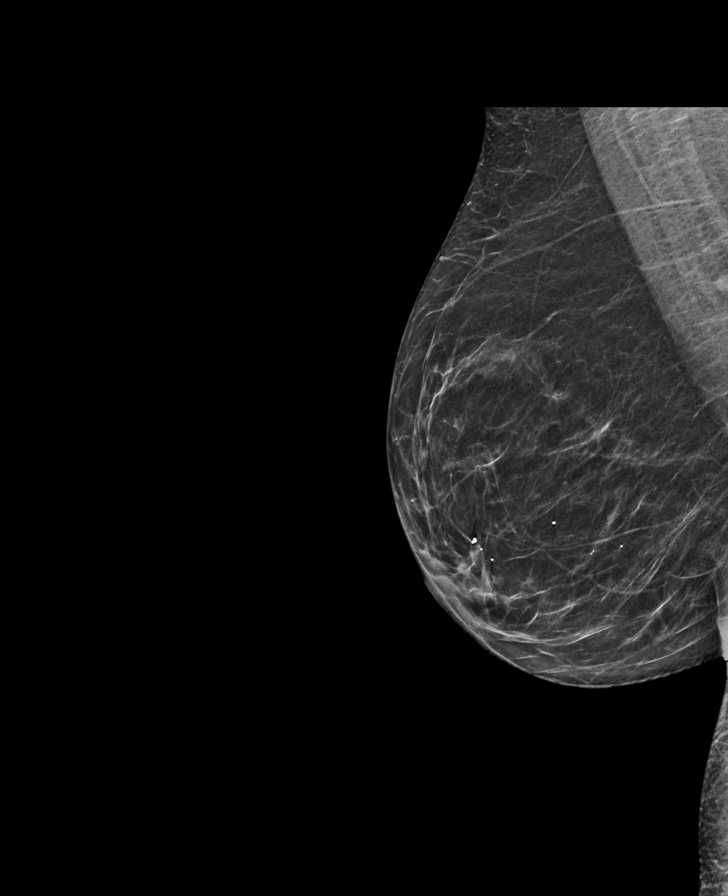

[L CC tomo · tomo slice 29/57.0]
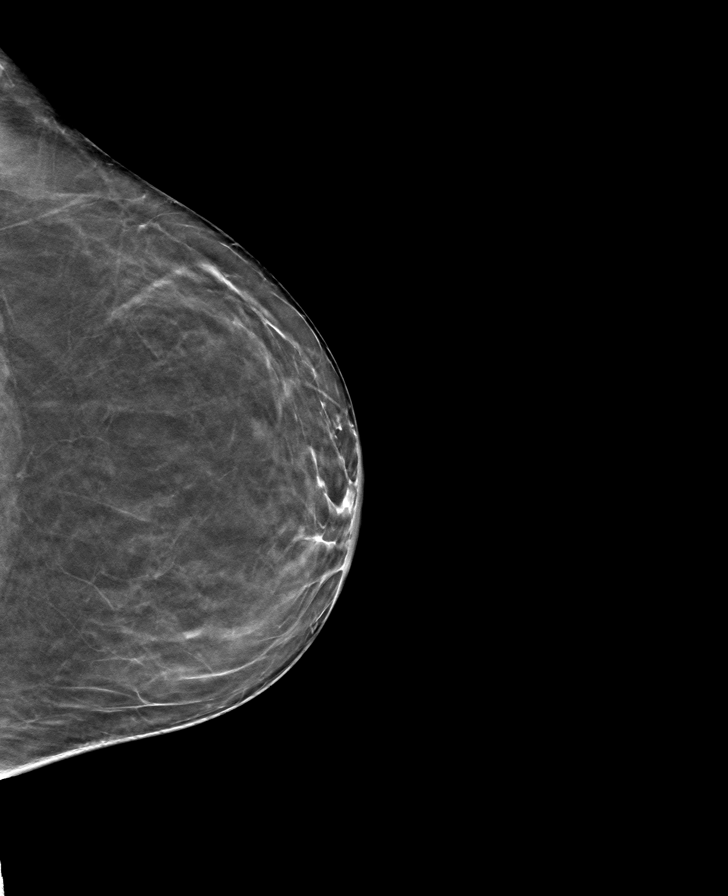

[R MLO tomo · tomo slice 32/63.0]
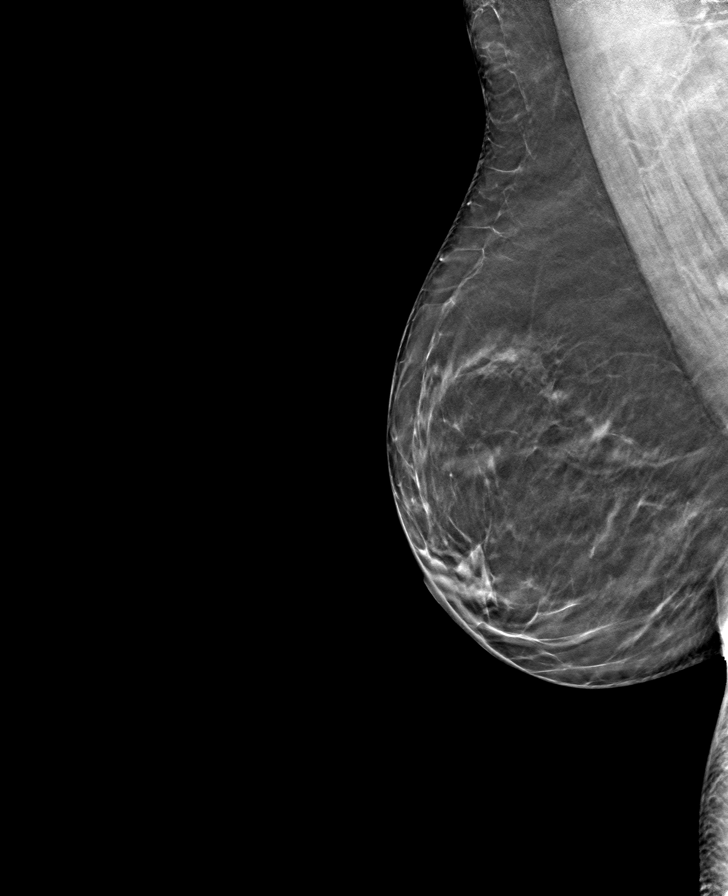

[R CC tomo · tomo slice 29/58.0]
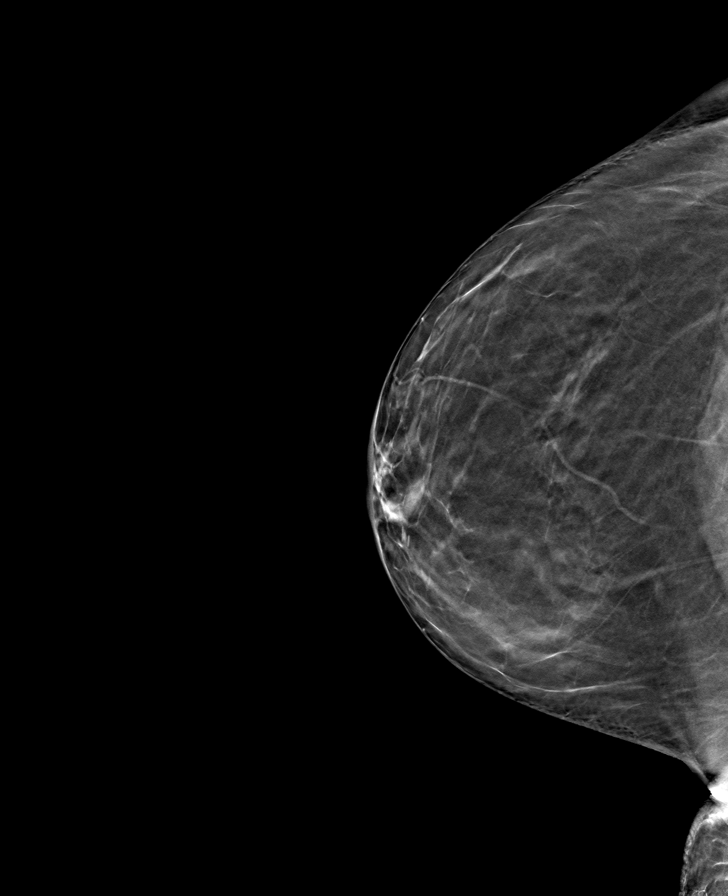

[L MLO tomo · tomo slice 31/60.0]
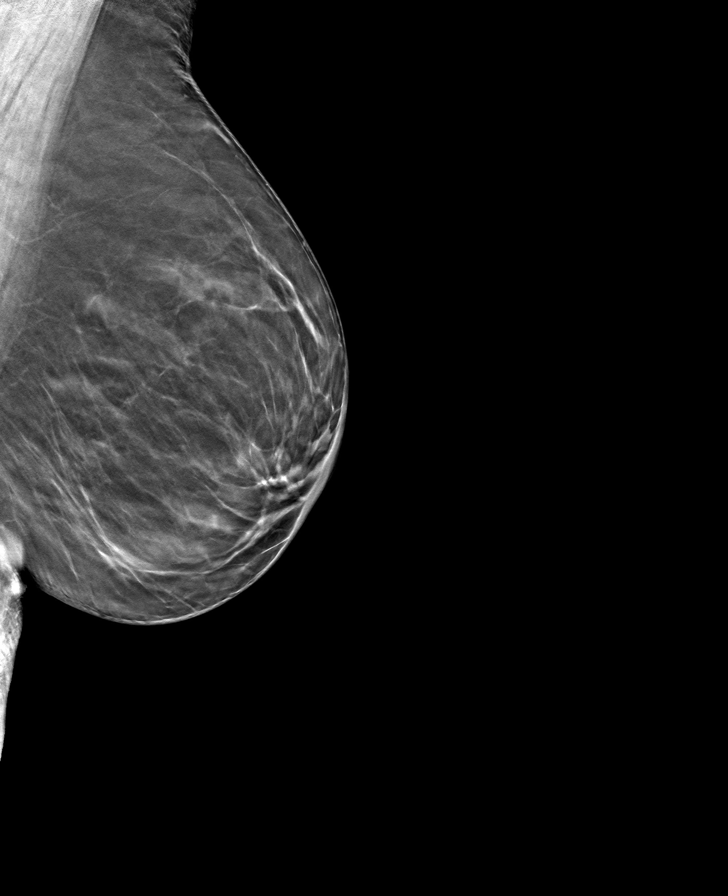

[8 of 24 positions shown; findings below may reference images not displayed]

ACR Breast Density Category b: There are scattered areas of
fibroglandular density.
FINDINGS: There are no findings suspicious for malignancy. Images were
processed with CAD.
IMPRESSION: No mammographic evidence of malignancy. A result letter of this
screening mammogram will be mailed directly to the patient.

RECOMMENDATION:
Screening mammogram in one year. (Code:[TQ])

BI-RADS CATEGORY  1: Negative.

## 2020-09-15 ENCOUNTER — Encounter: Payer: Self-pay | Admitting: Family Medicine

## 2020-09-15 ENCOUNTER — Other Ambulatory Visit: Payer: Self-pay

## 2020-09-15 ENCOUNTER — Ambulatory Visit (INDEPENDENT_AMBULATORY_CARE_PROVIDER_SITE_OTHER): Payer: Medicare Other | Admitting: Family Medicine

## 2020-09-15 VITALS — BP 120/70 | HR 71 | Temp 97.2°F | Wt 194.8 lb

## 2020-09-15 DIAGNOSIS — M545 Low back pain, unspecified: Secondary | ICD-10-CM | POA: Diagnosis not present

## 2020-09-15 DIAGNOSIS — R5383 Other fatigue: Secondary | ICD-10-CM | POA: Diagnosis not present

## 2020-09-15 DIAGNOSIS — F3341 Major depressive disorder, recurrent, in partial remission: Secondary | ICD-10-CM | POA: Diagnosis not present

## 2020-09-15 DIAGNOSIS — E119 Type 2 diabetes mellitus without complications: Secondary | ICD-10-CM | POA: Diagnosis not present

## 2020-09-15 DIAGNOSIS — E1169 Type 2 diabetes mellitus with other specified complication: Secondary | ICD-10-CM

## 2020-09-15 DIAGNOSIS — M8580 Other specified disorders of bone density and structure, unspecified site: Secondary | ICD-10-CM

## 2020-09-15 DIAGNOSIS — E785 Hyperlipidemia, unspecified: Secondary | ICD-10-CM

## 2020-09-15 DIAGNOSIS — G8929 Other chronic pain: Secondary | ICD-10-CM

## 2020-09-15 MED ORDER — DICLOFENAC SODIUM 75 MG PO TBEC
75.0000 mg | DELAYED_RELEASE_TABLET | Freq: Two times a day (BID) | ORAL | 0 refills | Status: DC
Start: 2020-09-15 — End: 2020-11-07

## 2020-09-15 MED ORDER — BACLOFEN 10 MG PO TABS
10.0000 mg | ORAL_TABLET | Freq: Three times a day (TID) | ORAL | 0 refills | Status: DC | PRN
Start: 2020-09-15 — End: 2021-02-07

## 2020-09-15 MED ORDER — ALPRAZOLAM 0.5 MG PO TABS
0.5000 mg | ORAL_TABLET | Freq: Every day | ORAL | 0 refills | Status: DC | PRN
Start: 2020-09-15 — End: 2021-02-01

## 2020-09-15 NOTE — Patient Instructions (Signed)
Please return in 3 months for your annual complete physical; please come fasting.  I will release your lab results to you on your MyChart account with further instructions. Please reply with any questions.   Start the anti-inflammatory twice daily and use the muscle relaxer as needed for you back pain.   Please go to our [facility] office to get your xrays done. You can walk in M-F between 8:30am- noon or 1pm - 5pm. Tell them you are there for xrays ordered by me. They will send me the results, then I will let you know the results with instructions.   Address: [address].  The Xray department is located in the basement.    If you have any questions or concerns, please don't hesitate to send me a message via MyChart or call the office at [phone]. Thank you for visiting with [place] today! It's our pleasure caring for you.   Back Exercises The following exercises strengthen the muscles that help to support the trunk and back. They also help to keep the lower back flexible. Doing these exercises can help to prevent back pain or lessen existing pain.  If you have back pain or discomfort, try doing these exercises 2-3 times each day or as told by your health care provider.  As your pain improves, do them once each day, but increase the number of times that you repeat the steps for each exercise (do more repetitions).  To prevent the recurrence of back pain, continue to do these exercises once each day or as told by your health care provider. Do exercises exactly as told by your health care provider and adjust them as directed. It is normal to feel mild stretching, pulling, tightness, or discomfort as you do these exercises, but you should stop right away if you feel sudden pain or your pain gets worse. Exercises Single knee to chest Repeat these steps 3-5 times for each leg: 1. Lie on your back on a firm bed or the floor with your legs extended. 2. Bring one knee to your  chest. Your other leg should stay extended and in contact with the floor. 3. Hold your knee in place by grabbing your knee or thigh with both hands and hold. 4. Pull on your knee until you feel a gentle stretch in your lower back or buttocks. 5. Hold the stretch for 10-30 seconds. 6. Slowly release and straighten your leg. Pelvic tilt Repeat these steps 5-10 times: 1. Lie on your back on a firm bed or the floor with your legs extended. 2. Bend your knees so they are pointing toward the ceiling and your feet are flat on the floor. 3. Tighten your lower abdominal muscles to press your lower back against the floor. This motion will tilt your pelvis so your tailbone points up toward the ceiling instead of pointing to your feet or the floor. 4. With gentle tension and even breathing, hold this position for 5-10 seconds. Cat-cow Repeat these steps until your lower back becomes more flexible: 1. Get into a hands-and-knees position on a firm surface. Keep your hands under your shoulders, and keep your knees under your hips. You may place padding under your knees for comfort. 2. Let your head hang down toward your chest. Contract your abdominal muscles and point your tailbone toward the floor so your lower back becomes rounded like the back of a cat. 3. Hold this position for 5 seconds. 4. Slowly lift your head, let  your abdominal muscles relax and point your tailbone up toward the ceiling so your back forms a sagging arch like the back of a cow. 5. Hold this position for 5 seconds.  Press-ups Repeat these steps 5-10 times: 1. Lie on your abdomen (face-down) on the floor. 2. Place your palms near your head, about shoulder-width apart. 3. Keeping your back as relaxed as possible and keeping your hips on the floor, slowly straighten your arms to raise the top half of your body and lift your shoulders. Do not use your back muscles to raise your upper torso. You may adjust the placement of your hands to  make yourself more comfortable. 4. Hold this position for 5 seconds while you keep your back relaxed. 5. Slowly return to lying flat on the floor.  Bridges Repeat these steps 10 times: 1. Lie on your back on a firm surface. 2. Bend your knees so they are pointing toward the ceiling and your feet are flat on the floor. Your arms should be flat at your sides, next to your body. 3. Tighten your buttocks muscles and lift your buttocks off the floor until your waist is at almost the same height as your knees. You should feel the muscles working in your buttocks and the back of your thighs. If you do not feel these muscles, slide your feet 1-2 inches farther away from your buttocks. 4. Hold this position for 3-5 seconds. 5. Slowly lower your hips to the starting position, and allow your buttocks muscles to relax completely. If this exercise is too easy, try doing it with your arms crossed over your chest. Abdominal crunches Repeat these steps 5-10 times: 1. Lie on your back on a firm bed or the floor with your legs extended. 2. Bend your knees so they are pointing toward the ceiling and your feet are flat on the floor. 3. Cross your arms over your chest. 4. Tip your chin slightly toward your chest without bending your neck. 5. Tighten your abdominal muscles and slowly raise your trunk (torso) high enough to lift your shoulder blades a tiny bit off the floor. Avoid raising your torso higher than that because it can put too much stress on your low back and does not help to strengthen your abdominal muscles. 6. Slowly return to your starting position. Back lifts Repeat these steps 5-10 times: 1. Lie on your abdomen (face-down) with your arms at your sides, and rest your forehead on the floor. 2. Tighten the muscles in your legs and your buttocks. 3. Slowly lift your chest off the floor while you keep your hips pressed to the floor. Keep the back of your head in line with the curve in your back. Your  eyes should be looking at the floor. 4. Hold this position for 3-5 seconds. 5. Slowly return to your starting position. Contact a health care provider if:  Your back pain or discomfort gets much worse when you do an exercise.  Your worsening back pain or discomfort does not lessen within 2 hours after you exercise. If you have any of these problems, stop doing these exercises right away. Do not do them again unless your health care provider says that you can. Get help right away if:  You develop sudden, severe back pain. If this happens, stop doing the exercises right away. Do not do them again unless your health care provider says that you can. This information is not intended to replace advice given to you by your health  care provider. Make sure you discuss any questions you have with your health care provider. Document Revised: 03/19/2019 Document Reviewed: 08/14/2018 Elsevier Patient Education  [place].

## 2020-09-15 NOTE — Progress Notes (Signed)
Subjective  CC:  Chief Complaint  Patient presents with  . Back Pain    bilateral lower back pain x 6 weeks, progressively worsening   . Fatigue    x 1 month, affecting ADL's, lives with 68 y.o. mother - assists taking care of her    HPI: Jessica Curry is a 69 y.o. female who presents to the office today to address the problems listed above in the chief complain  C/o low back pain x 6 weeks w/o radicular sxs. Affecting sleep. No leg weakness. No trauma. otc meds w/o relief.   Tired all the time: stressed due to primary care taker of her elderly mother and strained relationship. Chronic depression on high dose effexor and prn benzo.   Reviewed recent dexa scan results showing stable osteopenia  Diabetes f/u: reports feels like sugars are fine. Takes meds .denies sxs of hyperglycemia. No foot pain or sores. Due flu vaccine  HLD due for recheck on atorvastatin    Assessment  1. Chronic midline low back pain without sciatica   2. Osteopenia, unspecified location   3. Depression, major, recurrent, in partial remission ([place])   4. Fatigue, unspecified type   5. Controlled type 2 diabetes mellitus without complication, without long-term current use of insulin ([place])   6. Hyperlipidemia associated with type 2 diabetes mellitus ([place])      Plan   Back pain:  Check xrays. Start mm relaxer and nsaids.   Counseling done for depression and stressors.   Check labs to r/o other causes of fatigue  Check lipids and diabetes labs.   Follow up:  3 mo for cpe  Visit date not found  Orders Placed This Encounter  Procedures  . DG Lumbar Spine Complete  . CBC with Differential/Platelet  . COMPLETE METABOLIC PANEL WITH GFR  . Lipid panel  . Hemoglobin A1c  . TSH   Meds ordered this encounter  Medications  . baclofen (LIORESAL) 10 MG tablet    Sig: Take 1 tablet (10 mg total) by mouth 3 (three) times daily as needed for muscle spasms.    Dispense:  30 each    Refill:  0  .  diclofenac (VOLTAREN) 75 MG EC tablet    Sig: Take 1 tablet (75 mg total) by mouth 2 (two) times daily.    Dispense:  30 tablet    Refill:  0  . ALPRAZolam (XANAX) 0.5 MG tablet    Sig: Take 1 tablet (0.5 mg total) by mouth daily as needed for anxiety.    Dispense:  30 tablet    Refill:  0      I reviewed the patients updated PMH, FH, and SocHx.    Patient Active Problem List   Diagnosis Date Noted  . History of heart artery stent 02/10/2020    Priority: High  . Simple chronic bronchitis ([place]) 02/11/2018    Priority: High  . Hyperlipidemia associated with type 2 diabetes mellitus ([place]) 06/04/2017    Priority: High  . Diabetes mellitus type II, controlled ([place]) 09/10/2016    Priority: High  . CAD (coronary artery disease), native coronary artery 09/10/2016    Priority: High  . Hypertension associated with diabetes ([place]) 09/10/2016    Priority: High  . Smoker 09/10/2016    Priority: High  . Depression, major, recurrent, in partial remission ([place])     Priority: High  . History of colon polyps 08/12/2018    Priority: Medium  . Osteopenia 06/10/2017  Priority: Medium  . Encounter for screening for lung cancer 02/10/2020  . Mild intermittent asthma 08/06/2019   Current Meds  Medication Sig  . albuterol (PROAIR HFA) 108 (90 Base) MCG/ACT inhaler Inhale 2 puffs into the lungs every 6 (six) hours as needed for wheezing or shortness of breath.  Marland Kitchen aspirin 81 MG tablet Take 81 mg by mouth daily.    Marland Kitchen atorvastatin (LIPITOR) 20 MG tablet TAKE 1 TABLET BY MOUTH  DAILY  . cloNIDine (CATAPRES) 0.1 MG tablet Take 1 tablet (0.1 mg total) by mouth 2 (two) times daily.  . diphenhydrAMINE (BENADRYL) 25 MG tablet Take 25 mg by mouth every 6 (six) hours as needed.  Marland Kitchen lisinopril-hydrochlorothiazide (ZESTORETIC) 20-25 MG tablet TAKE 1 TABLET BY MOUTH  DAILY  . metFORMIN (GLUCOPHAGE) 1000 MG tablet TAKE 1 TABLET BY MOUTH TWO  TIMES DAILY WITH MEALS  . Multiple Vitamins-Minerals (MULTIVITAMIN  WITH MINERALS) tablet Take 1 tablet by mouth daily. Centrum silver for women   . venlafaxine XR (EFFEXOR-XR) 75 MG 24 hr capsule TAKE THREE CAPSULES BY MOUTH DAILY WITH BREAKFAST    Allergies: Patient has No Known Allergies. Family History: Patient family history includes AAA (abdominal aortic aneurysm) in her mother; Arthritis in her brother and mother; Dementia in her maternal grandmother; Heart attack in her brother; Hyperlipidemia in her brother; Hypertension in her brother; Hypothyroidism in her mother; Kidney disease in her mother; Other in her father; Ovarian cancer in her mother; Stroke in her brother. Social History:  Patient  reports that she has been smoking cigarettes. She has a 14.40 pack-year smoking history. She has never used smokeless tobacco. She reports current alcohol use of about 4.0 standard drinks of alcohol per week. She reports that she does not use drugs.  Review of Systems: Constitutional: Negative for fever malaise or anorexia Cardiovascular: negative for chest pain Respiratory: negative for SOB or persistent cough Gastrointestinal: negative for abdominal pain  Objective  Vitals: BP 120/70   Pulse 71   Temp (!) 97.2 F (36.2 C) (Temporal)   Wt 194 lb 12.8 oz (88.4 kg)   LMP  (LMP Unknown)   SpO2 98%   BMI 30.51 kg/m  General: no acute distress , A&Ox3 HEENT: PEERL, conjunctiva normal, neck is supple Cardiovascular:  RRR without murmur or gallop.  Respiratory:  Good breath sounds bilaterally, CTAB with normal respiratory effort Skin:  Warm, no rashes Back: mild paravertebral ttp w/ spasm. No spinal ttp. Fairly good range of motion neg slr bilaterally    Commons side effects, risks, benefits, and alternatives for medications and treatment plan prescribed today were discussed, and the patient expressed understanding of the given instructions. Patient is instructed to call or message via MyChart if he/she has any questions or concerns regarding our  treatment plan. No barriers to understanding were identified. We discussed Red Flag symptoms and signs in detail. Patient expressed understanding regarding what to do in case of urgent or emergency type symptoms.   Medication list was reconciled, printed and provided to the patient in AVS. Patient instructions and summary information was reviewed with the patient as documented in the AVS. This note was prepared with assistance of Dragon voice recognition software. Occasional wrong-word or sound-a-like substitutions may have occurred due to the inherent limitations of voice recognition software  This visit occurred during the SARS-CoV-2 public health emergency.  Safety protocols were in place, including screening questions prior to the visit, additional usage of staff PPE, and extensive cleaning of exam room while observing  appropriate contact time as indicated for disinfecting solutions.

## 2020-09-16 LAB — CBC WITH DIFFERENTIAL/PLATELET
Absolute Monocytes: 462 cells/uL (ref 200–950)
Basophils Absolute: 39 cells/uL (ref 0–200)
Basophils Relative: 0.6 %
Eosinophils Absolute: 130 cells/uL (ref 15–500)
Eosinophils Relative: 2 %
HCT: 35.7 % (ref 35.0–45.0)
Hemoglobin: 11.8 g/dL (ref 11.7–15.5)
Lymphs Abs: 891 cells/uL (ref 850–3900)
MCH: 28.7 pg (ref 27.0–33.0)
MCHC: 33.1 g/dL (ref 32.0–36.0)
MCV: 86.9 fL (ref 80.0–100.0)
MPV: 9.9 fL (ref 7.5–12.5)
Monocytes Relative: 7.1 %
Neutro Abs: 4979 cells/uL (ref 1500–7800)
Neutrophils Relative %: 76.6 %
Platelets: 302 10*3/uL (ref 140–400)
RBC: 4.11 10*6/uL (ref 3.80–5.10)
RDW: 13.7 % (ref 11.0–15.0)
Total Lymphocyte: 13.7 %
WBC: 6.5 10*3/uL (ref 3.8–10.8)

## 2020-09-16 LAB — COMPLETE METABOLIC PANEL WITH GFR
AG Ratio: 1.5 (calc) (ref 1.0–2.5)
ALT: 11 U/L (ref 6–29)
AST: 13 U/L (ref 10–35)
Albumin: 3.8 g/dL (ref 3.6–5.1)
Alkaline phosphatase (APISO): 113 U/L (ref 37–153)
BUN/Creatinine Ratio: 13 (calc) (ref 6–22)
BUN: 13 mg/dL (ref 7–25)
CO2: 27 mmol/L (ref 20–32)
Calcium: 10.5 mg/dL — ABNORMAL HIGH (ref 8.6–10.4)
Chloride: 95 mmol/L — ABNORMAL LOW (ref 98–110)
Creat: 1 mg/dL — ABNORMAL HIGH (ref 0.50–0.99)
GFR, Est African American: 67 mL/min/{1.73_m2} (ref >=60)
GFR, Est Non African American: 57 mL/min/{1.73_m2} — ABNORMAL LOW (ref >=60)
Globulin: 2.5 g/dL (calc) (ref 1.9–3.7)
Glucose, Bld: 238 mg/dL — ABNORMAL HIGH (ref 65–99)
Potassium: 4.5 mmol/L (ref 3.5–5.3)
Sodium: 130 mmol/L — ABNORMAL LOW (ref 135–146)
Total Bilirubin: 0.5 mg/dL (ref 0.2–1.2)
Total Protein: 6.3 g/dL (ref 6.1–8.1)

## 2020-09-16 LAB — HEMOGLOBIN A1C
Hgb A1c MFr Bld: 6.5 % of total Hgb — ABNORMAL HIGH (ref <5.7)
Mean Plasma Glucose: 140 (calc)
eAG (mmol/L): 7.7 (calc)

## 2020-09-16 LAB — LIPID PANEL
Cholesterol: 144 mg/dL (ref <200)
HDL: 39 mg/dL — ABNORMAL LOW (ref >=50)
LDL Cholesterol (Calc): 78 mg/dL (calc)
Non-HDL Cholesterol (Calc): 105 mg/dL (calc) (ref <130)
Total CHOL/HDL Ratio: 3.7 (calc) (ref <5.0)
Triglycerides: 174 mg/dL — ABNORMAL HIGH (ref <150)

## 2020-09-16 LAB — TSH: TSH: 1.55 mIU/L (ref 0.40–4.50)

## 2020-09-16 NOTE — Progress Notes (Signed)
Please call patient: I have reviewed his/her lab results. Her results look ok. Sugar was very high yesterday but overall control is fine with a1c at 6.5. Hopefully, the medications will help her back pain.

## 2020-09-27 ENCOUNTER — Telehealth: Payer: Self-pay | Admitting: Family Medicine

## 2020-09-27 NOTE — Telephone Encounter (Signed)
Left message with household member for patient to call back and schedule Medicare Annual Wellness Visit (AWV) either virtually/audio only OR in office. Whatever the patients preference is.  Last AWV 09/08/19; please schedule at anytime with [facility]Nurse Health Advisor at [facility].  This should be a 45 minute visit.

## 2020-09-30 ENCOUNTER — Other Ambulatory Visit: Payer: Self-pay

## 2020-09-30 ENCOUNTER — Ambulatory Visit (INDEPENDENT_AMBULATORY_CARE_PROVIDER_SITE_OTHER)
Admission: RE | Admit: 2020-09-30 | Discharge: 2020-09-30 | Disposition: A | Discharge status: Home / Self Care | Payer: Medicare Other | Source: Ambulatory Visit | Attending: Acute Care | Admitting: Acute Care

## 2020-09-30 DIAGNOSIS — F17210 Nicotine dependence, cigarettes, uncomplicated: Secondary | ICD-10-CM

## 2020-09-30 DIAGNOSIS — Z87891 Personal history of nicotine dependence: Secondary | ICD-10-CM | POA: Diagnosis not present

## 2020-09-30 DIAGNOSIS — Z122 Encounter for screening for malignant neoplasm of respiratory organs: Secondary | ICD-10-CM

## 2020-10-06 NOTE — Progress Notes (Signed)
Please call patient and let them  know their  low dose Ct was read as a Lung RADS 2: nodules that are benign in appearance and behavior with a very low likelihood of becoming a clinically active cancer due to size or lack of growth. Recommendation per radiology is for a repeat LDCT in 12 months. .Please let them  know we will order and schedule their  annual screening scan for 09/2021. Please let them  know there was notation of CAD on their  scan.  Please remind the patient  that this is a non-gated exam therefore degree or severity of disease  cannot be determined. Please have them  follow up with their PCP regarding potential risk factor modification, dietary therapy or pharmacologic therapy if clinically indicated. Pt.  is  currently on statin therapy. Please place order for annual  screening scan for  09/2021 and fax results to PCP. Thanks so much. 

## 2020-10-10 ENCOUNTER — Other Ambulatory Visit: Payer: Self-pay | Admitting: *Deleted

## 2020-10-10 DIAGNOSIS — Z87891 Personal history of nicotine dependence: Secondary | ICD-10-CM

## 2020-10-10 DIAGNOSIS — F17210 Nicotine dependence, cigarettes, uncomplicated: Secondary | ICD-10-CM

## 2020-11-02 ENCOUNTER — Other Ambulatory Visit: Payer: Self-pay | Admitting: Family Medicine

## 2020-11-07 ENCOUNTER — Other Ambulatory Visit: Payer: Self-pay | Admitting: Family Medicine

## 2020-11-11 ENCOUNTER — Telehealth: Payer: Self-pay | Admitting: Family Medicine

## 2020-11-11 NOTE — Chronic Care Management (AMB) (Signed)
  Chronic Care Management   Note  11/11/2020 Name: EUDELL JULIAN MRN: [id number] DOB: [birth year]  Devona Konig is a 69 y.o. year old female who is a primary care patient of Leamon Arnt, MD. I reached out to Devona Konig by phone today in response to a referral sent by Ms. Kayren Eaves Shuster's PCP, Leamon Arnt, MD.   Ms. Doyel was given information about Chronic Care Management services today including:  1. CCM service includes personalized support from designated clinical staff supervised by her physician, including individualized plan of care and coordination with other care providers 2. 24/7 contact phone numbers for assistance for urgent and routine care needs. 3. Service will only be billed when office clinical staff spend 20 minutes or more in a month to coordinate care. 4. Only one practitioner may furnish and bill the service in a calendar month. 5. The patient may stop CCM services at any time (effective at the end of the month) by phone call to the office staff.   Patient agreed to services and verbal consent obtained.   Follow up plan:   Lauretta Grill [facility] Scheduler

## 2020-12-09 ENCOUNTER — Telehealth: Payer: Self-pay

## 2020-12-09 NOTE — Chronic Care Management (AMB) (Signed)
Chronic Care Management Pharmacy Assistant   Name: Jessica Curry  MRN: [id number] DOB: August 07, 1951  Reason for Encounter: Medication Review/ Initial Visit   Jessica Curry,  70 y.o. , female presents for their Initial CCM visit with the clinical pharmacist In office.  PCP : Leamon Arnt, MD  Allergies:  No Known Allergies  Medications: Outpatient Encounter Medications as of 12/09/2020  Medication Sig  . albuterol (PROAIR HFA) 108 (90 Base) MCG/ACT inhaler Inhale 2 puffs into the lungs every 6 (six) hours as needed for wheezing or shortness of breath.  . ALPRAZolam (XANAX) 0.5 MG tablet Take 1 tablet (0.5 mg total) by mouth daily as needed for anxiety.  Marland Kitchen aspirin 81 MG tablet Take 81 mg by mouth daily.    Marland Kitchen atorvastatin (LIPITOR) 20 MG tablet TAKE ONE TABLET BY MOUTH EVERY OTHER DAY  . baclofen (LIORESAL) 10 MG tablet Take 1 tablet (10 mg total) by mouth 3 (three) times daily as needed for muscle spasms.  . cloNIDine (CATAPRES) 0.1 MG tablet Take 1 tablet (0.1 mg total) by mouth 2 (two) times daily.  . diclofenac (VOLTAREN) 75 MG EC tablet TAKE ONE TABLET BY MOUTH TWICE DAILY  . diphenhydrAMINE (BENADRYL) 25 MG tablet Take 25 mg by mouth every 6 (six) hours as needed.  Marland Kitchen lisinopril-hydrochlorothiazide (ZESTORETIC) 20-25 MG tablet TAKE 1 TABLET BY MOUTH  DAILY  . metFORMIN (GLUCOPHAGE) 1000 MG tablet TAKE 1 TABLET BY MOUTH  TWICE DAILY WITH MEALS  . Multiple Vitamins-Minerals (MULTIVITAMIN WITH MINERALS) tablet Take 1 tablet by mouth daily. Centrum silver for women   . nitroGLYCERIN (NITROSTAT) 0.4 MG SL tablet Place 1 tablet (0.4 mg total) under the tongue every 5 (five) minutes as needed for chest pain.  Marland Kitchen venlafaxine XR (EFFEXOR-XR) 75 MG 24 hr capsule TAKE THREE CAPSULES BY MOUTH DAILY WITH BREAKFAST   No facility-administered encounter medications on file as of 12/09/2020.    Current Diagnosis: Patient Active Problem List   Diagnosis Date Noted  . Encounter for screening  for lung cancer 02/10/2020  . History of heart artery stent 02/10/2020  . Mild intermittent asthma 08/06/2019  . History of colon polyps 08/12/2018  . Simple chronic bronchitis ([place]) 02/11/2018  . Osteopenia 06/10/2017  . Hyperlipidemia associated with type 2 diabetes mellitus ([place]) 06/04/2017  . Diabetes mellitus type II, controlled ([place]) 09/10/2016  . CAD (coronary artery disease), native coronary artery 09/10/2016  . Hypertension associated with diabetes ([place]) 09/10/2016  . Smoker 09/10/2016  . Depression, major, recurrent, in partial remission ([place])     Have you seen any other providers since your last visit?   Patient states she has not seen any other medical providers since her last visit with her PCP Leamon Arnt, MD.  Any changes in your medications or health?  Patient states she has not had any changes to her medications or health.  Any side effects from any medications?   Patient states she has not had any side effects from any of her medications.   Do you have any symptoms or problems not managed by your medications?  Patient denies having any symptoms or problems that are not managed by her medications at this time.  Any concerns about your health right now?  Patient states her only concerns right now is that she always feels tired.  Has your provider asked that you check blood pressure, blood sugar, or follow special diet at home?  Patient states she does not currently check her  blood pressure or blood sugars at home. Patient states she does not follow any special diet at this time.  Do you get any type of exercise on a regular basis?  Patient states she does not have a regular exercise routine but she does get plenty of physical activity outside in the yard doing yard work. Patient states she is active at least 30 minutes a day, 5 times a week.  Can you think of a goal you would like to reach for your health?  Patient states her goal would be to lose about 30  pounds.  Do you have any problems getting your medications?  Patient states she does not have any problems getting her medications at this time.  Is there anything that you would like to discuss during the appointment?   Patient state she would like to discuss her medications. Patient states she is currently taking Venlafaxine. Patient states she has heard this medication is one of the hardest drugs to get off of. Patient states she is concerned may "never be able to get off of it".  Please bring medications and supplements to appointment  April D Calhoun, [place] Pharmacist Assistant [phone]   Follow-Up:  Pharmacist Review

## 2020-12-14 ENCOUNTER — Ambulatory Visit: Payer: Medicare Other

## 2020-12-26 ENCOUNTER — Ambulatory Visit (INDEPENDENT_AMBULATORY_CARE_PROVIDER_SITE_OTHER): Payer: Medicare Other | Admitting: Family Medicine

## 2020-12-26 ENCOUNTER — Other Ambulatory Visit: Payer: Self-pay

## 2020-12-26 ENCOUNTER — Ambulatory Visit: Payer: Medicare Other

## 2020-12-26 ENCOUNTER — Encounter: Payer: Self-pay | Admitting: Family Medicine

## 2020-12-26 VITALS — BP 160/82 | HR 67 | Temp 97.2°F | Resp 16 | Ht 67.0 in | Wt 198.6 lb

## 2020-12-26 DIAGNOSIS — F3341 Major depressive disorder, recurrent, in partial remission: Secondary | ICD-10-CM

## 2020-12-26 DIAGNOSIS — E785 Hyperlipidemia, unspecified: Secondary | ICD-10-CM

## 2020-12-26 DIAGNOSIS — E1159 Type 2 diabetes mellitus with other circulatory complications: Secondary | ICD-10-CM | POA: Diagnosis not present

## 2020-12-26 DIAGNOSIS — F17200 Nicotine dependence, unspecified, uncomplicated: Secondary | ICD-10-CM

## 2020-12-26 DIAGNOSIS — G8929 Other chronic pain: Secondary | ICD-10-CM | POA: Diagnosis not present

## 2020-12-26 DIAGNOSIS — E1169 Type 2 diabetes mellitus with other specified complication: Secondary | ICD-10-CM

## 2020-12-26 DIAGNOSIS — E119 Type 2 diabetes mellitus without complications: Secondary | ICD-10-CM | POA: Diagnosis not present

## 2020-12-26 DIAGNOSIS — M8580 Other specified disorders of bone density and structure, unspecified site: Secondary | ICD-10-CM

## 2020-12-26 DIAGNOSIS — Z0000 Encounter for general adult medical examination without abnormal findings: Secondary | ICD-10-CM | POA: Diagnosis not present

## 2020-12-26 DIAGNOSIS — M545 Low back pain, unspecified: Secondary | ICD-10-CM | POA: Diagnosis not present

## 2020-12-26 DIAGNOSIS — I152 Hypertension secondary to endocrine disorders: Secondary | ICD-10-CM

## 2020-12-26 DIAGNOSIS — I2510 Atherosclerotic heart disease of native coronary artery without angina pectoris: Secondary | ICD-10-CM | POA: Diagnosis not present

## 2020-12-26 MED ORDER — ATORVASTATIN CALCIUM 20 MG PO TABS: 20.0000 mg | ORAL_TABLET | Freq: Every evening | ORAL | 3 refills | Status: DC

## 2020-12-26 MED ORDER — CLONIDINE HCL 0.1 MG PO TABS: 0.1000 mg | ORAL_TABLET | Freq: Three times a day (TID) | ORAL | 3 refills | Status: DC

## 2020-12-26 MED ORDER — MELOXICAM 15 MG PO TABS: 15.0000 mg | ORAL_TABLET | Freq: Every day | ORAL | 0 refills | Status: DC

## 2020-12-26 NOTE — Patient Instructions (Signed)
Please return in 3 months for diabetes follow up and blood pressure recheck.   I will release your lab results to you on your MyChart account with further instructions. Please reply with any questions.   Try the meloxicam daily for your back pain.  Increase your clonidine to 3x/day.   If you have any questions or concerns, please don't hesitate to send me a message via MyChart or call the office at [phone]. Thank you for visiting with [place] today! It's our pleasure caring for you.

## 2020-12-26 NOTE — Patient Instructions (Addendum)
Jessica Curry,  Thank you for taking the time to review your medications with me today.  I have included our care plan/goals in the following pages. Please review and call me at [phone] with any questions!  Thanks! Ellin Mayhew, Pharm.D., BCGP Clinical Pharmacist [facility] [phone]  Goals Addressed            This Visit's Progress   . PharmD Care Plan       CARE PLAN ENTRY (see longitudinal plan of care for additional care plan information)  Current Barriers:  . Chronic Disease Management support, education, and care coordination needs related to Hypertension, Hyperlipidemia, Diabetes, and Tobacco use   Hypertension BP Readings from Last 3 Encounters:  12/26/20 (!) 160/82  09/15/20 120/70  05/11/20 130/80   . Pharmacist Clinical Goal(s): o Over the next 365 days, patient will work with PharmD and providers to maintain BP goal <140/90 . Current regimen:  o Clonidine 0.1 mg three times daily o Lisinopril-hydrochlorothiazide 20-25 mg once daily  . Interventions: o We discussed - home monitoring of BP/documenting readings in log.  o Water-based activites at [facility] encouraged - swimming, water aerobics, water walking.  . Patient self care activities - Over the next 180 days, patient will: o Check BP at least once every 1-2 days, document, and provide at future appointments o Ensure daily salt intake < 2300 mg/day  Hyperlipidemia Lab Results  Component Value Date/Time   LDLCALC 78 09/15/2020 10:25 AM   LDLDIRECT 96.0 09/17/2019 02:33 PM   . Pharmacist Clinical Goal(s): o Over the next 180 days, patient will work with PharmD and providers to achieve LDL goal < 70 . Current regimen:  o Atorvastatin 20 mg once every day  . Interventions: o Reviewed side effects/tolerability - no problems noted. . Patient self care activities - Over the next 180 days, patient will: o Continue current management  Diabetes Lab Results   Component Value Date/Time   HGBA1C 6.5 (H) 09/15/2020 10:25 AM   HGBA1C 6.5 (A) 05/11/2020 02:40 PM   HGBA1C 6.9 (H) 02/10/2020 03:11 PM   . Pharmacist Clinical Goal(s): o Over the next 180 days, patient will work with PharmD and providers to maintain A1c goal <6.5% . Current regimen:  o Metformin 1000 mg twice daily . Interventions: o Reviewed diet/exercise - Maintain a healthy weight and exercise regularly, as directed by your health care provider. Eat healthy foods, such as: Lean proteins, complex carbohydrates, fresh fruits and vegetables, low-fat dairy products, healthy fats. . Patient self care activities - Over the next 365 days, patient will: o Check blood sugar only if advised, document, and provide at future appointments o Contact provider with any episodes of hypoglycemia  Smoking Cessation . Pharmacist Clinical Goal(s) o Over the next 365 days, patient will work with PharmD and providers to provide support in reducing number of daily cigarettes/stopping smoking and ensure medication safety . Current regimen:  o N/a . Interventions: o We discussed cutting down to 4-5 cigarettes per day before incorporating nicotine replacement therapies such as patch and gum. . Patient self care activities - Over the next 365 days, patient will: o Reach out with any questions related to smoking cessation  Medication management . Pharmacist Clinical Goal(s): o Over the next 365 days, patient will work with PharmD and providers to maintain optimal medication adherence . Current pharmacy: [facility], [facility] . Interventions o Comprehensive medication review performed. o Continue current medication management  strategy . Patient self care activities - Over the next 365 days, patient will: o Focus on medication adherence by taking medications as prescribed o Report any questions or concerns to PharmD and/or provider(s) Initial goal documentation.      Jessica Curry was given  information about Chronic Care Management services today including:  1. CCM service includes personalized support from designated clinical staff supervised by her physician, including individualized plan of care and coordination with other care providers 2. 24/7 contact phone numbers for assistance for urgent and routine care needs. 3. Standard insurance, coinsurance, copays and deductibles apply for chronic care management only during months in which we provide at least 20 minutes of these services. Most insurances cover these services at 100%, however patients may be responsible for any copay, coinsurance and/or deductible if applicable. This service may help you avoid the need for more expensive face-to-face services. 4. Only one practitioner may furnish and bill the service in a calendar month. 5. The patient may stop CCM services at any time (effective at the end of the month) by phone call to the office staff.  Patient agreed to services and verbal consent obtained.   The patient verbalized understanding of instructions provided today and agreed to receive a mailed copy of patient instruction and/or educational materials. Telephone follow up appointment with pharmacy team member scheduled for: See next appointment with "Care Management Staff" under "What's Next" below.   Hypertension, Adult High blood pressure (hypertension) is when the force of blood pumping through the arteries is too strong. The arteries are the blood vessels that carry blood from the heart throughout the body. Hypertension forces the heart to work harder to pump blood and may cause arteries to become narrow or stiff. Untreated or uncontrolled hypertension can cause a heart attack, heart failure, a stroke, kidney disease, and other problems. A blood pressure reading consists of a higher number over a lower number. Ideally, your blood pressure should be below 120/80. The first ("top") number is called the systolic pressure. It  is a measure of the pressure in your arteries as your heart beats. The second ("bottom") number is called the diastolic pressure. It is a measure of the pressure in your arteries as the heart relaxes. What are the causes? The exact cause of this condition is not known. There are some conditions that result in or are related to high blood pressure. What increases the risk? Some risk factors for high blood pressure are under your control. The following factors may make you more likely to develop this condition:  Smoking.  Having type 2 diabetes mellitus, high cholesterol, or both.  Not getting enough exercise or physical activity.  Being overweight.  Having too much fat, sugar, calories, or salt (sodium) in your diet.  Drinking too much alcohol. Some risk factors for high blood pressure may be difficult or impossible to change. Some of these factors include:  Having chronic kidney disease.  Having a family history of high blood pressure.  Age. Risk increases with age.  Race. You may be at higher risk if you are African American.  Gender. Men are at higher risk than women before age 66. After age 65, women are at higher risk than men.  Having obstructive sleep apnea.  Stress. What are the signs or symptoms? High blood pressure may not cause symptoms. Very high blood pressure (hypertensive crisis) may cause:  Headache.  Anxiety.  Shortness of breath.  Nosebleed.  Nausea and vomiting.  Vision changes.  Severe chest pain.  Seizures. How is this diagnosed? This condition is diagnosed by measuring your blood pressure while you are seated, with your arm resting on a flat surface, your legs uncrossed, and your feet flat on the floor. The cuff of the blood pressure monitor will be placed directly against the skin of your upper arm at the level of your heart. It should be measured at least twice using the same arm. Certain conditions can cause a difference in blood pressure  between your right and left arms. Certain factors can cause blood pressure readings to be lower or higher than normal for a short period of time:  When your blood pressure is higher when you are in a health care provider's office than when you are at home, this is called white coat hypertension. Most people with this condition do not need medicines.  When your blood pressure is higher at home than when you are in a health care provider's office, this is called masked hypertension. Most people with this condition may need medicines to control blood pressure. If you have a high blood pressure reading during one visit or you have normal blood pressure with other risk factors, you may be asked to:  Return on a different day to have your blood pressure checked again.  Monitor your blood pressure at home for 1 week or longer. If you are diagnosed with hypertension, you may have other blood or imaging tests to help your health care provider understand your overall risk for other conditions. How is this treated? This condition is treated by making healthy lifestyle changes, such as eating healthy foods, exercising more, and reducing your alcohol intake. Your health care provider may prescribe medicine if lifestyle changes are not enough to get your blood pressure under control, and if:  Your systolic blood pressure is above 130.  Your diastolic blood pressure is above 80. Your personal target blood pressure may vary depending on your medical conditions, your age, and other factors. Follow these instructions at home: Eating and drinking  Eat a diet that is high in fiber and potassium, and low in sodium, added sugar, and fat. An example eating plan is called the DASH (Dietary Approaches to Stop Hypertension) diet. To eat this way: ? Eat plenty of fresh fruits and vegetables. Try to fill one half of your plate at each meal with fruits and vegetables. ? Eat whole grains, such as whole-wheat pasta, brown  rice, or whole-grain bread. Fill about one fourth of your plate with whole grains. ? Eat or drink low-fat dairy products, such as skim milk or low-fat yogurt. ? Avoid fatty cuts of meat, processed or cured meats, and poultry with skin. Fill about one fourth of your plate with lean proteins, such as fish, chicken without skin, beans, eggs, or tofu. ? Avoid pre-made and processed foods. These tend to be higher in sodium, added sugar, and fat.  Reduce your daily sodium intake. Most people with hypertension should eat less than 1,500 mg of sodium a day.  Do not drink alcohol if: ? Your health care provider tells you not to drink. ? You are pregnant, may be pregnant, or are planning to become pregnant.  If you drink alcohol: ? Limit how much you use to:  0-1 drink a day for women.  0-2 drinks a day for men. ? Be aware of how much alcohol is in your drink. In the [place] one drink equals one 12 oz bottle of beer (355 mL), one  5 oz glass of wine (148 mL), or one 1 oz glass of hard liquor (44 mL).   Lifestyle  Work with your health care provider to maintain a healthy body weight or to lose weight. Ask what an ideal weight is for you.  Get at least 30 minutes of exercise most days of the week. Activities may include walking, swimming, or biking.  Include exercise to strengthen your muscles (resistance exercise), such as Pilates or lifting weights, as part of your weekly exercise routine. Try to do these types of exercises for 30 minutes at least 3 days a week.  Do not use any products that contain nicotine or tobacco, such as cigarettes, e-cigarettes, and chewing tobacco. If you need help quitting, ask your health care provider.  Monitor your blood pressure at home as told by your health care provider.  Keep all follow-up visits as told by your health care provider. This is important.   Medicines  Take over-the-counter and prescription medicines only as told by your health care provider.  Follow directions carefully. Blood pressure medicines must be taken as prescribed.  Do not skip doses of blood pressure medicine. Doing this puts you at risk for problems and can make the medicine less effective.  Ask your health care provider about side effects or reactions to medicines that you should watch for. Contact a health care provider if you:  Think you are having a reaction to a medicine you are taking.  Have headaches that keep coming back (recurring).  Feel dizzy.  Have swelling in your ankles.  Have trouble with your vision. Get help right away if you:  Develop a severe headache or confusion.  Have unusual weakness or numbness.  Feel faint.  Have severe pain in your chest or abdomen.  Vomit repeatedly.  Have trouble breathing. Summary  Hypertension is when the force of blood pumping through your arteries is too strong. If this condition is not controlled, it may put you at risk for serious complications.  Your personal target blood pressure may vary depending on your medical conditions, your age, and other factors. For most people, a normal blood pressure is less than 120/80.  Hypertension is treated with lifestyle changes, medicines, or a combination of both. Lifestyle changes include losing weight, eating a healthy, low-sodium diet, exercising more, and limiting alcohol. This information is not intended to replace advice given to you by your health care provider. Make sure you discuss any questions you have with your health care provider. Document Revised: 07/23/2018 Document Reviewed: 07/23/2018 Elsevier Patient Education  2021 [employer].   High Cholesterol  High cholesterol is a condition in which the blood has high levels of a white, waxy substance similar to fat (cholesterol). The liver makes all the cholesterol that the body needs. The human body needs small amounts of cholesterol to help build cells. A person gets extra or excess cholesterol from  the food that he or she eats. The blood carries cholesterol from the liver to the rest of the body. If you have high cholesterol, deposits (plaques) may build up on the walls of your arteries. Arteries are the blood vessels that carry blood away from your heart. These plaques make the arteries narrow and stiff. Cholesterol plaques increase your risk for heart attack and stroke. Work with your health care provider to keep your cholesterol levels in a healthy range. What increases the risk? The following factors may make you more likely to develop this condition:  Eating foods that  are high in animal fat (saturated fat) or cholesterol.  Being overweight.  Not getting enough exercise.  A family history of high cholesterol (familial hypercholesterolemia).  Use of tobacco products.  Having diabetes. What are the signs or symptoms? There are no symptoms of this condition. How is this diagnosed? This condition may be diagnosed based on the results of a blood test.  If you are older than 70 years of age, your health care provider may check your cholesterol levels every 4-6 years.  You may be checked more often if you have high cholesterol or other risk factors for heart disease. The blood test for cholesterol measures:  "Bad" cholesterol, or LDL cholesterol. This is the main type of cholesterol that causes heart disease. The desired level is less than 100 mg/dL.  "Good" cholesterol, or HDL cholesterol. HDL helps protect against heart disease by cleaning the arteries and carrying the LDL to the liver for processing. The desired level for HDL is 60 mg/dL or higher.  Triglycerides. These are fats that your body can store or burn for energy. The desired level is less than 150 mg/dL.  Total cholesterol. This measures the total amount of cholesterol in your blood and includes LDL, HDL, and triglycerides. The desired level is less than 200 mg/dL. How is this treated? This condition may be treated  with:  Diet changes. You may be asked to eat foods that have more fiber and less saturated fats or added sugar.  Lifestyle changes. These may include regular exercise, maintaining a healthy weight, and quitting use of tobacco products.  Medicines. These are given when diet and lifestyle changes have not worked. You may be prescribed a statin medicine to help lower your cholesterol levels. Follow these instructions at home: Eating and drinking  Eat a healthy, balanced diet. This diet includes: ? Daily servings of a variety of fresh, frozen, or canned fruits and vegetables. ? Daily servings of whole grain foods that are rich in fiber. ? Foods that are low in saturated fats and trans fats. These include poultry and fish without skin, lean cuts of meat, and low-fat dairy products. ? A variety of fish, especially oily fish that contain omega-3 fatty acids. Aim to eat fish at least 2 times a week.  Avoid foods and drinks that have added sugar.  Use healthy cooking methods, such as roasting, grilling, broiling, baking, poaching, steaming, and stir-frying. Do not fry your food except for stir-frying.   Lifestyle  Get regular exercise. Aim to exercise for a total of 150 minutes a week. Increase your activity level by doing activities such as gardening, walking, and taking the stairs.  Do not use any products that contain nicotine or tobacco, such as cigarettes, e-cigarettes, and chewing tobacco. If you need help quitting, ask your health care provider.   General instructions  Take over-the-counter and prescription medicines only as told by your health care provider.  Keep all follow-up visits as told by your health care provider. This is important. Where to find more information  American Heart Association: www.heart.org  National Heart, Lung, and Blood Institute: https://wilson-eaton.com/ Contact a health care provider if:  You have trouble achieving or maintaining a healthy diet or  weight.  You are starting an exercise program.  You are unable to stop smoking. Get help right away if:  You have chest pain.  You have trouble breathing.  You have any symptoms of a stroke. "BE FAST" is an easy way to remember the main warning signs  of a stroke: ? B - Balance. Signs are dizziness, sudden trouble walking, or loss of balance. ? E - Eyes. Signs are trouble seeing or a sudden change in vision. ? F - Face. Signs are sudden weakness or numbness of the face, or the face or eyelid drooping on one side. ? A - Arms. Signs are weakness or numbness in an arm. This happens suddenly and usually on one side of the body. ? S - Speech. Signs are sudden trouble speaking, slurred speech, or trouble understanding what people say. ? T - Time. Time to call emergency services. Write down what time symptoms started.  You have other signs of a stroke, such as: ? A sudden, severe headache with no known cause. ? Nausea or vomiting. ? Seizure. These symptoms may represent a serious problem that is an emergency. Do not wait to see if the symptoms will go away. Get medical help right away. Call your local emergency services (911 in the [place] Do not drive yourself to the hospital. Summary  Cholesterol plaques increase your risk for heart attack and stroke. Work with your health care provider to keep your cholesterol levels in a healthy range.  Eat a healthy, balanced diet, get regular exercise, and maintain a healthy weight.  Do not use any products that contain nicotine or tobacco, such as cigarettes, e-cigarettes, and chewing tobacco.  Get help right away if you have any symptoms of a stroke. This information is not intended to replace advice given to you by your health care provider. Make sure you discuss any questions you have with your health care provider. Document Revised: 10/12/2019 Document Reviewed: 10/12/2019 Elsevier Patient Education  2021 [employer].

## 2020-12-26 NOTE — Progress Notes (Signed)
Chronic Care Management Pharmacy Name: Jessica Curry     MRN: [id number]     DOB: [birth year]  Chief Complaint/ HPI Jessica Curry, 70 y.o., female, presents for their initial CCM visit with the clinical pharmacist in office.  PCP: Jessica Arnt, Jessica Curry Encounter Diagnoses  Name Primary?  . Hypertension associated with diabetes ([place]) Yes  . Hyperlipidemia associated with type 2 diabetes mellitus ([place])   . Controlled type 2 diabetes mellitus without complication, without long-term current use of insulin ([place])   . Smoker     Office Visits:  12/27/2019 (PCP): CPE. 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 On effexor, benzo  Patient Active Problem List   Diagnosis Date Noted  . 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 Encounter for screening for lung cancer 02/10/2020  . History of heart artery stent 02/10/2020  . Mild intermittent asthma 08/06/2019  . Serrated polyp of colon 08/12/2018  . Simple chronic bronchitis ([place]) 02/11/2018  . Osteopenia 06/10/2017  . Hyperlipidemia associated with type 2 diabetes mellitus ([place]) 06/04/2017  . Diabetes mellitus type II, controlled ([place]) 09/10/2016  . CAD (coronary artery disease), native coronary artery 09/10/2016  . Hypertension associated with diabetes ([place]) 09/10/2016  . Smoker 09/10/2016  . Depression, major, recurrent, in partial remission [facility])    Past Surgical History:  Procedure Laterality Date  . CORONARY ANGIOPLASTY WITH STENT PLACEMENT     2012, [place]  . DILATION AND CURETTAGE OF UTERUS    . TONSILLECTOMY  1968   Family History  Problem Relation Age of Onset  . Arthritis Mother   . Ovarian cancer Mother   . AAA (abdominal aortic aneurysm) Mother   . Kidney disease Mother   . Hypothyroidism Mother   . Other Father        does not know family history for father  . Stroke Brother        retired  Jessica Curry. Jessica Curry  . Heart attack Brother   . Arthritis Brother        hip replacement  . Hyperlipidemia Brother   . Hypertension Brother   . Dementia Maternal Grandmother   . Colon cancer Neg Hx   . Esophageal cancer Neg Hx   . Rectal cancer Neg Hx   . Stomach cancer Neg Hx    Social History   Social History Narrative   Divorced. Did not ask about children.       Lives with mother   BS undergrad.    Now struggles to find work and has financial struggles- getting on medicare has helped.    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 Does not "mow or blow"   No Known Allergies Outpatient Encounter Medications as of 12/26/2020  Medication Sig  . albuterol (PROAIR HFA) 108 (90 Base) MCG/ACT inhaler Inhale 2 puffs into the lungs every 6 (six) hours as needed for wheezing or shortness of breath.  . ALPRAZolam (XANAX) 0.5 MG tablet Take 1 tablet (0.5 mg total) by mouth daily as needed for anxiety.  Marland Kitchen aspirin 81 MG tablet Take 81 mg by mouth daily.  . baclofen (LIORESAL) 10 MG tablet Take 1 tablet (10 mg total) by mouth 3 (three) times daily as needed for muscle spasms.  . diphenhydrAMINE (BENADRYL) 25 MG tablet Take 25 mg  by mouth every 6 (six) hours as needed.  Marland Kitchen lisinopril-hydrochlorothiazide (ZESTORETIC) 20-25 MG tablet TAKE 1 TABLET BY MOUTH  DAILY  . metFORMIN (GLUCOPHAGE) 1000 MG tablet TAKE 1 TABLET BY MOUTH  TWICE DAILY WITH MEALS  . Multiple Vitamins-Minerals (MULTIVITAMIN WITH MINERALS) tablet Take 1 tablet by mouth daily. Centrum silver for women  . nitroGLYCERIN (NITROSTAT) 0.4 MG SL tablet Place 1 tablet (0.4 mg total) under the tongue every 5 (five) minutes as needed for chest Curry.  Marland Kitchen venlafaxine XR (EFFEXOR-XR) 75 MG 24 hr capsule TAKE THREE CAPSULES BY MOUTH DAILY WITH BREAKFAST  . [DISCONTINUED] atorvastatin (LIPITOR) 20 MG tablet TAKE ONE TABLET BY MOUTH EVERY OTHER DAY  . [DISCONTINUED] cloNIDine  (CATAPRES) 0.1 MG tablet Take 1 tablet (0.1 mg total) by mouth 2 (two) times daily.  . [DISCONTINUED] diclofenac (VOLTAREN) 75 MG EC tablet TAKE ONE TABLET BY MOUTH TWICE DAILY   No facility-administered encounter medications on file as of 12/26/2020.   Patient Care Team    Relationship Specialty Notifications Start End  Jessica Arnt, Jessica Curry PCP - General Family Medicine  05/11/20   Jessica Pain, Jessica Curry PCP - Cardiology Cardiology Admissions 03/27/18   Jessica Curry, [facility]  Pharmacist  11/11/20   Jessica Curry, [facility] Pharmacist Pharmacist  11/11/20    Current Diagnosis/Assessment: Goals Addressed            This Visit's Progress   . PharmD Care Plan       CARE PLAN ENTRY (see longitudinal plan of care for additional care plan information)  Current Barriers:  . Chronic Disease Management support, education, and care coordination needs related to Hypertension, Hyperlipidemia, Diabetes, and Tobacco use   Hypertension BP Readings from Last 3 Encounters:  12/26/20 (!) 160/82  09/15/20 120/70  05/11/20 130/80   . Pharmacist Clinical Goal(s): o Over the next 365 days, patient will work with PharmD and providers to maintain BP goal <140/90 . Current regimen:  o Clonidine 0.1 mg three times daily o Lisinopril-hydrochlorothiazide 20-25 mg once daily  . Interventions: o We discussed - home monitoring of BP/documenting readings in log.  o Water-based activites at [facility] encouraged - swimming, water aerobics, water walking.  . Patient self care activities - Over the next 180 days, patient will: o Check BP at least once every 1-2 days, document, and provide at future appointments o Ensure daily salt intake < 2300 mg/day  Hyperlipidemia Lab Results  Component Value Date/Time   LDLCALC 78 09/15/2020 10:25 AM   LDLDIRECT 96.0 09/17/2019 02:33 PM   . Pharmacist Clinical Goal(s): o Over the next 180 days, patient will work with PharmD and providers to achieve LDL goal < 70 . Current regimen:   o Atorvastatin 20 mg once every day  . Interventions: o Reviewed side effects/tolerability - no problems noted. . Patient self care activities - Over the next 180 days, patient will: o Continue current management  Diabetes Lab Results  Component Value Date/Time   HGBA1C 6.5 (H) 09/15/2020 10:25 AM   HGBA1C 6.5 (A) 05/11/2020 02:40 PM   HGBA1C 6.9 (H) 02/10/2020 03:11 PM   . Pharmacist Clinical Goal(s): o Over the next 180 days, patient will work with PharmD and providers to maintain A1c goal <6.5% . Current regimen:  o Metformin 1000 mg twice daily . Interventions: o Reviewed diet/exercise - Maintain a healthy weight and exercise regularly, as directed by your health care provider. Eat healthy foods, such as: Lean proteins, complex carbohydrates, fresh fruits and vegetables, low-fat dairy  products, healthy fats. . Patient self care activities - Over the next 365 days, patient will: o Check blood sugar only if advised, document, and provide at future appointments o Contact provider with any episodes of hypoglycemia  Smoking Cessation . Pharmacist Clinical Goal(s) o Over the next 365 days, patient will work with PharmD and providers to provide support in reducing number of daily cigarettes/stopping smoking and ensure medication safety . Current regimen:  o N/a . Interventions: o We discussed cutting down to 4-5 cigarettes per day before incorporating nicotine replacement therapies such as patch and gum. . Patient self care activities - Over the next 365 days, patient will: o Reach out with any questions related to smoking cessation  Medication management . Pharmacist Clinical Goal(s): o Over the next 365 days, patient will work with PharmD and providers to maintain optimal medication adherence . Current pharmacy: [facility] . Interventions o Comprehensive medication review performed. o Continue current medication management strategy . Patient self care  activities - Over the next 365 days, patient will: o Focus on medication adherence by taking medications as prescribed o Report any questions or concerns to PharmD and/or provider(s) Initial goal documentation.      Hypertension   BP goal <140/90  BP Readings from Last 3 Encounters:  12/26/20 (!) 160/82  09/15/20 120/70  05/11/20 130/80    BMP Latest Ref Rng & Units 09/15/2020 02/10/2020 09/17/2019  Glucose 65 - 99 mg/dL 238(H) 104(H) 147(H)  BUN 7 - 25 mg/dL 13 20 22   Creatinine 0.50 - 0.99 mg/dL 1.00(H) 0.95 1.05  BUN/Creat Ratio 6 - 22 (calc) 13 - -  Sodium 135 - 146 mmol/L 130(L) 132(L) 136  Potassium 3.5 - 5.3 mmol/L 4.5 4.5 4.2  Chloride 98 - 110 mmol/L 95(L) 97 101  CO2 20 - 32 mmol/L 27 28 26   Calcium 8.6 - 10.4 mg/dL 10.5(H) 10.7(H) 10.6(H)   BP elevated at physical today. Reports less than 833 systolic when taken by her brother. Lost 5 pounds, would like to lose another 15 pounds. Might try weight watchers but unsure of commitment due to time spend helping mother. No sandwiches at night, fruit/cereal for breakfast. Lots of yard work - primary form of exercise. Swam/played squash in school/college.  Denies dizziness. Patient is currently at goal on the following medications:  . Lisinopril-hctz 20-25 mg once daily  . Clonidine 0.1 mg three times daily (12/26/2020 dose increase)  We discussed - home monitoring of BP/documenting readings in log.  Water-based activites at [employer] encouraged - swimming, water aerobics, water walking.   Plan  Continue current medications. Labs drawn today for CPE.  Diabetes   A1c goal < 7%  Lab Results  Component Value Date/Time   HGBA1C 6.5 (H) 09/15/2020 10:25 AM   HGBA1C 6.5 (A) 05/11/2020 02:40 PM   HGBA1C 6.9 (H) 02/10/2020 03:11 PM   HGBA1C 6.3 (A) 09/17/2019 02:09 PM   HGBA1C 6.8 (A) 01/06/2019 04:45 PM   HGBA1C 7.4 (H) 05/26/2018 03:24 PM   HGBA1C 6.9 (H) 12/02/2017 11:41 AM   HGBA1C 6.0 09/10/2016 04:58 PM   GFR 58.39 (L)  02/10/2020 03:11 PM   GFR 52.09 (L) 09/17/2019 02:33 PM   GFR 59.48 (L) 05/26/2018 03:24 PM   GFR 59.57 (L) 12/02/2017 11:41 AM   GFR 61.96 09/10/2016 04:58 PM    Denies side effects/intolerance. a1c 6.5% 09/15/2020. Patient is currently at goal on: Marland Kitchen Metformin 1000 mg twice daily  Reviewed diet/exercise - Maintain a healthy weight and exercise  regularly, as directed by your health care provider. Eat healthy foods, such as: Lean proteins, complex carbohydrates, fresh fruits and vegetables, low-fat dairy products, healthy fats.  Plan  Continue current medications.  Hyperlipidemia   LDL goal < 70  Lipid Panel     Component Value Date/Time   CHOL 144 09/15/2020 1025   CHOL 164 09/17/2019 1433   CHOL 162 05/26/2018 1524   TRIG 174 (H) 09/15/2020 1025   TRIG 226.0 (H) 09/17/2019 1433   TRIG 205.0 (H) 05/26/2018 1524   HDL 39 (L) 09/15/2020 1025   HDL 41.40 09/17/2019 1433   HDL 39.00 (L) 05/26/2018 1524   LDLCALC 78 09/15/2020 1025   LDLDIRECT 96.0 09/17/2019 1433   LDLDIRECT 100.0 05/26/2018 1524   LDLDIRECT 140.0 12/02/2017 1141    Hepatic Function Latest Ref Rng & Units 09/15/2020 02/10/2020 09/17/2019  Total Protein 6.1 - 8.1 g/dL 6.3 6.7 6.8  Albumin 3.5 - 5.2 g/dL - 4.0 4.4  AST 10 - 35 U/L 13 16 17   ALT 6 - 29 U/L 11 16 16   Alk Phosphatase 39 - 117 U/L - 119(H) 117  Total Bilirubin 0.2 - 1.2 mg/dL 0.5 0.3 0.3    Previous medications: atorvastatin 20 mg once daily, 20 mg every OTHER day  Worked way up to 6 days per week of atorvastatin 20 mg, will try 20 mg daily as of today. Patient is currently at goal the following medications:  . Atorvastatin 20 mg once every OTHER day  CAD/Stent placement 2012, [place]. Denies any abnormal bruising, bleeding from nose or gums or blood in urine or stool. Patient is currently controlled on the following medications:  . Aspirin 81 mg once daily  Reviewed side effects/tolerability - no problems noted.  Plan  Continue current  medications.  Nicotine Abuse     Social History   Tobacco Use  Smoking Status Current Every Day Smoker  . Packs/day: 0.30  . Years: 48.00  . Pack years: 14.40  . Types: Cigarettes  Smokeless Tobacco Never Used  Tobacco Comment   handouts provided   Previous medications: none.  8-10 cigarettes per day currently. Patient smokes Within 30 minutes of waking. Patient triggers include: food, alcohol, stress. Previous quit attempts included: none - no attempts to quit ever per ppt.  Patient is currently on the following medications:  . None   We discussed: cutting down to 4-5 cigarettes per day before incorporating nicotine replacement therapies such as patch and gum.  Plan  Will discuss at later date, try to discuss at 66 month telephone f/u visit.  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 Requesting refill on alprazolam 0.5 mg, #10 left.  Denies side effects, reports ongoing benefit of taking medication. Patient is currently controlled on the following medications:  . Venlafaxine 75 mg - three capsules (225 mg) once daily with breakfast  Also taking alprazolam 1 tablet (0.5 mg) once daily as needed for anxiety.   Plan  Pt requesting refill on alprazolam Rx, #10 left. Continue current medications.  Vaccines   Immunization History  Administered Date(s) Administered  . Fluad Quad(high Dose 65+) 08/05/2019, 08/18/2020  . Influenza, High Dose Seasonal PF 08/20/2017, 08/06/2018  . Influenza-Unspecified 09/24/2016  . PFIZER(Purple Top)SARS-COV-2 Vaccination 12/15/2019, 01/05/2020  . Pneumococcal Conjugate-13 09/10/2016  .  Pneumococcal Polysaccharide-23 12/02/2017  . Tdap 08/20/2018, 12/05/2018  . Zoster Recombinat (Shingrix) 06/05/2017, 09/18/2017    Reports to have received third pfizer dose. Reviewed and discussed patient's vaccination history.    Plan  No recommendations.   Medication Management / Care Coordination   Receives prescription medications from:  [facility], [address] [address] Phone: [phone] Fax: [phone]  [facility], [address], [address] [place], [place] [address] Phone: [phone] Fax: [phone]  Requesting refill on alprazolam, otherwise no complaints with med mgmt.  Plan  Continue current medication management strategy. ___________________________ SDOH (Social Determinants of Health) assessments performed: Yes. Future Appointments  Date Time Provider [place]  06/13/2021  3:30 PM [facility] CCM PHARMACIST [facility]EC  Visit follow-up:  . CPA follow-up: n/a . RPH follow-up: 6 month f/u telephone visit - smoking cessation, review diet/exercise.  Jessica Curry, Pharm.D., BCGP Clinical Pharmacist [facility] [phone]

## 2020-12-26 NOTE — Progress Notes (Signed)
Subjective  Chief Complaint  Patient presents with  . Annual Exam    Non-fasting  . Back Pain    Lower back pain, wanting to increase diclofenac dosage   . Fatigue    HPI: Jessica Curry is a 70 y.o. female who presents to [facility] today for a Female Wellness Visit. She also has the concerns and/or needs as listed above in the chief complaint. These will be addressed in addition to the Health Maintenance Visit.   Wellness Visit: annual visit with health maintenance review and exam without Pap   Health maintenance: Mammogram normal and up-to-date.  Colonoscopies are up-to-date.  Had history of serrated polyp.  5-year recall.  Overall doing better.  Eating well.  No new concerns.  Immunizations are up-to-date.  She is eligible for her Covid booster.  Eye exam is up-to-date. Chronic disease f/u and/or acute problem visit: (deemed necessary to be done in addition to the wellness visit):  Diabetes follow up: Her diabetic control is reported as Unchanged.  It has been well controlled.  She denies symptoms of high or low blood sugars.  She completes her medicine daily. She denies exertional CP or SOB or symptomatic hypoglycemia. She denies foot sores or paresthesias.  No retinopathy.  Hyperlipidemia: She has been able to increase her Crestor to almost daily dosing.  Tolerating well.  Nonfasting for lab recheck today.  Goal LDL is less than 70.  Hypertension: Elevated today in the office.  Home readings tend to be 130s to 140s over 80s to 90s.  She denies chest pain.  She does have heart disease.  Chronic low back pain: Responded very well to diclofenac.  She would like to use daily twice a day if possible.  She is on baclofen for muscle spasms but does not feel this makes too much of a difference.  Smoking: She is working on cutting back.  She is down to about 8 cigarettes/day.  Continues to struggle with the idea of completely quitting.  She has had lung cancer  screening which was negative.  She does have congestion and wheezing at times.  Depression with anxiety: On high-dose Effexor.  Improved.  Continues to struggle with difficult home situation.  Medications are helpful.  Osteopenia: Reviewed most recent bone density which was stable.  No fractures.   Immunization History  Administered Date(s) Administered  . Fluad Quad(high Dose 65+) 08/05/2019, 08/18/2020  . Influenza, High Dose Seasonal PF 08/20/2017, 08/06/2018  . Influenza-Unspecified 09/24/2016  . PFIZER(Purple Top)SARS-COV-2 Vaccination 12/15/2019, 01/05/2020  . Pneumococcal Conjugate-13 09/10/2016  . Pneumococcal Polysaccharide-23 12/02/2017  . Tdap 08/20/2018, 12/05/2018  . Zoster Recombinat (Shingrix) 06/05/2017, 09/18/2017    Diabetes Related Lab Review: Lab Results  Component Value Date   HGBA1C 6.5 (H) 09/15/2020   HGBA1C 6.5 (A) 05/11/2020   HGBA1C 6.9 (H) 02/10/2020    No results found for: Derl Barrow Lab Results  Component Value Date   CREATININE 1.00 (H) 09/15/2020   BUN 13 09/15/2020   NA 130 (L) 09/15/2020   K 4.5 09/15/2020   CL 95 (L) 09/15/2020   CO2 27 09/15/2020   Lab Results  Component Value Date   CHOL 144 09/15/2020   CHOL 164 09/17/2019   CHOL 162 05/26/2018   Lab Results  Component Value Date   HDL 39 (L) 09/15/2020   HDL 41.40 09/17/2019   HDL 39.00 (L) 05/26/2018   Lab Results  Component Value Date   LDLCALC 78 09/15/2020  Lab Results  Component Value Date   TRIG 174 (H) 09/15/2020   TRIG 226.0 (H) 09/17/2019   TRIG 205.0 (H) 05/26/2018   Lab Results  Component Value Date   CHOLHDL 3.7 09/15/2020   CHOLHDL 4 09/17/2019   CHOLHDL 4 05/26/2018   Lab Results  Component Value Date   LDLDIRECT 96.0 09/17/2019   LDLDIRECT 100.0 05/26/2018   LDLDIRECT 140.0 12/02/2017   The 10-year ASCVD risk score Mikey Bussing DC Jr., et al., 2013) is: 45%   Values used to calculate the score:     Age: 29 years     Sex: Female     Is  Non-Hispanic African American: No     Diabetic: Yes     Tobacco smoker: Yes     Systolic Blood Pressure: 124 mmHg     Is BP treated: Yes     HDL Cholesterol: 39 mg/dL     Total Cholesterol: 144 mg/dL  BP Readings from Last 3 Encounters:  12/26/20 (!) 160/82  09/15/20 120/70  05/11/20 130/80   Wt Readings from Last 3 Encounters:  12/26/20 198 lb 9.6 oz (90.1 kg)  09/15/20 194 lb 12.8 oz (88.4 kg)  05/11/20 201 lb 3.2 oz (91.3 kg)    Health Maintenance  Topic Date Due  . COVID-19 Vaccine (3 - Booster for Pfizer series) 07/04/2020  . OPHTHALMOLOGY EXAM  03/04/2021  . HEMOGLOBIN A1C  03/16/2021  . MAMMOGRAM  08/25/2021  . FOOT EXAM  12/26/2021  . DEXA SCAN  08/25/2022  . COLONOSCOPY (Pts 45-59yrs Insurance coverage will need to be confirmed)  08/13/2023  . TETANUS/TDAP  12/05/2028  . INFLUENZA VACCINE  Completed  . PNA vac Low Risk Adult  Completed  . Hepatitis C Screening  Addressed     Assessment  1. Annual physical exam   2. Controlled type 2 diabetes mellitus without complication, without long-term current use of insulin ([place])   3. Coronary artery disease involving native coronary artery of native heart without angina pectoris   4. Hypertension associated with diabetes ([place])   5. Hyperlipidemia associated with type 2 diabetes mellitus ([place])   6. Simple chronic bronchitis ([place])   7. Depression, major, recurrent, in partial remission ([place])   8. Smoker   9. Chronic midline low back pain without sciatica      Plan  Female Wellness Visit:  Age appropriate Health Maintenance and Prevention measures were discussed with patient. Included topics are cancer screening recommendations, ways to keep healthy (see AVS) including dietary and exercise recommendations, regular eye and dental care, use of seat belts, and avoidance of moderate alcohol use and tobacco use.  Screens are up-to-date  BMI: discussed patient's BMI and encouraged positive lifestyle modifications to help get  to or maintain a target BMI.  HM needs and immunizations were addressed and ordered. See below for orders. See HM and immunization section for updates.  Recommend booster vaccine for Covid  Routine labs and screening tests ordered including cmp, cbc and lipids where appropriate.  Discussed recommendations regarding Vit D and calcium supplementation (see AVS)  Chronic disease management visit and/or acute problem visit:  Diabetes: Check A1c.  Continue current medications.  Hyperlipidemia and hypertension: Check lipids on increased dose of Crestor.  Adjust up dose of pain she can tolerate it.  And if indicated.  Hypertension is uncontrolled currently.  Increase clonidine to 3 times daily dosing and monitor.  CAD: No angina.  Follow-up with cards.  Chronic back pain: Trial of Mobic daily.  Smoker: Smoking cessation discussed further.  Continue to encourage cessation.  Depression is improved.  Continue high-dose Effexor.   Follow up: 3 months to recheck diabetes and blood pressure No orders of the defined types were placed in this encounter.  No orders of the defined types were placed in this encounter.     Body mass index is 31.11 kg/m. Wt Readings from Last 3 Encounters:  12/26/20 198 lb 9.6 oz (90.1 kg)  09/15/20 194 lb 12.8 oz (88.4 kg)  05/11/20 201 lb 3.2 oz (91.3 kg)     Patient Active Problem List   Diagnosis Date Noted  . History of heart artery stent 02/10/2020    Priority: High  . Simple chronic bronchitis ([place]) 02/11/2018    Priority: High    SPIRO NML 01/2018    . Hyperlipidemia associated with type 2 diabetes mellitus ([place]) 06/04/2017    Priority: High    Atorvastatin 20mg  MWF   . Diabetes mellitus type II, controlled ([place]) 09/10/2016    Priority: High    Metformin 1 g BID, amaryl 2 mg (05/2018 add)   . CAD (coronary artery disease), native coronary artery 09/10/2016    Priority: High    [place].  Statin.  Dr. Marlou Porch now- previouslyNo cardiology since 2012  due to insurance issues.  Dr. Marijo File stent with [facility] in [place]. Cobalt chromium stent 06/05/2011- toPCI/bare metal to mid circumflex. Has not seen him since.    . Hypertension associated with diabetes ([place]) 09/10/2016    Priority: High    lisinopril-hctz 20-25mg , clonidine 0.1mg  tablet BID    . Smoker 09/10/2016    Priority: High    lung cancer screening- 3/4 PPD- since age 31 prior 2 PPD. 1 PPD. Cut down since heart attack.    . Depression, major, recurrent, in partial remission ([place])     Priority: High    reasonable control on venlafaxine 75mg  TID. xanax #14 lasts about 2 years for anxiety portion.    . Chronic midline low back pain without sciatica 12/26/2020    Priority: Medium  . Serrated polyp of colon 08/12/2018    Priority: Medium    Sessile serrated polyp 2019   . Osteopenia 06/10/2017    Priority: Medium    Results:  Lumbar spine (L1-L4) Femoral neck (FN)  T-score +1.0 RFN: -1.4 LFN: -1.1   06/10/17 dexa 07/2020 stable osteopenia   . Mild intermittent asthma 08/06/2019    Priority: Low  . Encounter for screening for lung cancer 02/10/2020    Annual by chest CT; h/o benign appearing nodules.     Health Maintenance  Topic Date Due  . COVID-19 Vaccine (3 - Booster for Pfizer series) 07/04/2020  . OPHTHALMOLOGY EXAM  03/04/2021  . HEMOGLOBIN A1C  03/16/2021  . MAMMOGRAM  08/25/2021  . FOOT EXAM  12/26/2021  . DEXA SCAN  08/25/2022  . COLONOSCOPY (Pts 45-39yrs Insurance coverage will need to be confirmed)  08/13/2023  . TETANUS/TDAP  12/05/2028  . INFLUENZA VACCINE  Completed  . PNA vac Low Risk Adult  Completed  . Hepatitis C Screening  Addressed   Immunization History  Administered Date(s) Administered  . Fluad Quad(high Dose 65+) 08/05/2019, 08/18/2020  . Influenza, High Dose Seasonal PF 08/20/2017, 08/06/2018  . Influenza-Unspecified 09/24/2016  . PFIZER(Purple Top)SARS-COV-2 Vaccination 12/15/2019, 01/05/2020  . Pneumococcal Conjugate-13  09/10/2016  . Pneumococcal Polysaccharide-23 12/02/2017  . Tdap 08/20/2018, 12/05/2018  . Zoster Recombinat (Shingrix) 06/05/2017, 09/18/2017   We updated and reviewed the patient's past history  in detail and it is documented below. Allergies: Patient has No Known Allergies. Past Medical History Patient  has a past medical history of Allergy, Asthma, Chicken pox, Depression, Diabetes mellitus, Genital warts, History of heart artery stent (02/10/2020), Hyperlipidemia, and Hypertension. Past Surgical History Patient  has a past surgical history that includes Tonsillectomy (1968); Coronary angioplasty with stent; and Dilation and curettage of uterus. Family History: Patient family history includes AAA (abdominal aortic aneurysm) in her mother; Arthritis in her brother and mother; Dementia in her maternal grandmother; Heart attack in her brother; Hyperlipidemia in her brother; Hypertension in her brother; Hypothyroidism in her mother; Kidney disease in her mother; Other in her father; Ovarian cancer in her mother; Stroke in her brother. Social History:  Patient  reports that she has been smoking cigarettes. She has a 14.40 pack-year smoking history. She has never used smokeless tobacco. She reports current alcohol use of about 4.0 standard drinks of alcohol per week. She reports that she does not use drugs.  Review of Systems: Constitutional: negative for fever or malaise Ophthalmic: negative for photophobia, double vision or loss of vision Cardiovascular: negative for chest pain, dyspnea on exertion, or new LE swelling Respiratory: negative for SOB or persistent cough Gastrointestinal: negative for abdominal pain, change in bowel habits or melena Genitourinary: negative for dysuria or gross hematuria, no abnormal uterine bleeding or disharge Musculoskeletal: negative for new gait disturbance or muscular weakness Integumentary: negative for new or persistent rashes, no breast lumps Neurological:  negative for TIA or stroke symptoms Psychiatric: negative for SI or delusions Allergic/Immunologic: negative for hives  Patient Care Team    Relationship Specialty Notifications Start End  Leamon Arnt, MD PCP - General Family Medicine  05/11/20   Jerline Pain, MD PCP - Cardiology Cardiology Admissions 03/27/18   Madelin Rear, [facility]  Pharmacist  11/11/20   Madelin Rear, [facility] Pharmacist Pharmacist  11/11/20     Objective  Vitals: BP (!) 160/82   Pulse 67   Temp (!) 97.2 F (36.2 C) (Temporal)   Resp 16   Ht 5\' 7"  (1.702 m)   Wt 198 lb 9.6 oz (90.1 kg)   LMP  (LMP Unknown)   SpO2 96%   BMI 31.11 kg/m  General:  Well developed, well nourished, no acute distress  Psych:  Alert and orientedx3,normal mood and affect HEENT:  Normocephalic, atraumatic, non-icteric sclera,  supple neck without adenopathy, mass or thyromegaly Cardiovascular:  Normal S1, S2, RRR without gallop, rub or murmur Respiratory:  Good breath sounds bilaterally, no wheezing, bronchial breath sounds present bilaterally Gastrointestinal: normal bowel sounds, soft, non-tender, no noted masses. No HSM MSK: no deformities, contusions. Joints are without erythema or swelling.  Skin:  Warm, no rashes or suspicious lesions noted Neurologic:    Mental status is normal. CN 2-11 are normal. Gross motor and sensory exams are normal. Normal gait. No tremor Breast Exam: No mass, skin retraction or nipple discharge is appreciated in either breast. No axillary adenopathy. Fibrocystic changes are not noted   Commons side effects, risks, benefits, and alternatives for medications and treatment plan prescribed today were discussed, and the patient expressed understanding of the given instructions. Patient is instructed to call or message via MyChart if he/she has any questions or concerns regarding our treatment plan. No barriers to understanding were identified. We discussed Red Flag symptoms and signs in detail. Patient expressed  understanding regarding what to do in case of urgent or emergency type symptoms.   Medication list was  reconciled, printed and provided to the patient in AVS. Patient instructions and summary information was reviewed with the patient as documented in the AVS. This note was prepared with assistance of Dragon voice recognition software. Occasional wrong-word or sound-a-like substitutions may have occurred due to the inherent limitations of voice recognition software  This visit occurred during the SARS-CoV-2 public health emergency.  Safety protocols were in place, including screening questions prior to the visit, additional usage of staff PPE, and extensive cleaning of exam room while observing appropriate contact time as indicated for disinfecting solutions.

## 2020-12-27 LAB — LIPID PANEL
Cholesterol: 158 mg/dL (ref 0–200)
HDL: 43.1 mg/dL (ref >39.00)
NonHDL: 115.2
Total CHOL/HDL Ratio: 4
Triglycerides: 226 mg/dL — ABNORMAL HIGH (ref 0.0–149.0)
VLDL: 45.2 mg/dL — ABNORMAL HIGH (ref 0.0–40.0)

## 2020-12-27 LAB — COMPREHENSIVE METABOLIC PANEL
ALT: 13 U/L (ref 0–35)
AST: 18 U/L (ref 0–37)
Albumin: 4.2 g/dL (ref 3.5–5.2)
Alkaline Phosphatase: 118 U/L — ABNORMAL HIGH (ref 39–117)
BUN: 21 mg/dL (ref 6–23)
CO2: 28 mEq/L (ref 19–32)
Calcium: 10.7 mg/dL — ABNORMAL HIGH (ref 8.4–10.5)
Chloride: 96 mEq/L (ref 96–112)
Creatinine, Ser: 1.07 mg/dL (ref 0.40–1.20)
GFR: 53.01 mL/min — ABNORMAL LOW (ref >60.00)
Glucose, Bld: 104 mg/dL — ABNORMAL HIGH (ref 70–99)
Potassium: 4.1 mEq/L (ref 3.5–5.1)
Sodium: 130 mEq/L — ABNORMAL LOW (ref 135–145)
Total Bilirubin: 0.3 mg/dL (ref 0.2–1.2)
Total Protein: 7.2 g/dL (ref 6.0–8.3)

## 2020-12-27 LAB — HEMOGLOBIN A1C: Hgb A1c MFr Bld: 6.9 % — ABNORMAL HIGH (ref 4.6–6.5)

## 2020-12-27 LAB — LDL CHOLESTEROL, DIRECT: Direct LDL: 96 mg/dL

## 2021-01-02 ENCOUNTER — Encounter: Payer: Self-pay | Admitting: Family Medicine

## 2021-01-02 DIAGNOSIS — E871 Hypo-osmolality and hyponatremia: Secondary | ICD-10-CM | POA: Insufficient documentation

## 2021-01-02 NOTE — Progress Notes (Signed)
Please call patient: I have reviewed his/her lab results. First, cholesterol remains above goal: I recommend switching to crestor 20mg  po qhs; stop lipitor. Will increase dose if tolerates its; typically it is better tolerated than lipitor. Please order #[id number] w/ 3rf.  Calcium remains elevated: need to check a few more labs: at next visit will check calcium and pth.  Avoid otc supplements.  Diabetes is ok but a lttle worse. Eat well  Other labs are ok.

## 2021-01-04 ENCOUNTER — Telehealth: Payer: Self-pay

## 2021-01-04 NOTE — Telephone Encounter (Signed)
Pt called following up on lab results. Please advise.

## 2021-01-05 ENCOUNTER — Other Ambulatory Visit: Payer: Self-pay

## 2021-01-05 MED ORDER — ROSUVASTATIN CALCIUM 20 MG PO TABS: 20.0000 mg | ORAL_TABLET | Freq: Every day | ORAL | 3 refills | Status: DC

## 2021-01-09 NOTE — Telephone Encounter (Signed)
Lvm to call back to discuss lab results

## 2021-02-01 ENCOUNTER — Encounter: Payer: Self-pay | Admitting: Family Medicine

## 2021-02-01 ENCOUNTER — Other Ambulatory Visit: Payer: Self-pay

## 2021-02-01 ENCOUNTER — Ambulatory Visit (INDEPENDENT_AMBULATORY_CARE_PROVIDER_SITE_OTHER): Payer: Medicare Other | Admitting: Family Medicine

## 2021-02-01 VITALS — BP 126/78 | HR 71 | Temp 98.1°F | Wt 197.8 lb

## 2021-02-01 DIAGNOSIS — R109 Unspecified abdominal pain: Secondary | ICD-10-CM

## 2021-02-01 DIAGNOSIS — R102 Pelvic and perineal pain: Secondary | ICD-10-CM | POA: Diagnosis not present

## 2021-02-01 DIAGNOSIS — E1169 Type 2 diabetes mellitus with other specified complication: Secondary | ICD-10-CM

## 2021-02-01 DIAGNOSIS — E785 Hyperlipidemia, unspecified: Secondary | ICD-10-CM

## 2021-02-01 DIAGNOSIS — M545 Low back pain, unspecified: Secondary | ICD-10-CM

## 2021-02-01 DIAGNOSIS — G8929 Other chronic pain: Secondary | ICD-10-CM

## 2021-02-01 LAB — POCT URINALYSIS DIPSTICK
Bilirubin, UA: NEGATIVE
Blood, UA: NEGATIVE
Glucose, UA: NEGATIVE
Ketones, UA: NEGATIVE
Leukocytes, UA: NEGATIVE
Nitrite, UA: NEGATIVE
Protein, UA: POSITIVE — AB
Spec Grav, UA: 1.015 (ref 1.010–1.025)
Urobilinogen, UA: 0.2 E.U./dL
pH, UA: 6.5 (ref 5.0–8.0)

## 2021-02-01 MED ORDER — DICLOFENAC SODIUM 75 MG PO TBEC
75.0000 mg | DELAYED_RELEASE_TABLET | Freq: Two times a day (BID) | ORAL | 2 refills | Status: DC | PRN
Start: 2021-02-01 — End: 2021-05-01

## 2021-02-01 MED ORDER — ALPRAZOLAM 0.5 MG PO TABS: 0.5000 mg | ORAL_TABLET | Freq: Every day | ORAL | 0 refills | Status: DC | PRN

## 2021-02-01 NOTE — Progress Notes (Signed)
Subjective  CC:  Chief Complaint  Patient presents with  . Abdominal Pain    Sharp pains for the past week radiating into her lower back. Thinks that recent change from Lipitor to Crestor might be causing this pain   . Medication Refill    Needing Xanax refilled     HPI: Jessica Curry is a 70 y.o. female who presents to the office today to address the problems listed above in the chief complaint.  70 year old with chronic midline lower abdominal pain complains of the same.  Worsening over the last several weeks.  She believes it is related to Crestor.  She has been taking regularly for the last 2 weeks.  Prior she was on Lipitor.  She denies radicular symptoms.  She has responded well to diclofenac in the past.  We change her meloxicam back in January.  Also complaining of lower abdominal pain with a sharp suprapubic pain and pressure with urinary frequency.  No change in color of the urine or blood in the urine but she has noticed an odor.  Complains of intermittent dysuria over the last week.  No fevers or chills but feels that she is always cold.  Temperatures have been normal as.  No nausea or vomiting but she has lost her appetite.  She feels very fatigued.  Chronic anxiety on intermittent Xanax.  Needs refill Assessment  1. Chronic midline low back pain without sciatica   2. Abdominal pain, unspecified abdominal location   3. Suprapubic pain   4. Hypercalcemia   5. Hyperlipidemia associated with type 2 diabetes mellitus ([place])      Plan   Chronic back pain: No red flag symptoms.  Patient thinks it may be related to Crestor.  She can hold Crestor for now.  Change back to diclofenac for pain control.  Suprapubic pain, lack of appetite, mild urinary symptoms: Check urine culture.  No signs or symptoms of pyelonephritis at this time.  Check lab work.  Hypercalcemia: Recheck today with PTH.  Hyperlipidemia due to diabetes: Hold Crestor to see if helps with back pain.  She  reports she was used to Lipitor.  Follow up: Rec follow-up in early May for diabetes recheck 06/13/2021  Orders Placed This Encounter  Procedures  . Urine Culture  . CBC with Differential/Platelet  . COMPLETE METABOLIC PANEL WITH GFR  . PTH, Intact and Calcium  . POCT Urinalysis Dipstick   Meds ordered this encounter  Medications  . diclofenac (VOLTAREN) 75 MG EC tablet    Sig: Take 1 tablet (75 mg total) by mouth 2 (two) times daily as needed.    Dispense:  60 tablet    Refill:  2  . ALPRAZolam (XANAX) 0.5 MG tablet    Sig: Take 1 tablet (0.5 mg total) by mouth daily as needed for anxiety.    Dispense:  30 tablet    Refill:  0      I reviewed the patients updated PMH, FH, and SocHx.    Patient Active Problem List   Diagnosis Date Noted  . History of heart artery stent 02/10/2020    Priority: High  . Simple chronic bronchitis ([place]) 02/11/2018    Priority: High  . Hyperlipidemia associated with type 2 diabetes mellitus ([place]) 06/04/2017    Priority: High  . Diabetes mellitus type II, controlled ([place]) 09/10/2016    Priority: High  . CAD (coronary artery disease), native coronary artery 09/10/2016    Priority: High  . Hypertension associated with  diabetes ([place]) 09/10/2016    Priority: High  . Smoker 09/10/2016    Priority: High  . Depression, major, recurrent, in partial remission ([place])     Priority: High  . Chronic midline low back pain without sciatica 12/26/2020    Priority: Medium  . Serrated polyp of colon 08/12/2018    Priority: Medium  . Osteopenia 06/10/2017    Priority: Medium  . Mild intermittent asthma 08/06/2019    Priority: Low  . Hyponatremia 01/02/2021  . Hypercalcemia 01/02/2021  . Encounter for screening for lung cancer 02/10/2020   Current Meds  Medication Sig  . albuterol (PROAIR HFA) 108 (90 Base) MCG/ACT inhaler Inhale 2 puffs into the lungs every 6 (six) hours as needed for wheezing or shortness of breath.  Marland Kitchen aspirin 81 MG tablet Take 81  mg by mouth daily.  . baclofen (LIORESAL) 10 MG tablet Take 1 tablet (10 mg total) by mouth 3 (three) times daily as needed for muscle spasms.  . cloNIDine (CATAPRES) 0.1 MG tablet Take 1 tablet (0.1 mg total) by mouth 3 (three) times daily.  . diclofenac (VOLTAREN) 75 MG EC tablet Take 1 tablet (75 mg total) by mouth 2 (two) times daily as needed.  . diphenhydrAMINE (BENADRYL) 25 MG tablet Take 25 mg by mouth every 6 (six) hours as needed.  Marland Kitchen lisinopril-hydrochlorothiazide (ZESTORETIC) 20-25 MG tablet TAKE 1 TABLET BY MOUTH  DAILY  . metFORMIN (GLUCOPHAGE) 1000 MG tablet TAKE 1 TABLET BY MOUTH  TWICE DAILY WITH MEALS  . Multiple Vitamins-Minerals (MULTIVITAMIN WITH MINERALS) tablet Take 1 tablet by mouth daily. Centrum silver for women  . rosuvastatin (CRESTOR) 20 MG tablet Take 1 tablet (20 mg total) by mouth daily.  Marland Kitchen venlafaxine XR (EFFEXOR-XR) 75 MG 24 hr capsule TAKE THREE CAPSULES BY MOUTH DAILY WITH BREAKFAST  . [DISCONTINUED] ALPRAZolam (XANAX) 0.5 MG tablet Take 1 tablet (0.5 mg total) by mouth daily as needed for anxiety.  . [DISCONTINUED] atorvastatin (LIPITOR) 20 MG tablet Take 1 tablet (20 mg total) by mouth at bedtime.  . [DISCONTINUED] meloxicam (MOBIC) 15 MG tablet Take 1 tablet (15 mg total) by mouth daily.    Allergies: Patient has No Known Allergies. Family History: Patient family history includes AAA (abdominal aortic aneurysm) in her mother; Arthritis in her brother and mother; Dementia in her maternal grandmother; Heart attack in her brother; Hyperlipidemia in her brother; Hypertension in her brother; Hypothyroidism in her mother; Kidney disease in her mother; Other in her father; Ovarian cancer in her mother; Stroke in her brother. Social History:  Patient  reports that she has been smoking cigarettes. She has a 14.40 pack-year smoking history. She has never used smokeless tobacco. She reports current alcohol use of about 4.0 standard drinks of alcohol per week. She  reports that she does not use drugs.  Review of Systems: Constitutional: Negative for fever malaise or anorexia Cardiovascular: negative for chest pain Respiratory: negative for SOB or persistent cough Gastrointestinal: negative for abdominal pain  Objective  Vitals: BP 126/78   Pulse 71   Temp 98.1 F (36.7 C) (Temporal)   Wt 197 lb 12.8 oz (89.7 kg)   LMP  (LMP Unknown)   SpO2 97%   BMI 30.98 kg/m  General: no acute distress , A&Ox3, nontoxic HEENT: PEERL, conjunctiva normal, neck is supple Cardiovascular:  RRR without murmur or gallop.  Respiratory:  Good breath sounds bilaterally, CTAB with normal respiratory effort Abdomen: Soft, diffuse mild tenderness without rebound, guarding or masses.  Worse over suprapubic  area.  No CVA tenderness. Back: Full range of motion, nontender, normal gait, negative straight leg raise bilaterally Skin:  Warm, no rashes  Office Visit on 02/01/2021  Component Date Value Ref Range Status  . Color, UA 02/01/2021 yellow   Final  . Clarity, UA 02/01/2021 hazy   Final  . Glucose, UA 02/01/2021 Negative  Negative Final  . Bilirubin, UA 02/01/2021 negative   Final  . Ketones, UA 02/01/2021 negative   Final  . Spec Grav, UA 02/01/2021 1.015  1.010 - 1.025 Final  . Blood, UA 02/01/2021 negative   Final  . pH, UA 02/01/2021 6.5  5.0 - 8.0 Final  . Protein, UA 02/01/2021 Positive* Negative Final  . Urobilinogen, UA 02/01/2021 0.2  0.2 or 1.0 E.U./dL Final  . Nitrite, UA 02/01/2021 negative   Final  . Leukocytes, UA 02/01/2021 Negative  Negative Final      Commons side effects, risks, benefits, and alternatives for medications and treatment plan prescribed today were discussed, and the patient expressed understanding of the given instructions. Patient is instructed to call or message via MyChart if he/she has any questions or concerns regarding our treatment plan. No barriers to understanding were identified. We discussed Red Flag symptoms and signs  in detail. Patient expressed understanding regarding what to do in case of urgent or emergency type symptoms.   Medication list was reconciled, printed and provided to the patient in AVS. Patient instructions and summary information was reviewed with the patient as documented in the AVS. This note was prepared with assistance of Dragon voice recognition software. Occasional wrong-word or sound-a-like substitutions may have occurred due to the inherent limitations of voice recognition software  This visit occurred during the SARS-CoV-2 public health emergency.  Safety protocols were in place, including screening questions prior to the visit, additional usage of staff PPE, and extensive cleaning of exam room while observing appropriate contact time as indicated for disinfecting solutions.

## 2021-02-01 NOTE — Patient Instructions (Addendum)
Please return in May to recheck diabetes, cholesterol and calcium.  Take diclofenac as needed for your low back pain.  Hold the crestor to see if that helps I will let your know how your blood work looks.  Stop the meloxicam.   If you have any questions or concerns, please don't hesitate to send me a message via MyChart or call the office at [phone]. Thank you for visiting with [place] today! It's our pleasure caring for you.

## 2021-02-02 LAB — CBC WITH DIFFERENTIAL/PLATELET
Basophils Absolute: 0.1 10*3/uL (ref 0.0–0.1)
Basophils Relative: 0.8 % (ref 0.0–3.0)
Eosinophils Absolute: 0.1 10*3/uL (ref 0.0–0.7)
Eosinophils Relative: 1.2 % (ref 0.0–5.0)
HCT: 38.1 % (ref 36.0–46.0)
Hemoglobin: 13.2 g/dL (ref 12.0–15.0)
Lymphocytes Relative: 9.3 % — ABNORMAL LOW (ref 12.0–46.0)
Lymphs Abs: 0.8 10*3/uL (ref 0.7–4.0)
MCHC: 34.6 g/dL (ref 30.0–36.0)
MCV: 87.3 fl (ref 78.0–100.0)
Monocytes Absolute: 0.7 10*3/uL (ref 0.1–1.0)
Monocytes Relative: 7.3 % (ref 3.0–12.0)
Neutro Abs: 7.4 10*3/uL (ref 1.4–7.7)
Neutrophils Relative %: 81.4 % — ABNORMAL HIGH (ref 43.0–77.0)
Platelets: 283 10*3/uL (ref 150.0–400.0)
RBC: 4.36 Mil/uL (ref 3.87–5.11)
RDW: 15.1 % (ref 11.5–15.5)
WBC: 9.1 10*3/uL (ref 4.0–10.5)

## 2021-02-03 ENCOUNTER — Ambulatory Visit (INDEPENDENT_AMBULATORY_CARE_PROVIDER_SITE_OTHER): Payer: Medicare Other | Admitting: Family Medicine

## 2021-02-03 ENCOUNTER — Other Ambulatory Visit: Payer: Self-pay

## 2021-02-03 ENCOUNTER — Encounter: Payer: Self-pay | Admitting: Family Medicine

## 2021-02-03 VITALS — BP 122/78 | HR 74 | Temp 97.2°F | Wt 199.2 lb

## 2021-02-03 DIAGNOSIS — R350 Frequency of micturition: Secondary | ICD-10-CM | POA: Diagnosis not present

## 2021-02-03 DIAGNOSIS — R102 Pelvic and perineal pain: Secondary | ICD-10-CM | POA: Diagnosis not present

## 2021-02-03 LAB — POCT URINALYSIS DIPSTICK
Bilirubin, UA: NEGATIVE
Blood, UA: NEGATIVE
Glucose, UA: NEGATIVE
Ketones, UA: NEGATIVE
Leukocytes, UA: NEGATIVE
Nitrite, UA: NEGATIVE
Protein, UA: POSITIVE — AB
Spec Grav, UA: 1.015 (ref 1.010–1.025)
Urobilinogen, UA: 0.2 E.U./dL
pH, UA: 6 (ref 5.0–8.0)

## 2021-02-03 LAB — CBC WITH DIFFERENTIAL/PLATELET
Basophils Absolute: 0 10*3/uL (ref 0.0–0.1)
Basophils Relative: 0.5 % (ref 0.0–3.0)
Eosinophils Absolute: 0.1 10*3/uL (ref 0.0–0.7)
Eosinophils Relative: 1 % (ref 0.0–5.0)
HCT: 35 % — ABNORMAL LOW (ref 36.0–46.0)
Hemoglobin: 12.3 g/dL (ref 12.0–15.0)
Lymphocytes Relative: 8 % — ABNORMAL LOW (ref 12.0–46.0)
Lymphs Abs: 0.6 10*3/uL — ABNORMAL LOW (ref 0.7–4.0)
MCHC: 35 g/dL (ref 30.0–36.0)
MCV: 86.5 fl (ref 78.0–100.0)
Monocytes Absolute: 0.6 10*3/uL (ref 0.1–1.0)
Monocytes Relative: 7.3 % (ref 3.0–12.0)
Neutro Abs: 6.5 10*3/uL (ref 1.4–7.7)
Neutrophils Relative %: 83.2 % — ABNORMAL HIGH (ref 43.0–77.0)
Platelets: 256 10*3/uL (ref 150.0–400.0)
RBC: 4.05 Mil/uL (ref 3.87–5.11)
RDW: 15 % (ref 11.5–15.5)
WBC: 7.8 10*3/uL (ref 4.0–10.5)

## 2021-02-03 LAB — URINE CULTURE
MICRO NUMBER:: 11626915
SPECIMEN QUALITY:: ADEQUATE

## 2021-02-03 LAB — COMPLETE METABOLIC PANEL WITH GFR
AG Ratio: 1.6 (calc) (ref 1.0–2.5)
ALT: 13 U/L (ref 6–29)
AST: 16 U/L (ref 10–35)
Albumin: 4.1 g/dL (ref 3.6–5.1)
Alkaline phosphatase (APISO): 104 U/L (ref 37–153)
BUN/Creatinine Ratio: 13 (calc) (ref 6–22)
BUN: 15 mg/dL (ref 7–25)
CO2: 23 mmol/L (ref 20–32)
Calcium: 10.5 mg/dL — ABNORMAL HIGH (ref 8.6–10.4)
Chloride: 96 mmol/L — ABNORMAL LOW (ref 98–110)
Creat: 1.15 mg/dL — ABNORMAL HIGH (ref 0.50–0.99)
GFR, Est African American: 56 mL/min/{1.73_m2} — ABNORMAL LOW (ref >=60)
GFR, Est Non African American: 49 mL/min/{1.73_m2} — ABNORMAL LOW (ref >=60)
Globulin: 2.5 g/dL (calc) (ref 1.9–3.7)
Glucose, Bld: 146 mg/dL — ABNORMAL HIGH (ref 65–99)
Potassium: 4.2 mmol/L (ref 3.5–5.3)
Sodium: 131 mmol/L — ABNORMAL LOW (ref 135–146)
Total Bilirubin: 0.7 mg/dL (ref 0.2–1.2)
Total Protein: 6.6 g/dL (ref 6.1–8.1)

## 2021-02-03 LAB — BASIC METABOLIC PANEL
BUN: 20 mg/dL (ref 6–23)
CO2: 26 mEq/L (ref 19–32)
Calcium: 9.8 mg/dL (ref 8.4–10.5)
Chloride: 91 mEq/L — ABNORMAL LOW (ref 96–112)
Creatinine, Ser: 1.27 mg/dL — ABNORMAL HIGH (ref 0.40–1.20)
GFR: 43.13 mL/min — ABNORMAL LOW (ref >60.00)
Glucose, Bld: 144 mg/dL — ABNORMAL HIGH (ref 70–99)
Potassium: 4.1 mEq/L (ref 3.5–5.1)
Sodium: 126 mEq/L — ABNORMAL LOW (ref 135–145)

## 2021-02-03 LAB — PTH, INTACT AND CALCIUM
Calcium: 10.5 mg/dL — ABNORMAL HIGH (ref 8.6–10.4)
PTH: 57 pg/mL (ref 14–64)

## 2021-02-03 MED ORDER — SULFAMETHOXAZOLE-TRIMETHOPRIM 800-160 MG PO TABS: 1.0000 | ORAL_TABLET | Freq: Two times a day (BID) | ORAL | 0 refills | Status: DC

## 2021-02-03 MED ORDER — NITROFURANTOIN MONOHYD MACRO 100 MG PO CAPS: 100.0000 mg | ORAL_CAPSULE | Freq: Two times a day (BID) | ORAL | 0 refills | Status: DC

## 2021-02-03 NOTE — Patient Instructions (Signed)
Please follow up if symptoms do not improve or as needed.   Start the antibiotic for now.  Eat and drink today to stay hydrated.  Monitor your temperatures.

## 2021-02-03 NOTE — Progress Notes (Signed)
Subjective  CC:  Chief Complaint  Patient presents with  . Fever    99.9 oral temp at home  . Chills       . Urinary Frequency  . Abdominal Pain    HPI: Jessica Curry is a 70 y.o. female who presents to the office today to address the problems listed above in the chief complaint.  Reports low grade fevers and worsening urinary sxs: mainly suprapubic pressure and urinary frequency. No n/v/d or flank pain. Has back pain. No uri sxs. See last note. She has been very tired and not eating or drinking much. Denies myalgias. No sob.   Office Visit on 02/03/2021  Component Date Value Ref Range Status  . Color, UA 02/03/2021 yellow   Final  . Clarity, UA 02/03/2021 hazy   Final  . Glucose, UA 02/03/2021 Negative  Negative Final  . Bilirubin, UA 02/03/2021 neg   Final  . Ketones, UA 02/03/2021 neg   Final  . Spec Grav, UA 02/03/2021 1.015  1.010 - 1.025 Final  . Blood, UA 02/03/2021 neg   Final  . pH, UA 02/03/2021 6.0  5.0 - 8.0 Final  . Protein, UA 02/03/2021 Positive* Negative Final  . Urobilinogen, UA 02/03/2021 0.2  0.2 or 1.0 E.U./dL Final  . Nitrite, UA 02/03/2021 neg   Final  . Leukocytes, UA 02/03/2021 Negative  Negative Final  . Sodium 02/03/2021 126* 135 - 145 mEq/L Final  . Potassium 02/03/2021 4.1  3.5 - 5.1 mEq/L Final  . Chloride 02/03/2021 91* 96 - 112 mEq/L Final  . CO2 02/03/2021 26  19 - 32 mEq/L Final  . Glucose, Bld 02/03/2021 144* 70 - 99 mg/dL Final  . BUN 02/03/2021 20  6 - 23 mg/dL Final  . Creatinine, Ser 02/03/2021 1.27* 0.40 - 1.20 mg/dL Final  . GFR 02/03/2021 43.13* >60.00 mL/min Final  . Calcium 02/03/2021 9.8  8.4 - 10.5 mg/dL Final  . WBC 02/03/2021 7.8  4.0 - 10.5 K/uL Final  . RBC 02/03/2021 4.05  3.87 - 5.11 Mil/uL Final  . Hemoglobin 02/03/2021 12.3  12.0 - 15.0 g/dL Final  . HCT 02/03/2021 35.0* 36.0 - 46.0 % Final  . MCV 02/03/2021 86.5  78.0 - 100.0 fl Final  . MCHC 02/03/2021 35.0  30.0 - 36.0 g/dL Final  . RDW 02/03/2021 15.0  11.5 -  15.5 % Final  . Platelets 02/03/2021 256.0  150.0 - 400.0 K/uL Final  . Neutrophils Relative % 02/03/2021 83.2* 43.0 - 77.0 % Final  . Lymphocytes Relative 02/03/2021 8.0* 12.0 - 46.0 % Final  . Monocytes Relative 02/03/2021 7.3  3.0 - 12.0 % Final  . Eosinophils Relative 02/03/2021 1.0  0.0 - 5.0 % Final  . Basophils Relative 02/03/2021 0.5  0.0 - 3.0 % Final  . Neutro Abs 02/03/2021 6.5  1.4 - 7.7 K/uL Final  . Lymphs Abs 02/03/2021 0.6* 0.7 - 4.0 K/uL Final  . Monocytes Absolute 02/03/2021 0.6  0.1 - 1.0 K/uL Final  . Eosinophils Absolute 02/03/2021 0.1  0.0 - 0.7 K/uL Final  . Basophils Absolute 02/03/2021 0.0  0.0 - 0.1 K/uL Final     Assessment  1. Urine frequency   2. Suprapubic pain      Plan   Possible UTI with dehydration: push fluids. Start abx and f/u on labs nontoxic appearing.   Follow up: prn  06/13/2021  Orders Placed This Encounter  Procedures  . Urine Culture  . Basic metabolic panel  . CBC  with Differential/Platelet  . POCT Urinalysis Dipstick   Meds ordered this encounter  Medications  . sulfamethoxazole-trimethoprim (BACTRIM DS) 800-160 MG tablet    Sig: Take 1 tablet by mouth 2 (two) times daily.    Dispense:  14 tablet    Refill:  0      I reviewed the patients updated PMH, FH, and SocHx.    Patient Active Problem List   Diagnosis Date Noted  . History of heart artery stent 02/10/2020    Priority: High  . Simple chronic bronchitis ([place]) 02/11/2018    Priority: High  . Hyperlipidemia associated with type 2 diabetes mellitus ([place]) 06/04/2017    Priority: High  . Diabetes mellitus type II, controlled ([place]) 09/10/2016    Priority: High  . CAD (coronary artery disease), native coronary artery 09/10/2016    Priority: High  . Hypertension associated with diabetes ([place]) 09/10/2016    Priority: High  . Smoker 09/10/2016    Priority: High  . Depression, major, recurrent, in partial remission ([place])     Priority: High  . Chronic midline low back  pain without sciatica 12/26/2020    Priority: Medium  . Serrated polyp of colon 08/12/2018    Priority: Medium  . Osteopenia 06/10/2017    Priority: Medium  . Mild intermittent asthma 08/06/2019    Priority: Low  . Hyponatremia 01/02/2021  . Hypercalcemia 01/02/2021  . Encounter for screening for lung cancer 02/10/2020   Current Meds  Medication Sig  . albuterol (PROAIR HFA) 108 (90 Base) MCG/ACT inhaler Inhale 2 puffs into the lungs every 6 (six) hours as needed for wheezing or shortness of breath.  . ALPRAZolam (XANAX) 0.5 MG tablet Take 1 tablet (0.5 mg total) by mouth daily as needed for anxiety.  Marland Kitchen aspirin 81 MG tablet Take 81 mg by mouth daily.  . baclofen (LIORESAL) 10 MG tablet Take 1 tablet (10 mg total) by mouth 3 (three) times daily as needed for muscle spasms.  . cloNIDine (CATAPRES) 0.1 MG tablet Take 1 tablet (0.1 mg total) by mouth 3 (three) times daily.  . diclofenac (VOLTAREN) 75 MG EC tablet Take 1 tablet (75 mg total) by mouth 2 (two) times daily as needed.  . diphenhydrAMINE (BENADRYL) 25 MG tablet Take 25 mg by mouth every 6 (six) hours as needed.  Marland Kitchen lisinopril-hydrochlorothiazide (ZESTORETIC) 20-25 MG tablet TAKE 1 TABLET BY MOUTH  DAILY  . metFORMIN (GLUCOPHAGE) 1000 MG tablet TAKE 1 TABLET BY MOUTH  TWICE DAILY WITH MEALS  . Multiple Vitamins-Minerals (MULTIVITAMIN WITH MINERALS) tablet Take 1 tablet by mouth daily. Centrum silver for women  . rosuvastatin (CRESTOR) 20 MG tablet Take 1 tablet (20 mg total) by mouth daily.  Marland Kitchen sulfamethoxazole-trimethoprim (BACTRIM DS) 800-160 MG tablet Take 1 tablet by mouth 2 (two) times daily.  Marland Kitchen venlafaxine XR (EFFEXOR-XR) 75 MG 24 hr capsule TAKE THREE CAPSULES BY MOUTH DAILY WITH BREAKFAST    Allergies: Patient has No Known Allergies. Family History: Patient family history includes AAA (abdominal aortic aneurysm) in her mother; Arthritis in her brother and mother; Dementia in her maternal grandmother; Heart attack in her  brother; Hyperlipidemia in her brother; Hypertension in her brother; Hypothyroidism in her mother; Kidney disease in her mother; Other in her father; Ovarian cancer in her mother; Stroke in her brother. Social History:  Patient  reports that she has been smoking cigarettes. She has a 14.40 pack-year smoking history. She has never used smokeless tobacco. She reports current alcohol use of about 4.0 standard  drinks of alcohol per week. She reports that she does not use drugs.  Review of Systems: Constitutional: Negative for fever malaise or anorexia Cardiovascular: negative for chest pain Respiratory: negative for SOB or persistent cough Gastrointestinal: negative for abdominal pain  Objective  Vitals: BP 122/78   Pulse 74   Temp (!) 97.2 F (36.2 C) (Temporal)   Wt 199 lb 3.2 oz (90.4 kg)   LMP  (LMP Unknown)   SpO2 97%   BMI 31.20 kg/m  General: no acute distress , A&Ox3 HEENT: PEERL, conjunctiva normal, neck is supple, fry mm Cardiovascular:  RRR without murmur or gallop.  Respiratory:  Good breath sounds bilaterally, CTAB with normal respiratory effort, no CVAT Abd: benign with suprapubic ttp. No rebound or guarding or masses Skin:  Warm, no rashes  Office Visit on 02/03/2021  Component Date Value Ref Range Status  . Color, UA 02/03/2021 yellow   Final  . Clarity, UA 02/03/2021 hazy   Final  . Glucose, UA 02/03/2021 Negative  Negative Final  . Bilirubin, UA 02/03/2021 neg   Final  . Ketones, UA 02/03/2021 neg   Final  . Spec Grav, UA 02/03/2021 1.015  1.010 - 1.025 Final  . Blood, UA 02/03/2021 neg   Final  . pH, UA 02/03/2021 6.0  5.0 - 8.0 Final  . Protein, UA 02/03/2021 Positive* Negative Final  . Urobilinogen, UA 02/03/2021 0.2  0.2 or 1.0 E.U./dL Final  . Nitrite, UA 02/03/2021 neg   Final  . Leukocytes, UA 02/03/2021 Negative  Negative Final  . Sodium 02/03/2021 126* 135 - 145 mEq/L Final  . Potassium 02/03/2021 4.1  3.5 - 5.1 mEq/L Final  . Chloride 02/03/2021 91*  96 - 112 mEq/L Final  . CO2 02/03/2021 26  19 - 32 mEq/L Final  . Glucose, Bld 02/03/2021 144* 70 - 99 mg/dL Final  . BUN 02/03/2021 20  6 - 23 mg/dL Final  . Creatinine, Ser 02/03/2021 1.27* 0.40 - 1.20 mg/dL Final  . GFR 02/03/2021 43.13* >60.00 mL/min Final  . Calcium 02/03/2021 9.8  8.4 - 10.5 mg/dL Final  . WBC 02/03/2021 7.8  4.0 - 10.5 K/uL Final  . RBC 02/03/2021 4.05  3.87 - 5.11 Mil/uL Final  . Hemoglobin 02/03/2021 12.3  12.0 - 15.0 g/dL Final  . HCT 02/03/2021 35.0* 36.0 - 46.0 % Final  . MCV 02/03/2021 86.5  78.0 - 100.0 fl Final  . MCHC 02/03/2021 35.0  30.0 - 36.0 g/dL Final  . RDW 02/03/2021 15.0  11.5 - 15.5 % Final  . Platelets 02/03/2021 256.0  150.0 - 400.0 K/uL Final  . Neutrophils Relative % 02/03/2021 83.2* 43.0 - 77.0 % Final  . Lymphocytes Relative 02/03/2021 8.0* 12.0 - 46.0 % Final  . Monocytes Relative 02/03/2021 7.3  3.0 - 12.0 % Final  . Eosinophils Relative 02/03/2021 1.0  0.0 - 5.0 % Final  . Basophils Relative 02/03/2021 0.5  0.0 - 3.0 % Final  . Neutro Abs 02/03/2021 6.5  1.4 - 7.7 K/uL Final  . Lymphs Abs 02/03/2021 0.6* 0.7 - 4.0 K/uL Final  . Monocytes Absolute 02/03/2021 0.6  0.1 - 1.0 K/uL Final  . Eosinophils Absolute 02/03/2021 0.1  0.0 - 0.7 K/uL Final  . Basophils Absolute 02/03/2021 0.0  0.0 - 0.1 K/uL Final   Lab Results  Component Value Date   CREATININE 1.27 (H) 02/03/2021   BUN 20 02/03/2021   NA 126 (L) 02/03/2021   K 4.1 02/03/2021   CL 91 (L) 02/03/2021  CO2 26 02/03/2021   Lab Results  Component Value Date   WBC 7.8 02/03/2021   HGB 12.3 02/03/2021   HCT 35.0 (L) 02/03/2021   MCV 86.5 02/03/2021   PLT 256.0 02/03/2021   Lab Results  Component Value Date   ALT 13 02/01/2021   AST 16 02/01/2021   ALKPHOS 118 (H) 12/26/2020   BILITOT 0.7 02/01/2021      Commons side effects, risks, benefits, and alternatives for medications and treatment plan prescribed today were discussed, and the patient expressed understanding  of the given instructions. Patient is instructed to call or message via MyChart if he/she has any questions or concerns regarding our treatment plan. No barriers to understanding were identified. We discussed Red Flag symptoms and signs in detail. Patient expressed understanding regarding what to do in case of urgent or emergency type symptoms.   Medication list was reconciled, printed and provided to the patient in AVS. Patient instructions and summary information was reviewed with the patient as documented in the AVS. This note was prepared with assistance of Dragon voice recognition software. Occasional wrong-word or sound-a-like substitutions may have occurred due to the inherent limitations of voice recognition software  This visit occurred during the SARS-CoV-2 public health emergency.  Safety protocols were in place, including screening questions prior to the visit, additional usage of staff PPE, and extensive cleaning of exam room while observing appropriate contact time as indicated for disinfecting solutions.

## 2021-02-03 NOTE — Progress Notes (Signed)
Please call patient: I have reviewed his/her lab results. Urine shows a possible infection. Please take the macrobid instead of the septra! Thanks.

## 2021-02-03 NOTE — Addendum Note (Signed)
Addended by: Billey Chang on: 02/03/2021 12:28 PM   Modules accepted: Orders

## 2021-02-05 LAB — URINE CULTURE
MICRO NUMBER:: 11637476
SPECIMEN QUALITY:: ADEQUATE

## 2021-02-06 ENCOUNTER — Telehealth: Payer: Self-pay

## 2021-02-06 ENCOUNTER — Other Ambulatory Visit: Payer: Self-pay

## 2021-02-06 ENCOUNTER — Emergency Department (HOSPITAL_COMMUNITY): Payer: Medicare Other

## 2021-02-06 ENCOUNTER — Observation Stay (HOSPITAL_COMMUNITY)
Admission: EM | Admit: 2021-02-06 | Discharge: 2021-02-07 | Disposition: A | Discharge status: Home / Self Care | Payer: Medicare Other | Attending: Internal Medicine | Admitting: Internal Medicine

## 2021-02-06 ENCOUNTER — Encounter (HOSPITAL_COMMUNITY): Payer: Self-pay

## 2021-02-06 DIAGNOSIS — Z7982 Long term (current) use of aspirin: Secondary | ICD-10-CM | POA: Insufficient documentation

## 2021-02-06 DIAGNOSIS — R531 Weakness: Secondary | ICD-10-CM | POA: Diagnosis not present

## 2021-02-06 DIAGNOSIS — E1122 Type 2 diabetes mellitus with diabetic chronic kidney disease: Secondary | ICD-10-CM | POA: Insufficient documentation

## 2021-02-06 DIAGNOSIS — E1159 Type 2 diabetes mellitus with other circulatory complications: Secondary | ICD-10-CM

## 2021-02-06 DIAGNOSIS — Z7289 Other problems related to lifestyle: Secondary | ICD-10-CM

## 2021-02-06 DIAGNOSIS — I2510 Atherosclerotic heart disease of native coronary artery without angina pectoris: Secondary | ICD-10-CM | POA: Insufficient documentation

## 2021-02-06 DIAGNOSIS — Z7984 Long term (current) use of oral hypoglycemic drugs: Secondary | ICD-10-CM | POA: Diagnosis not present

## 2021-02-06 DIAGNOSIS — Z955 Presence of coronary angioplasty implant and graft: Secondary | ICD-10-CM | POA: Diagnosis not present

## 2021-02-06 DIAGNOSIS — I152 Hypertension secondary to endocrine disorders: Secondary | ICD-10-CM | POA: Diagnosis present

## 2021-02-06 DIAGNOSIS — E871 Hypo-osmolality and hyponatremia: Principal | ICD-10-CM

## 2021-02-06 DIAGNOSIS — N179 Acute kidney failure, unspecified: Secondary | ICD-10-CM

## 2021-02-06 DIAGNOSIS — E785 Hyperlipidemia, unspecified: Secondary | ICD-10-CM

## 2021-02-06 DIAGNOSIS — F17210 Nicotine dependence, cigarettes, uncomplicated: Secondary | ICD-10-CM | POA: Insufficient documentation

## 2021-02-06 DIAGNOSIS — N1831 Chronic kidney disease, stage 3a: Secondary | ICD-10-CM | POA: Insufficient documentation

## 2021-02-06 DIAGNOSIS — N3000 Acute cystitis without hematuria: Secondary | ICD-10-CM | POA: Diagnosis not present

## 2021-02-06 DIAGNOSIS — J45909 Unspecified asthma, uncomplicated: Secondary | ICD-10-CM | POA: Insufficient documentation

## 2021-02-06 DIAGNOSIS — E1169 Type 2 diabetes mellitus with other specified complication: Secondary | ICD-10-CM | POA: Diagnosis present

## 2021-02-06 DIAGNOSIS — N281 Cyst of kidney, acquired: Secondary | ICD-10-CM

## 2021-02-06 DIAGNOSIS — Z789 Other specified health status: Secondary | ICD-10-CM

## 2021-02-06 DIAGNOSIS — Z79899 Other long term (current) drug therapy: Secondary | ICD-10-CM | POA: Diagnosis not present

## 2021-02-06 DIAGNOSIS — I129 Hypertensive chronic kidney disease with stage 1 through stage 4 chronic kidney disease, or unspecified chronic kidney disease: Secondary | ICD-10-CM | POA: Insufficient documentation

## 2021-02-06 DIAGNOSIS — N390 Urinary tract infection, site not specified: Secondary | ICD-10-CM | POA: Diagnosis not present

## 2021-02-06 DIAGNOSIS — Z20822 Contact with and (suspected) exposure to covid-19: Secondary | ICD-10-CM | POA: Insufficient documentation

## 2021-02-06 DIAGNOSIS — E1121 Type 2 diabetes mellitus with diabetic nephropathy: Secondary | ICD-10-CM

## 2021-02-06 DIAGNOSIS — E119 Type 2 diabetes mellitus without complications: Secondary | ICD-10-CM

## 2021-02-06 LAB — COMPREHENSIVE METABOLIC PANEL
ALT: 28 U/L (ref 0–44)
AST: 28 U/L (ref 15–41)
Albumin: 3.5 g/dL (ref 3.5–5.0)
Alkaline Phosphatase: 135 U/L — ABNORMAL HIGH (ref 38–126)
Anion gap: 9 (ref 5–15)
BUN: 25 mg/dL — ABNORMAL HIGH (ref 8–23)
CO2: 27 mmol/L (ref 22–32)
Calcium: 9.9 mg/dL (ref 8.9–10.3)
Chloride: 89 mmol/L — ABNORMAL LOW (ref 98–111)
Creatinine, Ser: 1.74 mg/dL — ABNORMAL HIGH (ref 0.44–1.00)
GFR, Estimated: 31 mL/min — ABNORMAL LOW (ref >60)
Glucose, Bld: 147 mg/dL — ABNORMAL HIGH (ref 70–99)
Potassium: 3.8 mmol/L (ref 3.5–5.1)
Sodium: 125 mmol/L — ABNORMAL LOW (ref 135–145)
Total Bilirubin: 0.6 mg/dL (ref 0.3–1.2)
Total Protein: 7.1 g/dL (ref 6.5–8.1)

## 2021-02-06 LAB — RESP PANEL BY RT-PCR (FLU A&B, COVID) ARPGX2
Influenza A by PCR: NEGATIVE
Influenza B by PCR: NEGATIVE
SARS Coronavirus 2 by RT PCR: NEGATIVE

## 2021-02-06 LAB — URINALYSIS, ROUTINE W REFLEX MICROSCOPIC
Bilirubin Urine: NEGATIVE
Glucose, UA: NEGATIVE mg/dL
Hgb urine dipstick: NEGATIVE
Ketones, ur: NEGATIVE mg/dL
Leukocytes,Ua: NEGATIVE
Nitrite: NEGATIVE
Protein, ur: 100 mg/dL — AB
Specific Gravity, Urine: 1.011 (ref 1.005–1.030)
pH: 6 (ref 5.0–8.0)

## 2021-02-06 LAB — CBC
HCT: 33.5 % — ABNORMAL LOW (ref 36.0–46.0)
Hemoglobin: 11.6 g/dL — ABNORMAL LOW (ref 12.0–15.0)
MCH: 29.7 pg (ref 26.0–34.0)
MCHC: 34.6 g/dL (ref 30.0–36.0)
MCV: 85.9 fL (ref 80.0–100.0)
Platelets: 238 10*3/uL (ref 150–400)
RBC: 3.9 MIL/uL (ref 3.87–5.11)
RDW: 14.3 % (ref 11.5–15.5)
WBC: 5.7 10*3/uL (ref 4.0–10.5)
nRBC: 0 % (ref 0.0–0.2)

## 2021-02-06 LAB — LIPASE, BLOOD: Lipase: 26 U/L (ref 11–51)

## 2021-02-06 LAB — CBG MONITORING, ED: Glucose-Capillary: 116 mg/dL — ABNORMAL HIGH (ref 70–99)

## 2021-02-06 LAB — GLUCOSE, CAPILLARY
Glucose-Capillary: 105 mg/dL — ABNORMAL HIGH (ref 70–99)
Glucose-Capillary: 139 mg/dL — ABNORMAL HIGH (ref 70–99)

## 2021-02-06 MED ORDER — ACETAMINOPHEN 650 MG RE SUPP: 650.0000 mg | Freq: Four times a day (QID) | RECTAL | Status: DC | PRN

## 2021-02-06 MED ORDER — CLONIDINE HCL 0.1 MG PO TABS
0.1000 mg | ORAL_TABLET | Freq: Three times a day (TID) | ORAL | Status: DC
  Administered 2021-02-06 – 2021-02-07 (×3): 0.1 mg via ORAL
  Filled 2021-02-06 (×3): qty 1

## 2021-02-06 MED ORDER — POLYETHYLENE GLYCOL 3350 17 G PO PACK: 17.0000 g | PACK | Freq: Every day | ORAL | Status: DC | PRN

## 2021-02-06 MED ORDER — ASPIRIN 81 MG PO TABS: 81.0000 mg | ORAL_TABLET | Freq: Every day | ORAL | Status: DC

## 2021-02-06 MED ORDER — HEPARIN SODIUM (PORCINE) 5000 UNIT/ML IJ SOLN
5000.0000 [IU] | Freq: Three times a day (TID) | INTRAMUSCULAR | Status: DC
  Administered 2021-02-06 – 2021-02-07 (×3): 5000 [IU] via SUBCUTANEOUS
  Filled 2021-02-06 (×3): qty 1

## 2021-02-06 MED ORDER — SODIUM CHLORIDE 0.9 % IV SOLN
1.0000 g | Freq: Once | INTRAVENOUS | Status: AC
  Administered 2021-02-06: 1 g via INTRAVENOUS
  Filled 2021-02-06: qty 10

## 2021-02-06 MED ORDER — ASPIRIN EC 81 MG PO TBEC
81.0000 mg | DELAYED_RELEASE_TABLET | Freq: Every day | ORAL | Status: DC
  Administered 2021-02-06: 81 mg via ORAL
  Filled 2021-02-06 (×2): qty 1

## 2021-02-06 MED ORDER — VENLAFAXINE HCL ER 75 MG PO CP24
225.0000 mg | ORAL_CAPSULE | Freq: Every day | ORAL | Status: DC
  Administered 2021-02-07: 225 mg via ORAL
  Filled 2021-02-06: qty 1

## 2021-02-06 MED ORDER — SODIUM CHLORIDE 0.9 % IV SOLN
1.0000 g | Freq: Four times a day (QID) | INTRAVENOUS | Status: DC
  Administered 2021-02-06 – 2021-02-07 (×4): 1 g via INTRAVENOUS
  Filled 2021-02-06 (×3): qty 1000
  Filled 2021-02-06: qty 1
  Filled 2021-02-06: qty 1000

## 2021-02-06 MED ORDER — SODIUM CHLORIDE 0.9 % IV SOLN: 1.0000 g | INTRAVENOUS | Status: DC

## 2021-02-06 MED ORDER — ALBUTEROL SULFATE HFA 108 (90 BASE) MCG/ACT IN AERS: 2.0000 | INHALATION_SPRAY | Freq: Four times a day (QID) | RESPIRATORY_TRACT | Status: DC | PRN

## 2021-02-06 MED ORDER — METOPROLOL TARTRATE 5 MG/5ML IV SOLN: 5.0000 mg | Freq: Four times a day (QID) | INTRAVENOUS | Status: DC | PRN

## 2021-02-06 MED ORDER — ACETAMINOPHEN 325 MG PO TABS: 650.0000 mg | ORAL_TABLET | Freq: Four times a day (QID) | ORAL | Status: DC | PRN

## 2021-02-06 MED ORDER — SODIUM CHLORIDE 0.9 % IV SOLN: INTRAVENOUS | Status: AC

## 2021-02-06 MED ORDER — SODIUM CHLORIDE 0.9 % IV BOLUS
500.0000 mL | Freq: Once | INTRAVENOUS | Status: AC
  Administered 2021-02-06: 500 mL via INTRAVENOUS

## 2021-02-06 NOTE — Telephone Encounter (Signed)
Spoke with patient Friday and advised to take macrobid instead of septra. Spoke with PCP who advised for patient to be seen in ER. Patient's sodium is low, confusion, and a fall over the weekend - states she feels like she cannot control her legs.

## 2021-02-06 NOTE — Telephone Encounter (Signed)
Nurse Assessment Nurse: Doy Mince, RN, [place] Ginger Date/Time (Eastern Time): 02/03/2021 8:40:37 PM Confirm and document reason for call. If symptomatic, describe symptoms. ---Caller states that she was in the office this morning. Needs to know which of two new Rx to take. She has back pain, UTI, discharge, shaking from fever, temperature of 100.9. Does the patient have any new or worsening symptoms? ---Yes Will a triage be completed? ---No Select reason for no triage. ---Patient declined Disp. Time Eilene Ghazi Time) Disposition Final User 02/03/2021 8:46:57 PM Clinical Call Yes Doy Mince, RN, [place] Ginger Comments User: Hilarie Fredrickson Date/Time Eilene Ghazi Time): 02/03/2021 6:15:42 PM Caller was told it would be a few minutes and is calling back after waiting for a nurse call back User: Wendee Beavers, RN Date/Time (Eastern Time): 02/03/2021 8:46:43 PM Patient refused help from RN tonight, stating that it can wait til tomorrow because she was sleeping. RN advised patient to call back tomorrow if that is what she wants to do instead

## 2021-02-06 NOTE — H&P (Signed)
History and Physical        Hospital Admission Note Date: 02/06/2021  Patient name: Jessica Curry record number: [id number] Date of birth: 1951/10/14 Age: 70 y.o. Gender: female  PCP: Leamon Arnt, MD    Chief Complaint    Chief Complaint  Patient presents with  . Weakness  . Urinary Tract Infection  . Abnormal Lab  . Abdominal Pain      HPI:   This is a 70 year old female with past medical history of diabetes, hypertension, hyperlipidemia who was sent to the ED for evaluation of hyponatremia and UTI by her PCP.  About 1 week ago she developed lower abdominal pain with occasional radiation to right flank.  She was initially started on trimethoprim/sulfamethoxazole, however culture came back as Enterococcus faecium and she was changed to nitrofurantoin on 3/11. Unclear if she was taking this. Admits to fever 100.9 at home.  She was also informed that she was hyponatremic and needed to come to the ED for evaluation.  Additionally, patient felt weak in her legs bilaterally and had trouble getting into bed last night and slid to the ground multiple times due to the weakness. Did not lose consciousness or hit her head. Denies dysuria but says her urine has been dark. Has bilateral back pain and has had poor PO intake for the past several days. Admits to three glasses of wine nightly and had wine yesterday, denies any history of withdrawal.   ED Course: Afebrile, hemodynamically stable, on room air. Notable Labs: Sodium 125, K3.8, chloride 89, glucose 147, BUN 25, creatinine 1.74, alk phos 135, AST 20, ALT 28, WBC 5.7, Hb 11.6, COVID-19 pending.  Urine culture from 3/11 growing pansensitive Enterococcus faecium.  Notable Imaging: CT abdomen pelvis without contrast-no acute abnormality however 1.2 cm partially exophytic slightly complex cystic lesion of the left lower pole which  is greater in density than a simple cyst and a nonemergent renal ultrasound was recommended (see CT for further chronic findings). Patient received 500 cc NS bolus and ceftriaxone.    Vitals:   02/06/21 1612 02/06/21 1652  BP: 140/86 139/81  Pulse: 60 (!) 55  Resp: 18 20  Temp:  97.6 F (36.4 C)  SpO2: 100% 96%     Review of Systems:  Review of Systems  All other systems reviewed and are negative.   Medical/Social/Family History   Past Medical History: Past Medical History:  Diagnosis Date  . Allergy   . Asthma   . Chicken pox   . Depression    reasonable control on venlafaxine 69m TID. xanax #14 lasts about 2 years for anxiety portion.   . Diabetes mellitus   . Genital warts    no outbreaks since the 839s . History of heart artery stent 02/10/2020  . Hyperlipidemia   . Hypertension     Past Surgical History:  Procedure Laterality Date  . CORONARY ANGIOPLASTY WITH STENT PLACEMENT     2012, [place]  . DILATION AND CURETTAGE OF UTERUS    . TONSILLECTOMY  1968    Medications: Prior to Admission medications   Medication Sig Start Date End Date Taking? Authorizing Provider  albuterol (PROAIR HFA) 108 (90 Base) MCG/ACT  inhaler Inhale 2 puffs into the lungs every 6 (six) hours as needed for wheezing or shortness of breath. 02/28/18  Yes Rigoberto Noel, MD  ALPRAZolam Duanne Moron) 0.5 MG tablet Take 1 tablet (0.5 mg total) by mouth daily as needed for anxiety. Patient taking differently: Take 0.25-0.5 mg by mouth daily as needed for anxiety. 02/01/21  Yes Leamon Arnt, MD  aspirin 81 MG tablet Take 81 mg by mouth at bedtime.   Yes [provider]  cloNIDine (CATAPRES) 0.1 MG tablet Take 1 tablet (0.1 mg total) by mouth 3 (three) times daily. 12/26/20  Yes Leamon Arnt, MD  diclofenac (VOLTAREN) 75 MG EC tablet Take 1 tablet (75 mg total) by mouth 2 (two) times daily as needed. Patient taking differently: Take 75 mg by mouth 2 (two) times daily as needed for mild  pain. 02/01/21  Yes Leamon Arnt, MD  diphenhydrAMINE (BENADRYL) 25 MG tablet Take 25 mg by mouth every 6 (six) hours as needed for allergies or sleep.   Yes [provider]  Ibuprofen-Acetaminophen 125-250 MG TABS Take 2 tablets by mouth every 6 (six) hours as needed (pain).   Yes [provider]  lisinopril-hydrochlorothiazide (ZESTORETIC) 20-25 MG tablet TAKE 1 TABLET BY MOUTH  DAILY 11/02/20  Yes Marin Olp, MD  metFORMIN (GLUCOPHAGE) 1000 MG tablet TAKE 1 TABLET BY MOUTH  TWICE DAILY WITH MEALS 11/02/20  Yes Marin Olp, MD  Multiple Vitamins-Minerals (MULTIVITAMIN WITH MINERALS) tablet Take 1 tablet by mouth at bedtime. Centrum silver for women   Yes [provider]  nitrofurantoin, macrocrystal-monohydrate, (MACROBID) 100 MG capsule Take 1 capsule (100 mg total) by mouth 2 (two) times daily. 02/03/21  Yes Leamon Arnt, MD  nitroGLYCERIN (NITROSTAT) 0.4 MG SL tablet Place 1 tablet (0.4 mg total) under the tongue every 5 (five) minutes as needed for chest pain. 02/11/18 03/24/20 Yes Burtis Junes, NP  sulfamethoxazole-trimethoprim (BACTRIM DS) 800-160 MG tablet Take 1 tablet by mouth 2 (two) times daily. 02/03/21  Yes Leamon Arnt, MD  venlafaxine XR (EFFEXOR-XR) 75 MG 24 hr capsule TAKE THREE CAPSULES BY MOUTH DAILY WITH BREAKFAST Patient taking differently: Take 225 mg by mouth daily with breakfast. 04/06/20  Yes Leamon Arnt, MD  baclofen (LIORESAL) 10 MG tablet Take 1 tablet (10 mg total) by mouth 3 (three) times daily as needed for muscle spasms. Patient not taking: Reported on 02/06/2021 09/15/20   Leamon Arnt, MD  rosuvastatin (CRESTOR) 20 MG tablet Take 1 tablet (20 mg total) by mouth daily. Patient not taking: Reported on 02/06/2021 01/05/21   Leamon Arnt, MD    Allergies:  No Known Allergies  Social History:  reports that she has been smoking cigarettes. She has a 14.40 pack-year smoking history. She has never used smokeless  tobacco. She reports current alcohol use of about 4.0 standard drinks of alcohol per week. She reports that she does not use drugs.  Family History: Family History  Problem Relation Age of Onset  . Arthritis Mother   . Ovarian cancer Mother   . AAA (abdominal aortic aneurysm) Mother   . Kidney disease Mother   . Hypothyroidism Mother   . Other Father        does not know family history for father  . Stroke Brother        retired MD. [employer]  . Heart attack Brother   . Arthritis Brother        hip replacement  .  Hyperlipidemia Brother   . Hypertension Brother   . Dementia Maternal Grandmother   . Colon cancer Neg Hx   . Esophageal cancer Neg Hx   . Rectal cancer Neg Hx   . Stomach cancer Neg Hx      Objective   Physical Exam: Blood pressure 139/81, pulse (!) 55, temperature 97.6 F (36.4 C), temperature source Oral, resp. rate 20, height _0  (1.727 m), weight 89.4 kg, SpO2 96 %.  Physical Exam Vitals and nursing note reviewed.  Constitutional:      Appearance: Normal appearance.  HENT:     Head: Normocephalic and atraumatic.  Eyes:     Conjunctiva/sclera: Conjunctivae normal.  Cardiovascular:     Rate and Rhythm: Normal rate and regular rhythm.  Pulmonary:     Effort: Pulmonary effort is normal.     Breath sounds: Normal breath sounds.  Abdominal:     General: Abdomen is flat.     Palpations: Abdomen is soft.  Musculoskeletal:        General: No swelling or tenderness.  Skin:    Coloration: Skin is not jaundiced or pale.  Neurological:     Mental Status: She is alert. Mental status is at baseline.  Psychiatric:        Mood and Affect: Mood normal.        Behavior: Behavior normal.     LABS on Admission: I have personally reviewed all the labs and imaging below    Basic Metabolic Panel: Recent Labs  Lab 02/03/21 1018 02/06/21 1228  NA 126* 125*  K 4.1 3.8  CL 91* 89*  CO2 26 27  GLUCOSE 144* 147*  BUN 20 25*  CREATININE 1.27* 1.74*  CALCIUM 9.8  9.9   Liver Function Tests: Recent Labs  Lab 02/01/21 1535 02/06/21 1228  AST 16 28  ALT 13 28  ALKPHOS  --  135*  BILITOT 0.7 0.6  PROT 6.6 7.1  ALBUMIN  --  3.5   Recent Labs  Lab 02/06/21 1228  LIPASE 26   No results for input(s): AMMONIA in the last 168 hours. CBC: Recent Labs  Lab 02/03/21 1018 02/06/21 1228  WBC 7.8 5.7  NEUTROABS 6.5  --   HGB 12.3 11.6*  HCT 35.0* 33.5*  MCV 86.5 85.9  PLT 256.0 238   Cardiac Enzymes: No results for input(s): CKTOTAL, CKMB, CKMBINDEX, TROPONINI in the last 168 hours. BNP: Invalid input(s): POCBNP CBG: Recent Labs  Lab 02/06/21 1352 02/06/21 1755  GLUCAP 116* 105*    Radiological Exams on Admission:  CT ABDOMEN PELVIS WO CONTRAST  Result Date: 02/06/2021 CLINICAL DATA:  known UTI, abnormal lab sodium level, and weakness. Patient also c/o lower abdominal pain that radiates into the back x 1 week. EXAM: CT ABDOMEN AND PELVIS WITHOUT CONTRAST TECHNIQUE: Multidetector CT imaging of the abdomen and pelvis was performed following the standard protocol without IV contrast. COMPARISON:  CT abdomen pelvis July 22, 2014 FINDINGS: Lower chest: Bibasilar atelectasis. Normal size heart. No significant pericardial effusion/thickening. Hepatobiliary: Unremarkable noncontrast appearance of the hepatic parenchyma. Cholelithiasis without findings of acute cholecystitis. No biliary ductal dilation. Pancreas: Unremarkable. Spleen: Unremarkable. Adrenals/Urinary Tract: Unchanged size of the 1.9 cm right adrenal adenoma. Left adrenal gland is unremarkable. No hydronephrosis. No nephrolithiasis. Exophytic 1.2 cm right lower pole renal cyst. There are other small bilateral subcentimeter hypodense lesions which are technically too small to accurately characterized above favored to represent cysts. Within the lower pole of the LEFT kidney there is a  partially exophytic more complex appearing cystic renal lesion which measures 1.2 cm on image 36/2 which  has a density greater than that of a simple cyst, and may have been present on prior imaging representing 2 adjacent cysts with intervening renal parenchyma however this is not well evaluated on today study. Stomach/Bowel: Decompressed but otherwise unremarkable. Normal positioning of the duodenum/ligament of [place]. No suspicious small bowel wall thickening or dilation. The appendix appears unremarkable. Sigmoid colonic diverticulosis without findings of acute diverticulitis. Vascular/Lymphatic: Aortic atherosclerosis. No enlarged abdominal or pelvic lymph nodes. Reproductive: Leiomyomatous uterus. Bilateral adnexa are unremarkable. Other: No abdominopelvic ascites. Musculoskeletal: L2-L3 discogenic disease vacuum disc artifact, sclerotic endplate changes and Schmorl's nodes. IMPRESSION: 1. No acute abdominopelvic abnormality. Specifically no evidence of hydronephrosis or nephrolithiasis. 2. There is a 1.2 cm partially exophytic slightly complex appearing cystic lesion in the lower pole of the LEFT kidney which has a density greater than that of a simple cyst, and may have been present on prior imaging representing 2 adjacent cysts with intervening renal parenchyma. However, this is not well evaluated on today's study. Nonemergent renal ultrasound is recommended to further evaluate. 3. Cholelithiasis without findings of acute cholecystitis. 4. Unchanged size of the right adrenal adenoma. 5. Sigmoid colonic diverticulosis without findings of acute diverticulitis. 6. L2-L3 discogenic disease. 7. Aortic atherosclerosis.  Aortic Atherosclerosis (ICD10-I70.0). Electronically Signed   By: Dahlia Bailiff MD   On: 02/06/2021 13:51      EKG: Not done   A & P   Principal Problem:   Hyponatremia Active Problems:   Hypertension associated with diabetes ([place])   Hyperlipidemia associated with type 2 diabetes mellitus ([place])   UTI (urinary tract infection)   Generalized weakness   Renal cyst,  left   1. Hyponatremia a. Likely from poor PO intake b. Na 125, chronically low 130s likely from alcohol c. IV fluids and encourage PO intake  2. Enterococcus faecium UTI a. urine culture from 3/11, unclear if she started nitrofurantoin b. Fevers at home c. Sensitive to Amicillin  3. Generalized weakness, likely from UTI and hyponatremia a. Treatment as above b. Also on multiple sedating meds: baclofen,  c. PT eval  4. Diabetes a. Hold metformin b. Carb controlled diet and monitor POC glucose  5. Hypertension a. Continue home clonidine b. Holding home lisinopril-HCTZ  6. Elevated Creatinine a. Cr 1.74, baseline 1.0 in January  7. Hyperlipidemia a. Continue statin  8. Left renal cyst  a. Outpatient renal US  9. Alcohol use a. Encouraged cessation b. Not actively withdrawing    DVT prophylaxis: heparin   Code Status: Full Code  Diet: carb modified Family Communication: Admission, patients condition and plan of care including tests being ordered have been discussed with the patient who indicates understanding and agrees with the plan and Code Status.  Disposition Plan: The appropriate patient status for this patient is OBSERVATION. Observation status is judged to be reasonable and necessary in order to provide the required intensity of service to ensure the patient's safety. The patient's presenting symptoms, physical exam findings, and initial radiographic and laboratory data in the context of their medical condition is felt to place them at decreased risk for further clinical deterioration. Furthermore, it is anticipated that the patient will be medically stable for discharge from the hospital within 2 midnights of admission. The following factors support the patient status of observation.   " The patient's presenting symptoms include generalized weakness, abnormal lab work. " The physical exam findings include unremarkable. "  The initial radiographic and laboratory  data are concerning for cyst and elevated creatinine.    Status is: Observation  The patient remains OBS appropriate and will d/c before 2 midnights.  Dispo: The patient is from: Home              Anticipated d/c is to: Home              Patient currently is not medically stable to d/c.   Difficult to place patient No        Consultants  . none  Procedures  . none  Time Spent on Admission: 55 minutes    Harold Hedge, DO [facility]  02/06/2021, 6:09 PM

## 2021-02-06 NOTE — ED Provider Notes (Addendum)
[facility] DEPT Provider Note   CSN: [id number] Arrival date & time: 02/06/21  1117     History Chief Complaint  Patient presents with  . Weakness  . Urinary Tract Infection  . Abnormal Lab  . Abdominal Pain    Jessica Curry is a 70 y.o. female history of diabetes, hypertension, hyperlipidemia, CAD.  Patient presents today, sent by her PCP for evaluation of hyponatremia and UTI.  Patient reports around a week ago she developed lower abdominal pain which will occasionally radiate to her right flank it is aching constant worsened with urination improves with rest, moderate intensity.  She reports she saw her PCP last week and was prescribed nitrofurantoin for this which she took over the weekend.  She reports that her PCP called her on Monday told her it was the wrong antibiotic and was prescribed Bactrim today.  At the same time she was informed she is hyponatremic and needed to come to the ER for evaluation.  Patient reports that yesterday she was at her home, getting out of bed when she felt her legs were too weak to support her she slid to the ground and had trouble getting back up due to bilateral leg weakness.  Denies fever/chills, head injury, loss consciousness, vision changes, neck pain, chest pain, back pain, constipation, numbness, unilateral weakness, tingling or any additional concerns HPI     Past Medical History:  Diagnosis Date  . Allergy   . Asthma   . Chicken pox   . Depression    reasonable control on venlafaxine 72m TID. xanax #14 lasts about 2 years for anxiety portion.   . Diabetes mellitus   . Genital warts    no outbreaks since the 885s . History of heart artery stent 02/10/2020  . Hyperlipidemia   . Hypertension     Patient Active Problem List   Diagnosis Date Noted  . UTI (urinary tract infection) 02/06/2021  . Hyponatremia 01/02/2021  . Hypercalcemia 01/02/2021  . Chronic midline low back pain without sciatica  12/26/2020  . Encounter for screening for lung cancer 02/10/2020  . History of heart artery stent 02/10/2020  . Mild intermittent asthma 08/06/2019  . Serrated polyp of colon 08/12/2018  . Simple chronic bronchitis ([place] 02/11/2018  . Osteopenia 06/10/2017  . Hyperlipidemia associated with type 2 diabetes mellitus ([place] 06/04/2017  . Diabetes mellitus type II, controlled ([facility] 09/10/2016  . CAD (coronary artery disease), native coronary artery 09/10/2016  . Hypertension associated with diabetes ([facility] 09/10/2016  . Smoker 09/10/2016  . Depression, major, recurrent, in partial remission ([facility]     Past Surgical History:  Procedure Laterality Date  . CORONARY ANGIOPLASTY WITH STENT PLACEMENT     2012, [place]  . DILATION AND CURETTAGE OF UTERUS    . TONSILLECTOMY  1968     OB History   No obstetric history on file.     Family History  Problem Relation Age of Onset  . Arthritis Mother   . Ovarian cancer Mother   . AAA (abdominal aortic aneurysm) Mother   . Kidney disease Mother   . Hypothyroidism Mother   . Other Father        does not know family history for father  . Stroke Brother        retired MD. [employer] . Heart attack Brother   . Arthritis Brother        hip replacement  . Hyperlipidemia Brother   . Hypertension Brother   .  Dementia Maternal Grandmother   . Colon cancer Neg Hx   . Esophageal cancer Neg Hx   . Rectal cancer Neg Hx   . Stomach cancer Neg Hx     Social History   Tobacco Use  . Smoking status: Current Every Day Smoker    Packs/day: 0.30    Years: 48.00    Pack years: 14.40    Types: Cigarettes  . Smokeless tobacco: Never Used  . Tobacco comment: handouts provided   Vaping Use  . Vaping Use: Never used  Substance Use Topics  . Alcohol use: Yes    Alcohol/week: 4.0 standard drinks    Types: 4 Cans of beer per week  . Drug use: No    Home Medications Prior to Admission medications   Medication Sig Start Date End Date Taking?  Authorizing Provider  albuterol (PROAIR HFA) 108 (90 Base) MCG/ACT inhaler Inhale 2 puffs into the lungs every 6 (six) hours as needed for wheezing or shortness of breath. 02/28/18   Rigoberto Noel, MD  ALPRAZolam Duanne Moron) 0.5 MG tablet Take 1 tablet (0.5 mg total) by mouth daily as needed for anxiety. 02/01/21   Leamon Arnt, MD  aspirin 81 MG tablet Take 81 mg by mouth daily.    [provider]  baclofen (LIORESAL) 10 MG tablet Take 1 tablet (10 mg total) by mouth 3 (three) times daily as needed for muscle spasms. 09/15/20   Leamon Arnt, MD  cloNIDine (CATAPRES) 0.1 MG tablet Take 1 tablet (0.1 mg total) by mouth 3 (three) times daily. 12/26/20   Leamon Arnt, MD  diclofenac (VOLTAREN) 75 MG EC tablet Take 1 tablet (75 mg total) by mouth 2 (two) times daily as needed. 02/01/21   Leamon Arnt, MD  diphenhydrAMINE (BENADRYL) 25 MG tablet Take 25 mg by mouth every 6 (six) hours as needed.    [provider]  lisinopril-hydrochlorothiazide (ZESTORETIC) 20-25 MG tablet TAKE 1 TABLET BY MOUTH  DAILY 11/02/20   Marin Olp, MD  metFORMIN (GLUCOPHAGE) 1000 MG tablet TAKE 1 TABLET BY MOUTH  TWICE DAILY WITH MEALS 11/02/20   Marin Olp, MD  Multiple Vitamins-Minerals (MULTIVITAMIN WITH MINERALS) tablet Take 1 tablet by mouth daily. Centrum silver for women    [provider]  nitrofurantoin, macrocrystal-monohydrate, (MACROBID) 100 MG capsule Take 1 capsule (100 mg total) by mouth 2 (two) times daily. 02/03/21   Leamon Arnt, MD  nitroGLYCERIN (NITROSTAT) 0.4 MG SL tablet Place 1 tablet (0.4 mg total) under the tongue every 5 (five) minutes as needed for chest pain. 02/11/18 03/24/20  Burtis Junes, NP  rosuvastatin (CRESTOR) 20 MG tablet Take 1 tablet (20 mg total) by mouth daily. 01/05/21   Leamon Arnt, MD  sulfamethoxazole-trimethoprim (BACTRIM DS) 800-160 MG tablet Take 1 tablet by mouth 2 (two) times daily. 02/03/21   Leamon Arnt, MD  venlafaxine XR  (EFFEXOR-XR) 75 MG 24 hr capsule TAKE THREE CAPSULES BY MOUTH DAILY WITH BREAKFAST 04/06/20   Leamon Arnt, MD    Allergies    Patient has no known allergies.  Review of Systems   Review of Systems Ten systems are reviewed and are negative for acute change except as noted in the HPI  Physical Exam Updated Vital Signs BP 118/77   Pulse 86   Temp 97.6 F (36.4 C) (Oral)   Resp 18   Ht 5' 8"  (1.727 m)   Wt 89.4 kg   LMP  (LMP Unknown)  SpO2 98%   BMI 29.95 kg/m   Physical Exam Constitutional:      General: She is not in acute distress.    Appearance: Normal appearance. She is well-developed. She is not ill-appearing or diaphoretic.  HENT:     Head: Normocephalic and atraumatic.     Jaw: There is normal jaw occlusion.     Right Ear: External ear normal.     Left Ear: External ear normal.     Nose: Nose normal.     Mouth/Throat:     Mouth: Mucous membranes are moist.     Pharynx: Oropharynx is clear.  Eyes:     General: Vision grossly intact. Gaze aligned appropriately.     Pupils: Pupils are equal, round, and reactive to light.  Neck:     Trachea: Trachea and phonation normal.  Pulmonary:     Effort: Pulmonary effort is normal. No respiratory distress.  Abdominal:     General: There is no distension.     Palpations: Abdomen is soft.     Tenderness: There is no abdominal tenderness. There is no guarding or rebound.  Musculoskeletal:        General: Normal range of motion.     Cervical back: Normal range of motion.  Skin:    General: Skin is warm and dry.  Neurological:     Mental Status: She is alert.     GCS: GCS eye subscore is 4. GCS verbal subscore is 5. GCS motor subscore is 6.     Comments: Speech is clear and goal oriented, follows commands Major Cranial nerves without deficit, no facial droop Normal strength in upper and lower extremities bilaterally including dorsiflexion and plantar flexion, strong and equal grip strength Sensation normal to light and  sharp touch Moves extremities without ataxia, coordination intact  Psychiatric:        Behavior: Behavior normal.     ED Results / Procedures / Treatments   Labs (all labs ordered are listed, but only abnormal results are displayed) Labs Reviewed  COMPREHENSIVE METABOLIC PANEL - Abnormal; Notable for the following components:      Result Value   Sodium 125 (*)    Chloride 89 (*)    Glucose, Bld 147 (*)    BUN 25 (*)    Creatinine, Ser 1.74 (*)    Alkaline Phosphatase 135 (*)    GFR, Estimated 31 (*)    All other components within normal limits  CBC - Abnormal; Notable for the following components:   Hemoglobin 11.6 (*)    HCT 33.5 (*)    All other components within normal limits  CBG MONITORING, ED - Abnormal; Notable for the following components:   Glucose-Capillary 116 (*)    All other components within normal limits  RESP PANEL BY RT-PCR (FLU A&B, COVID) ARPGX2  LIPASE, BLOOD  URINALYSIS, ROUTINE W REFLEX MICROSCOPIC    EKG None  Radiology CT ABDOMEN PELVIS WO CONTRAST  Result Date: 02/06/2021 CLINICAL DATA:  known UTI, abnormal lab sodium level, and weakness. Patient also c/o lower abdominal pain that radiates into the back x 1 week. EXAM: CT ABDOMEN AND PELVIS WITHOUT CONTRAST TECHNIQUE: Multidetector CT imaging of the abdomen and pelvis was performed following the standard protocol without IV contrast. COMPARISON:  CT abdomen pelvis July 22, 2014 FINDINGS: Lower chest: Bibasilar atelectasis. Normal size heart. No significant pericardial effusion/thickening. Hepatobiliary: Unremarkable noncontrast appearance of the hepatic parenchyma. Cholelithiasis without findings of acute cholecystitis. No biliary ductal dilation. Pancreas: Unremarkable. Spleen:  Unremarkable. Adrenals/Urinary Tract: Unchanged size of the 1.9 cm right adrenal adenoma. Left adrenal gland is unremarkable. No hydronephrosis. No nephrolithiasis. Exophytic 1.2 cm right lower pole renal cyst. There are other  small bilateral subcentimeter hypodense lesions which are technically too small to accurately characterized above favored to represent cysts. Within the lower pole of the LEFT kidney there is a partially exophytic more complex appearing cystic renal lesion which measures 1.2 cm on image 36/2 which has a density greater than that of a simple cyst, and may have been present on prior imaging representing 2 adjacent cysts with intervening renal parenchyma however this is not well evaluated on today study. Stomach/Bowel: Decompressed but otherwise unremarkable. Normal positioning of the duodenum/ligament of [place]. No suspicious small bowel wall thickening or dilation. The appendix appears unremarkable. Sigmoid colonic diverticulosis without findings of acute diverticulitis. Vascular/Lymphatic: Aortic atherosclerosis. No enlarged abdominal or pelvic lymph nodes. Reproductive: Leiomyomatous uterus. Bilateral adnexa are unremarkable. Other: No abdominopelvic ascites. Musculoskeletal: L2-L3 discogenic disease vacuum disc artifact, sclerotic endplate changes and Schmorl's nodes. IMPRESSION: 1. No acute abdominopelvic abnormality. Specifically no evidence of hydronephrosis or nephrolithiasis. 2. There is a 1.2 cm partially exophytic slightly complex appearing cystic lesion in the lower pole of the LEFT kidney which has a density greater than that of a simple cyst, and may have been present on prior imaging representing 2 adjacent cysts with intervening renal parenchyma. However, this is not well evaluated on today's study. Nonemergent renal ultrasound is recommended to further evaluate. 3. Cholelithiasis without findings of acute cholecystitis. 4. Unchanged size of the right adrenal adenoma. 5. Sigmoid colonic diverticulosis without findings of acute diverticulitis. 6. L2-L3 discogenic disease. 7. Aortic atherosclerosis.  Aortic Atherosclerosis (ICD10-I70.0). Electronically Signed   By: Dahlia Bailiff MD   On: 02/06/2021  13:51    Procedures Procedures   Medications Ordered in ED Medications  cefTRIAXone (ROCEPHIN) 1 g in sodium chloride 0.9 % 100 mL IVPB (1 g Intravenous New Bag/Given 02/06/21 1414)  sodium chloride 0.9 % bolus 500 mL (500 mLs Intravenous New Bag/Given 02/06/21 1345)    ED Course  I have reviewed the triage vital signs and the nursing notes.  Pertinent labs & imaging results that were available during my care of the patient were reviewed by me and considered in my medical decision making (see chart for details).    MDM Rules/Calculators/A&P                         Additional history obtained from: 1. Nursing notes from this visit. 2. Review of electronic medical records.  Patient's UA from 02/01/2021 grew out Enterococcus, appears sensitive to ampicillin, vancomycin and nitrofurantoin. ==================== I ordered, reviewed and interpreted labs which include: CBC without leukocytosis or thrombocytopenia.  Shows mild anemia of 11.6. CBG 116. Lipase within normal limits. CMP shows hyponatremia 125, hypochloremia 89.  AKI with creatinine 1.74 and BUN 25.  Alk phos slightly elevated 135.  No gap. Urinalysis is pending.  CT AP w/o:  IMPRESSION:  1. No acute abdominopelvic abnormality. Specifically no evidence of  hydronephrosis or nephrolithiasis.  2. There is a 1.2 cm partially exophytic slightly complex appearing  cystic lesion in the lower pole of the LEFT kidney which has a  density greater than that of a simple cyst, and may have been  present on prior imaging representing 2 adjacent cysts with  intervening renal parenchyma. However, this is not well evaluated on  today's study. Nonemergent renal ultrasound is  recommended to  further evaluate.  3. Cholelithiasis without findings of acute cholecystitis.  4. Unchanged size of the right adrenal adenoma.  5. Sigmoid colonic diverticulosis without findings of acute  diverticulitis.  6. L2-L3 discogenic disease.  7. Aortic  atherosclerosis. Aortic Atherosclerosis (ICD10-I70.0).  = Patient reassessed resting comfortably bed reading a book no acute distress vital signs are stable.  Plan of care is admission for treatment of UTI, AKI and hyponatremia.  Patient receiving IV fluids, Rocephin ordered for treatment of UTI.  Patient is agreeable for admission.  Patient was made aware of kidney lesion and need for follow-up ultrasound. - 2:25 PM: Consult with Dr. Neysa Bonito, patient accepted to medicine service.    Note: Portions of this report may have been transcribed using voice recognition software. Every effort was made to ensure accuracy; however, inadvertent computerized transcription errors may still be present. Final Clinical Impression(s) / ED Diagnoses Final diagnoses:  Lower urinary tract infectious disease  AKI (acute kidney injury) ([place])  Hyponatremia    Rx / DC Orders ED Discharge Orders    None       Deliah Boston, PA-C 02/06/21 1425    Gari Crown 02/06/21 1425    Milton Ferguson, MD 02/07/21 [phone]

## 2021-02-06 NOTE — Progress Notes (Signed)
Please call patient: I have reviewed his/her lab results. Lab results show a low sodium level: this could be causing her fatigue. It could be due to dehydration since she is not eating or drinking much. Please call her to see how she is feeling now that she is on antibiotics. Need recheck BMP today. To ER for any altered mental status or persistent lethargy.

## 2021-02-06 NOTE — Plan of Care (Signed)

## 2021-02-06 NOTE — ED Triage Notes (Addendum)
Patient reports a known UTI, abnormal lab sodium level, and weakness. patient states she was sent to the ED for IV fluids.  Patient also c/o lower abdominal pain that radiates into the back x 1 week.

## 2021-02-07 DIAGNOSIS — E871 Hypo-osmolality and hyponatremia: Secondary | ICD-10-CM

## 2021-02-07 DIAGNOSIS — Z789 Other specified health status: Secondary | ICD-10-CM

## 2021-02-07 DIAGNOSIS — Z7289 Other problems related to lifestyle: Secondary | ICD-10-CM

## 2021-02-07 DIAGNOSIS — N1831 Chronic kidney disease, stage 3a: Secondary | ICD-10-CM

## 2021-02-07 DIAGNOSIS — N179 Acute kidney failure, unspecified: Secondary | ICD-10-CM

## 2021-02-07 LAB — BASIC METABOLIC PANEL
Anion gap: 10 (ref 5–15)
Anion gap: 8 (ref 5–15)
BUN: 18 mg/dL (ref 8–23)
BUN: 18 mg/dL (ref 8–23)
CO2: 25 mmol/L (ref 22–32)
CO2: 26 mmol/L (ref 22–32)
Calcium: 9.6 mg/dL (ref 8.9–10.3)
Calcium: 9.8 mg/dL (ref 8.9–10.3)
Chloride: 92 mmol/L — ABNORMAL LOW (ref 98–111)
Chloride: 93 mmol/L — ABNORMAL LOW (ref 98–111)
Creatinine, Ser: 1.37 mg/dL — ABNORMAL HIGH (ref 0.44–1.00)
Creatinine, Ser: 1.28 mg/dL — ABNORMAL HIGH (ref 0.44–1.00)
GFR, Estimated: 42 mL/min — ABNORMAL LOW (ref >60)
GFR, Estimated: 45 mL/min — ABNORMAL LOW (ref >60)
Glucose, Bld: 112 mg/dL — ABNORMAL HIGH (ref 70–99)
Glucose, Bld: 182 mg/dL — ABNORMAL HIGH (ref 70–99)
Potassium: 3.6 mmol/L (ref 3.5–5.1)
Potassium: 4.4 mmol/L (ref 3.5–5.1)
Sodium: 127 mmol/L — ABNORMAL LOW (ref 135–145)
Sodium: 127 mmol/L — ABNORMAL LOW (ref 135–145)

## 2021-02-07 LAB — CBC
HCT: 31.2 % — ABNORMAL LOW (ref 36.0–46.0)
Hemoglobin: 10.5 g/dL — ABNORMAL LOW (ref 12.0–15.0)
MCH: 29.7 pg (ref 26.0–34.0)
MCHC: 33.7 g/dL (ref 30.0–36.0)
MCV: 88.1 fL (ref 80.0–100.0)
Platelets: 249 10*3/uL (ref 150–400)
RBC: 3.54 MIL/uL — ABNORMAL LOW (ref 3.87–5.11)
RDW: 14.2 % (ref 11.5–15.5)
WBC: 4.6 10*3/uL (ref 4.0–10.5)
nRBC: 0 % (ref 0.0–0.2)

## 2021-02-07 LAB — HIV ANTIBODY (ROUTINE TESTING W REFLEX): HIV Screen 4th Generation wRfx: NONREACTIVE

## 2021-02-07 LAB — GLUCOSE, CAPILLARY
Glucose-Capillary: 106 mg/dL — ABNORMAL HIGH (ref 70–99)
Glucose-Capillary: 121 mg/dL — ABNORMAL HIGH (ref 70–99)

## 2021-02-07 LAB — TSH: TSH: 1.807 u[IU]/mL (ref 0.350–4.500)

## 2021-02-07 MED ORDER — SODIUM CHLORIDE 0.9 % IV SOLN: INTRAVENOUS | Status: DC

## 2021-02-07 NOTE — Plan of Care (Signed)
Poc discussed with pt 

## 2021-02-07 NOTE — Discharge Summary (Signed)
Physician Discharge Summary  Jessica Curry VPX:[id number] DOB: [birth year] DOA: 02/06/2021  PCP: Leamon Arnt, MD  Admit date: 02/06/2021 Discharge date: 02/07/2021  Admitted From: Home Disposition:  Home  Recommendations for Outpatient Follow-up:  1. Follow up with PCP in 1 week 2. Repeat BMP to check sodium level and creatinine  3. There is a 1.2 cm partially exophytic slightly complex appearing cystic lesion in the lower pole of the LEFT kidney which has a density greater than that of a simple cyst, and may have been present on prior imaging representing 2 adjacent cysts with intervening renal parenchyma. However, this is not well evaluated on today's study. Nonemergent renal ultrasound is recommended to further evaluate.  Discharge Condition: Stable CODE STATUS: Full  Diet recommendation: Carb modified   Brief/Interim Summary: From H&P by Dr. Neysa Bonito: "This is a 70 year old female with past medical history of diabetes, hypertension, hyperlipidemia who was sent to the ED for evaluation of hyponatremia and UTI by her PCP.  About 1 week ago she developed lower abdominal pain with occasional radiation to right flank.  She was initially started on trimethoprim/sulfamethoxazole, however culture came back as Enterococcus faecium and she was changed to nitrofurantoin on 3/11. Unclear if she was taking this. Admits to fever 100.9 at home.  She was also informed that she was hyponatremic and needed to come to the ED for evaluation.  Additionally, patient felt weak in her legs bilaterally and had trouble getting into bed last night and slid to the ground multiple times due to the weakness. Did not lose consciousness or hit her head. Denies dysuria but says her urine has been dark. Has bilateral back pain and has had poor PO intake for the past several days. Admits to three glasses of wine nightly and had wine yesterday, denies any history of withdrawal."   Interim: Patient was given IVF for hyponatremia  and acute kidney injury, started on ampicillin for UTI POA.  Sodium levels improved, creatinine improved as well.  Subjective on day of discharge: Complains of urinary urgency.  Overall, states that she is feeling better, wants to go home.  She worked with physical therapy prior to discharge home and was able to ambulate around the unit without any issues.  Discharge Diagnoses:  Principal Problem:   Hyponatremia Active Problems:   Diabetes mellitus type II, controlled ([place])   Hypertension associated with diabetes ([place])   Hyperlipidemia associated with type 2 diabetes mellitus ([facility])   UTI (urinary tract infection)   Generalized weakness   Renal cyst, left   AKI (acute kidney injury) ([place])   Alcohol use   Chronic kidney disease (CKD) stage G3a/A1, moderately decreased glomerular filtration rate (GFR) between 45-59 mL/min/1.73 square meter and albuminuria creatinine ratio less than 30 mg/g ([facility])   Chronic hyponatremia  Discharge Instructions  Discharge Instructions    Call MD for:  difficulty breathing, headache or visual disturbances   Complete by: As directed    Call MD for:  extreme fatigue   Complete by: As directed    Call MD for:  persistant dizziness or light-headedness   Complete by: As directed    Call MD for:  persistant nausea and vomiting   Complete by: As directed    Call MD for:  severe uncontrolled pain   Complete by: As directed    Call MD for:  temperature >100.4   Complete by: As directed    Discharge instructions   Complete by: As directed    You were  cared for by a hospitalist during your hospital stay. If you have any questions about your discharge medications or the care you received while you were in the hospital after you are discharged, you can call the unit and ask to speak with the hospitalist on call if the hospitalist that took care of you is not available. Once you are discharged, your primary care physician will handle any further medical issues. Please  note that NO REFILLS for any discharge medications will be authorized once you are discharged, as it is imperative that you return to your primary care physician (or establish a relationship with a primary care physician if you do not have one) for your aftercare needs so that they can reassess your need for medications and monitor your lab values.   Increase activity slowly   Complete by: As directed      Allergies as of 02/07/2021   No Known Allergies     Medication List    STOP taking these medications   baclofen 10 MG tablet Commonly known as: LIORESAL   lisinopril-hydrochlorothiazide 20-25 MG tablet Commonly known as: ZESTORETIC   sulfamethoxazole-trimethoprim 800-160 MG tablet Commonly known as: BACTRIM DS     TAKE these medications   albuterol 108 (90 Base) MCG/ACT inhaler Commonly known as: ProAir HFA Inhale 2 puffs into the lungs every 6 (six) hours as needed for wheezing or shortness of breath.   ALPRAZolam 0.5 MG tablet Commonly known as: Xanax Take 1 tablet (0.5 mg total) by mouth daily as needed for anxiety. What changed: how much to take   aspirin 81 MG tablet Take 81 mg by mouth at bedtime.   cloNIDine 0.1 MG tablet Commonly known as: CATAPRES Take 1 tablet (0.1 mg total) by mouth 3 (three) times daily.   diclofenac 75 MG EC tablet Commonly known as: VOLTAREN Take 1 tablet (75 mg total) by mouth 2 (two) times daily as needed. What changed: reasons to take this   diphenhydrAMINE 25 MG tablet Commonly known as: BENADRYL Take 25 mg by mouth every 6 (six) hours as needed for allergies or sleep.   Ibuprofen-Acetaminophen 125-250 MG Tabs Take 2 tablets by mouth every 6 (six) hours as needed (pain).   metFORMIN 1000 MG tablet Commonly known as: GLUCOPHAGE TAKE 1 TABLET BY MOUTH  TWICE DAILY WITH MEALS   multivitamin with minerals tablet Take 1 tablet by mouth at bedtime. Centrum silver for women   nitrofurantoin (macrocrystal-monohydrate) 100 MG  capsule Commonly known as: MACROBID Take 1 capsule (100 mg total) by mouth 2 (two) times daily.   nitroGLYCERIN 0.4 MG SL tablet Commonly known as: NITROSTAT Place 1 tablet (0.4 mg total) under the tongue every 5 (five) minutes as needed for chest pain.   rosuvastatin 20 MG tablet Commonly known as: CRESTOR Take 1 tablet (20 mg total) by mouth daily.   venlafaxine XR 75 MG 24 hr capsule Commonly known as: EFFEXOR-XR TAKE THREE CAPSULES BY MOUTH DAILY WITH BREAKFAST What changed: See the new instructions.       Follow-up Information    Leamon Arnt, MD. Schedule an appointment as soon as possible for a visit in 1 week(s).   Specialty: Family Medicine Contact information: [place] [address] [phone]              No Known Allergies  Consultations:  None    Procedures/Studies: CT ABDOMEN PELVIS WO CONTRAST  Result Date: 02/06/2021 CLINICAL DATA:  known UTI, abnormal lab sodium level,  and weakness. Patient also c/o lower abdominal pain that radiates into the back x 1 week. EXAM: CT ABDOMEN AND PELVIS WITHOUT CONTRAST TECHNIQUE: Multidetector CT imaging of the abdomen and pelvis was performed following the standard protocol without IV contrast. COMPARISON:  CT abdomen pelvis July 22, 2014 FINDINGS: Lower chest: Bibasilar atelectasis. Normal size heart. No significant pericardial effusion/thickening. Hepatobiliary: Unremarkable noncontrast appearance of the hepatic parenchyma. Cholelithiasis without findings of acute cholecystitis. No biliary ductal dilation. Pancreas: Unremarkable. Spleen: Unremarkable. Adrenals/Urinary Tract: Unchanged size of the 1.9 cm right adrenal adenoma. Left adrenal gland is unremarkable. No hydronephrosis. No nephrolithiasis. Exophytic 1.2 cm right lower pole renal cyst. There are other small bilateral subcentimeter hypodense lesions which are technically too small to accurately characterized above favored to represent  cysts. Within the lower pole of the LEFT kidney there is a partially exophytic more complex appearing cystic renal lesion which measures 1.2 cm on image 36/2 which has a density greater than that of a simple cyst, and may have been present on prior imaging representing 2 adjacent cysts with intervening renal parenchyma however this is not well evaluated on today study. Stomach/Bowel: Decompressed but otherwise unremarkable. Normal positioning of the duodenum/ligament of [place]. No suspicious small bowel wall thickening or dilation. The appendix appears unremarkable. Sigmoid colonic diverticulosis without findings of acute diverticulitis. Vascular/Lymphatic: Aortic atherosclerosis. No enlarged abdominal or pelvic lymph nodes. Reproductive: Leiomyomatous uterus. Bilateral adnexa are unremarkable. Other: No abdominopelvic ascites. Musculoskeletal: L2-L3 discogenic disease vacuum disc artifact, sclerotic endplate changes and Schmorl's nodes. IMPRESSION: 1. No acute abdominopelvic abnormality. Specifically no evidence of hydronephrosis or nephrolithiasis. 2. There is a 1.2 cm partially exophytic slightly complex appearing cystic lesion in the lower pole of the LEFT kidney which has a density greater than that of a simple cyst, and may have been present on prior imaging representing 2 adjacent cysts with intervening renal parenchyma. However, this is not well evaluated on today's study. Nonemergent renal ultrasound is recommended to further evaluate. 3. Cholelithiasis without findings of acute cholecystitis. 4. Unchanged size of the right adrenal adenoma. 5. Sigmoid colonic diverticulosis without findings of acute diverticulitis. 6. L2-L3 discogenic disease. 7. Aortic atherosclerosis.  Aortic Atherosclerosis (ICD10-I70.0). Electronically Signed   By: Dahlia Bailiff MD   On: 02/06/2021 13:51      Discharge Exam: Vitals:   02/07/21 0805 02/07/21 1330  BP: 125/82 137/76  Pulse: (!) 53 (!) 55  Resp:    Temp:  98.1  F (36.7 C)  SpO2:  98%    General: Pt is alert, awake, not in acute distress Cardiovascular: RRR, S1/S2 +, no edema Respiratory: CTA bilaterally, no wheezing, no rhonchi, no respiratory distress, no conversational dyspnea  Abdominal: Soft, NT, ND, bowel sounds + Extremities: no edema, no cyanosis Psych: Normal mood and affect, stable judgement and insight     The results of significant diagnostics from this hospitalization (including imaging, microbiology, ancillary and laboratory) are listed below for reference.     Microbiology: Recent Results (from the past 240 hour(s))  Urine Culture     Status: Abnormal   Collection Time: 02/01/21  3:35 PM   Specimen: Urine  Result Value Ref Range Status   MICRO NUMBER: [id number]  Final   SPECIMEN QUALITY: Adequate  Final   Sample Source NOT GIVEN  Final   STATUS: FINAL  Final   ISOLATE 1: Enterococcus faecium (A)  Final    Comment: Greater than 100,000 CFU/mL of Enterococcus faecium      Susceptibility   Enterococcus  faecium - URINE CULTURE POSITIVE 1    AMPICILLIN <=2 Sensitive     VANCOMYCIN <=0.5 Sensitive     NITROFURANTOIN* 32 Sensitive      * Legend:S = Susceptible  I = IntermediateR = Resistant  NS = Not susceptible* = Not tested  NR = Not reported**NN = See antimicrobic comments  Urine Culture     Status: Abnormal   Collection Time: 02/03/21 10:18 AM   Specimen: Blood  Result Value Ref Range Status   MICRO NUMBER: [id number]  Final   SPECIMEN QUALITY: Adequate  Final   Sample Source NOT GIVEN  Final   STATUS: FINAL  Final   ISOLATE 1: Enterococcus faecium (A)  Final    Comment: Greater than 100,000 CFU/mL of Enterococcus faecium   COMMENT:   Final    Additional non-predominating organism(s) isolated. These organisms, commonly found on external and internal genitalia, are considered colonizers. No further testing performed.      Susceptibility   Enterococcus faecium - URINE CULTURE POSITIVE 1    AMPICILLIN <=2 Sensitive      VANCOMYCIN <=0.5 Sensitive     NITROFURANTOIN* 32 Sensitive      * Legend:S = Susceptible  I = IntermediateR = Resistant  NS = Not susceptible* = Not tested  NR = Not reported**NN = See antimicrobic comments  Resp Panel by RT-PCR (Flu A&B, Covid) Urine, Clean Catch     Status: None   Collection Time: 02/06/21  2:03 PM   Specimen: Urine, Clean Catch; Nasopharyngeal(NP) swabs in vial transport medium  Result Value Ref Range Status   SARS Coronavirus 2 by RT PCR NEGATIVE NEGATIVE Final    Comment: (NOTE) SARS-CoV-2 target nucleic acids are NOT DETECTED.  The SARS-CoV-2 RNA is generally detectable in upper respiratory specimens during the acute phase of infection. The lowest concentration of SARS-CoV-2 viral copies this assay can detect is 138 copies/mL. A negative result does not preclude SARS-Cov-2 infection and should not be used as the sole basis for treatment or other patient management decisions. A negative result may occur with  improper specimen collection/handling, submission of specimen other than nasopharyngeal swab, presence of viral mutation(s) within the areas targeted by this assay, and inadequate number of viral copies(<138 copies/mL). A negative result must be combined with clinical observations, patient history, and epidemiological information. The expected result is Negative.  Fact Sheet for Patients:  EntrepreneurPulse.com.au  Fact Sheet for Healthcare Providers:  IncredibleEmployment.be  This test is no t yet approved or cleared by the [place] FDA and  has been authorized for detection and/or diagnosis of SARS-CoV-2 by FDA under an Emergency Use Authorization (EUA). This EUA will remain  in effect (meaning this test can be used) for the duration of the COVID-19 declaration under Section 564(b)(1) of the Act, 21 U.S.C.section 360bbb-3(b)(1), unless the authorization is terminated  or revoked sooner.       Influenza A by  PCR NEGATIVE NEGATIVE Final   Influenza B by PCR NEGATIVE NEGATIVE Final    Comment: (NOTE) The Xpert Xpress SARS-CoV-2/FLU/RSV plus assay is intended as an aid in the diagnosis of influenza from Nasopharyngeal swab specimens and should not be used as a sole basis for treatment. Nasal washings and aspirates are unacceptable for Xpert Xpress SARS-CoV-2/FLU/RSV testing.  Fact Sheet for Patients: EntrepreneurPulse.com.au  Fact Sheet for Healthcare Providers: IncredibleEmployment.be  This test is not yet approved or cleared by the [place] FDA and has been authorized for detection and/or diagnosis of SARS-CoV-2 by FDA  under an Emergency Use Authorization (EUA). This EUA will remain in effect (meaning this test can be used) for the duration of the COVID-19 declaration under Section 564(b)(1) of the Act, 21 U.S.C. section 360bbb-3(b)(1), unless the authorization is terminated or revoked.  Performed at [facility], [place] [address] [place], [place] [address]      Labs: BNP (last 3 results) No results for input(s): BNP in the last 8760 hours. Basic Metabolic Panel: Recent Labs  Lab 02/01/21 1535 02/03/21 1018 02/06/21 1228 02/07/21 0251 02/07/21 1008  NA 131* 126* 125* 127* 127*  K 4.2 4.1 3.8 3.6 4.4  CL 96* 91* 89* 92* 93*  CO2 23 26 27 25 26   GLUCOSE 146* 144* 147* 112* 182*  BUN 15 20 25* 18 18  CREATININE 1.15* 1.27* 1.74* 1.37* 1.28*  CALCIUM 10.5*  10.5* 9.8 9.9 9.6 9.8   Liver Function Tests: Recent Labs  Lab 02/01/21 1535 02/06/21 1228  AST 16 28  ALT 13 28  ALKPHOS  --  135*  BILITOT 0.7 0.6  PROT 6.6 7.1  ALBUMIN  --  3.5   Recent Labs  Lab 02/06/21 1228  LIPASE 26   No results for input(s): AMMONIA in the last 168 hours. CBC: Recent Labs  Lab 02/01/21 1535 02/03/21 1018 02/06/21 1228 02/07/21 0251  WBC 9.1 7.8 5.7 4.6  NEUTROABS 7.4 6.5  --   --   HGB 13.2 12.3 11.6* 10.5*  HCT  38.1 35.0* 33.5* 31.2*  MCV 87.3 86.5 85.9 88.1  PLT 283.0 256.0 238 249   Cardiac Enzymes: No results for input(s): CKTOTAL, CKMB, CKMBINDEX, TROPONINI in the last 168 hours. BNP: Invalid input(s): POCBNP CBG: Recent Labs  Lab 02/06/21 1352 02/06/21 1755 02/06/21 2051 02/07/21 0753 02/07/21 1201  GLUCAP 116* 105* 139* 106* 121*   D-Dimer No results for input(s): DDIMER in the last 72 hours. Hgb A1c No results for input(s): HGBA1C in the last 72 hours. Lipid Profile No results for input(s): CHOL, HDL, LDLCALC, TRIG, CHOLHDL, LDLDIRECT in the last 72 hours. Thyroid function studies Recent Labs    02/07/21 0251  TSH 1.807   Anemia work up No results for input(s): VITAMINB12, FOLATE, FERRITIN, TIBC, IRON, RETICCTPCT in the last 72 hours. Urinalysis    Component Value Date/Time   COLORURINE YELLOW 02/06/2021 1500   APPEARANCEUR CLEAR 02/06/2021 1500   LABSPEC 1.011 02/06/2021 1500   PHURINE 6.0 02/06/2021 1500   GLUCOSEU NEGATIVE 02/06/2021 1500   HGBUR NEGATIVE 02/06/2021 1500   BILIRUBINUR NEGATIVE 02/06/2021 1500   BILIRUBINUR neg 02/03/2021 0956   KETONESUR NEGATIVE 02/06/2021 1500   PROTEINUR 100 (A) 02/06/2021 1500   UROBILINOGEN 0.2 02/03/2021 0956   UROBILINOGEN 1.0 09/14/2014 1614   NITRITE NEGATIVE 02/06/2021 1500   LEUKOCYTESUR NEGATIVE 02/06/2021 1500   Sepsis Labs Invalid input(s): PROCALCITONIN,  WBC,  LACTICIDVEN Microbiology Recent Results (from the past 240 hour(s))  Urine Culture     Status: Abnormal   Collection Time: 02/01/21  3:35 PM   Specimen: Urine  Result Value Ref Range Status   MICRO NUMBER: [id number]  Final   SPECIMEN QUALITY: Adequate  Final   Sample Source NOT GIVEN  Final   STATUS: FINAL  Final   ISOLATE 1: Enterococcus faecium (A)  Final    Comment: Greater than 100,000 CFU/mL of Enterococcus faecium      Susceptibility   Enterococcus faecium - URINE CULTURE POSITIVE 1    AMPICILLIN <=2 Sensitive     VANCOMYCIN <=0.5  Sensitive     NITROFURANTOIN* 32 Sensitive      * Legend:S = Susceptible  I = IntermediateR = Resistant  NS = Not susceptible* = Not tested  NR = Not reported**NN = See antimicrobic comments  Urine Culture     Status: Abnormal   Collection Time: 02/03/21 10:18 AM   Specimen: Blood  Result Value Ref Range Status   MICRO NUMBER: [id number]  Final   SPECIMEN QUALITY: Adequate  Final   Sample Source NOT GIVEN  Final   STATUS: FINAL  Final   ISOLATE 1: Enterococcus faecium (A)  Final    Comment: Greater than 100,000 CFU/mL of Enterococcus faecium   COMMENT:   Final    Additional non-predominating organism(s) isolated. These organisms, commonly found on external and internal genitalia, are considered colonizers. No further testing performed.      Susceptibility   Enterococcus faecium - URINE CULTURE POSITIVE 1    AMPICILLIN <=2 Sensitive     VANCOMYCIN <=0.5 Sensitive     NITROFURANTOIN* 32 Sensitive      * Legend:S = Susceptible  I = IntermediateR = Resistant  NS = Not susceptible* = Not tested  NR = Not reported**NN = See antimicrobic comments  Resp Panel by RT-PCR (Flu A&B, Covid) Urine, Clean Catch     Status: None   Collection Time: 02/06/21  2:03 PM   Specimen: Urine, Clean Catch; Nasopharyngeal(NP) swabs in vial transport medium  Result Value Ref Range Status   SARS Coronavirus 2 by RT PCR NEGATIVE NEGATIVE Final    Comment: (NOTE) SARS-CoV-2 target nucleic acids are NOT DETECTED.  The SARS-CoV-2 RNA is generally detectable in upper respiratory specimens during the acute phase of infection. The lowest concentration of SARS-CoV-2 viral copies this assay can detect is 138 copies/mL. A negative result does not preclude SARS-Cov-2 infection and should not be used as the sole basis for treatment or other patient management decisions. A negative result may occur with  improper specimen collection/handling, submission of specimen other than nasopharyngeal swab, presence of viral  mutation(s) within the areas targeted by this assay, and inadequate number of viral copies(<138 copies/mL). A negative result must be combined with clinical observations, patient history, and epidemiological information. The expected result is Negative.  Fact Sheet for Patients:  EntrepreneurPulse.com.au  Fact Sheet for Healthcare Providers:  IncredibleEmployment.be  This test is no t yet approved or cleared by the [place] FDA and  has been authorized for detection and/or diagnosis of SARS-CoV-2 by FDA under an Emergency Use Authorization (EUA). This EUA will remain  in effect (meaning this test can be used) for the duration of the COVID-19 declaration under Section 564(b)(1) of the Act, 21 U.S.C.section 360bbb-3(b)(1), unless the authorization is terminated  or revoked sooner.       Influenza A by PCR NEGATIVE NEGATIVE Final   Influenza B by PCR NEGATIVE NEGATIVE Final    Comment: (NOTE) The Xpert Xpress SARS-CoV-2/FLU/RSV plus assay is intended as an aid in the diagnosis of influenza from Nasopharyngeal swab specimens and should not be used as a sole basis for treatment. Nasal washings and aspirates are unacceptable for Xpert Xpress SARS-CoV-2/FLU/RSV testing.  Fact Sheet for Patients: EntrepreneurPulse.com.au  Fact Sheet for Healthcare Providers: IncredibleEmployment.be  This test is not yet approved or cleared by the [place] FDA and has been authorized for detection and/or diagnosis of SARS-CoV-2 by FDA under an Emergency Use Authorization (EUA). This EUA will remain in effect (meaning this test can be used) for  the duration of the COVID-19 declaration under Section 564(b)(1) of the Act, 21 U.S.C. section 360bbb-3(b)(1), unless the authorization is terminated or revoked.  Performed at [facility], [place] [address], [place], [place] [address]      Patient was seen  and examined on the day of discharge and was found to be in stable condition. Time coordinating discharge: 40 minutes including assessment and coordination of care, as well as examination of the patient.   SIGNED:  Dessa Phi, DO [facility] 02/07/2021, 3:39 PM

## 2021-02-07 NOTE — Evaluation (Signed)
Physical Therapy Evaluation Patient Details Name: Jessica Curry MRN: [id number] DOB: 14-Sep-1951 Today's Date: 02/07/2021   History of Present Illness  From H&P by Dr. Neysa Bonito: "This is a 70 year old female with past medical history of diabetes, hypertension, hyperlipidemia who was sent to the ED for evaluation of hyponatremia and UTI by her PCP  Clinical Impression  The  Patient ambulated x 400' withoout overt balance loss. Patient wants to Dc home. Her mother is at home with her. No further PT indicated.    Follow Up Recommendations No PT follow up    Equipment Recommendations  None recommended by PT    Recommendations for Other Services       Precautions / Restrictions Precautions Precautions: Fall      Mobility  Bed Mobility Overal bed mobility: Independent                  Transfers Overall transfer level: Independent                  Ambulation/Gait Ambulation/Gait assistance: Supervision Gait Distance (Feet): 400 Feet Assistive device: None Gait Pattern/deviations: Step-through pattern   Gait velocity interpretation: 1.31 - 2.62 ft/sec, indicative of limited community Financial controller    Modified Rankin (Stroke Patients Only)       Balance Overall balance assessment: Mild deficits observed, not formally tested                                           Pertinent Vitals/Pain Pain Assessment: No/denies pain    Home Living Family/patient expects to be discharged to:: Private residence Living Arrangements: Parent Available Help at Discharge: Family;Available PRN/intermittently Type of Home: House Home Access: Stairs to enter Entrance Stairs-Rails: Right Entrance Stairs-Number of Steps: 6 Home Layout: Two level;Able to live on main level with bedroom/bathroom Home Equipment: None      Prior Function Level of Independence: Independent               Hand Dominance         Extremity/Trunk Assessment   Upper Extremity Assessment Upper Extremity Assessment: Overall WFL for tasks assessed    Lower Extremity Assessment Lower Extremity Assessment: Overall WFL for tasks assessed    Cervical / Trunk Assessment Cervical / Trunk Assessment: Normal  Communication   Communication: No difficulties  Cognition Arousal/Alertness: Awake/alert Behavior During Therapy: WFL for tasks assessed/performed;Impulsive Overall Cognitive Status: Within Functional Limits for tasks assessed                                        General Comments      Exercises     Assessment/Plan    PT Assessment Patent does not need any further PT services  PT Problem List         PT Treatment Interventions      PT Goals (Current goals can be found in the Care Plan section)  Acute Rehab PT Goals Patient Stated Goal: go home PT Goal Formulation: All assessment and education complete, DC therapy    Frequency     Barriers to discharge        Co-evaluation  AM-PAC PT "6 Clicks" Mobility  Outcome Measure Help needed turning from your back to your side while in a flat bed without using bedrails?: None Help needed moving from lying on your back to sitting on the side of a flat bed without using bedrails?: None Help needed moving to and from a bed to a chair (including a wheelchair)?: None Help needed standing up from a chair using your arms (e.g., wheelchair or bedside chair)?: None Help needed to walk in hospital room?: None Help needed climbing 3-5 steps with a railing? : None 6 Click Score: 24    End of Session Equipment Utilized During Treatment: Gait belt Activity Tolerance: Patient tolerated treatment well Patient left: in chair;with chair alarm set Nurse Communication: Mobility status PT Visit Diagnosis: Difficulty in walking, not elsewhere classified (R26.2)    Time: 1415-1430 PT Time Calculation (min) (ACUTE ONLY): 15  min   Charges:   PT Evaluation $PT Eval Low Complexity: [employer] Pager [phone] Office [phone]   Claretha Cooper 02/07/2021, 4:06 PM

## 2021-02-15 ENCOUNTER — Encounter: Payer: Self-pay | Admitting: Family Medicine

## 2021-02-15 ENCOUNTER — Other Ambulatory Visit: Payer: Self-pay

## 2021-02-15 ENCOUNTER — Ambulatory Visit (INDEPENDENT_AMBULATORY_CARE_PROVIDER_SITE_OTHER): Payer: Medicare Other | Admitting: Family Medicine

## 2021-02-15 VITALS — BP 154/90 | HR 65 | Temp 97.5°F | Wt 194.2 lb

## 2021-02-15 DIAGNOSIS — N390 Urinary tract infection, site not specified: Secondary | ICD-10-CM | POA: Diagnosis not present

## 2021-02-15 DIAGNOSIS — N281 Cyst of kidney, acquired: Secondary | ICD-10-CM | POA: Diagnosis not present

## 2021-02-15 DIAGNOSIS — N179 Acute kidney failure, unspecified: Secondary | ICD-10-CM

## 2021-02-15 DIAGNOSIS — E1159 Type 2 diabetes mellitus with other circulatory complications: Secondary | ICD-10-CM | POA: Diagnosis not present

## 2021-02-15 DIAGNOSIS — E860 Dehydration: Secondary | ICD-10-CM | POA: Diagnosis not present

## 2021-02-15 DIAGNOSIS — I152 Hypertension secondary to endocrine disorders: Secondary | ICD-10-CM

## 2021-02-15 DIAGNOSIS — E871 Hypo-osmolality and hyponatremia: Secondary | ICD-10-CM

## 2021-02-15 NOTE — Progress Notes (Signed)
Subjective  CC:  Chief Complaint  Patient presents with  . Hospitalization Follow-up    Low sodium levels, kidney lesion discovered. Hospitalist wants to stop lisinopril-hctz    HPI: Jessica Curry is a 70 y.o. female who presents to the office today to address the problems listed above in the chief complaint.  Hospital f/u: reviewed recent hospital records and dc summary. Pt is doing much much better. No longer lethargic, weak or confused. Eating and drinking normally. No further UTI sxs. Treated for entercoccus UTI, dehydration w/ associated AKI, hyponatremia and weakness. Has HTN - zestoretic was held due to low sodium levels. She is almost back to her normal self.  CT scan showed exophytic renal mass; likely cystic but not clear. Needs further eval. Has h/o renal cyst.    Assessment  1. Hyponatremia   2. Acute cystitis with hematuria   3. AKI (acute kidney injury) ([place])   4. Renal cyst   5. Dehydration, moderate   6. Hypertension associated with diabetes ([place])      Plan   Low sodium due to hctz and exacerbated by dehydration:  Recheck today off hctz. If normalizes, and AKI resolved, rec restart lisinopril only. Pt educated.   HTN: elevated due to above. Continue clonidine tid. Will add 2nd agent after lab work returns.   AKI, acute on chronic, prerenal. Recheck today  Renal ultrasound to further evaluate cyst   Follow up: as scheduled.  06/13/2021  Orders Placed This Encounter  Procedures  . US Renal  . Basic metabolic panel   No orders of the defined types were placed in this encounter.     I reviewed the patients updated PMH, FH, and SocHx.    Patient Active Problem List   Diagnosis Date Noted  . History of heart artery stent 02/10/2020    Priority: High  . Simple chronic bronchitis ([place]) 02/11/2018    Priority: High  . Hyperlipidemia associated with type 2 diabetes mellitus ([place]) 06/04/2017    Priority: High  . Diabetes mellitus type II, controlled  ([place]) 09/10/2016    Priority: High  . CAD (coronary artery disease), native coronary artery 09/10/2016    Priority: High  . Hypertension associated with diabetes ([place]) 09/10/2016    Priority: High  . Smoker 09/10/2016    Priority: High  . Depression, major, recurrent, in partial remission ([place])     Priority: High  . Chronic midline low back pain without sciatica 12/26/2020    Priority: Medium  . Serrated polyp of colon 08/12/2018    Priority: Medium  . Osteopenia 06/10/2017    Priority: Medium  . Mild intermittent asthma 08/06/2019    Priority: Low  . AKI (acute kidney injury) ([place]) 02/07/2021  . Alcohol use 02/07/2021  . Chronic kidney disease (CKD) stage G3a/A1, moderately decreased glomerular filtration rate (GFR) between 45-59 mL/min/1.73 square meter and albuminuria creatinine ratio less than 30 mg/g ([facility]) 02/07/2021  . Chronic hyponatremia 02/07/2021  . UTI (urinary tract infection) 02/06/2021  . Generalized weakness 02/06/2021  . Renal cyst, left 02/06/2021  . Hyponatremia 01/02/2021  . Hypercalcemia 01/02/2021  . Encounter for screening for lung cancer 02/10/2020   Current Meds  Medication Sig  . albuterol (PROAIR HFA) 108 (90 Base) MCG/ACT inhaler Inhale 2 puffs into the lungs every 6 (six) hours as needed for wheezing or shortness of breath.  . ALPRAZolam (XANAX) 0.5 MG tablet Take 1 tablet (0.5 mg total) by mouth daily as needed for anxiety. (Patient taking differently:  Take 0.25-0.5 mg by mouth daily as needed for anxiety.)  . aspirin 81 MG tablet Take 81 mg by mouth at bedtime.  . cloNIDine (CATAPRES) 0.1 MG tablet Take 1 tablet (0.1 mg total) by mouth 3 (three) times daily.  . diclofenac (VOLTAREN) 75 MG EC tablet Take 1 tablet (75 mg total) by mouth 2 (two) times daily as needed. (Patient taking differently: Take 75 mg by mouth 2 (two) times daily as needed for mild pain.)  . diphenhydrAMINE (BENADRYL) 25 MG tablet Take 25 mg by mouth every 6 (six) hours as needed  for allergies or sleep.  . Ibuprofen-Acetaminophen 125-250 MG TABS Take 2 tablets by mouth every 6 (six) hours as needed (pain).  . metFORMIN (GLUCOPHAGE) 1000 MG tablet TAKE 1 TABLET BY MOUTH  TWICE DAILY WITH MEALS  . Multiple Vitamins-Minerals (MULTIVITAMIN WITH MINERALS) tablet Take 1 tablet by mouth at bedtime. Centrum silver for women  . nitrofurantoin, macrocrystal-monohydrate, (MACROBID) 100 MG capsule Take 1 capsule (100 mg total) by mouth 2 (two) times daily.  . rosuvastatin (CRESTOR) 20 MG tablet Take 1 tablet (20 mg total) by mouth daily.  Marland Kitchen venlafaxine XR (EFFEXOR-XR) 75 MG 24 hr capsule TAKE THREE CAPSULES BY MOUTH DAILY WITH BREAKFAST (Patient taking differently: Take 225 mg by mouth daily with breakfast.)    Allergies: Patient has No Known Allergies. Family History: Patient family history includes AAA (abdominal aortic aneurysm) in her mother; Arthritis in her brother and mother; Dementia in her maternal grandmother; Heart attack in her brother; Hyperlipidemia in her brother; Hypertension in her brother; Hypothyroidism in her mother; Kidney disease in her mother; Other in her father; Ovarian cancer in her mother; Stroke in her brother. Social History:  Patient  reports that she has been smoking cigarettes. She has a 14.40 pack-year smoking history. She has never used smokeless tobacco. She reports current alcohol use of about 4.0 standard drinks of alcohol per week. She reports that she does not use drugs.  Review of Systems: Constitutional: Negative for fever malaise or anorexia Cardiovascular: negative for chest pain Respiratory: negative for SOB or persistent cough Gastrointestinal: negative for abdominal pain  Objective  Vitals: BP (!) 154/90   Pulse 65   Temp (!) 97.5 F (36.4 C) (Temporal)   Wt 194 lb 3.2 oz (88.1 kg)   LMP  (LMP Unknown)   SpO2 98%   BMI 29.53 kg/m  General: no acute distress , A&Ox3, looks better! HEENT: PEERL, conjunctiva normal, neck is  supple Cardiovascular:  RRR without murmur or gallop.  Respiratory:  Good breath sounds bilaterally, CTAB with normal respiratory effort Gastrointestinal: soft, flat abdomen, normal active bowel sounds, no palpable masses, no hepatosplenomegaly, no appreciated hernias, no suprabpubic or cvat ttp. Skin:  Warm, no rashes    Commons side effects, risks, benefits, and alternatives for medications and treatment plan prescribed today were discussed, and the patient expressed understanding of the given instructions. Patient is instructed to call or message via MyChart if he/she has any questions or concerns regarding our treatment plan. No barriers to understanding were identified. We discussed Red Flag symptoms and signs in detail. Patient expressed understanding regarding what to do in case of urgent or emergency type symptoms.   Medication list was reconciled, printed and provided to the patient in AVS. Patient instructions and summary information was reviewed with the patient as documented in the AVS. This note was prepared with assistance of Dragon voice recognition software. Occasional wrong-word or sound-a-like substitutions may have occurred due to  the inherent limitations of voice recognition software  This visit occurred during the SARS-CoV-2 public health emergency.  Safety protocols were in place, including screening questions prior to the visit, additional usage of staff PPE, and extensive cleaning of exam room while observing appropriate contact time as indicated for disinfecting solutions.

## 2021-02-15 NOTE — Patient Instructions (Signed)
Follow up in 3 months for blood pressure recheck.   I will let you know what medication to take after your labwork returns.   Glad you are feeling better.   We will call you to schedule your renal ultrasound to follow up on the cyst.

## 2021-02-16 ENCOUNTER — Other Ambulatory Visit: Payer: Self-pay

## 2021-02-16 LAB — BASIC METABOLIC PANEL
BUN: 19 mg/dL (ref 6–23)
CO2: 26 mEq/L (ref 19–32)
Calcium: 10.5 mg/dL (ref 8.4–10.5)
Chloride: 100 mEq/L (ref 96–112)
Creatinine, Ser: 1.08 mg/dL (ref 0.40–1.20)
GFR: 52.37 mL/min — ABNORMAL LOW (ref >60.00)
Glucose, Bld: 86 mg/dL (ref 70–99)
Potassium: 4.9 mEq/L (ref 3.5–5.1)
Sodium: 134 mEq/L — ABNORMAL LOW (ref 135–145)

## 2021-02-16 MED ORDER — LISINOPRIL 40 MG PO TABS: 40.0000 mg | ORAL_TABLET | Freq: Every day | ORAL | 3 refills | Status: DC

## 2021-02-16 NOTE — Progress Notes (Signed)
Please call patient: I have reviewed his/her lab results. Sodium and kidney function have improved.  Please order lisinopril 40mg  po daily #90 [id number]. This will replace her lisinopril hct prescription that was stopped.  Thanks.

## 2021-02-23 ENCOUNTER — Telehealth: Payer: Self-pay

## 2021-02-23 NOTE — Telephone Encounter (Signed)
Pt states she is not taking the new cholesterol medication.

## 2021-02-23 NOTE — Telephone Encounter (Signed)
FYI

## 2021-02-27 ENCOUNTER — Other Ambulatory Visit: Payer: Medicare Other

## 2021-02-28 NOTE — Telephone Encounter (Signed)
Please call to clarify why?? Should be on crestor.  Thanks.

## 2021-02-28 NOTE — Telephone Encounter (Signed)
Patient is experiencing muscle and joint aches. States that she is taking atorvastatin but not the rouvastatin.

## 2021-03-06 ENCOUNTER — Other Ambulatory Visit: Payer: Medicare Other

## 2021-03-06 DIAGNOSIS — H2513 Age-related nuclear cataract, bilateral: Secondary | ICD-10-CM | POA: Diagnosis not present

## 2021-03-06 DIAGNOSIS — H524 Presbyopia: Secondary | ICD-10-CM | POA: Diagnosis not present

## 2021-03-06 LAB — HM DIABETES EYE EXAM

## 2021-03-07 ENCOUNTER — Encounter: Payer: Self-pay | Admitting: Family Medicine

## 2021-03-07 ENCOUNTER — Ambulatory Visit
Admission: RE | Admit: 2021-03-07 | Discharge: 2021-03-07 | Disposition: A | Discharge status: Home / Self Care | Payer: Medicare Other | Source: Ambulatory Visit | Attending: Family Medicine | Admitting: Family Medicine

## 2021-03-07 DIAGNOSIS — N281 Cyst of kidney, acquired: Secondary | ICD-10-CM

## 2021-03-07 DIAGNOSIS — N179 Acute kidney failure, unspecified: Secondary | ICD-10-CM

## 2021-03-08 ENCOUNTER — Telehealth: Payer: Self-pay

## 2021-03-08 NOTE — Telephone Encounter (Signed)
Patient is calling in wondering if her [place] results are in, was told they would be back and read within 24 hours.

## 2021-03-09 ENCOUNTER — Other Ambulatory Visit: Payer: Self-pay

## 2021-03-09 ENCOUNTER — Encounter: Payer: Self-pay | Admitting: Family Medicine

## 2021-03-09 DIAGNOSIS — N281 Cyst of kidney, acquired: Secondary | ICD-10-CM

## 2021-03-09 NOTE — Telephone Encounter (Signed)
Please advise 

## 2021-03-09 NOTE — Progress Notes (Signed)
Please call patient: I have reviewed his/her lab results. The kidney ultrasound shows the left renal cyst that was mentioned on the abd ct: appears benign. Also see a right renal cyst; need to repeat in 6 months to ensure stability. Please notify patient and order ultrasound for bilateral renal cysts in 6 months. Thnks.

## 2021-03-09 NOTE — Telephone Encounter (Signed)
See ultrasound result note.

## 2021-04-10 ENCOUNTER — Other Ambulatory Visit (HOSPITAL_BASED_OUTPATIENT_CLINIC_OR_DEPARTMENT_OTHER): Payer: Self-pay

## 2021-04-10 ENCOUNTER — Ambulatory Visit: Payer: Medicare Other | Attending: Internal Medicine

## 2021-04-10 ENCOUNTER — Other Ambulatory Visit: Payer: Self-pay

## 2021-04-10 DIAGNOSIS — Z23 Encounter for immunization: Secondary | ICD-10-CM

## 2021-04-10 MED ORDER — PFIZER-BIONT COVID-19 VAC-TRIS 30 MCG/0.3ML IM SUSP
INTRAMUSCULAR | 0 refills | Status: DC
  Filled 2021-04-10: qty 0.3, 1d supply, fill #0

## 2021-04-10 NOTE — Progress Notes (Signed)
   Covid-19 Vaccination Clinic  Name:  Jessica Curry    MRN: [id number] DOB: [birth year]  04/10/2021  Jessica Curry was observed post Covid-19 immunization for 15 minutes without incident. She was provided with Vaccine Information Sheet and instruction to access the V-Safe system.   Jessica Curry was instructed to call 911 with any severe reactions post vaccine: Marland Kitchen Difficulty breathing  . Swelling of face and throat  . A fast heartbeat  . A bad rash all over body  . Dizziness and weakness   Immunizations Administered    Name Date Dose VIS Date Route   PFIZER Comrnaty(Gray TOP) Covid-19 Vaccine 04/10/2021 10:58 AM 0.3 mL 11/03/2020 Intramuscular   Manufacturer: [employer]   Lot: [id number]   NDC: [phone]

## 2021-04-25 ENCOUNTER — Other Ambulatory Visit: Payer: Self-pay | Admitting: Family Medicine

## 2021-04-28 DIAGNOSIS — M25561 Pain in right knee: Secondary | ICD-10-CM | POA: Diagnosis not present

## 2021-04-28 DIAGNOSIS — M25562 Pain in left knee: Secondary | ICD-10-CM | POA: Diagnosis not present

## 2021-05-01 ENCOUNTER — Encounter: Payer: Self-pay | Admitting: Family Medicine

## 2021-05-01 ENCOUNTER — Other Ambulatory Visit: Payer: Self-pay

## 2021-05-01 ENCOUNTER — Ambulatory Visit (INDEPENDENT_AMBULATORY_CARE_PROVIDER_SITE_OTHER): Payer: Medicare Other | Admitting: Family Medicine

## 2021-05-01 VITALS — BP 170/98 | HR 59 | Temp 97.9°F | Wt 197.4 lb

## 2021-05-01 DIAGNOSIS — G8929 Other chronic pain: Secondary | ICD-10-CM | POA: Diagnosis not present

## 2021-05-01 DIAGNOSIS — I152 Hypertension secondary to endocrine disorders: Secondary | ICD-10-CM | POA: Diagnosis not present

## 2021-05-01 DIAGNOSIS — E1159 Type 2 diabetes mellitus with other circulatory complications: Secondary | ICD-10-CM

## 2021-05-01 DIAGNOSIS — M545 Low back pain, unspecified: Secondary | ICD-10-CM | POA: Diagnosis not present

## 2021-05-01 DIAGNOSIS — R300 Dysuria: Secondary | ICD-10-CM

## 2021-05-01 LAB — POCT URINALYSIS DIPSTICK
Bilirubin, UA: NEGATIVE
Glucose, UA: NEGATIVE
Ketones, UA: NEGATIVE
Nitrite, UA: NEGATIVE
Protein, UA: POSITIVE — AB
Spec Grav, UA: 1.025 (ref 1.010–1.025)
Urobilinogen, UA: 0.2 E.U./dL
pH, UA: 6 (ref 5.0–8.0)

## 2021-05-01 MED ORDER — MELOXICAM 15 MG PO TABS: 15.0000 mg | ORAL_TABLET | Freq: Every day | ORAL | 3 refills | Status: DC

## 2021-05-01 MED ORDER — AMOXICILLIN 875 MG PO TABS: 875.0000 mg | ORAL_TABLET | Freq: Two times a day (BID) | ORAL | 0 refills | Status: AC

## 2021-05-01 NOTE — Patient Instructions (Signed)
Please return in 6 weeks to recheck blood pressure.   I have refilled the meloxicam for your back pain. You may take it once a day; however you should not take ibuprofen, alleve, advil OR diclofenac with it. This can be damaging to your kidneys and stomach.   If you have any questions or concerns, please don't hesitate to send me a message via MyChart or call the office at [phone]. Thank you for visiting with Jessica Curry today! It's our pleasure caring for you.

## 2021-05-01 NOTE — Progress Notes (Signed)
Subjective   CC:  Chief Complaint  Patient presents with   Urinary Tract Infection    Urgency started two weeks ago, pain at the end of urination started this morning. Did start taking cranberry pills a few weeks ago. Has noticed she goes more often but less quantity   Back Pain    Thinks to be related to kidney, requesting mobic to be refilled - helps with pain     HPI: Jessica Curry is a 70 y.o. female who presents to the office today to address the problems listed above in the chief complaint. Patient reports dysuria and urinary frequency.  Symptoms started about 2 weeks ago but have worsened over the last few days.  No fevers or chills but she does have low back pain.  This is most likely her mechanical low back pain and she request noted.  She is no longer using Voltaren.  She denies malaise or lack of appetite.  No gross hematuria.  She does have a history of urosepsis. Chronic low back pain: As noted above.  Active.  She feels Mobic works best.  No radicular symptoms. Hypertension: Has been very well controlled.  Very elevated today but patient is in pain.  She has not taken her blood pressure medication yet.  She denies chest pain or shortness of breath  Assessment  1. Dysuria   2. Hypertension associated with diabetes ([place])   3. Chronic midline low back pain without sciatica      Plan  Possible acute cystitis.  Will treat empirically given history of urosepsis.  Await urine culture.  She is currently nontoxic.  No signs or symptoms of pyelonephritis at this time.  Patient knows if symptoms worsen she will get rechecked. Hypertension with elevated reading today, possibly pain related.  Patient to start checking again at home. Chronic low back pain: Start Mobic.  Stop all other NSAIDs.  Education given.  Follow up: As needed Orders Placed This Encounter  Procedures   Urine Culture   POCT Urinalysis Dipstick   Meds ordered this encounter  Medications   meloxicam (MOBIC)  15 MG tablet    Sig: Take 1 tablet (15 mg total) by mouth daily.    Dispense:  90 tablet    Refill:  3   amoxicillin (AMOXIL) 875 MG tablet    Sig: Take 1 tablet (875 mg total) by mouth 2 (two) times daily for 7 days.    Dispense:  14 tablet    Refill:  0      I reviewed the patients updated PMH, FH, and SocHx.    Patient Active Problem List   Diagnosis Date Noted   History of heart artery stent 02/10/2020    Priority: High   Simple chronic bronchitis ([place]) 02/11/2018    Priority: High   Hyperlipidemia associated with type 2 diabetes mellitus ([place]) 06/04/2017    Priority: High   Diabetes mellitus type II, controlled ([place]) 09/10/2016    Priority: High   CAD (coronary artery disease), native coronary artery 09/10/2016    Priority: High   Hypertension associated with diabetes ([place]) 09/10/2016    Priority: High   Smoker 09/10/2016    Priority: High   Depression, major, recurrent, in partial remission ([place])     Priority: High   Chronic hyponatremia 02/07/2021    Priority: Medium   Bilateral renal cysts 02/06/2021    Priority: Medium   Chronic midline low back pain without sciatica 12/26/2020    Priority: Medium  Serrated polyp of colon 08/12/2018    Priority: Medium   Osteopenia 06/10/2017    Priority: Medium   Encounter for screening for lung cancer 02/10/2020    Priority: Low   Mild intermittent asthma 08/06/2019    Priority: Low   Alcohol use 02/07/2021   Chronic kidney disease (CKD) stage G3a/A1, moderately decreased glomerular filtration rate (GFR) between 45-59 mL/min/1.73 square meter and albuminuria creatinine ratio less than 30 mg/g ([facility]) 02/07/2021   Current Meds  Medication Sig   albuterol (PROAIR HFA) 108 (90 Base) MCG/ACT inhaler Inhale 2 puffs into the lungs every 6 (six) hours as needed for wheezing or shortness of breath.   ALPRAZolam (XANAX) 0.5 MG tablet Take 1 tablet (0.5 mg total) by mouth daily as needed for anxiety. (Patient taking differently:  Take 0.25-0.5 mg by mouth daily as needed for anxiety.)   amoxicillin (AMOXIL) 875 MG tablet Take 1 tablet (875 mg total) by mouth 2 (two) times daily for 7 days.   aspirin 81 MG tablet Take 81 mg by mouth at bedtime.   cloNIDine (CATAPRES) 0.1 MG tablet Take 1 tablet (0.1 mg total) by mouth 3 (three) times daily.   COVID-19 mRNA Vac-TriS, Pfizer, (PFIZER-BIONT COVID-19 VAC-TRIS) SUSP injection Inject into the muscle.   diphenhydrAMINE (BENADRYL) 25 MG tablet Take 25 mg by mouth every 6 (six) hours as needed for allergies or sleep.   lisinopril (ZESTRIL) 40 MG tablet Take 1 tablet (40 mg total) by mouth daily.   metFORMIN (GLUCOPHAGE) 1000 MG tablet TAKE 1 TABLET BY MOUTH  TWICE DAILY WITH MEALS   Multiple Vitamins-Minerals (MULTIVITAMIN WITH MINERALS) tablet Take 1 tablet by mouth at bedtime. Centrum silver for women   rosuvastatin (CRESTOR) 20 MG tablet Take 1 tablet (20 mg total) by mouth daily.   venlafaxine XR (EFFEXOR-XR) 75 MG 24 hr capsule TAKE THREE CAPSULES BY MOUTH DAILY WITH BREAKFAST   [DISCONTINUED] diclofenac (VOLTAREN) 75 MG EC tablet Take 1 tablet (75 mg total) by mouth 2 (two) times daily as needed. (Patient taking differently: Take 75 mg by mouth 2 (two) times daily as needed for mild pain.)   [DISCONTINUED] Ibuprofen-Acetaminophen 125-250 MG TABS Take 2 tablets by mouth every 6 (six) hours as needed (pain).   [DISCONTINUED] nitrofurantoin, macrocrystal-monohydrate, (MACROBID) 100 MG capsule Take 1 capsule (100 mg total) by mouth 2 (two) times daily.    Review of Systems: Cardiovascular: negative for chest pain Respiratory: negative for SOB or persistent cough Gastrointestinal: negative for abdominal pain Constitutional: Negative for fever malaise or anorexia  Objective  Vitals: BP (!) 170/98   Pulse (!) 59   Temp 97.9 F (36.6 C) (Temporal)   Wt 197 lb 6.4 oz (89.5 kg)   LMP  (LMP Unknown)   SpO2 97%   BMI 30.01 kg/m  General: no acute distress, nontoxic  appearing Psych:  Alert and oriented, normal mood and affect Cardiovascular:  RRR without murmur or gallop. no peripheral edema Respiratory:  Good breath sounds bilaterally, CTAB with normal respiratory effort Gastrointestinal: soft, flat abdomen, normal active bowel sounds, no palpable masses, no hepatosplenomegaly, no appreciated hernias, NO CVAT, mild suprapubic ttp w/o rebound or guarding Skin:  Warm, no rashes Neurologic:   Mental status is normal. normal gait Office Visit on 05/01/2021  Component Date Value Ref Range Status   Color, UA 05/01/2021 yellow   Final   Clarity, UA 05/01/2021 cloudy   Final   Glucose, UA 05/01/2021 Negative  Negative Final   Bilirubin, UA 05/01/2021 negative  Final   Ketones, UA 05/01/2021 negative   Final   Spec Grav, UA 05/01/2021 1.025  1.010 - 1.025 Final   Blood, UA 05/01/2021 3+   Final   pH, UA 05/01/2021 6.0  5.0 - 8.0 Final   Protein, UA 05/01/2021 Positive (A) Negative Final   Urobilinogen, UA 05/01/2021 0.2  0.2 or 1.0 E.U./dL Final   Nitrite, UA 05/01/2021 negative   Final   Leukocytes, UA 05/01/2021 Small (1+) (A) Negative Final   MICRO NUMBER: 05/01/2021 [id number]   Final   SPECIMEN QUALITY: 05/01/2021 Adequate   Final   Sample Source 05/01/2021 NOT GIVEN   Final   STATUS: 05/01/2021 FINAL   Final   ISOLATE 1: 05/01/2021 Escherichia coli (A)  Final   Commons side effects, risks, benefits, and alternatives for medications and treatment plan prescribed today were discussed, and the patient expressed understanding of the given instructions. Patient is instructed to call or message via MyChart if he/she has any questions or concerns regarding our treatment plan. No barriers to understanding were identified. We discussed Red Flag symptoms and signs in detail. Patient expressed understanding regarding what to do in case of urgent or emergency type symptoms.  Medication list was reconciled, printed and provided to the patient in AVS. Patient  instructions and summary information was reviewed with the patient as documented in the AVS. This note was prepared with assistance of Dragon voice recognition software. Occasional wrong-word or sound-a-like substitutions may have occurred due to the inherent limitations of voice recognition software

## 2021-05-03 LAB — URINE CULTURE
MICRO NUMBER:: 11972859
SPECIMEN QUALITY:: ADEQUATE

## 2021-05-03 NOTE — Progress Notes (Signed)
+   UTI sensitive to abx used. No further action needed

## 2021-05-08 ENCOUNTER — Telehealth: Payer: Self-pay

## 2021-05-08 NOTE — Telephone Encounter (Signed)
Patient states that she is having difficulty moving, in a lot of pain. States that her urine is still cloudy, is forcing water. Wanting to know what she needs to do. Please advise

## 2021-05-08 NOTE — Telephone Encounter (Signed)
Patient's mother aware to notify patient to return call

## 2021-05-09 ENCOUNTER — Telehealth: Payer: Self-pay

## 2021-05-09 NOTE — Telephone Encounter (Signed)
Patient scheduled in the morning. Again advised to be seen at [facility] or ER, states she doesn't have the extra money

## 2021-05-09 NOTE — Telephone Encounter (Signed)
Error

## 2021-05-09 NOTE — Telephone Encounter (Signed)
Patient called in stating that she took the round of antibiotics, but started having sweats, chills and feels like she is unable to get up and move around. Ebony Hail spoke with Joellen Jersey who advised to go to ED or urgent care due to her symptoms.

## 2021-05-09 NOTE — Telephone Encounter (Signed)
Advised patient that given with previous hospital admit for similar symptoms that she needs to be seen at either UC or ER today. Wanted to know when PCP will be back in office, I told her tomorrow. States she is just going to go back to bed and sleep until Dr. Jonni Sanger returns tomorrow. Advised highly against this, but patient was adamant. Marland Kitchen

## 2021-05-10 ENCOUNTER — Observation Stay (HOSPITAL_COMMUNITY): Payer: Medicare Other

## 2021-05-10 ENCOUNTER — Encounter (HOSPITAL_COMMUNITY): Payer: Self-pay | Admitting: *Deleted

## 2021-05-10 ENCOUNTER — Inpatient Hospital Stay (HOSPITAL_COMMUNITY)
Admission: EM | Admit: 2021-05-10 | Discharge: 2021-05-12 | DRG: 871 | Disposition: A | Discharge status: Home / Self Care | Payer: Medicare Other | Attending: Internal Medicine | Admitting: Internal Medicine

## 2021-05-10 ENCOUNTER — Ambulatory Visit (INDEPENDENT_AMBULATORY_CARE_PROVIDER_SITE_OTHER): Payer: Medicare Other | Admitting: Family Medicine

## 2021-05-10 ENCOUNTER — Emergency Department (HOSPITAL_COMMUNITY): Payer: Medicare Other

## 2021-05-10 ENCOUNTER — Other Ambulatory Visit: Payer: Self-pay

## 2021-05-10 ENCOUNTER — Encounter: Payer: Self-pay | Admitting: Family Medicine

## 2021-05-10 VITALS — BP 93/57 | HR 62 | Temp 98.7°F | Ht 67.0 in | Wt 191.8 lb

## 2021-05-10 DIAGNOSIS — Z8619 Personal history of other infectious and parasitic diseases: Secondary | ICD-10-CM | POA: Diagnosis not present

## 2021-05-10 DIAGNOSIS — I2510 Atherosclerotic heart disease of native coronary artery without angina pectoris: Secondary | ICD-10-CM | POA: Diagnosis present

## 2021-05-10 DIAGNOSIS — Z87440 Personal history of urinary (tract) infections: Secondary | ICD-10-CM

## 2021-05-10 DIAGNOSIS — Z83438 Family history of other disorder of lipoprotein metabolism and other lipidemia: Secondary | ICD-10-CM

## 2021-05-10 DIAGNOSIS — K8020 Calculus of gallbladder without cholecystitis without obstruction: Secondary | ICD-10-CM | POA: Diagnosis not present

## 2021-05-10 DIAGNOSIS — G8929 Other chronic pain: Secondary | ICD-10-CM | POA: Diagnosis present

## 2021-05-10 DIAGNOSIS — Z20822 Contact with and (suspected) exposure to covid-19: Secondary | ICD-10-CM | POA: Diagnosis present

## 2021-05-10 DIAGNOSIS — Z955 Presence of coronary angioplasty implant and graft: Secondary | ICD-10-CM

## 2021-05-10 DIAGNOSIS — R32 Unspecified urinary incontinence: Secondary | ICD-10-CM | POA: Diagnosis present

## 2021-05-10 DIAGNOSIS — E119 Type 2 diabetes mellitus without complications: Secondary | ICD-10-CM

## 2021-05-10 DIAGNOSIS — Z7984 Long term (current) use of oral hypoglycemic drugs: Secondary | ICD-10-CM

## 2021-05-10 DIAGNOSIS — E1121 Type 2 diabetes mellitus with diabetic nephropathy: Secondary | ICD-10-CM

## 2021-05-10 DIAGNOSIS — F17200 Nicotine dependence, unspecified, uncomplicated: Secondary | ICD-10-CM | POA: Diagnosis not present

## 2021-05-10 DIAGNOSIS — I959 Hypotension, unspecified: Secondary | ICD-10-CM | POA: Diagnosis not present

## 2021-05-10 DIAGNOSIS — N179 Acute kidney failure, unspecified: Secondary | ICD-10-CM | POA: Diagnosis not present

## 2021-05-10 DIAGNOSIS — J4520 Mild intermittent asthma, uncomplicated: Secondary | ICD-10-CM | POA: Diagnosis present

## 2021-05-10 DIAGNOSIS — R509 Fever, unspecified: Secondary | ICD-10-CM | POA: Diagnosis not present

## 2021-05-10 DIAGNOSIS — M549 Dorsalgia, unspecified: Secondary | ICD-10-CM

## 2021-05-10 DIAGNOSIS — Z8249 Family history of ischemic heart disease and other diseases of the circulatory system: Secondary | ICD-10-CM

## 2021-05-10 DIAGNOSIS — A419 Sepsis, unspecified organism: Secondary | ICD-10-CM | POA: Diagnosis not present

## 2021-05-10 DIAGNOSIS — M5126 Other intervertebral disc displacement, lumbar region: Secondary | ICD-10-CM | POA: Diagnosis present

## 2021-05-10 DIAGNOSIS — F3341 Major depressive disorder, recurrent, in partial remission: Secondary | ICD-10-CM | POA: Diagnosis present

## 2021-05-10 DIAGNOSIS — D3501 Benign neoplasm of right adrenal gland: Secondary | ICD-10-CM | POA: Diagnosis present

## 2021-05-10 DIAGNOSIS — I25118 Atherosclerotic heart disease of native coronary artery with other forms of angina pectoris: Secondary | ICD-10-CM | POA: Diagnosis present

## 2021-05-10 DIAGNOSIS — E860 Dehydration: Secondary | ICD-10-CM | POA: Diagnosis not present

## 2021-05-10 DIAGNOSIS — I10 Essential (primary) hypertension: Secondary | ICD-10-CM | POA: Diagnosis not present

## 2021-05-10 DIAGNOSIS — R6520 Severe sepsis without septic shock: Secondary | ICD-10-CM | POA: Diagnosis not present

## 2021-05-10 DIAGNOSIS — Z6830 Body mass index (BMI) 30.0-30.9, adult: Secondary | ICD-10-CM

## 2021-05-10 DIAGNOSIS — M47816 Spondylosis without myelopathy or radiculopathy, lumbar region: Secondary | ICD-10-CM | POA: Diagnosis present

## 2021-05-10 DIAGNOSIS — E872 Acidosis: Secondary | ICD-10-CM | POA: Diagnosis not present

## 2021-05-10 DIAGNOSIS — F17210 Nicotine dependence, cigarettes, uncomplicated: Secondary | ICD-10-CM | POA: Diagnosis not present

## 2021-05-10 DIAGNOSIS — Z79899 Other long term (current) drug therapy: Secondary | ICD-10-CM

## 2021-05-10 DIAGNOSIS — E669 Obesity, unspecified: Secondary | ICD-10-CM | POA: Diagnosis present

## 2021-05-10 DIAGNOSIS — N281 Cyst of kidney, acquired: Secondary | ICD-10-CM | POA: Diagnosis not present

## 2021-05-10 DIAGNOSIS — Z7982 Long term (current) use of aspirin: Secondary | ICD-10-CM

## 2021-05-10 DIAGNOSIS — E785 Hyperlipidemia, unspecified: Secondary | ICD-10-CM | POA: Diagnosis present

## 2021-05-10 DIAGNOSIS — J410 Simple chronic bronchitis: Secondary | ICD-10-CM | POA: Diagnosis not present

## 2021-05-10 DIAGNOSIS — R6521 Severe sepsis with septic shock: Secondary | ICD-10-CM | POA: Diagnosis not present

## 2021-05-10 DIAGNOSIS — M545 Low back pain, unspecified: Secondary | ICD-10-CM | POA: Diagnosis not present

## 2021-05-10 DIAGNOSIS — Z791 Long term (current) use of non-steroidal anti-inflammatories (NSAID): Secondary | ICD-10-CM

## 2021-05-10 DIAGNOSIS — E871 Hypo-osmolality and hyponatremia: Secondary | ICD-10-CM | POA: Diagnosis present

## 2021-05-10 LAB — COMPREHENSIVE METABOLIC PANEL
ALT: 38 U/L (ref 0–44)
AST: 41 U/L (ref 15–41)
Albumin: 3.8 g/dL (ref 3.5–5.0)
Alkaline Phosphatase: 111 U/L (ref 38–126)
Anion gap: 9 (ref 5–15)
BUN: 24 mg/dL — ABNORMAL HIGH (ref 8–23)
CO2: 25 mmol/L (ref 22–32)
Calcium: 9.8 mg/dL (ref 8.9–10.3)
Chloride: 93 mmol/L — ABNORMAL LOW (ref 98–111)
Creatinine, Ser: 1.73 mg/dL — ABNORMAL HIGH (ref 0.44–1.00)
GFR, Estimated: 32 mL/min — ABNORMAL LOW (ref >60)
Glucose, Bld: 184 mg/dL — ABNORMAL HIGH (ref 70–99)
Potassium: 3.9 mmol/L (ref 3.5–5.1)
Sodium: 127 mmol/L — ABNORMAL LOW (ref 135–145)
Total Bilirubin: 0.5 mg/dL (ref 0.3–1.2)
Total Protein: 7.2 g/dL (ref 6.5–8.1)

## 2021-05-10 LAB — CBC WITH DIFFERENTIAL/PLATELET
Abs Immature Granulocytes: 0.07 10*3/uL (ref 0.00–0.07)
Basophils Absolute: 0 10*3/uL (ref 0.0–0.1)
Basophils Relative: 0 %
Eosinophils Absolute: 0 10*3/uL (ref 0.0–0.5)
Eosinophils Relative: 0 %
HCT: 38 % (ref 36.0–46.0)
Hemoglobin: 13 g/dL (ref 12.0–15.0)
Immature Granulocytes: 1 %
Lymphocytes Relative: 3 %
Lymphs Abs: 0.3 10*3/uL — ABNORMAL LOW (ref 0.7–4.0)
MCH: 30.6 pg (ref 26.0–34.0)
MCHC: 34.2 g/dL (ref 30.0–36.0)
MCV: 89.4 fL (ref 80.0–100.0)
Monocytes Absolute: 0.3 10*3/uL (ref 0.1–1.0)
Monocytes Relative: 2 %
Neutro Abs: 9.6 10*3/uL — ABNORMAL HIGH (ref 1.7–7.7)
Neutrophils Relative %: 94 %
Platelets: 218 10*3/uL (ref 150–400)
RBC: 4.25 MIL/uL (ref 3.87–5.11)
RDW: 14 % (ref 11.5–15.5)
WBC: 10.2 10*3/uL (ref 4.0–10.5)
nRBC: 0 % (ref 0.0–0.2)

## 2021-05-10 LAB — PROTIME-INR
INR: 1.1 (ref 0.8–1.2)
Prothrombin Time: 14.5 seconds (ref 11.4–15.2)

## 2021-05-10 LAB — URINALYSIS, ROUTINE W REFLEX MICROSCOPIC
Bilirubin Urine: NEGATIVE
Glucose, UA: NEGATIVE mg/dL
Hgb urine dipstick: NEGATIVE
Ketones, ur: NEGATIVE mg/dL
Leukocytes,Ua: NEGATIVE
Nitrite: NEGATIVE
Protein, ur: 100 mg/dL — AB
Specific Gravity, Urine: 1.016 (ref 1.005–1.030)
pH: 5 (ref 5.0–8.0)

## 2021-05-10 LAB — POCT URINALYSIS DIPSTICK
Bilirubin, UA: NEGATIVE
Glucose, UA: NEGATIVE
Ketones, UA: NEGATIVE
Leukocytes, UA: NEGATIVE
Nitrite, UA: NEGATIVE
Protein, UA: POSITIVE — AB
Spec Grav, UA: 1.015 (ref 1.010–1.025)
Urobilinogen, UA: 0.2 E.U./dL
pH, UA: 6 (ref 5.0–8.0)

## 2021-05-10 LAB — SEDIMENTATION RATE: Sed Rate: 68 mm/hr — ABNORMAL HIGH (ref 0–22)

## 2021-05-10 LAB — PROCALCITONIN: Procalcitonin: 0.58 ng/mL

## 2021-05-10 LAB — TSH: TSH: 1.971 u[IU]/mL (ref 0.350–4.500)

## 2021-05-10 LAB — RESP PANEL BY RT-PCR (FLU A&B, COVID) ARPGX2
Influenza A by PCR: NEGATIVE
Influenza B by PCR: NEGATIVE
SARS Coronavirus 2 by RT PCR: NEGATIVE

## 2021-05-10 LAB — LACTIC ACID, PLASMA
Lactic Acid, Venous: 2.3 mmol/L (ref 0.5–1.9)
Lactic Acid, Venous: 2.3 mmol/L (ref 0.5–1.9)
Lactic Acid, Venous: 1.2 mmol/L (ref 0.5–1.9)

## 2021-05-10 LAB — GLUCOSE, CAPILLARY: Glucose-Capillary: 155 mg/dL — ABNORMAL HIGH (ref 70–99)

## 2021-05-10 LAB — CBG MONITORING, ED: Glucose-Capillary: 145 mg/dL — ABNORMAL HIGH (ref 70–99)

## 2021-05-10 LAB — MAGNESIUM: Magnesium: 1.6 mg/dL — ABNORMAL LOW (ref 1.7–2.4)

## 2021-05-10 LAB — C-REACTIVE PROTEIN: CRP: 25.1 mg/dL — ABNORMAL HIGH (ref <1.0)

## 2021-05-10 LAB — APTT: aPTT: 35 seconds (ref 24–36)

## 2021-05-10 MED ORDER — VANCOMYCIN HCL IN DEXTROSE 1-5 GM/200ML-% IV SOLN: 1000.0000 mg | Freq: Once | INTRAVENOUS | Status: DC

## 2021-05-10 MED ORDER — SODIUM CHLORIDE 0.9 % IV SOLN
2.0000 g | Freq: Two times a day (BID) | INTRAVENOUS | Status: DC
  Administered 2021-05-10 – 2021-05-12 (×4): 2 g via INTRAVENOUS
  Filled 2021-05-10 (×4): qty 2

## 2021-05-10 MED ORDER — ONDANSETRON HCL 4 MG PO TABS: 4.0000 mg | ORAL_TABLET | Freq: Four times a day (QID) | ORAL | Status: DC | PRN

## 2021-05-10 MED ORDER — ACETAMINOPHEN 650 MG RE SUPP
650.0000 mg | Freq: Four times a day (QID) | RECTAL | Status: DC | PRN
Start: 2021-05-10 — End: 2021-05-12

## 2021-05-10 MED ORDER — ACETAMINOPHEN 325 MG PO TABS
650.0000 mg | ORAL_TABLET | Freq: Four times a day (QID) | ORAL | Status: DC | PRN
  Administered 2021-05-10 – 2021-05-12 (×4): 650 mg via ORAL
  Filled 2021-05-10 (×4): qty 2

## 2021-05-10 MED ORDER — LACTATED RINGERS IV BOLUS (SEPSIS)
1000.0000 mL | Freq: Once | INTRAVENOUS | Status: AC
  Administered 2021-05-10: 1000 mL via INTRAVENOUS

## 2021-05-10 MED ORDER — LACTATED RINGERS IV SOLN: INTRAVENOUS | Status: DC

## 2021-05-10 MED ORDER — INSULIN ASPART 100 UNIT/ML IJ SOLN
0.0000 [IU] | Freq: Three times a day (TID) | INTRAMUSCULAR | Status: DC
Start: 2021-05-10 — End: 2021-05-12
  Administered 2021-05-10 – 2021-05-11 (×3): 2 [IU] via SUBCUTANEOUS
  Filled 2021-05-10: qty 0.15

## 2021-05-10 MED ORDER — HYDROCODONE-ACETAMINOPHEN 5-325 MG PO TABS: 1.0000 | ORAL_TABLET | ORAL | Status: DC | PRN

## 2021-05-10 MED ORDER — VENLAFAXINE HCL ER 75 MG PO CP24
75.0000 mg | ORAL_CAPSULE | Freq: Three times a day (TID) | ORAL | Status: DC
  Administered 2021-05-10 – 2021-05-12 (×5): 75 mg via ORAL
  Filled 2021-05-10 (×5): qty 1

## 2021-05-10 MED ORDER — IBUPROFEN 800 MG PO TABS
800.0000 mg | ORAL_TABLET | Freq: Once | ORAL | Status: AC
  Administered 2021-05-10: 800 mg via ORAL
  Filled 2021-05-10: qty 1

## 2021-05-10 MED ORDER — ALBUTEROL SULFATE HFA 108 (90 BASE) MCG/ACT IN AERS: 2.0000 | INHALATION_SPRAY | Freq: Four times a day (QID) | RESPIRATORY_TRACT | Status: DC | PRN

## 2021-05-10 MED ORDER — METRONIDAZOLE 500 MG/100ML IV SOLN
500.0000 mg | Freq: Three times a day (TID) | INTRAVENOUS | Status: DC
  Administered 2021-05-10 – 2021-05-12 (×5): 500 mg via INTRAVENOUS
  Filled 2021-05-10 (×6): qty 100

## 2021-05-10 MED ORDER — SODIUM CHLORIDE 0.9 % IV SOLN
1.0000 g | INTRAVENOUS | Status: DC
  Administered 2021-05-10: 1 g via INTRAVENOUS
  Filled 2021-05-10: qty 10

## 2021-05-10 MED ORDER — ASPIRIN 81 MG PO CHEW
81.0000 mg | CHEWABLE_TABLET | Freq: Every day | ORAL | Status: DC
  Administered 2021-05-10 – 2021-05-11 (×2): 81 mg via ORAL
  Filled 2021-05-10 (×2): qty 1

## 2021-05-10 MED ORDER — ACETAMINOPHEN 325 MG PO TABS
650.0000 mg | ORAL_TABLET | Freq: Once | ORAL | Status: AC
  Administered 2021-05-10: 650 mg via ORAL
  Filled 2021-05-10: qty 2

## 2021-05-10 MED ORDER — VANCOMYCIN HCL 1250 MG/250ML IV SOLN
1250.0000 mg | INTRAVENOUS | Status: DC
  Administered 2021-05-10: 1250 mg via INTRAVENOUS
  Filled 2021-05-10: qty 250

## 2021-05-10 MED ORDER — ATORVASTATIN CALCIUM 10 MG PO TABS
20.0000 mg | ORAL_TABLET | Freq: Every day | ORAL | Status: DC
  Administered 2021-05-11 – 2021-05-12 (×2): 20 mg via ORAL
  Filled 2021-05-10 (×2): qty 2

## 2021-05-10 MED ORDER — SODIUM CHLORIDE 0.9 % IV SOLN
1.0000 g | Freq: Once | INTRAVENOUS | Status: DC
  Filled 2021-05-10: qty 1

## 2021-05-10 MED ORDER — SODIUM CHLORIDE 0.9 % IV SOLN: 2.0000 g | Freq: Two times a day (BID) | INTRAVENOUS | Status: DC

## 2021-05-10 MED ORDER — ALPRAZOLAM 0.5 MG PO TABS
0.5000 mg | ORAL_TABLET | Freq: Every day | ORAL | Status: DC | PRN
  Administered 2021-05-11: 0.5 mg via ORAL
  Filled 2021-05-10: qty 1

## 2021-05-10 MED ORDER — ALBUTEROL SULFATE (2.5 MG/3ML) 0.083% IN NEBU: 2.5000 mg | INHALATION_SOLUTION | Freq: Four times a day (QID) | RESPIRATORY_TRACT | Status: DC | PRN

## 2021-05-10 MED ORDER — MAGNESIUM SULFATE IN D5W 1-5 GM/100ML-% IV SOLN
1.0000 g | Freq: Once | INTRAVENOUS | Status: AC
  Administered 2021-05-10: 1 g via INTRAVENOUS
  Filled 2021-05-10: qty 100

## 2021-05-10 MED ORDER — SODIUM CHLORIDE 0.9 % IV SOLN: 2.0000 g | Freq: Once | INTRAVENOUS | Status: DC

## 2021-05-10 MED ORDER — INSULIN ASPART 100 UNIT/ML IJ SOLN
0.0000 [IU] | Freq: Every day | INTRAMUSCULAR | Status: DC
  Filled 2021-05-10: qty 0.05

## 2021-05-10 MED ORDER — HEPARIN SODIUM (PORCINE) 5000 UNIT/ML IJ SOLN
5000.0000 [IU] | Freq: Three times a day (TID) | INTRAMUSCULAR | Status: DC
  Administered 2021-05-10 – 2021-05-12 (×5): 5000 [IU] via SUBCUTANEOUS
  Filled 2021-05-10 (×6): qty 1

## 2021-05-10 MED ORDER — ONDANSETRON HCL 4 MG/2ML IJ SOLN
4.0000 mg | Freq: Four times a day (QID) | INTRAMUSCULAR | Status: DC | PRN
  Administered 2021-05-10: 4 mg via INTRAVENOUS
  Filled 2021-05-10: qty 2

## 2021-05-10 NOTE — Progress Notes (Signed)
Subjective  CC:  Chief Complaint  Patient presents with   Excessive Sweating    Had a 103  fever 2 nights ago   Back Pain    Started last week. Lower back    HPI: Jessica Curry is a 70 y.o. female who presents to the office today to address the problems listed above in the chief complaint. 70 year old with history of pyelonephritis/urosepsis with hyponatremia back in March for which she was hospitalized presents due to spiking fevers, generalized weakness, worsening acute low back pain and malaise.  She was seen last week for UTI.  Urine culture grew out E. coli sensitive to amoxicillin.  She completed a week's course of amoxicillin and felt her dysuria and urinary symptoms improved.  However, she has developed high fevers up to 103, T-max was 100.9 this morning responsive to Tylenol.  She also has lost her appetite, has moderate to severe bilateral low back pain worse than her usual, and feels lightheaded with standing.  She is diaphoretic.  She denies URI symptoms, cough, shortness of breath, nausea or vomiting although she did have 1 episode of nausea this AM.  She has had no diarrhea.  She denies significant lower abdominal pain.  She has no arthralgias or rashes.  She was advised to be seen yesterday due to her symptoms but refused.  She is mostly been lying in bed over the last 2 days. Last hospitalization showed an exophytic renal cyst on ultrasound and CT scan.  47-month follow-up was recommended.  She does have history of urosepsis. She is a diabetic with her last A1c 6.9 4 months ago.  She does take metformin.  She is a chronic smoker.  She has mild chronic hyponatremia.  Last hospitalization showed acute kidney injury on top of chronic mild kidney disease.  This had normalized after her fluid resuscitation.  Assessment  1. Fever, unspecified fever cause   2. History of sepsis   3. Hypotension, unspecified hypotension type   4. Simple chronic bronchitis ([place])   5. Controlled type 2  diabetes mellitus without complication, without long-term current use of insulin ([place])   6. Smoker      Plan  Possible early sepsis, unclear source: Patient is hypotensive and diaphoretic.  With fevers and hypotension, recommend referral to emergency room for further evaluation.  Patient had recent UTI, however E. coli was pansensitive and she was treated for infection with 1 week of amoxicillin.  Her back pain is doing worse.  Differential diagnosis includes urosepsis (but UA looks ok today),  or sepsis from another source including back or abdomen.  Patient needs IV fluids, lab work and imaging studies.  She has history of hyponatremia and had acute kidney injury as well.  Patient will take herself to emergency room for further evaluation.  Charge nurse will be notified.  Recommend lab work, urine studies, possible lumbar CT to rule out abscess if other sources of infection are not found with chest x-ray and urine studies.  Patient understands and agrees with recommendation. Will need to hold metformin and anti-inflammatories.  Recheck sodium and kidney function.  Follow up: As scheduled 06/13/2021  Orders Placed This Encounter  Procedures   POCT Urinalysis Dipstick   No orders of the defined types were placed in this encounter.     I reviewed the patients updated PMH, FH, and SocHx.    Patient Active Problem List   Diagnosis Date Noted   History of heart artery stent 02/10/2020  Priority: High   Simple chronic bronchitis ([place]) 02/11/2018    Priority: High   Hyperlipidemia associated with type 2 diabetes mellitus ([place]) 06/04/2017    Priority: High   Diabetes mellitus type II, controlled ([place]) 09/10/2016    Priority: High   CAD (coronary artery disease), native coronary artery 09/10/2016    Priority: High   Hypertension associated with diabetes ([place]) 09/10/2016    Priority: High   Smoker 09/10/2016    Priority: High   Depression, major, recurrent, in partial remission ([place])      Priority: High   Chronic hyponatremia 02/07/2021    Priority: Medium   Bilateral renal cysts 02/06/2021    Priority: Medium   Chronic midline low back pain without sciatica 12/26/2020    Priority: Medium   Serrated polyp of colon 08/12/2018    Priority: Medium   Osteopenia 06/10/2017    Priority: Medium   Encounter for screening for lung cancer 02/10/2020    Priority: Low   Mild intermittent asthma 08/06/2019    Priority: Low   Alcohol use 02/07/2021   Chronic kidney disease (CKD) stage G3a/A1, moderately decreased glomerular filtration rate (GFR) between 45-59 mL/min/1.73 square meter and albuminuria creatinine ratio less than 30 mg/g ([facility]) 02/07/2021   Current Meds  Medication Sig   albuterol (PROAIR HFA) 108 (90 Base) MCG/ACT inhaler Inhale 2 puffs into the lungs every 6 (six) hours as needed for wheezing or shortness of breath.   ALPRAZolam (XANAX) 0.5 MG tablet Take 1 tablet (0.5 mg total) by mouth daily as needed for anxiety. (Patient taking differently: Take 0.25-0.5 mg by mouth daily as needed for anxiety.)   aspirin 81 MG tablet Take 81 mg by mouth at bedtime.   cloNIDine (CATAPRES) 0.1 MG tablet Take 1 tablet (0.1 mg total) by mouth 3 (three) times daily.   COVID-19 mRNA Vac-TriS, Pfizer, (PFIZER-BIONT COVID-19 VAC-TRIS) SUSP injection Inject into the muscle.   diphenhydrAMINE (BENADRYL) 25 MG tablet Take 25 mg by mouth every 6 (six) hours as needed for allergies or sleep.   lisinopril (ZESTRIL) 40 MG tablet Take 1 tablet (40 mg total) by mouth daily.   meloxicam (MOBIC) 15 MG tablet Take 1 tablet (15 mg total) by mouth daily.   metFORMIN (GLUCOPHAGE) 1000 MG tablet TAKE 1 TABLET BY MOUTH  TWICE DAILY WITH MEALS   Multiple Vitamins-Minerals (MULTIVITAMIN WITH MINERALS) tablet Take 1 tablet by mouth at bedtime. Centrum silver for women   rosuvastatin (CRESTOR) 20 MG tablet Take 1 tablet (20 mg total) by mouth daily.   venlafaxine XR (EFFEXOR-XR) 75 MG 24 hr capsule TAKE THREE  CAPSULES BY MOUTH DAILY WITH BREAKFAST    Allergies: Patient has No Known Allergies. Family History: Patient family history includes AAA (abdominal aortic aneurysm) in her mother; Arthritis in her brother and mother; Dementia in her maternal grandmother; Heart attack in her brother; Hyperlipidemia in her brother; Hypertension in her brother; Hypothyroidism in her mother; Kidney disease in her mother; Other in her father; Ovarian cancer in her mother; Stroke in her brother. Social History:  Patient  reports that she has been smoking cigarettes. She has a 14.40 pack-year smoking history. She has never used smokeless tobacco. She reports current alcohol use of about 4.0 standard drinks of alcohol per week. She reports that she does not use drugs.  Review of Systems: Constitutional: Negative for fever malaise or anorexia Cardiovascular: negative for chest pain Respiratory: negative for SOB or persistent cough Gastrointestinal: negative for abdominal pain  Objective  Vitals:  BP (!) 93/57   Pulse 62   Temp 98.7 F (37.1 C) (Temporal)   Ht 5\' 7"  (1.702 m)   Wt 191 lb 12.8 oz (87 kg)   LMP  (LMP Unknown)   SpO2 97%   BMI 30.04 kg/m  General: No respiratory distress, diaphoretic.  Alert and oriented x3, occasional cough HEENT: PEERL, conjunctiva normal, neck is supple Cardiovascular:  RRR without murmur or gallop.  No edema Respiratory:  Good breath sounds bilaterally, rhonchi present without wheezing or rales Gastrointestinal: soft, flat abdomen, normal active bowel sounds, no palpable masses, no hepatosplenomegaly, no appreciated hernias, very minimal abdominal tenderness, no suprapubic tenderness, no CVA tenderness Back: Lower lumbar paravertebral area reportedly painful but not tender.  Able to get to the exam table cautious.  Moving slowly. Skin:  Warm, no rashes  No visits with results within 1 Day(s) from this visit.  Latest known visit with results is:  Office Visit on 05/01/2021   Component Date Value Ref Range Status   Color, UA 05/01/2021 yellow   Final   Clarity, UA 05/01/2021 cloudy   Final   Glucose, UA 05/01/2021 Negative  Negative Final   Bilirubin, UA 05/01/2021 negative   Final   Ketones, UA 05/01/2021 negative   Final   Spec Grav, UA 05/01/2021 1.025  1.010 - 1.025 Final   Blood, UA 05/01/2021 3+   Final   pH, UA 05/01/2021 6.0  5.0 - 8.0 Final   Protein, UA 05/01/2021 Positive (A) Negative Final   Urobilinogen, UA 05/01/2021 0.2  0.2 or 1.0 E.U./dL Final   Nitrite, UA 05/01/2021 negative   Final   Leukocytes, UA 05/01/2021 Small (1+) (A) Negative Final   MICRO NUMBER: 05/01/2021 [id number]   Final   SPECIMEN QUALITY: 05/01/2021 Adequate   Final   Sample Source 05/01/2021 NOT GIVEN   Final   STATUS: 05/01/2021 FINAL   Final   ISOLATE 1: 05/01/2021 Escherichia coli (A)  Final   Lab Results  Component Value Date   CREATININE 1.08 02/15/2021   BUN 19 02/15/2021   NA 134 (L) 02/15/2021   K 4.9 02/15/2021   CL 100 02/15/2021   CO2 26 02/15/2021   Lab Results  Component Value Date   WBC 4.6 02/07/2021   HGB 10.5 (L) 02/07/2021   HCT 31.2 (L) 02/07/2021   MCV 88.1 02/07/2021   PLT 249 02/07/2021   Lab Results  Component Value Date   HGBA1C 6.9 (H) 12/26/2020   HGBA1C 6.5 (H) 09/15/2020   HGBA1C 6.5 (A) 05/11/2020      Commons side effects, risks, benefits, and alternatives for medications and treatment plan prescribed today were discussed, and the patient expressed understanding of the given instructions. Patient is instructed to call or message via MyChart if he/she has any questions or concerns regarding our treatment plan. No barriers to understanding were identified. We discussed Red Flag symptoms and signs in detail. Patient expressed understanding regarding what to do in case of urgent or emergency type symptoms.  Medication list was reconciled, printed and provided to the patient in AVS. Patient instructions and summary information was  reviewed with the patient as documented in the AVS. This note was prepared with assistance of Dragon voice recognition software. Occasional wrong-word or sound-a-like substitutions may have occurred due to the inherent limitations of voice recognition software  This visit occurred during the SARS-CoV-2 public health emergency.  Safety protocols were in place, including screening questions prior to the visit, additional usage of staff PPE,  and extensive cleaning of exam room while observing appropriate contact time as indicated for disinfecting solutions.

## 2021-05-10 NOTE — Progress Notes (Signed)
Pharmacy Antibiotic Note  Jessica Curry is a 70 y.o. female admitted on 05/10/2021 with  fever .  Pharmacy has been consulted for cefepime and vancomycin dosing.  Flagyl 500 mg IV every 8 hours has been ordered by provider  Plan: Cefepime 2 g IV every 12 hours Vancomycin 1250 mg IV every 48 hours (Goal AUC 400-550, Expected AUC: 492.5, SCr used: 1.73) Monitor clinical picture, renal function, vancomycin levels if indicated F/U C&S, abx deescalation / LOT       Temp (24hrs), Avg:99.3 F (37.4 C), Min:97.9 F (36.6 C), Max:101.2 F (38.4 C)  Recent Labs  Lab 05/10/21 1238 05/10/21 1326  WBC 10.2  --   CREATININE 1.73*  --   LATICACIDVEN  --  2.3*    Estimated Creatinine Clearance: 34.8 mL/min (A) (by C-G formula based on SCr of 1.73 mg/dL (H)).    No Known Allergies  Antimicrobials this admission: 6/15 ceftriaxone x 1 6/15 cefepime >>  6/15 vancomycin >>   Dose adjustments this admission:   Microbiology results: 6/15 BCx: sent 6/15 UCx: sent    Thank you for allowing pharmacy to be a part of this patient's care.  Champagne Paletta P. Legrand Como, PharmD, [place] Please utilize Amion for appropriate phone number to reach the unit pharmacist ([place]) 05/10/2021 5:19 PM

## 2021-05-10 NOTE — ED Notes (Signed)
Pt rectal 104.4 MD messaged and paged. Ice packs applied to neck and groin.

## 2021-05-10 NOTE — Sepsis Progress Note (Signed)
Notified provider of need to order another repeat lactic acid, as the second was the same as the first and therefore did not show a downward trend.

## 2021-05-10 NOTE — ED Triage Notes (Signed)
Pt sent from PCP for fever of unknown origen x 2.5 days. Was treated for UTI last week, finished abx 2 days ago. Reports back pain x 2 weeks.

## 2021-05-10 NOTE — H&P (Signed)
History and Physical    Jessica Curry MAU:[id number] DOB: 06/12/1951 DOA: 05/10/2021  PCP: Leamon Arnt, MD  Patient coming from: PCP office  I have personally briefly reviewed patient's old medical records in [place]  Chief Complaint: Fever and low blood pressure  HPI: Jessica Curry is a 70 y.o. female with medical history significant of allergy, asthma, depression, type 2 diabetes mellitus, hyperlipidemia and hypertension was sent from PCPs office due to fever and low blood pressure.  According to patient, she was diagnosed with UTI and was prescribed amoxicillin which she took for 6 days however since last 2 days, she started having fever so she went to see her PCP.  Reportedly her temperature was 99.  PCPs office, patient was below so she was sent to the emergency department.  Besides fever, patient has been having low back pain since about a month which has gotten worse over the last few days.  She has no other complaints such as chest pain, shortness of breath, cough, urinary complaints, any problem with bowel movement, chills, sweating, any recent travel or any sick contacts.  She did complain of abdominal pain to the ED physician however she did not complain of such to me.  When I saw her, she was shivering and complaining of back pain.  ED Course: Upon arrival to ED, initially she was afebrile and blood pressure low 95/58.  Subsequently she developed fever of 1-1.2.  She remained intermittently bradycardic.  She received 1 L IV fluid bolus and her blood pressure improved.  CBC did not show any leukocytosis, CMP showed mild AKI.Chest x-ray unremarkable.  Due to abdominal pain, CT abdomen was done which is unremarkable as well.  UA still pending.  She has tested negative for COVID-19.  Lactic acid 2.3.  CRP significantly elevated as well  Review of Systems: As per HPI otherwise negative.    Past Medical History:  Diagnosis Date   Allergy    Asthma    Chicken pox     Depression    reasonable control on venlafaxine 75mg  TID. xanax #14 lasts about 2 years for anxiety portion.    Diabetes mellitus    Genital warts    no outbreaks since the 80s   History of heart artery stent 02/10/2020   Hyperlipidemia    Hypertension     Past Surgical History:  Procedure Laterality Date   CORONARY ANGIOPLASTY WITH STENT PLACEMENT     2012, [place]   DILATION AND CURETTAGE OF UTERUS     TONSILLECTOMY  1968     reports that she has been smoking cigarettes. She has a 14.40 pack-year smoking history. She has never used smokeless tobacco. She reports current alcohol use of about 4.0 standard drinks of alcohol per week. She reports that she does not use drugs.  No Known Allergies  Family History  Problem Relation Age of Onset   Arthritis Mother    Ovarian cancer Mother    AAA (abdominal aortic aneurysm) Mother    Kidney disease Mother    Hypothyroidism Mother    Other Father        does not know family history for father   Stroke Brother        retired MD. [employer]   Heart attack Brother    Arthritis Brother        hip replacement   Hyperlipidemia Brother    Hypertension Brother    Dementia Maternal Grandmother    Colon cancer  Neg Hx    Esophageal cancer Neg Hx    Rectal cancer Neg Hx    Stomach cancer Neg Hx     Prior to Admission medications   Medication Sig Start Date End Date Taking? Authorizing Provider  albuterol (PROAIR HFA) 108 (90 Base) MCG/ACT inhaler Inhale 2 puffs into the lungs every 6 (six) hours as needed for wheezing or shortness of breath. 02/28/18  Yes Rigoberto Noel, MD  ALPRAZolam Duanne Moron) 0.5 MG tablet Take 1 tablet (0.5 mg total) by mouth daily as needed for anxiety. Patient taking differently: Take 0.25-0.5 mg by mouth daily as needed for anxiety. 02/01/21  Yes Leamon Arnt, MD  amoxicillin (AMOXIL) 875 MG tablet Take 875 mg by mouth 2 (two) times daily. Start date : 05/01/21   Yes [provider]  aspirin 81 MG tablet Take 81  mg by mouth at bedtime.   Yes [provider]  atorvastatin (LIPITOR) 20 MG tablet Take 20 mg by mouth daily. 03/13/21  Yes [provider]  cloNIDine (CATAPRES) 0.1 MG tablet Take 1 tablet (0.1 mg total) by mouth 3 (three) times daily. 12/26/20  Yes Leamon Arnt, MD  diphenhydrAMINE (BENADRYL) 25 MG tablet Take 25 mg by mouth every 6 (six) hours as needed for allergies or sleep.   Yes [provider]  fluocinonide (LIDEX) 0.05 % external solution Apply 1 application topically daily. 03/23/21  Yes [provider]  lisinopril (ZESTRIL) 40 MG tablet Take 1 tablet (40 mg total) by mouth daily. 02/16/21  Yes Leamon Arnt, MD  meloxicam (MOBIC) 15 MG tablet Take 1 tablet (15 mg total) by mouth daily. 05/01/21  Yes Leamon Arnt, MD  metFORMIN (GLUCOPHAGE) 1000 MG tablet TAKE 1 TABLET BY MOUTH  TWICE DAILY WITH MEALS Patient taking differently: Take 1,000 mg by mouth 2 (two) times daily with a meal. 11/02/20  Yes Marin Olp, MD  Multiple Vitamins-Minerals (MULTIVITAMIN WITH MINERALS) tablet Take 1 tablet by mouth daily. Centrum silver for women   Yes [provider]  nitroGLYCERIN (NITROSTAT) 0.4 MG SL tablet Place 1 tablet (0.4 mg total) under the tongue every 5 (five) minutes as needed for chest pain. 02/11/18 05/10/21 Yes Burtis Junes, NP  venlafaxine XR (EFFEXOR-XR) 75 MG 24 hr capsule TAKE THREE CAPSULES BY MOUTH DAILY WITH BREAKFAST Patient taking differently: Take 75 mg by mouth 3 (three) times daily. 04/25/21  Yes Leamon Arnt, MD  COVID-19 mRNA Vac-TriS, Pfizer, (PFIZER-BIONT COVID-19 VAC-TRIS) SUSP injection Inject into the muscle. Patient not taking: No sig reported 04/10/21   Carlyle Basques, MD  rosuvastatin (CRESTOR) 20 MG tablet Take 1 tablet (20 mg total) by mouth daily. Patient not taking: Reported on 05/10/2021 01/05/21   Leamon Arnt, MD    Physical Exam: Vitals:   05/10/21 1429 05/10/21 1500 05/10/21 1544 05/10/21 1622  BP:  (!) 116/93 118/63 140/86   Pulse: (!) 59 (!) 57 65   Resp: 16 18 16    Temp:    (!) 101.2 F (38.4 C)  TempSrc:    Oral  SpO2: 100% 100% 99%     Constitutional: NAD, calm, comfortable Vitals:   05/10/21 1429 05/10/21 1500 05/10/21 1544 05/10/21 1622  BP: (!) 116/93 118/63 140/86   Pulse: (!) 59 (!) 57 65   Resp: 16 18 16    Temp:    (!) 101.2 F (38.4 C)  TempSrc:    Oral  SpO2: 100% 100% 99%    Eyes: PERRL, lids  and conjunctivae normal, patient is receiving his urine ENMT: Mucous membranes are dry. Posterior pharynx clear of any exudate or lesions.Normal dentition.  Neck: normal, supple, no masses, no thyromegaly Respiratory: clear to auscultation bilaterally, no wheezing, no crackles. Normal respiratory effort. No accessory muscle use.  Cardiovascular: Regular rate and rhythm, no murmurs / rubs / gallops. No extremity edema. 2+ pedal pulses. No carotid bruits.  Abdomen: no tenderness, no masses palpated. No hepatosplenomegaly. Bowel sounds positive.  Musculoskeletal: no clubbing / cyanosis. No joint deformity upper and lower extremities. Good ROM, no contractures. Normal muscle tone.  Complains of point tenderness on the lower mid back. Skin: no rashes, lesions, ulcers. No induration Neurologic: CN 2-12 grossly intact. Sensation intact, DTR normal. Strength 5/5 in all 4.  Psychiatric: Normal judgment and insight. Alert and oriented x 3. Normal mood.    Labs on Admission: I have personally reviewed following labs and imaging studies  CBC: Recent Labs  Lab 05/10/21 1238  WBC 10.2  NEUTROABS 9.6*  HGB 13.0  HCT 38.0  MCV 89.4  PLT 557   Basic Metabolic Panel: Recent Labs  Lab 05/10/21 1238  NA 127*  K 3.9  CL 93*  CO2 25  GLUCOSE 184*  BUN 24*  CREATININE 1.73*  CALCIUM 9.8   GFR: Estimated Creatinine Clearance: 34.8 mL/min (A) (by C-G formula based on SCr of 1.73 mg/dL (H)). Liver Function Tests: Recent Labs  Lab 05/10/21 1238  AST 41  ALT 38  ALKPHOS  111  BILITOT 0.5  PROT 7.2  ALBUMIN 3.8   No results for input(s): LIPASE, AMYLASE in the last 168 hours. No results for input(s): AMMONIA in the last 168 hours. Coagulation Profile: Recent Labs  Lab 05/10/21 1326  INR 1.1   Cardiac Enzymes: No results for input(s): CKTOTAL, CKMB, CKMBINDEX, TROPONINI in the last 168 hours. BNP (last 3 results) No results for input(s): PROBNP in the last 8760 hours. HbA1C: No results for input(s): HGBA1C in the last 72 hours. CBG: No results for input(s): GLUCAP in the last 168 hours. Lipid Profile: No results for input(s): CHOL, HDL, LDLCALC, TRIG, CHOLHDL, LDLDIRECT in the last 72 hours. Thyroid Function Tests: No results for input(s): TSH, T4TOTAL, FREET4, T3FREE, THYROIDAB in the last 72 hours. Anemia Panel: No results for input(s): VITAMINB12, FOLATE, FERRITIN, TIBC, IRON, RETICCTPCT in the last 72 hours. Urine analysis:    Component Value Date/Time   COLORURINE YELLOW 05/10/2021 1213   APPEARANCEUR HAZY (A) 05/10/2021 1213   LABSPEC 1.016 05/10/2021 1213   PHURINE 5.0 05/10/2021 1213   GLUCOSEU NEGATIVE 05/10/2021 1213   HGBUR NEGATIVE 05/10/2021 1213   BILIRUBINUR NEGATIVE 05/10/2021 1213   BILIRUBINUR neg 05/10/2021 0900   KETONESUR NEGATIVE 05/10/2021 1213   PROTEINUR 100 (A) 05/10/2021 1213   PROTEINUR Positive (A) 05/10/2021 0900   UROBILINOGEN 0.2 05/10/2021 0900   UROBILINOGEN 1.0 09/14/2014 1614   NITRITE NEGATIVE 05/10/2021 1213   NITRITE neg 05/10/2021 0900   LEUKOCYTESUR NEGATIVE 05/10/2021 1213   LEUKOCYTESUR Negative 05/10/2021 0900    Radiological Exams on Admission: CT ABDOMEN PELVIS WO CONTRAST  Result Date: 05/10/2021 CLINICAL DATA:  Abdominal pain and fever EXAM: CT ABDOMEN AND PELVIS WITHOUT CONTRAST TECHNIQUE: Multidetector CT imaging of the abdomen and pelvis was performed following the standard protocol without IV contrast. COMPARISON:  Ultrasound 03/07/2021.  CT 02/06/2021. FINDINGS: Lower chest: Normal  Hepatobiliary: Multiple pigment stones within the gallbladder. No CT evidence of cholecystitis or obstruction. No liver parenchymal abnormality seen. Pancreas: Normal.  Small  duodenal diverticulum. Spleen: Normal Adrenals/Urinary Tract: Adrenal gland on the left is normal. Low-density lesion with punctate calcification of right adrenal gland is unchanged most consistent with an adenoma. Maximal dimension 1.9 cm. Bladder is normal. Few small renal cysts. No evidence stone or hydronephrosis. No evidence of swelling or edema. Stomach/Bowel: Stomach and small intestine appear normal. Mild diverticulosis of the left colon without CT evidence of diverticulitis. Redundant but unremarkable cecum. Vascular/Lymphatic: Aortic atherosclerosis. No aneurysm. IVC is normal. No retroperitoneal adenopathy. Reproductive: No pelvic mass. Other: No free fluid or air. Musculoskeletal: Scoliosis and chronic degenerative changes of the spine. IMPRESSION: No acute finding in the abdomen. Multiple pigment stones in the gallbladder but without CT evidence of cholecystitis or obstruction. No evidence of urinary tract inflammatory disease by CT. Right adrenal adenoma measuring 1.9 cm as seen previously. Chronic lumbar degenerative changes which could be painful. Electronically Signed   By: Nelson Chimes M.D.   On: 05/10/2021 16:03   DG Chest Port 1 View  Result Date: 05/10/2021 CLINICAL DATA:  Sepsis.  Fever of unknown origin for 2.5 days. EXAM: PORTABLE CHEST 1 VIEW COMPARISON:  09/14/2014 FINDINGS: The heart size and mediastinal contours are within normal limits. Both lungs are clear. The visualized skeletal structures are unremarkable. IMPRESSION: No active disease. Electronically Signed   By: Kerby Moors M.D.   On: 05/10/2021 14:02    EKG: Independently reviewed.  Sinus rhythm with right bundle branch block.  Assessment/Plan Active Problems:   Fever   Fever/back pain: No source has been identified for patient's fever however  it is obvious that she is growing some sort of infection similar with fever, elevated lactic acid and CRP.  CT abdomen negative as mentioned above.  Will need to investigate more about her back pain.  We will initiate with x-ray lumbar spine knowing that eventually she might need CT or MRI of the lumbar spine.  Blood culture drawn.  Will start on cefepime, Flagyl as well as vancomycin.  She has received only 1 L of IV fluid in the ED.  I will give her 1 more liter of fluid bolus and then will place her on continuous LR at 125 cc/h.  Hypotension/History of hypertension: She was hypotensive earlier but now blood pressure is better.  I will hold oral antihypertensives for now.  Type 2 diabetes mellitus: Takes metformin at home which I will hold.  Start on SSI.  AKI: Likely prerenal secondary to infection or fever.  Will avoid nephrotoxic agents and hold all nephrotoxic agents and started fluids and repeat labs in the morning.  History of CAD: Asymptomatic.  Resume aspirin.  Mild intermittent asthma, not in acute exacerbation.  Resume home medications.  Hyponatremia: Looking at detailed chart review, her sodium has remained typically between 125 and 132.  Currently 127.  DVT prophylaxis: heparin injection 5,000 Units Start: 05/10/21 1700 Code Status: Full code Family Communication: None present at bedside.  Plan of care discussed with patient in length and he verbalized understanding and agreed with it. Disposition Plan: To be determined Consults called: None Admission status: Observation   Status is: Observation  The patient remains OBS appropriate and will d/c before 2 midnights.  Dispo: The patient is from: Home              Anticipated d/c is to: Home              Patient currently is not medically stable to d/c.   Difficult to place patient No  Darliss Cheney MD [facility]  05/10/2021, 4:49 PM  To contact the attending provider between 7A-7P or the covering provider  during after hours 7P-7A, please log into the web site [email or web address]

## 2021-05-10 NOTE — Patient Instructions (Signed)
Please go to [facility] ER: I have completed a note for their review.   You may follow up with me in 2-4 weeks or as instructed by [facility].

## 2021-05-10 NOTE — ED Notes (Signed)
Pt projectile vomited across the room x2, acute confusion, does not remember this RN, asking for her mother.

## 2021-05-10 NOTE — Sepsis Progress Note (Signed)
eLink is monitoring this Code Sepsis. °

## 2021-05-10 NOTE — ED Notes (Signed)
Coming from [place]-states fevers of unknown origins and chills-history of sepsis

## 2021-05-10 NOTE — ED Notes (Signed)
Lactic 2.3, MD notified.

## 2021-05-10 NOTE — ED Provider Notes (Signed)
Emergency Medicine Provider Triage Evaluation Note  Jessica Curry , a 70 y.o. female  was evaluated in triage.  Pt complains of fever for past two days. Concerned for possible UTI. Negative home covid test last night.  Review of Systems  Positive: Fever, bilateral back pain Negative: Abdominal pain, chest pain, cough, vomiting, diarrhea  Physical Exam  BP (!) 95/58 (BP Location: Left Arm) Comment: informed the nurse of b/p  Pulse 71   Temp 97.9 F (36.6 C) (Oral)   Resp 16   LMP  (LMP Unknown)   SpO2 95%  Gen:   Awake, no distress   Resp:  Normal effort  MSK:   Moves extremities without difficulty  Other:    Medical Decision Making  Medically screening exam initiated at 12:10 PM.  Appropriate orders placed.  Jessica Curry was informed that the remainder of the evaluation will be completed by another provider, this initial triage assessment does not replace that evaluation, and the importance of remaining in the ED until their evaluation is complete.     Lorayne Bender, PA-C 05/10/21 1213    Milton Ferguson, MD 05/12/21 [phone]

## 2021-05-10 NOTE — Sepsis Progress Note (Signed)
Notified bedside nurse of need to draw repeat lactic acid. 

## 2021-05-10 NOTE — ED Provider Notes (Signed)
[facility] DEPT Provider Note   CSN: [id number] Arrival date & time: 05/10/21  1141     History Chief Complaint  Patient presents with   Fever    Jessica Curry is a 70 y.o. female.  HPI     70 year old comes in a chief complaint of fever.  Patient has history of diabetes, hypertension and hyperlipidemia and recently was treated for UTI.  She had gone to her PCP with 2-1/2 days of fever.  T-max was over 102.  While there, patient was noted to have low blood pressure and sent to the ER.    Patient lives with her mother who is 15+-year-old.  She reports no URI-like symptoms, chest pain, cough, shortness of breath, abdominal pain, vomiting, diarrhea, burning with urination.  She is having new lower back pain, bilateral.  She also reports nausea and reduced p.o. intake  Patient denies any night sweats, unexplained weight loss, history of cancer.  She denies any COVID-19 exposures.   Past Medical History:  Diagnosis Date   Allergy    Asthma    Chicken pox    Depression    reasonable control on venlafaxine 75mg  TID. xanax #14 lasts about 2 years for anxiety portion.    Diabetes mellitus    Genital warts    no outbreaks since the 80s   History of heart artery stent 02/10/2020   Hyperlipidemia    Hypertension     Patient Active Problem List   Diagnosis Date Noted   Fever 05/10/2021   Alcohol use 02/07/2021   Chronic kidney disease (CKD) stage G3a/A1, moderately decreased glomerular filtration rate (GFR) between 45-59 mL/min/1.73 square meter and albuminuria creatinine ratio less than 30 mg/g (HCC) 02/07/2021   Chronic hyponatremia 02/07/2021   Bilateral renal cysts 02/06/2021   Chronic midline low back pain without sciatica 12/26/2020   Encounter for screening for lung cancer 02/10/2020   History of heart artery stent 02/10/2020   Mild intermittent asthma 08/06/2019   Serrated polyp of colon 08/12/2018   Simple chronic bronchitis ([place])  02/11/2018   Osteopenia 06/10/2017   Hyperlipidemia associated with type 2 diabetes mellitus ([place]) 06/04/2017   Diabetes mellitus type II, controlled ([place]) 09/10/2016   CAD (coronary artery disease), native coronary artery 09/10/2016   Hypertension associated with diabetes ([place]) 09/10/2016   Smoker 09/10/2016   Depression, major, recurrent, in partial remission ([place])     Past Surgical History:  Procedure Laterality Date   CORONARY ANGIOPLASTY WITH STENT PLACEMENT     2012, [place]   DILATION AND CURETTAGE OF UTERUS     TONSILLECTOMY  1968     OB History   No obstetric history on file.     Family History  Problem Relation Age of Onset   Arthritis Mother    Ovarian cancer Mother    AAA (abdominal aortic aneurysm) Mother    Kidney disease Mother    Hypothyroidism Mother    Other Father        does not know family history for father   Stroke Brother        retired MD. [employer]   Heart attack Brother    Arthritis Brother        hip replacement   Hyperlipidemia Brother    Hypertension Brother    Dementia Maternal Grandmother    Colon cancer Neg Hx    Esophageal cancer Neg Hx    Rectal cancer Neg Hx    Stomach cancer Neg Hx  Social History   Tobacco Use   Smoking status: Every Day    Packs/day: 0.30    Years: 48.00    Pack years: 14.40    Types: Cigarettes   Smokeless tobacco: Never   Tobacco comments:    handouts provided   Vaping Use   Vaping Use: Never used  Substance Use Topics   Alcohol use: Yes    Alcohol/week: 4.0 standard drinks    Types: 4 Cans of beer per week   Drug use: No    Home Medications Prior to Admission medications   Medication Sig Start Date End Date Taking? Authorizing Provider  albuterol (PROAIR HFA) 108 (90 Base) MCG/ACT inhaler Inhale 2 puffs into the lungs every 6 (six) hours as needed for wheezing or shortness of breath. 02/28/18  Yes Rigoberto Noel, MD  ALPRAZolam Duanne Moron) 0.5 MG tablet Take 1 tablet (0.5 mg total) by mouth  daily as needed for anxiety. Patient taking differently: Take 0.25-0.5 mg by mouth daily as needed for anxiety. 02/01/21  Yes Leamon Arnt, MD  amoxicillin (AMOXIL) 875 MG tablet Take 875 mg by mouth 2 (two) times daily. Start date : 05/01/21   Yes [provider]  aspirin 81 MG tablet Take 81 mg by mouth at bedtime.   Yes [provider]  atorvastatin (LIPITOR) 20 MG tablet Take 20 mg by mouth daily. 03/13/21  Yes [provider]  cloNIDine (CATAPRES) 0.1 MG tablet Take 1 tablet (0.1 mg total) by mouth 3 (three) times daily. 12/26/20  Yes Leamon Arnt, MD  diphenhydrAMINE (BENADRYL) 25 MG tablet Take 25 mg by mouth every 6 (six) hours as needed for allergies or sleep.   Yes [provider]  fluocinonide (LIDEX) 0.05 % external solution Apply 1 application topically daily. 03/23/21  Yes [provider]  lisinopril (ZESTRIL) 40 MG tablet Take 1 tablet (40 mg total) by mouth daily. 02/16/21  Yes Leamon Arnt, MD  meloxicam (MOBIC) 15 MG tablet Take 1 tablet (15 mg total) by mouth daily. 05/01/21  Yes Leamon Arnt, MD  metFORMIN (GLUCOPHAGE) 1000 MG tablet TAKE 1 TABLET BY MOUTH  TWICE DAILY WITH MEALS Patient taking differently: Take 1,000 mg by mouth 2 (two) times daily with a meal. 11/02/20  Yes Marin Olp, MD  Multiple Vitamins-Minerals (MULTIVITAMIN WITH MINERALS) tablet Take 1 tablet by mouth daily. Centrum silver for women   Yes [provider]  nitroGLYCERIN (NITROSTAT) 0.4 MG SL tablet Place 1 tablet (0.4 mg total) under the tongue every 5 (five) minutes as needed for chest pain. 02/11/18 05/10/21 Yes Burtis Junes, NP  venlafaxine XR (EFFEXOR-XR) 75 MG 24 hr capsule TAKE THREE CAPSULES BY MOUTH DAILY WITH BREAKFAST Patient taking differently: Take 75 mg by mouth 3 (three) times daily. 04/25/21  Yes Leamon Arnt, MD  COVID-19 mRNA Vac-TriS, Pfizer, (PFIZER-BIONT COVID-19 VAC-TRIS) SUSP injection Inject into the muscle. Patient  not taking: No sig reported 04/10/21   Carlyle Basques, MD  rosuvastatin (CRESTOR) 20 MG tablet Take 1 tablet (20 mg total) by mouth daily. Patient not taking: Reported on 05/10/2021 01/05/21   Leamon Arnt, MD    Allergies    Patient has no known allergies.  Review of Systems   Review of Systems  Constitutional:  Positive for activity change.  Respiratory:  Negative for shortness of breath.   Cardiovascular:  Negative for chest pain.  Gastrointestinal:  Positive for nausea. Negative for abdominal pain and vomiting.  Genitourinary:  Positive for flank pain. Negative for dysuria.  Allergic/Immunologic: Negative for immunocompromised state.  Hematological:  Does not bruise/bleed easily.  All other systems reviewed and are negative.  Physical Exam Updated Vital Signs BP (!) 166/90 (BP Location: Left Arm)   Pulse 79   Temp (!) 104.4 F (40.2 C) (Rectal)   Resp (!) 22   LMP  (LMP Unknown)   SpO2 94%   Physical Exam Vitals and nursing note reviewed.  Constitutional:      Appearance: She is well-developed.  HENT:     Head: Atraumatic.  Eyes:     Extraocular Movements: Extraocular movements intact.     Pupils: Pupils are equal, round, and reactive to light.  Cardiovascular:     Rate and Rhythm: Normal rate.  Pulmonary:     Effort: Pulmonary effort is normal.  Abdominal:     Tenderness: There is no abdominal tenderness.  Musculoskeletal:     Cervical back: Normal range of motion and neck supple.  Skin:    General: Skin is warm and dry.  Neurological:     Mental Status: She is alert and oriented to person, place, and time.    ED Results / Procedures / Treatments   Labs (all labs ordered are listed, but only abnormal results are displayed) Labs Reviewed  URINALYSIS, ROUTINE W REFLEX MICROSCOPIC - Abnormal; Notable for the following components:      Result Value   APPearance HAZY (*)    Protein, ur 100 (*)    Bacteria, UA RARE (*)    All other components within normal  limits  COMPREHENSIVE METABOLIC PANEL - Abnormal; Notable for the following components:   Sodium 127 (*)    Chloride 93 (*)    Glucose, Bld 184 (*)    BUN 24 (*)    Creatinine, Ser 1.73 (*)    GFR, Estimated 32 (*)    All other components within normal limits  CBC WITH DIFFERENTIAL/PLATELET - Abnormal; Notable for the following components:   Neutro Abs 9.6 (*)    Lymphs Abs 0.3 (*)    All other components within normal limits  LACTIC ACID, PLASMA - Abnormal; Notable for the following components:   Lactic Acid, Venous 2.3 (*)    All other components within normal limits  LACTIC ACID, PLASMA - Abnormal; Notable for the following components:   Lactic Acid, Venous 2.3 (*)    All other components within normal limits  C-REACTIVE PROTEIN - Abnormal; Notable for the following components:   CRP 25.1 (*)    All other components within normal limits  SEDIMENTATION RATE - Abnormal; Notable for the following components:   Sed Rate 68 (*)    All other components within normal limits  RESP PANEL BY RT-PCR (FLU A&B, COVID) [id number]  URINE CULTURE  CULTURE, BLOOD (ROUTINE X 2)  CULTURE, BLOOD (ROUTINE X 2)  PROTIME-INR  APTT  HEMOGLOBIN A1C  MAGNESIUM  PROCALCITONIN  TSH  PROTIME-INR  CORTISOL-AM, BLOOD  COMPREHENSIVE METABOLIC PANEL  CBC    EKG None  Radiology CT ABDOMEN PELVIS WO CONTRAST  Result Date: 05/10/2021 CLINICAL DATA:  Abdominal pain and fever EXAM: CT ABDOMEN AND PELVIS WITHOUT CONTRAST TECHNIQUE: Multidetector CT imaging of the abdomen and pelvis was performed following the standard protocol without IV contrast. COMPARISON:  Ultrasound 03/07/2021.  CT 02/06/2021. FINDINGS: Lower chest: Normal Hepatobiliary: Multiple pigment stones within the gallbladder. No CT evidence of cholecystitis or obstruction. No liver parenchymal abnormality seen. Pancreas: Normal.  Small duodenal diverticulum. Spleen:  Normal Adrenals/Urinary Tract: Adrenal gland on the left is normal. Low-density  lesion with punctate calcification of right adrenal gland is unchanged most consistent with an adenoma. Maximal dimension 1.9 cm. Bladder is normal. Few small renal cysts. No evidence stone or hydronephrosis. No evidence of swelling or edema. Stomach/Bowel: Stomach and small intestine appear normal. Mild diverticulosis of the left colon without CT evidence of diverticulitis. Redundant but unremarkable cecum. Vascular/Lymphatic: Aortic atherosclerosis. No aneurysm. IVC is normal. No retroperitoneal adenopathy. Reproductive: No pelvic mass. Other: No free fluid or air. Musculoskeletal: Scoliosis and chronic degenerative changes of the spine. IMPRESSION: No acute finding in the abdomen. Multiple pigment stones in the gallbladder but without CT evidence of cholecystitis or obstruction. No evidence of urinary tract inflammatory disease by CT. Right adrenal adenoma measuring 1.9 cm as seen previously. Chronic lumbar degenerative changes which could be painful. Electronically Signed   By: Nelson Chimes M.D.   On: 05/10/2021 16:03   CT LUMBAR SPINE WO CONTRAST  Result Date: 05/10/2021 CLINICAL DATA:  Low back pain.  No reported injury. EXAM: CT LUMBAR SPINE WITHOUT CONTRAST TECHNIQUE: Multidetector CT imaging of the lumbar spine was performed without intravenous contrast administration. Multiplanar CT image reconstructions were also generated. COMPARISON:  CT abdomen and pelvis 05/10/2021 and 02/06/2021. FINDINGS: Segmentation: 5 lumbar type vertebral bodies. Alignment: Normal alignment. Vertebrae: No vertebral compression deformities. No focal bone lesion or bone destruction. Bone cortex appears intact. Paraspinal and other soft tissues: No abnormal paraspinal soft tissue mass or infiltration. Incidental note of aortic vascular calcifications. Disc levels: Degenerative changes in the lumbar spine with disc space narrowing and endplate osteophyte formation. Degenerative changes throughout the facet joints. Most severe  degenerative changes are seen at L2-3, were there is complete loss of disc space, vacuum disc phenomenon, and sclerosis and lucency at the endplates. There appears to be at least moderate central canal stenosis at this level. Disc protrusion suggested at L3-4 and L4-5 causing effacement of the anterior thecal sac. IMPRESSION: Degenerative changes in the lumbar spine, most prominent at L2-3. Disc protrusion also suggested at L3-4 and L4-5. There is likely at least moderate central canal stenosis at L2-3. No acute displaced fractures identified. Electronically Signed   By: Lucienne Capers M.D.   On: 05/10/2021 17:32   DG Chest Port 1 View  Result Date: 05/10/2021 CLINICAL DATA:  Sepsis.  Fever of unknown origin for 2.5 days. EXAM: PORTABLE CHEST 1 VIEW COMPARISON:  09/14/2014 FINDINGS: The heart size and mediastinal contours are within normal limits. Both lungs are clear. The visualized skeletal structures are unremarkable. IMPRESSION: No active disease. Electronically Signed   By: Kerby Moors M.D.   On: 05/10/2021 14:02    Procedures .Critical Care  Date/Time: 05/10/2021 5:43 PM Performed by: Varney Biles, MD Authorized by: Varney Biles, MD   Critical care provider statement:    Critical care time (minutes):  52   Critical care was necessary to treat or prevent imminent or life-threatening deterioration of the following conditions:  Sepsis   Critical care was time spent personally by me on the following activities:  Discussions with consultants, evaluation of patient's response to treatment, examination of patient, ordering and performing treatments and interventions, ordering and review of laboratory studies, ordering and review of radiographic studies, pulse oximetry, re-evaluation of patient's condition, obtaining history from patient or surrogate and review of old charts   Medications Ordered in ED Medications  lactated ringers infusion (has no administration in time range)  aspirin  chewable tablet 81  mg (has no administration in time range)  atorvastatin (LIPITOR) tablet 20 mg (has no administration in time range)  ALPRAZolam (XANAX) tablet 0.5 mg (has no administration in time range)  venlafaxine XR (EFFEXOR-XR) 24 hr capsule 75 mg (has no administration in time range)  heparin injection 5,000 Units (has no administration in time range)  lactated ringers bolus 1,000 mL (1,000 mLs Intravenous New Bag/Given 05/10/21 1731)  lactated ringers infusion (has no administration in time range)  metroNIDAZOLE (FLAGYL) IVPB 500 mg (500 mg Intravenous New Bag/Given 05/10/21 1735)  acetaminophen (TYLENOL) tablet 650 mg (has no administration in time range)    Or  acetaminophen (TYLENOL) suppository 650 mg (has no administration in time range)  HYDROcodone-acetaminophen (NORCO/VICODIN) 5-325 MG per tablet 1-2 tablet (has no administration in time range)  ondansetron (ZOFRAN) tablet 4 mg ( Oral See Alternative 05/10/21 1730)    Or  ondansetron (ZOFRAN) injection 4 mg (4 mg Intravenous Given 05/10/21 1730)  insulin aspart (novoLOG) injection 0-15 Units (has no administration in time range)  insulin aspart (novoLOG) injection 0-5 Units (has no administration in time range)  ceFEPIme (MAXIPIME) 2 g in sodium chloride 0.9 % 100 mL IVPB (has no administration in time range)  vancomycin (VANCOREADY) IVPB 1250 mg/250 mL (has no administration in time range)  albuterol (PROVENTIL) (2.5 MG/3ML) 0.083% nebulizer solution 2.5 mg (has no administration in time range)  lactated ringers bolus 1,000 mL (0 mLs Intravenous Stopped 05/10/21 1658)  acetaminophen (TYLENOL) tablet 650 mg (650 mg Oral Given 05/10/21 1650)  ibuprofen (ADVIL) tablet 800 mg (800 mg Oral Given 05/10/21 1747)    ED Course  I have reviewed the triage vital signs and the nursing notes.  Pertinent labs & imaging results that were available during my care of the patient were reviewed by me and considered in my medical decision making  (see chart for details).  Clinical Course as of 05/10/21 1748  Wed May 10, 2021  1741 Patient suddenly spiked a fever.  She started having rigors. CT scan reviewed independently.  Cholelithiasis, which I do not think is causing the symptoms.  Hemodynamically remained stable.  Blood pressure has responded to IV fluid.  Will give Tylenol and admit.  COVID test is negative. [AN]  3818 Hospitalist was made aware that patient had not received vancomycin and Flagyl.  UA was pending at that time.  I am reviewing the chart now, it appears that they have added broader coverage given the UA looks clean. [AN]    Clinical Course User Index [AN] Varney Biles, MD   MDM Rules/Calculators/A&P                          70 year old female with history of diabetes comes in with chief complaint of fever.  Fever into 2 or 3 days, today at her PCP found to be hypotensive and sent to the ER. History is not clearly indicative of a source of infection.  Currently afebrile.  Blood pressure was slightly soft.  We will give her IV fluid.  Patient does indicate reduced p.o. intake secondary to her nausea.  She was recently treated for UTI but denies any burning with urination at this time.  She is having some lower back pain however.... For UTI still possible.  Other consideration from infection side include pyelonephritis, perinephric abscess, discitis, epidural abscess. On my exam, patient does not have midline spine tenderness, however, we will order sed rate and CRP.  If patient's  infection remains unclear then she might need work-up to look for source of infection in the spine potentially.  Appropriate labs sent patient started on fluid hydration.  Unclear diagnosis at this time.  Final Clinical Impression(s) / ED Diagnoses Final diagnoses:  Severe sepsis with acute organ dysfunction [facility])    Rx / DC Orders ED Discharge Orders     None        Varney Biles, MD 05/10/21 [phone]

## 2021-05-11 DIAGNOSIS — I10 Essential (primary) hypertension: Secondary | ICD-10-CM | POA: Diagnosis present

## 2021-05-11 DIAGNOSIS — R32 Unspecified urinary incontinence: Secondary | ICD-10-CM | POA: Diagnosis present

## 2021-05-11 DIAGNOSIS — Z87440 Personal history of urinary (tract) infections: Secondary | ICD-10-CM | POA: Diagnosis not present

## 2021-05-11 DIAGNOSIS — F17210 Nicotine dependence, cigarettes, uncomplicated: Secondary | ICD-10-CM | POA: Diagnosis present

## 2021-05-11 DIAGNOSIS — K8020 Calculus of gallbladder without cholecystitis without obstruction: Secondary | ICD-10-CM | POA: Diagnosis present

## 2021-05-11 DIAGNOSIS — E872 Acidosis: Secondary | ICD-10-CM | POA: Diagnosis present

## 2021-05-11 DIAGNOSIS — N179 Acute kidney failure, unspecified: Secondary | ICD-10-CM | POA: Diagnosis present

## 2021-05-11 DIAGNOSIS — M47816 Spondylosis without myelopathy or radiculopathy, lumbar region: Secondary | ICD-10-CM | POA: Diagnosis present

## 2021-05-11 DIAGNOSIS — F3341 Major depressive disorder, recurrent, in partial remission: Secondary | ICD-10-CM | POA: Diagnosis present

## 2021-05-11 DIAGNOSIS — Z8619 Personal history of other infectious and parasitic diseases: Secondary | ICD-10-CM | POA: Diagnosis present

## 2021-05-11 DIAGNOSIS — R509 Fever, unspecified: Secondary | ICD-10-CM | POA: Diagnosis present

## 2021-05-11 DIAGNOSIS — R6521 Severe sepsis with septic shock: Secondary | ICD-10-CM | POA: Diagnosis present

## 2021-05-11 DIAGNOSIS — A419 Sepsis, unspecified organism: Secondary | ICD-10-CM | POA: Diagnosis present

## 2021-05-11 DIAGNOSIS — Z6830 Body mass index (BMI) 30.0-30.9, adult: Secondary | ICD-10-CM | POA: Diagnosis not present

## 2021-05-11 DIAGNOSIS — G8929 Other chronic pain: Secondary | ICD-10-CM | POA: Diagnosis present

## 2021-05-11 DIAGNOSIS — E785 Hyperlipidemia, unspecified: Secondary | ICD-10-CM | POA: Diagnosis present

## 2021-05-11 DIAGNOSIS — Z20822 Contact with and (suspected) exposure to covid-19: Secondary | ICD-10-CM | POA: Diagnosis present

## 2021-05-11 DIAGNOSIS — M5126 Other intervertebral disc displacement, lumbar region: Secondary | ICD-10-CM | POA: Diagnosis present

## 2021-05-11 DIAGNOSIS — E871 Hypo-osmolality and hyponatremia: Secondary | ICD-10-CM | POA: Diagnosis present

## 2021-05-11 DIAGNOSIS — D3501 Benign neoplasm of right adrenal gland: Secondary | ICD-10-CM | POA: Diagnosis present

## 2021-05-11 DIAGNOSIS — I2510 Atherosclerotic heart disease of native coronary artery without angina pectoris: Secondary | ICD-10-CM | POA: Diagnosis present

## 2021-05-11 DIAGNOSIS — J4520 Mild intermittent asthma, uncomplicated: Secondary | ICD-10-CM | POA: Diagnosis present

## 2021-05-11 DIAGNOSIS — R6520 Severe sepsis without septic shock: Secondary | ICD-10-CM | POA: Diagnosis not present

## 2021-05-11 DIAGNOSIS — E669 Obesity, unspecified: Secondary | ICD-10-CM | POA: Diagnosis present

## 2021-05-11 DIAGNOSIS — E119 Type 2 diabetes mellitus without complications: Secondary | ICD-10-CM | POA: Diagnosis present

## 2021-05-11 DIAGNOSIS — E860 Dehydration: Secondary | ICD-10-CM | POA: Diagnosis present

## 2021-05-11 LAB — COMPREHENSIVE METABOLIC PANEL
ALT: 42 U/L (ref 0–44)
AST: 52 U/L — ABNORMAL HIGH (ref 15–41)
Albumin: 3.6 g/dL (ref 3.5–5.0)
Alkaline Phosphatase: 118 U/L (ref 38–126)
Anion gap: 12 (ref 5–15)
BUN: 19 mg/dL (ref 8–23)
CO2: 24 mmol/L (ref 22–32)
Calcium: 9.8 mg/dL (ref 8.9–10.3)
Chloride: 95 mmol/L — ABNORMAL LOW (ref 98–111)
Creatinine, Ser: 1.24 mg/dL — ABNORMAL HIGH (ref 0.44–1.00)
GFR, Estimated: 47 mL/min — ABNORMAL LOW (ref >60)
Glucose, Bld: 122 mg/dL — ABNORMAL HIGH (ref 70–99)
Potassium: 3.6 mmol/L (ref 3.5–5.1)
Sodium: 131 mmol/L — ABNORMAL LOW (ref 135–145)
Total Bilirubin: 0.4 mg/dL (ref 0.3–1.2)
Total Protein: 7.2 g/dL (ref 6.5–8.1)

## 2021-05-11 LAB — CBC
HCT: 38.8 % (ref 36.0–46.0)
Hemoglobin: 13.6 g/dL (ref 12.0–15.0)
MCH: 31.3 pg (ref 26.0–34.0)
MCHC: 35.1 g/dL (ref 30.0–36.0)
MCV: 89.2 fL (ref 80.0–100.0)
Platelets: 195 10*3/uL (ref 150–400)
RBC: 4.35 MIL/uL (ref 3.87–5.11)
RDW: 13.9 % (ref 11.5–15.5)
WBC: 8.4 10*3/uL (ref 4.0–10.5)
nRBC: 0 % (ref 0.0–0.2)

## 2021-05-11 LAB — GLUCOSE, CAPILLARY
Glucose-Capillary: 126 mg/dL — ABNORMAL HIGH (ref 70–99)
Glucose-Capillary: 140 mg/dL — ABNORMAL HIGH (ref 70–99)
Glucose-Capillary: 135 mg/dL — ABNORMAL HIGH (ref 70–99)
Glucose-Capillary: 122 mg/dL — ABNORMAL HIGH (ref 70–99)

## 2021-05-11 LAB — CORTISOL-AM, BLOOD: Cortisol - AM: 40 ug/dL — ABNORMAL HIGH (ref 6.7–22.6)

## 2021-05-11 LAB — PROTIME-INR
INR: 1 (ref 0.8–1.2)
Prothrombin Time: 13.1 seconds (ref 11.4–15.2)

## 2021-05-11 MED ORDER — VANCOMYCIN HCL 1250 MG/250ML IV SOLN
1250.0000 mg | INTRAVENOUS | Status: DC
  Administered 2021-05-11: 1250 mg via INTRAVENOUS
  Filled 2021-05-11: qty 250

## 2021-05-11 NOTE — Significant Event (Signed)
Rapid Response Event Note   Reason for Call :  Bedside RN found patient out of bed, alert, but with peri-orbital cyanosis and mottled extremities. Patient found to be febrile with low oxygen saturation and triggering "yellow MEWS score of 2".  Initial Focused Assessment:  By my arrival, patient back in bed, shivering, covered with blankets, O2 at 2L/via [place] in place, and minimal cyanosis visible. Lungs clear but diminished in bases, no abnormal heart sounds. Good peripheral pulses in all four extremities that are without edema. Vital signs: temp 101.6 F, BP 162/74, MAP 96, HR 82, RR 18, O2 sat 98% on 2 L/[place]. Patient denies pain or feelings of distress beyond feeling cold and wanting blankets over her.  Interventions:  Completed assessment. Bedside RN administering scheduled meds and prn dose Tylenol for fever. Educated patient that [facility] should be utilized with next elimination need to avoid desaturation or additional stress. Educated about safety in presence of infection and sepsis weakening her body.  Plan of Care:  Bedside RN will reassess vitals per MEWS protocol and monitor overall condition. MD to be notified if further distress or change in condition.    Event Summary:   MD Notified: deferred at this time, continue plan of care. Call Time: [place] Time: 4967 End Time: [place], RN

## 2021-05-11 NOTE — Progress Notes (Addendum)
Pt developed bad chills and complained of feeling extremely cold. Pts temp was 101.6. BP 188/88. Pt now in yellow MEWS. Pt became cyanotic and had blue lips and mottled skin. Pt c/o feeling a little dizzy. SpO2 had poor connection but read 80s on room air. Placed pt on 3LNC. Charge nurse Tom called to room, Rapid Response called and M. Sharlet Salina NP notified. Rapid response nurse Estill Bamberg RN at bedside. Pt now 97% on 2LNC BP 162/74. Color returned to skin and lips. Pt denies feeling SOB. PRN tylenol given. No new orders were given from M. Sharlet Salina NP.

## 2021-05-11 NOTE — Progress Notes (Signed)
   05/11/21 0528  Assess: MEWS Score  Temp (!) 101.6 F (38.7 C)  BP (!) 188/88  Pulse Rate 96  SpO2 91 %  O2 Device Nasal Cannula  O2 Flow Rate (L/min) 3 L/min  Assess: MEWS Score  MEWS Temp 2  MEWS Systolic 0  MEWS Pulse 0  MEWS RR 0  MEWS LOC 0  MEWS Score 2  MEWS Score Color Yellow  Assess: if the MEWS score is Yellow or Red  Were vital signs taken at a resting state? Yes  Focused Assessment Change from prior assessment (see assessment flowsheet) (Pt cyanotic and mottled skin)  Does the patient meet 2 or more of the SIRS criteria? Yes  Does the patient have a confirmed or suspected source of infection? Yes  Provider and Rapid Response Notified? Yes  MEWS guidelines implemented *See Row Information* Yes  Treat  MEWS Interventions Administered prn meds/treatments;Escalated (See documentation below)  Pain Scale 0-10  Pain Score 0  Take Vital Signs  Increase Vital Sign Frequency  Yellow: Q 2hr X 2 then Q 4hr X 2, if remains yellow, continue Q 4hrs  Escalate  MEWS: Escalate Yellow: discuss with charge nurse/RN and consider discussing with provider and RRT  Notify: Charge Nurse/RN  Name of Charge Nurse/RN Notified Tom, RN  Date Charge Nurse/RN Notified 05/11/21  Time Charge Nurse/RN Notified 0537  Notify: Provider  Provider Name/Title M. Sharlet Salina  Date Provider Notified 05/11/21  Time Provider Notified [phone]  Notification Type Page  Notification Reason Change in status (MEWS yellow, cyanotic)  Provider response No new orders  Notify: Rapid Response  Name of Rapid Response RN Notified Estill Bamberg RN  Date Rapid Response Notified 05/11/21  Time Rapid Response Notified 0538  Document  Patient Outcome Stabilized after interventions  Progress note created (see row info) Yes  Assess: SIRS CRITERIA  SIRS Temperature  1  SIRS Pulse 1  SIRS Respirations  0  SIRS WBC 0  SIRS Score Sum  2

## 2021-05-11 NOTE — Progress Notes (Signed)
PROGRESS NOTE    Jessica Curry  HYI:[id number] DOB: [birth year] DOA: 05/10/2021 PCP: Leamon Arnt, MD   Chief Complain: Fever, low blood pressure  Brief Narrative: Patient is a 70 year old female with history of allergies, asthma, depression, diabetes mellitus, hyperlipidemia, hypertension who was sent from PCPs office for the evaluation of fever, low blood pressure.  She was diagnosed with UTI and was prescribed amoxicillin .  Patient became febrile and was seen by PCP and was sent for evaluation here.  She was also complaining of back pain.  On presentation she was hypotensive, febrile up to 104 F.  Lactic acid was elevated, she had AKI, leukocytosis.  Sodium was 127.  She was admitted for the management of severe sepsis, started on broad-spectrum antibiotics, culture sent.  Assessment & Plan:   Active Problems:   Diabetes mellitus type II, controlled ([place])   CAD (coronary artery disease), native coronary artery   Depression, major, recurrent, in partial remission ([facility])   Mild intermittent asthma   Chronic midline low back pain without sciatica   Fever  Severe sepsis/septic shock: Presented with high-grade fever, AKI, lactic acidosis, leukocytosis, hypotension.  Started on aggressive fluid resuscitation, broad-spectrum antibiotics, culture sent.  Currently hemodynamically stable.  No clear source of sepsis identified.  Urine not suggestive UTI, chest x-ray did not show pneumonia.  Her urine culture on 05/01/2021 showed pansensitive E. coli. CT abdomen/pelvis did not show  acute finding in the abdomen, showed multiple pigment stones in the gallbladder but without CT evidence of cholecystitis or obstruction.  There was not no evidence of urinary tract inflammatory disease  Right adrenal adenoma: Measuring 1.9 cm as per CT, stable on comparison to previous images.  Monitor as an outpatient  Back pain:Degenerative changes in the lumbar spine, most prominent at L2-3. Disc protrusion  at  L3-4 and L4-5, least moderate central canal stenosis at L2-3. No acute displaced.No fractures identified.  Diabetes type 2: On metformin at home.  Continue sliding scale insulin here, monitor blood sugars  AKI: Secondary to dehydration, hypotension.  Kidney function improving with IV fluids.  Monitor BMP  History of coronary artery disease: Denies any chest pain.  On aspirin at home.  Mild intermittent asthma: Currently on exertion.  Home medications resumed  Hyponatremia: Most likely secondary to dehydration.  Sodium level has improved with IV fluids.  Obesity: BMI of 30         DVT prophylaxis: Heparin [place] I Code Status: Full Family Communication: None at bedside Status is: Observation  The patient remains OBS appropriate and will d/c before 2 midnights.  Dispo: The patient is from: Home              Anticipated d/c is to: Home              Patient currently is not medically stable to d/c.   Difficult to place patient No     Consultants: None  Procedures:None  Antimicrobials:  Anti-infectives (From admission, onward)    Start     Dose/Rate Route Frequency Ordered Stop   05/11/21 0600  ceFEPIme (MAXIPIME) 2 g in sodium chloride 0.9 % 100 mL IVPB  Status:  Discontinued        2 g 200 mL/hr over 30 Minutes Intravenous Every 12 hours 05/10/21 1657 05/10/21 1700   05/10/21 1800  vancomycin (VANCOREADY) IVPB 1250 mg/250 mL        1,250 mg 166.7 mL/hr over 90 Minutes Intravenous Every 48 hours 05/10/21 1705  05/10/21 1730  ceFEPIme (MAXIPIME) 1 g in sodium chloride 0.9 % 100 mL IVPB  Status:  Discontinued        1 g 200 mL/hr over 30 Minutes Intravenous  Once 05/10/21 1654 05/10/21 1659   05/10/21 1700  ceFEPIme (MAXIPIME) 2 g in sodium chloride 0.9 % 100 mL IVPB        2 g 200 mL/hr over 30 Minutes Intravenous Every 12 hours 05/10/21 1700     05/10/21 1700  ceFEPIme (MAXIPIME) 2 g in sodium chloride 0.9 % 100 mL IVPB  Status:  Discontinued        2 g 200 mL/hr over  30 Minutes Intravenous  Once 05/10/21 1647 05/10/21 1700   05/10/21 1700  metroNIDAZOLE (FLAGYL) IVPB 500 mg        500 mg 100 mL/hr over 60 Minutes Intravenous Every 8 hours 05/10/21 1647     05/10/21 1700  vancomycin (VANCOCIN) IVPB 1000 mg/200 mL premix  Status:  Discontinued        1,000 mg 200 mL/hr over 60 Minutes Intravenous  Once 05/10/21 1647 05/10/21 1704   05/10/21 1445  cefTRIAXone (ROCEPHIN) 1 g in sodium chloride 0.9 % 100 mL IVPB  Status:  Discontinued        1 g 200 mL/hr over 30 Minutes Intravenous Every 24 hours 05/10/21 1439 05/10/21 1658       Subjective:  Patient seen and examined at the bedside this morning.  Hemodynamically stable.  During my evaluation, she was comfortable and denied any complaints except for urinary incontinence.  She says she wants to go home.  She was febrile earlier this morning.  She denies any dysuria, chest pain, shortness of breath.  Blood pressure has been stable now.   Objective: Vitals:   05/11/21 0528 05/11/21 0540 05/11/21 0657 05/11/21 0746  BP: (!) 188/88 (!) 162/74  140/63  Pulse: 96 82  (!) 59  Resp:  18  18  Temp: (!) 101.6 F (38.7 C)  99.6 F (37.6 C) 99.2 F (37.3 C)  TempSrc:   Oral Oral  SpO2: 91% 98%      Intake/Output Summary (Last 24 hours) at 05/11/2021 0750 Last data filed at 05/11/2021 0300 Gross per 24 hour  Intake 2198.66 ml  Output --  Net 2198.66 ml   There were no vitals filed for this visit.  Examination:  General exam: Appears calm and comfortable ,Not in distress,average built HEENT:PERRL,Oral mucosa moist, Ear/Nose normal on gross exam Respiratory system: Bilateral equal air entry, normal vesicular breath sounds, no wheezes or crackles  Cardiovascular system: S1 & S2 heard, RRR. No JVD, murmurs, rubs, gallops or clicks. No pedal edema. Gastrointestinal system: Abdomen is nondistended, soft and nontender. No organomegaly or masses felt. Normal bowel sounds heard. Central nervous system: Alert  and oriented. No focal neurological deficits. Extremities: No edema, no clubbing ,no cyanosis Skin: No rashes, lesions or ulcers,no icterus ,no pallor   Data Reviewed: I have personally reviewed following labs and imaging studies  CBC: Recent Labs  Lab 05/10/21 1238 05/11/21 0523  WBC 10.2 8.4  NEUTROABS 9.6*  --   HGB 13.0 13.6  HCT 38.0 38.8  MCV 89.4 89.2  PLT 218 353   Basic Metabolic Panel: Recent Labs  Lab 05/10/21 1238 05/10/21 1949 05/11/21 0523  NA 127*  --  131*  K 3.9  --  3.6  CL 93*  --  95*  CO2 25  --  24  GLUCOSE 184*  --  122*  BUN 24*  --  19  CREATININE 1.73*  --  1.24*  CALCIUM 9.8  --  9.8  MG  --  1.6*  --    GFR: Estimated Creatinine Clearance: 48.5 mL/min (A) (by C-G formula based on SCr of 1.24 mg/dL (H)). Liver Function Tests: Recent Labs  Lab 05/10/21 1238 05/11/21 0523  AST 41 52*  ALT 38 42  ALKPHOS 111 118  BILITOT 0.5 0.4  PROT 7.2 7.2  ALBUMIN 3.8 3.6   No results for input(s): LIPASE, AMYLASE in the last 168 hours. No results for input(s): AMMONIA in the last 168 hours. Coagulation Profile: Recent Labs  Lab 05/10/21 1326 05/11/21 0523  INR 1.1 1.0   Cardiac Enzymes: No results for input(s): CKTOTAL, CKMB, CKMBINDEX, TROPONINI in the last 168 hours. BNP (last 3 results) No results for input(s): PROBNP in the last 8760 hours. HbA1C: No results for input(s): HGBA1C in the last 72 hours. CBG: Recent Labs  Lab 05/10/21 1758 05/10/21 2135 05/11/21 0741  GLUCAP 145* 155* 126*   Lipid Profile: No results for input(s): CHOL, HDL, LDLCALC, TRIG, CHOLHDL, LDLDIRECT in the last 72 hours. Thyroid Function Tests: Recent Labs    05/10/21 1929  TSH 1.971   Anemia Panel: No results for input(s): VITAMINB12, FOLATE, FERRITIN, TIBC, IRON, RETICCTPCT in the last 72 hours. Sepsis Labs: Recent Labs  Lab 05/10/21 1326 05/10/21 1507 05/10/21 1949  PROCALCITON  --   --  0.58  LATICACIDVEN 2.3* 2.3* 1.2    Recent  Results (from the past 240 hour(s))  Urine Culture     Status: Abnormal   Collection Time: 05/01/21  4:23 PM   Specimen: Urine  Result Value Ref Range Status   MICRO NUMBER: [id number]  Final   SPECIMEN QUALITY: Adequate  Final   Sample Source NOT GIVEN  Final   STATUS: FINAL  Final   ISOLATE 1: Escherichia coli (A)  Final    Comment: Greater than 100,000 CFU/mL of Escherichia coli      Susceptibility   Escherichia coli - URINE CULTURE, REFLEX    AMOX/CLAVULANIC <=2 Sensitive     AMPICILLIN <=2 Sensitive     AMPICILLIN/SULBACTAM <=2 Sensitive     CEFAZOLIN* <=4 Not Reportable      * For infections other than uncomplicated UTIcaused by E. coli, K. pneumoniae or P. mirabilis:Cefazolin is resistant if MIC > or = 8 mcg/mL.(Distinguishing susceptible versus intermediatefor isolates with MIC < or = 4 mcg/mL requiresadditional testing.)For uncomplicated UTI caused by E. coli,K. pneumoniae or P. mirabilis: Cefazolin issusceptible if MIC <32 mcg/mL and predictssusceptible to the oral agents cefaclor, cefdinir,cefpodoxime, cefprozil, cefuroxime, cephalexinand loracarbef.    CEFEPIME <=1 Sensitive     CEFTRIAXONE <=1 Sensitive     CIPROFLOXACIN <=0.25 Sensitive     LEVOFLOXACIN <=0.12 Sensitive     ERTAPENEM <=0.5 Sensitive     GENTAMICIN <=1 Sensitive     IMIPENEM <=0.25 Sensitive     NITROFURANTOIN <=16 Sensitive     PIP/TAZO <=4 Sensitive     TOBRAMYCIN <=1 Sensitive     TRIMETH/SULFA* <=20 Sensitive      * For infections other than uncomplicated UTIcaused by E. coli, K. pneumoniae or P. mirabilis:Cefazolin is resistant if MIC > or = 8 mcg/mL.(Distinguishing susceptible versus intermediatefor isolates with MIC < or = 4 mcg/mL requiresadditional testing.)For uncomplicated UTI caused by E. coli,K. pneumoniae or P. mirabilis: Cefazolin issusceptible if MIC <32 mcg/mL and predictssusceptible to the oral agents cefaclor, cefdinir,cefpodoxime, cefprozil, cefuroxime, cephalexinand  loracarbef.Legend:S =  Susceptible  I = IntermediateR = Resistant  NS = Not susceptible* = Not tested  NR = Not reported**NN = See antimicrobic comments  Blood Culture (routine x 2)     Status: None (Preliminary result)   Collection Time: 05/10/21  1:12 PM   Specimen: BLOOD RIGHT FOREARM  Result Value Ref Range Status   Specimen Description   Final    BLOOD RIGHT FOREARM Performed at [facility] [address], [place], [place] [address]    Special Requests   Final    BOTTLES DRAWN AEROBIC AND ANAEROBIC Blood Culture results may not be optimal due to an inadequate volume of blood received in culture bottles Performed at [facility] [address] [place], [place] [address]    Culture   Final    NO GROWTH < 12 HOURS Performed at [facility] [address], [place], [place] [address]    Report Status PENDING  Incomplete  Resp Panel by RT-PCR (Flu A&B, Covid) Nasopharyngeal Swab     Status: None   Collection Time: 05/10/21  1:26 PM   Specimen: Nasopharyngeal Swab; Nasopharyngeal(NP) swabs in vial transport medium  Result Value Ref Range Status   SARS Coronavirus 2 by RT PCR NEGATIVE NEGATIVE Final    Comment: (NOTE) SARS-CoV-2 target nucleic acids are NOT DETECTED.  The SARS-CoV-2 RNA is generally detectable in upper respiratory specimens during the acute phase of infection. The lowest concentration of SARS-CoV-2 viral copies this assay can detect is 138 copies/mL. A negative result does not preclude SARS-Cov-2 infection and should not be used as the sole basis for treatment or other patient management decisions. A negative result may occur with  improper specimen collection/handling, submission of specimen other than nasopharyngeal swab, presence of viral mutation(s) within the areas targeted by this assay, and inadequate number of viral copies(<138 copies/mL). A negative result must be combined with clinical observations, patient history, and  epidemiological information. The expected result is Negative.  Fact Sheet for Patients:  EntrepreneurPulse.com.au  Fact Sheet for Healthcare Providers:  IncredibleEmployment.be  This test is no t yet approved or cleared by the [place] FDA and  has been authorized for detection and/or diagnosis of SARS-CoV-2 by FDA under an Emergency Use Authorization (EUA). This EUA will remain  in effect (meaning this test can be used) for the duration of the COVID-19 declaration under Section 564(b)(1) of the Act, 21 U.S.C.section 360bbb-3(b)(1), unless the authorization is terminated  or revoked sooner.       Influenza A by PCR NEGATIVE NEGATIVE Final   Influenza B by PCR NEGATIVE NEGATIVE Final    Comment: (NOTE) The Xpert Xpress SARS-CoV-2/FLU/RSV plus assay is intended as an aid in the diagnosis of influenza from Nasopharyngeal swab specimens and should not be used as a sole basis for treatment. Nasal washings and aspirates are unacceptable for Xpert Xpress SARS-CoV-2/FLU/RSV testing.  Fact Sheet for Patients: EntrepreneurPulse.com.au  Fact Sheet for Healthcare Providers: IncredibleEmployment.be  This test is not yet approved or cleared by the [place] FDA and has been authorized for detection and/or diagnosis of SARS-CoV-2 by FDA under an Emergency Use Authorization (EUA). This EUA will remain in effect (meaning this test can be used) for the duration of the COVID-19 declaration under Section 564(b)(1) of the Act, 21 U.S.C. section 360bbb-3(b)(1), unless the authorization is terminated or revoked.  Performed at [facility], [place] [address], [place], [place] [address]   Blood Culture (routine  x 2)     Status: None (Preliminary result)   Collection Time: 05/10/21  1:26 PM   Specimen: BLOOD  Result Value Ref Range Status   Specimen Description   Final    BLOOD BLOOD RIGHT  FOREARM Performed at [facility] [address] [place], [place] [address]    Special Requests   Final    BOTTLES DRAWN AEROBIC AND ANAEROBIC Blood Culture results may not be optimal due to an inadequate volume of blood received in culture bottles Performed at [facility] [address], [place], [place] [address]    Culture   Final    NO GROWTH < 12 HOURS Performed at [facility] [address], [place],  [address]    Report Status PENDING  Incomplete         Radiology Studies: CT ABDOMEN PELVIS WO CONTRAST  Result Date: 05/10/2021 CLINICAL DATA:  Abdominal pain and fever EXAM: CT ABDOMEN AND PELVIS WITHOUT CONTRAST TECHNIQUE: Multidetector CT imaging of the abdomen and pelvis was performed following the standard protocol without IV contrast. COMPARISON:  Ultrasound 03/07/2021.  CT 02/06/2021. FINDINGS: Lower chest: Normal Hepatobiliary: Multiple pigment stones within the gallbladder. No CT evidence of cholecystitis or obstruction. No liver parenchymal abnormality seen. Pancreas: Normal.  Small duodenal diverticulum. Spleen: Normal Adrenals/Urinary Tract: Adrenal gland on the left is normal. Low-density lesion with punctate calcification of right adrenal gland is unchanged most consistent with an adenoma. Maximal dimension 1.9 cm. Bladder is normal. Few small renal cysts. No evidence stone or hydronephrosis. No evidence of swelling or edema. Stomach/Bowel: Stomach and small intestine appear normal. Mild diverticulosis of the left colon without CT evidence of diverticulitis. Redundant but unremarkable cecum. Vascular/Lymphatic: Aortic atherosclerosis. No aneurysm. IVC is normal. No retroperitoneal adenopathy. Reproductive: No pelvic mass. Other: No free fluid or air. Musculoskeletal: Scoliosis and chronic degenerative changes of the spine. IMPRESSION: No acute finding in the abdomen. Multiple pigment stones in the gallbladder but without  CT evidence of cholecystitis or obstruction. No evidence of urinary tract inflammatory disease by CT. Right adrenal adenoma measuring 1.9 cm as seen previously. Chronic lumbar degenerative changes which could be painful. Electronically Signed   By: Nelson Chimes M.D.   On: 05/10/2021 16:03   CT LUMBAR SPINE WO CONTRAST  Result Date: 05/10/2021 CLINICAL DATA:  Low back pain.  No reported injury. EXAM: CT LUMBAR SPINE WITHOUT CONTRAST TECHNIQUE: Multidetector CT imaging of the lumbar spine was performed without intravenous contrast administration. Multiplanar CT image reconstructions were also generated. COMPARISON:  CT abdomen and pelvis 05/10/2021 and 02/06/2021. FINDINGS: Segmentation: 5 lumbar type vertebral bodies. Alignment: Normal alignment. Vertebrae: No vertebral compression deformities. No focal bone lesion or bone destruction. Bone cortex appears intact. Paraspinal and other soft tissues: No abnormal paraspinal soft tissue mass or infiltration. Incidental note of aortic vascular calcifications. Disc levels: Degenerative changes in the lumbar spine with disc space narrowing and endplate osteophyte formation. Degenerative changes throughout the facet joints. Most severe degenerative changes are seen at L2-3, were there is complete loss of disc space, vacuum disc phenomenon, and sclerosis and lucency at the endplates. There appears to be at least moderate central canal stenosis at this level. Disc protrusion suggested at L3-4 and L4-5 causing effacement of the anterior thecal sac. IMPRESSION: Degenerative changes in the lumbar spine, most prominent at L2-3. Disc protrusion also suggested at L3-4 and L4-5. There is likely at least moderate central canal stenosis at L2-3. No acute displaced fractures  identified. Electronically Signed   By: Lucienne Capers M.D.   On: 05/10/2021 17:32   DG Chest Port 1 View  Result Date: 05/10/2021 CLINICAL DATA:  Sepsis.  Fever of unknown origin for 2.5 days. EXAM:  PORTABLE CHEST 1 VIEW COMPARISON:  09/14/2014 FINDINGS: The heart size and mediastinal contours are within normal limits. Both lungs are clear. The visualized skeletal structures are unremarkable. IMPRESSION: No active disease. Electronically Signed   By: Kerby Moors M.D.   On: 05/10/2021 14:02        Scheduled Meds:  aspirin  81 mg Oral QHS   atorvastatin  20 mg Oral Daily   heparin  5,000 Units Subcutaneous Q8H   insulin aspart  0-15 Units Subcutaneous TID WC   insulin aspart  0-5 Units Subcutaneous QHS   venlafaxine XR  75 mg Oral TID   Continuous Infusions:  ceFEPime (MAXIPIME) IV 2 g (05/11/21 0557)   lactated ringers 125 mL/hr at 05/11/21 0555   metronidazole 500 mg (05/11/21 0023)   vancomycin 1,250 mg (05/10/21 2004)     LOS: 0 days    Time spent: 35 mins.More than 50% of that time was spent in counseling and/or coordination of care.      Shelly Coss, MD [facility] P6/16/2022, 7:50 AM

## 2021-05-11 NOTE — Progress Notes (Signed)
Pharmacy Antibiotic Note  Jessica Curry is a 70 y.o. female admitted on 05/10/2021 with  fever .  Pharmacy has been consulted for cefepime and vancomycin dosing. 05/11/2021 D#2 vanc/cefepime/flagyl WBC WNL, SCr 1.73> 1.24, CrCl ~ 49 ml/min.   Plan: Continue Flagyl 500 mg IV every 8 hours per MD Continue Cefepime 2 g IV every 12 hours Change to Vancomycin 1250 mg IV every 24 hours (Goal AUC 400-550, Expected AUC: 512.2, SCr used: 1.24) Monitor clinical picture, renal function, vancomycin levels if indicated F/U C&S, abx deescalation / LOT   Temp (24hrs), Avg:100.2 F (37.9 C), Min:97.8 F (36.6 C), Max:104.4 F (40.2 C)  Recent Labs  Lab 05/10/21 1238 05/10/21 1326 05/10/21 1507 05/10/21 1949 05/11/21 0523  WBC 10.2  --   --   --  8.4  CREATININE 1.73*  --   --   --  1.24*  LATICACIDVEN  --  2.3* 2.3* 1.2  --      Estimated Creatinine Clearance: 48.5 mL/min (A) (by C-G formula based on SCr of 1.24 mg/dL (H)).    No Known Allergies  Antimicrobials this admission: 6/15 ceftriaxone x 1 6/15 cefepime >>  6/15 vancomycin >>  6/15 flagyl>>  Dose adjustments this admission: 6/16 V 1250 q48> 1250 q24   Microbiology results: 6/15 BCx2 ngtd 6/15 UCx: sent  PTA 6/6 UCx E coli. Pan sensitive - Rx w/ amox PTA  Thank you for allowing pharmacy to be a part of this patient's care.  Eudelia Bunch, Pharm.D 05/11/2021 10:41 AM

## 2021-05-12 DIAGNOSIS — R6520 Severe sepsis without septic shock: Secondary | ICD-10-CM

## 2021-05-12 DIAGNOSIS — A419 Sepsis, unspecified organism: Secondary | ICD-10-CM | POA: Diagnosis not present

## 2021-05-12 LAB — CBC WITH DIFFERENTIAL/PLATELET
Abs Immature Granulocytes: 0.07 10*3/uL (ref 0.00–0.07)
Basophils Absolute: 0 10*3/uL (ref 0.0–0.1)
Basophils Relative: 0 %
Eosinophils Absolute: 0 10*3/uL (ref 0.0–0.5)
Eosinophils Relative: 0 %
HCT: 31.7 % — ABNORMAL LOW (ref 36.0–46.0)
Hemoglobin: 11.2 g/dL — ABNORMAL LOW (ref 12.0–15.0)
Immature Granulocytes: 2 %
Lymphocytes Relative: 9 %
Lymphs Abs: 0.4 10*3/uL — ABNORMAL LOW (ref 0.7–4.0)
MCH: 30.9 pg (ref 26.0–34.0)
MCHC: 35.3 g/dL (ref 30.0–36.0)
MCV: 87.3 fL (ref 80.0–100.0)
Monocytes Absolute: 0.3 10*3/uL (ref 0.1–1.0)
Monocytes Relative: 6 %
Neutro Abs: 3.7 10*3/uL (ref 1.7–7.7)
Neutrophils Relative %: 83 %
Platelets: 171 10*3/uL (ref 150–400)
RBC: 3.63 MIL/uL — ABNORMAL LOW (ref 3.87–5.11)
RDW: 13.8 % (ref 11.5–15.5)
WBC: 4.5 10*3/uL (ref 4.0–10.5)
nRBC: 0 % (ref 0.0–0.2)

## 2021-05-12 LAB — BASIC METABOLIC PANEL
Anion gap: 9 (ref 5–15)
BUN: 12 mg/dL (ref 8–23)
CO2: 25 mmol/L (ref 22–32)
Calcium: 9.1 mg/dL (ref 8.9–10.3)
Chloride: 95 mmol/L — ABNORMAL LOW (ref 98–111)
Creatinine, Ser: 1.09 mg/dL — ABNORMAL HIGH (ref 0.44–1.00)
GFR, Estimated: 55 mL/min — ABNORMAL LOW (ref >60)
Glucose, Bld: 101 mg/dL — ABNORMAL HIGH (ref 70–99)
Potassium: 3 mmol/L — ABNORMAL LOW (ref 3.5–5.1)
Sodium: 129 mmol/L — ABNORMAL LOW (ref 135–145)

## 2021-05-12 LAB — HEMOGLOBIN A1C
Hgb A1c MFr Bld: 6.6 % — ABNORMAL HIGH (ref 4.8–5.6)
Mean Plasma Glucose: 143 mg/dL

## 2021-05-12 LAB — MAGNESIUM: Magnesium: 1.4 mg/dL — ABNORMAL LOW (ref 1.7–2.4)

## 2021-05-12 LAB — URINE CULTURE

## 2021-05-12 LAB — GLUCOSE, CAPILLARY: Glucose-Capillary: 128 mg/dL — ABNORMAL HIGH (ref 70–99)

## 2021-05-12 MED ORDER — MAGNESIUM OXIDE 400 MG PO TABS: 400.0000 mg | ORAL_TABLET | Freq: Every day | ORAL | 0 refills | Status: AC

## 2021-05-12 MED ORDER — POTASSIUM CHLORIDE CRYS ER 20 MEQ PO TBCR: 40.0000 meq | EXTENDED_RELEASE_TABLET | Freq: Every day | ORAL | 0 refills | Status: DC

## 2021-05-12 MED ORDER — MAGNESIUM SULFATE 4 GM/100ML IV SOLN
4.0000 g | Freq: Once | INTRAVENOUS | Status: AC
  Administered 2021-05-12: 4 g via INTRAVENOUS
  Filled 2021-05-12: qty 100

## 2021-05-12 MED ORDER — AMOXICILLIN-POT CLAVULANATE 500-125 MG PO TABS
1.0000 | ORAL_TABLET | Freq: Three times a day (TID) | ORAL | Status: DC
  Administered 2021-05-12: 500 mg via ORAL
  Filled 2021-05-12 (×2): qty 1

## 2021-05-12 MED ORDER — POTASSIUM CHLORIDE CRYS ER 10 MEQ PO TBCR
40.0000 meq | EXTENDED_RELEASE_TABLET | ORAL | Status: AC
  Administered 2021-05-12 (×2): 40 meq via ORAL
  Filled 2021-05-12 (×2): qty 4

## 2021-05-12 MED ORDER — AMOXICILLIN-POT CLAVULANATE 875-125 MG PO TABS: 1.0000 | ORAL_TABLET | Freq: Two times a day (BID) | ORAL | 0 refills | Status: AC

## 2021-05-12 NOTE — Discharge Summary (Signed)
Physician Discharge Summary  RAEVEN PINT WUJ:[id number] DOB: Aug 12, 1951 DOA: 05/10/2021  PCP: Leamon Arnt, MD  Admit date: 05/10/2021 Discharge date: 05/12/2021  Admitted From: Home Disposition:  Home  Discharge Condition:Stable CODE STATUS:FULL Diet recommendation: Heart Healthy   Brief/Interim Summary: Patient is a 70 year old female with history of allergies, asthma, depression, diabetes mellitus, hyperlipidemia, hypertension who was sent from PCPs office for the evaluation of fever, low blood pressure.  She was diagnosed with UTI and was prescribed amoxicillin .  Patient became febrile and was seen by PCP and was sent for evaluation here.  She was also complaining of back pain.  On presentation she was hypotensive, febrile up to 104 F.  Lactic acid was elevated, she had AKI, leukocytosis.  Sodium was 127.  She was admitted for the management of severe sepsis, started on broad-spectrum antibiotics, culture sent.  Patient's overall condition remarkably improved within a short time.  Her blood cultures have not shown any growth.  Urine culture showed multiple species.  Currently she is hemodynamically stable, afebrile.  She is medically stable for discharge home today with oral antibiotic.  Following problems were addressed during her hospitalization:    Severe sepsis/septic shock: Presented with high-grade fever, AKI, lactic acidosis, leukocytosis, hypotension.  Started on aggressive fluid resuscitation, broad-spectrum antibiotics, culture sent.  Currently hemodynamically stable.  No clear source of sepsis identified.  Urine not suggestive UTI, chest x-ray did not show pneumonia.  Her urine culture on 05/01/2021 showed pansensitive E. coli. CT abdomen/pelvis did not show  acute finding in the abdomen, showed multiple pigment stones in the gallbladder but without CT evidence of cholecystitis or obstruction.  There was not no evidence of urinary tract inflammatory disease.  Her blood  cultures have not shown any growth.  Urine culture showed multiple species.  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  Monitor as an outpatient   Back pain:Degenerative changes in the lumbar spine, most prominent at L2-3. Disc protrusion  at L3-4 and L4-5, least moderate central canal stenosis at L2-3. No acute displaced.No fractures identified.  She denies any problem with ambulation.   Diabetes type 2: On metformin at home.     AKI: Resolved with IV fluids  History of coronary artery disease: Denies any chest pain.  On aspirin at home.   Mild intermittent asthma: Currently not in exacerbation.  Continue home medications  Hyponatremia: Stable at 129.  Her chart review reveals that her sodium level has remained between 125-132, monitor as an outpatient  Obesity: BMI of 30      Discharge Diagnoses:  Active Problems:   Diabetes mellitus type II, controlled ([place])   CAD (coronary artery disease), native coronary artery   Depression, major, recurrent, in partial remission ([facility])   Mild intermittent asthma   Chronic midline low back pain without sciatica   Fever   Severe sepsis [facility])    Discharge Instructions  Discharge Instructions     Diet - low sodium heart healthy   Complete by: As directed    Discharge instructions   Complete by: As directed    1)Please take prescribed medications as instructed. 2)Follow up with your PCP in a week   Increase activity slowly   Complete by: As directed       Allergies as of 05/12/2021   No Known Allergies      Medication List     STOP taking  these medications    amoxicillin 875 MG tablet Commonly known as: AMOXIL   Pfizer-BioNT COVID-19 Vac-TriS Susp injection Generic drug: COVID-19 mRNA Vac-TriS (Pfizer)   rosuvastatin 20 MG tablet Commonly known as: CRESTOR       TAKE these medications    albuterol 108 (90 Base)  MCG/ACT inhaler Commonly known as: ProAir HFA Inhale 2 puffs into the lungs every 6 (six) hours as needed for wheezing or shortness of breath.   ALPRAZolam 0.5 MG tablet Commonly known as: Xanax Take 1 tablet (0.5 mg total) by mouth daily as needed for anxiety. What changed: how much to take   amoxicillin-clavulanate 875-125 MG tablet Commonly known as: Augmentin Take 1 tablet by mouth 2 (two) times daily for 7 days.   aspirin 81 MG tablet Take 81 mg by mouth at bedtime.   atorvastatin 20 MG tablet Commonly known as: LIPITOR Take 20 mg by mouth daily.   cloNIDine 0.1 MG tablet Commonly known as: CATAPRES Take 1 tablet (0.1 mg total) by mouth 3 (three) times daily.   diphenhydrAMINE 25 MG tablet Commonly known as: BENADRYL Take 25 mg by mouth every 6 (six) hours as needed for allergies or sleep.   fluocinonide 0.05 % external solution Commonly known as: LIDEX Apply 1 application topically daily.   lisinopril 40 MG tablet Commonly known as: ZESTRIL Take 1 tablet (40 mg total) by mouth daily.   magnesium oxide 400 MG tablet Commonly known as: MAG-OX Take 1 tablet (400 mg total) by mouth daily for 14 days.   meloxicam 15 MG tablet Commonly known as: MOBIC Take 1 tablet (15 mg total) by mouth daily.   metFORMIN 1000 MG tablet Commonly known as: GLUCOPHAGE TAKE 1 TABLET BY MOUTH  TWICE DAILY WITH MEALS What changed:  how much to take how to take this when to take this additional instructions   multivitamin with minerals tablet Take 1 tablet by mouth daily. Centrum silver for women   nitroGLYCERIN 0.4 MG SL tablet Commonly known as: NITROSTAT Place 1 tablet (0.4 mg total) under the tongue every 5 (five) minutes as needed for chest pain.   potassium chloride SA 20 MEQ tablet Commonly known as: KLOR-CON Take 2 tablets (40 mEq total) by mouth daily for 5 days. Start taking on: May 13, 2021   venlafaxine XR 75 MG 24 hr capsule Commonly known as:  EFFEXOR-XR TAKE THREE CAPSULES BY MOUTH DAILY WITH BREAKFAST What changed: See the new instructions.        Follow-up Information     Leamon Arnt, MD. Schedule an appointment as soon as possible for a visit in 1 week(s).   Specialty: Family Medicine Contact information: [place] [address] [phone]         Jerline Pain, MD .   Specialty: Cardiology Contact information: [phone] [place] [address] [phone]                No Known Allergies  Consultations: None   Procedures/Studies: CT ABDOMEN PELVIS WO CONTRAST  Result Date: 05/10/2021 CLINICAL DATA:  Abdominal pain and fever EXAM: CT ABDOMEN AND PELVIS WITHOUT CONTRAST TECHNIQUE: Multidetector CT imaging of the abdomen and pelvis was performed following the standard protocol without IV contrast. COMPARISON:  Ultrasound 03/07/2021.  CT 02/06/2021. FINDINGS: Lower chest: Normal Hepatobiliary: Multiple pigment stones within the gallbladder. No CT evidence of cholecystitis or obstruction. No liver parenchymal abnormality seen. Pancreas: Normal.  Small duodenal diverticulum. Spleen: Normal Adrenals/Urinary  Tract: Adrenal gland on the left is normal. Low-density lesion with punctate calcification of right adrenal gland is unchanged most consistent with an adenoma. Maximal dimension 1.9 cm. Bladder is normal. Few small renal cysts. No evidence stone or hydronephrosis. No evidence of swelling or edema. Stomach/Bowel: Stomach and small intestine appear normal. Mild diverticulosis of the left colon without CT evidence of diverticulitis. Redundant but unremarkable cecum. Vascular/Lymphatic: Aortic atherosclerosis. No aneurysm. IVC is normal. No retroperitoneal adenopathy. Reproductive: No pelvic mass. Other: No free fluid or air. Musculoskeletal: Scoliosis and chronic degenerative changes of the spine. IMPRESSION: No acute finding in the abdomen. Multiple pigment stones  in the gallbladder but without CT evidence of cholecystitis or obstruction. No evidence of urinary tract inflammatory disease by CT. Right adrenal adenoma measuring 1.9 cm as seen previously. Chronic lumbar degenerative changes which could be painful. Electronically Signed   By: Nelson Chimes M.D.   On: 05/10/2021 16:03   CT LUMBAR SPINE WO CONTRAST  Result Date: 05/10/2021 CLINICAL DATA:  Low back pain.  No reported injury. EXAM: CT LUMBAR SPINE WITHOUT CONTRAST TECHNIQUE: Multidetector CT imaging of the lumbar spine was performed without intravenous contrast administration. Multiplanar CT image reconstructions were also generated. COMPARISON:  CT abdomen and pelvis 05/10/2021 and 02/06/2021. FINDINGS: Segmentation: 5 lumbar type vertebral bodies. Alignment: Normal alignment. Vertebrae: No vertebral compression deformities. No focal bone lesion or bone destruction. Bone cortex appears intact. Paraspinal and other soft tissues: No abnormal paraspinal soft tissue mass or infiltration. Incidental note of aortic vascular calcifications. Disc levels: Degenerative changes in the lumbar spine with disc space narrowing and endplate osteophyte formation. Degenerative changes throughout the facet joints. Most severe degenerative changes are seen at L2-3, were there is complete loss of disc space, vacuum disc phenomenon, and sclerosis and lucency at the endplates. There appears to be at least moderate central canal stenosis at this level. Disc protrusion suggested at L3-4 and L4-5 causing effacement of the anterior thecal sac. IMPRESSION: Degenerative changes in the lumbar spine, most prominent at L2-3. Disc protrusion also suggested at L3-4 and L4-5. There is likely at least moderate central canal stenosis at L2-3. No acute displaced fractures identified. Electronically Signed   By: Lucienne Capers M.D.   On: 05/10/2021 17:32   DG Chest Port 1 View  Result Date: 05/10/2021 CLINICAL DATA:  Sepsis.  Fever of unknown  origin for 2.5 days. EXAM: PORTABLE CHEST 1 VIEW COMPARISON:  09/14/2014 FINDINGS: The heart size and mediastinal contours are within normal limits. Both lungs are clear. The visualized skeletal structures are unremarkable. IMPRESSION: No active disease. Electronically Signed   By: Kerby Moors M.D.   On: 05/10/2021 14:02      Subjective:  Patient seen and examined the bedside this morning.  Hemodynamically stable for discharge.  Discharge Exam: Vitals:   05/11/21 1738 05/12/21 0520  BP: 133/62 (!) 150/66  Pulse: 64 86  Resp: 18 18  Temp: 98.6 F (37 C) 99.6 F (37.6 C)  SpO2: 90% 97%   Vitals:   05/11/21 1417 05/11/21 1738 05/11/21 2055 05/12/21 0520  BP:  133/62  (!) 150/66  Pulse:  64  86  Resp:  18  18  Temp: (!) 100.4 F (38 C) 98.6 F (37 C)  99.6 F (37.6 C)  TempSrc: Oral Oral  Oral  SpO2:  90%  97%  Height:   5\' 7"  (1.702 m)     General: Pt is alert, awake, not in acute distress Cardiovascular: RRR, S1/S2 +, no  rubs, no gallops Respiratory: CTA bilaterally, no wheezing, no rhonchi Abdominal: Soft, NT, ND, bowel sounds + Extremities: no edema, no cyanosis    The results of significant diagnostics from this hospitalization (including imaging, microbiology, ancillary and laboratory) are listed below for reference.     Microbiology: Recent Results (from the past 240 hour(s))  Urine culture     Status: Abnormal   Collection Time: 05/10/21 12:13 PM   Specimen: Urine, Random  Result Value Ref Range Status   Specimen Description   Final    URINE, RANDOM Performed at [facility] [address] [place], [place] [address]    Special Requests   Final    NONE Performed at [facility], [place] [address], [place], [place] [address]    Culture MULTIPLE SPECIES PRESENT, SUGGEST RECOLLECTION (A)  Final   Report Status 05/12/2021 FINAL  Final  Blood Culture (routine x 2)     Status: None (Preliminary result)   Collection  Time: 05/10/21  1:12 PM   Specimen: BLOOD RIGHT FOREARM  Result Value Ref Range Status   Specimen Description   Final    BLOOD RIGHT FOREARM Performed at [facility] [address] [place], [place] [address]    Special Requests   Final    BOTTLES DRAWN AEROBIC AND ANAEROBIC Blood Culture results may not be optimal due to an inadequate volume of blood received in culture bottles Performed at [facility] [address] [place], [place] [address]    Culture   Final    NO GROWTH 2 DAYS Performed at [facility] [address], [place], [place] [address]    Report Status PENDING  Incomplete  Resp Panel by RT-PCR (Flu A&B, Covid) Nasopharyngeal Swab     Status: None   Collection Time: 05/10/21  1:26 PM   Specimen: Nasopharyngeal Swab; Nasopharyngeal(NP) swabs in vial transport medium  Result Value Ref Range Status   SARS Coronavirus 2 by RT PCR NEGATIVE NEGATIVE Final    Comment: (NOTE) SARS-CoV-2 target nucleic acids are NOT DETECTED.  The SARS-CoV-2 RNA is generally detectable in upper respiratory specimens during the acute phase of infection. The lowest concentration of SARS-CoV-2 viral copies this assay can detect is 138 copies/mL. A negative result does not preclude SARS-Cov-2 infection and should not be used as the sole basis for treatment or other patient management decisions. A negative result may occur with  improper specimen collection/handling, submission of specimen other than nasopharyngeal swab, presence of viral mutation(s) within the areas targeted by this assay, and inadequate number of viral copies(<138 copies/mL). A negative result must be combined with clinical observations, patient history, and epidemiological information. The expected result is Negative.  Fact Sheet for Patients:  EntrepreneurPulse.com.au  Fact Sheet for Healthcare Providers:  IncredibleEmployment.be  This  test is no t yet approved or cleared by the [place] FDA and  has been authorized for detection and/or diagnosis of SARS-CoV-2 by FDA under an Emergency Use Authorization (EUA). This EUA will remain  in effect (meaning this test can be used) for the duration of the COVID-19 declaration under Section 564(b)(1) of the Act, 21 U.S.C.section 360bbb-3(b)(1), unless the authorization is terminated  or revoked sooner.       Influenza A by PCR NEGATIVE NEGATIVE Final   Influenza B by PCR NEGATIVE NEGATIVE Final    Comment: (NOTE) The Xpert Xpress SARS-CoV-2/FLU/RSV plus assay is intended as an aid in the diagnosis of influenza  from Nasopharyngeal swab specimens and should not be used as a sole basis for treatment. Nasal washings and aspirates are unacceptable for Xpert Xpress SARS-CoV-2/FLU/RSV testing.  Fact Sheet for Patients: EntrepreneurPulse.com.au  Fact Sheet for Healthcare Providers: IncredibleEmployment.be  This test is not yet approved or cleared by the [place] FDA and has been authorized for detection and/or diagnosis of SARS-CoV-2 by FDA under an Emergency Use Authorization (EUA). This EUA will remain in effect (meaning this test can be used) for the duration of the COVID-19 declaration under Section 564(b)(1) of the Act, 21 U.S.C. section 360bbb-3(b)(1), unless the authorization is terminated or revoked.  Performed at [facility], [place] [address] [place], [place] [address]   Blood Culture (routine x 2)     Status: None (Preliminary result)   Collection Time: 05/10/21  1:26 PM   Specimen: BLOOD  Result Value Ref Range Status   Specimen Description   Final    BLOOD BLOOD RIGHT FOREARM Performed at [facility] [address] [place], [place] [address]    Special Requests   Final    BOTTLES DRAWN AEROBIC AND ANAEROBIC Blood Culture results may not be optimal due to an inadequate volume  of blood received in culture bottles Performed at [facility] [address], [place], [place] [address]    Culture   Final    NO GROWTH 2 DAYS Performed at [facility] [address], [place], [place] [address]    Report Status PENDING  Incomplete     Labs: BNP (last 3 results) No results for input(s): BNP in the last 8760 hours. Basic Metabolic Panel: Recent Labs  Lab 05/10/21 1238 05/10/21 1949 05/11/21 0523 05/12/21 0510  NA 127*  --  131* 129*  K 3.9  --  3.6 3.0*  CL 93*  --  95* 95*  CO2 25  --  24 25  GLUCOSE 184*  --  122* 101*  BUN 24*  --  19 12  CREATININE 1.73*  --  1.24* 1.09*  CALCIUM 9.8  --  9.8 9.1  MG  --  1.6*  --  1.4*   Liver Function Tests: Recent Labs  Lab 05/10/21 1238 05/11/21 0523  AST 41 52*  ALT 38 42  ALKPHOS 111 118  BILITOT 0.5 0.4  PROT 7.2 7.2  ALBUMIN 3.8 3.6   No results for input(s): LIPASE, AMYLASE in the last 168 hours. No results for input(s): AMMONIA in the last 168 hours. CBC: Recent Labs  Lab 05/10/21 1238 05/11/21 0523 05/12/21 0510  WBC 10.2 8.4 4.5  NEUTROABS 9.6*  --  3.7  HGB 13.0 13.6 11.2*  HCT 38.0 38.8 31.7*  MCV 89.4 89.2 87.3  PLT 218 195 171   Cardiac Enzymes: No results for input(s): CKTOTAL, CKMB, CKMBINDEX, TROPONINI in the last 168 hours. BNP: Invalid input(s): POCBNP CBG: Recent Labs  Lab 05/11/21 0741 05/11/21 1224 05/11/21 1741 05/11/21 2145 05/12/21 0733  GLUCAP 126* 140* 135* 122* 128*   D-Dimer No results for input(s): DDIMER in the last 72 hours. Hgb A1c Recent Labs    05/10/21 1849  HGBA1C 6.6*   Lipid Profile No results for input(s): CHOL, HDL, LDLCALC, TRIG, CHOLHDL, LDLDIRECT in the last 72 hours. Thyroid function studies Recent Labs    05/10/21 1929  TSH 1.971   Anemia work up No results for input(s): VITAMINB12, FOLATE, FERRITIN, TIBC, IRON, RETICCTPCT in the last 72 hours. Urinalysis    Component  Value Date/Time   COLORURINE  YELLOW 05/10/2021 1213   APPEARANCEUR HAZY (A) 05/10/2021 1213   LABSPEC 1.016 05/10/2021 1213   PHURINE 5.0 05/10/2021 1213   GLUCOSEU NEGATIVE 05/10/2021 1213   HGBUR NEGATIVE 05/10/2021 [place] 05/10/2021 1213   BILIRUBINUR neg 05/10/2021 0900   KETONESUR NEGATIVE 05/10/2021 1213   PROTEINUR 100 (A) 05/10/2021 1213   PROTEINUR Positive (A) 05/10/2021 0900   UROBILINOGEN 0.2 05/10/2021 0900   UROBILINOGEN 1.0 09/14/2014 1614   NITRITE NEGATIVE 05/10/2021 1213   NITRITE neg 05/10/2021 0900   LEUKOCYTESUR NEGATIVE 05/10/2021 1213   LEUKOCYTESUR Negative 05/10/2021 0900   Sepsis Labs Invalid input(s): PROCALCITONIN,  WBC,  LACTICIDVEN Microbiology Recent Results (from the past 240 hour(s))  Urine culture     Status: Abnormal   Collection Time: 05/10/21 12:13 PM   Specimen: Urine, Random  Result Value Ref Range Status   Specimen Description   Final    URINE, RANDOM Performed at [facility], [place] [address] [place], [place] [address]    Special Requests   Final    NONE Performed at [facility] [place] [address] [place], [place] [address]    Culture MULTIPLE SPECIES PRESENT, SUGGEST RECOLLECTION (A)  Final   Report Status 05/12/2021 FINAL  Final  Blood Culture (routine x 2)     Status: None (Preliminary result)   Collection Time: 05/10/21  1:12 PM   Specimen: BLOOD RIGHT FOREARM  Result Value Ref Range Status   Specimen Description   Final    BLOOD RIGHT FOREARM Performed at [facility] [address], [place], [place] [address]    Special Requests   Final    BOTTLES DRAWN AEROBIC AND ANAEROBIC Blood Culture results may not be optimal due to an inadequate volume of blood received in culture bottles Performed at [facility] [address], [place], [place] [address]    Culture   Final    NO GROWTH 2 DAYS Performed at [facility] [address] [place], [place]  [address]    Report Status PENDING  Incomplete  Resp Panel by RT-PCR (Flu A&B, Covid) Nasopharyngeal Swab     Status: None   Collection Time: 05/10/21  1:26 PM   Specimen: Nasopharyngeal Swab; Nasopharyngeal(NP) swabs in vial transport medium  Result Value Ref Range Status   SARS Coronavirus 2 by RT PCR NEGATIVE NEGATIVE Final    Comment: (NOTE) SARS-CoV-2 target nucleic acids are NOT DETECTED.  The SARS-CoV-2 RNA is generally detectable in upper respiratory specimens during the acute phase of infection. The lowest concentration of SARS-CoV-2 viral copies this assay can detect is 138 copies/mL. A negative result does not preclude SARS-Cov-2 infection and should not be used as the sole basis for treatment or other patient management decisions. A negative result may occur with  improper specimen collection/handling, submission of specimen other than nasopharyngeal swab, presence of viral mutation(s) within the areas targeted by this assay, and inadequate number of viral copies(<138 copies/mL). A negative result must be combined with clinical observations, patient history, and epidemiological information. The expected result is Negative.  Fact Sheet for Patients:  EntrepreneurPulse.com.au  Fact Sheet for Healthcare Providers:  IncredibleEmployment.be  This test is no t yet approved or cleared by the [place] FDA and  has been authorized for detection and/or diagnosis of SARS-CoV-2 by FDA under an Emergency Use Authorization (EUA). This EUA will remain  in effect (meaning this test can  be used) for the duration of the COVID-19 declaration under Section 564(b)(1) of the Act, 21 U.S.C.section 360bbb-3(b)(1), unless the authorization is terminated  or revoked sooner.       Influenza A by PCR NEGATIVE NEGATIVE Final   Influenza B by PCR NEGATIVE NEGATIVE Final    Comment: (NOTE) The Xpert Xpress SARS-CoV-2/FLU/RSV plus assay is intended as an  aid in the diagnosis of influenza from Nasopharyngeal swab specimens and should not be used as a sole basis for treatment. Nasal washings and aspirates are unacceptable for Xpert Xpress SARS-CoV-2/FLU/RSV testing.  Fact Sheet for Patients: EntrepreneurPulse.com.au  Fact Sheet for Healthcare Providers: IncredibleEmployment.be  This test is not yet approved or cleared by the [place] FDA and has been authorized for detection and/or diagnosis of SARS-CoV-2 by FDA under an Emergency Use Authorization (EUA). This EUA will remain in effect (meaning this test can be used) for the duration of the COVID-19 declaration under Section 564(b)(1) of the Act, 21 U.S.C. section 360bbb-3(b)(1), unless the authorization is terminated or revoked.  Performed at [facility], [place] [address] [place], [place] [address]   Blood Culture (routine x 2)     Status: None (Preliminary result)   Collection Time: 05/10/21  1:26 PM   Specimen: BLOOD  Result Value Ref Range Status   Specimen Description   Final    BLOOD BLOOD RIGHT FOREARM Performed at [facility] [address] [place], [place] [address]    Special Requests   Final    BOTTLES DRAWN AEROBIC AND ANAEROBIC Blood Culture results may not be optimal due to an inadequate volume of blood received in culture bottles Performed at [facility] [address] [place], [place] [address]    Culture   Final    NO GROWTH 2 DAYS Performed at [facility] [address] [place], [place] [address]    Report Status PENDING  Incomplete    Please note: You were cared for by a hospitalist during your hospital stay. Once you are discharged, your primary care physician will handle any further medical issues. Please note that NO REFILLS for any discharge medications will be authorized once you are discharged, as it is imperative that you return to your  primary care physician (or establish a relationship with a primary care physician if you do not have one) for your post hospital discharge needs so that they can reassess your need for medications and monitor your lab values.    Time coordinating discharge: 40 minutes  SIGNED:   Shelly Coss, MD  [facility] 05/12/2021, 11:17 AM Pager [phone]  If 7PM-7AM, please contact night-coverage [email or web address] Password [email or web address]

## 2021-05-12 NOTE — Plan of Care (Signed)

## 2021-05-14 ENCOUNTER — Other Ambulatory Visit: Payer: Self-pay | Admitting: Family Medicine

## 2021-05-15 ENCOUNTER — Other Ambulatory Visit: Payer: Self-pay

## 2021-05-15 ENCOUNTER — Ambulatory Visit (INDEPENDENT_AMBULATORY_CARE_PROVIDER_SITE_OTHER): Payer: Medicare Other | Admitting: Family Medicine

## 2021-05-15 ENCOUNTER — Telehealth: Payer: Self-pay

## 2021-05-15 ENCOUNTER — Encounter: Payer: Self-pay | Admitting: Family Medicine

## 2021-05-15 VITALS — BP 190/92 | HR 61 | Temp 97.8°F | Resp 15 | Wt 194.8 lb

## 2021-05-15 DIAGNOSIS — E1159 Type 2 diabetes mellitus with other circulatory complications: Secondary | ICD-10-CM

## 2021-05-15 DIAGNOSIS — A419 Sepsis, unspecified organism: Secondary | ICD-10-CM

## 2021-05-15 DIAGNOSIS — N1831 Chronic kidney disease, stage 3a: Secondary | ICD-10-CM | POA: Diagnosis not present

## 2021-05-15 DIAGNOSIS — E119 Type 2 diabetes mellitus without complications: Secondary | ICD-10-CM | POA: Diagnosis not present

## 2021-05-15 DIAGNOSIS — E871 Hypo-osmolality and hyponatremia: Secondary | ICD-10-CM

## 2021-05-15 DIAGNOSIS — R6521 Severe sepsis with septic shock: Secondary | ICD-10-CM | POA: Diagnosis not present

## 2021-05-15 DIAGNOSIS — I152 Hypertension secondary to endocrine disorders: Secondary | ICD-10-CM | POA: Diagnosis not present

## 2021-05-15 LAB — CULTURE, BLOOD (ROUTINE X 2)
Culture: NO GROWTH
Culture: NO GROWTH

## 2021-05-15 NOTE — Patient Instructions (Signed)
Please return on June 29th at 3pm. Please schedule this appointment on the way out.   Keep hydrated. Take all of your medications and complete your antibiotics. Call for fever or lightheadedness. Please start checking your blood pressure at home twice a day and write down the readings for me; bring it in when you return.   If you have any questions or concerns, please don't hesitate to send me a message via MyChart or call the office at [phone]. Thank you for visiting with [place] today! It's our pleasure caring for you.

## 2021-05-15 NOTE — Progress Notes (Signed)
Subjective  CC:  Chief Complaint  Patient presents with   Hospitalization Follow-up    Sepsis, 6/15-6/17, is feeling better since hosp discharge. Did fall Friday night - left eye and side of face bruised    HPI: Jessica Curry is a 70 y.o. female who presents to the office today to address the problems listed above in the chief complaint. Short-term follow-up after hospitalization June 15 through June 17 for severe sepsis, hypotension of unclear source.  I reviewed all hospital records.  Thorough work-up never revealed a source.  Negative blood cultures to date.  Negative abdominal CT, lumbar CT, urine culture, chest x-ray.  Since discharge, she has had no further fevers.  She is feeling better.  Remains fatigued.  Taking Augmentin to complete her antibiotic course.  She was discharged on her antihypertensives.  However she has not taken her second dose of hydralazine today.  She remains on lisinopril.  Lab work was remarkable for hypomagnesemia and hypokalemia and hyponatremia.  Potassium and magnesium supplements were started yesterday.  She has no nausea or vomiting.  She denies lightheadedness, chest pain or shortness of breath.  Her back pain has improved. This AM, she slipped on her bathroom floor, wearing socks, and hit her left eye on the counter.  She denies vision changes or headaches.  Assessment  1. Sepsis with acute organ dysfunction and septic shock, due to unspecified organism, unspecified type ([place])   2. Chronic kidney disease (CKD) stage G3a/A1, moderately decreased glomerular filtration rate (GFR) between 45-59 mL/min/1.73 square meter and albuminuria creatinine ratio less than 30 mg/g (HCC)   3. Chronic hyponatremia   4. Controlled type 2 diabetes mellitus without complication, without long-term current use of insulin ([place])   5. Hypertension associated with diabetes ([place])      Plan  Sepsis: Improving on antibiotics, unclear source.  Will monitor closely.  Continue to  monitor fever curve.  Continue hydration.  Will complete Augmentin.  Recommend follow-up in 10 days to repeat lab work.  Patient to call if any problems develop in the interim. Hypertension: Blood pressure is elevated in the office today but she did miss her afternoon dose of medication.  She will start checking her blood pressures at home.  No changes made today given recent hypotension and sepsis. Diabetes: Stable by recent lab work.  Continue metformin.  Renal function had stabilized on discharge. Electrolyte deficiencies: Replace potassium and magnesium.  Recheck in 10 days.  Has chronic hyponatremia.  Follow up: Recheck June 29 06/13/2021  No orders of the defined types were placed in this encounter.  No orders of the defined types were placed in this encounter.     I reviewed the patients updated PMH, FH, and SocHx.    Patient Active Problem List   Diagnosis Date Noted   History of heart artery stent 02/10/2020    Priority: High   Simple chronic bronchitis ([place]) 02/11/2018    Priority: High   Hyperlipidemia associated with type 2 diabetes mellitus ([place]) 06/04/2017    Priority: High   Diabetes mellitus type II, controlled ([place]) 09/10/2016    Priority: High   CAD (coronary artery disease), native coronary artery 09/10/2016    Priority: High   Hypertension associated with diabetes ([place]) 09/10/2016    Priority: High   Smoker 09/10/2016    Priority: High   Depression, major, recurrent, in partial remission ([place])     Priority: High   Chronic hyponatremia 02/07/2021    Priority: Medium  Bilateral renal cysts 02/06/2021    Priority: Medium   Chronic midline low back pain without sciatica 12/26/2020    Priority: Medium   Serrated polyp of colon 08/12/2018    Priority: Medium   Osteopenia 06/10/2017    Priority: Medium   Encounter for screening for lung cancer 02/10/2020    Priority: Low   Mild intermittent asthma 08/06/2019    Priority: Low   Severe sepsis ([place])  05/11/2021   Fever 05/10/2021   Alcohol use 02/07/2021   Chronic kidney disease (CKD) stage G3a/A1, moderately decreased glomerular filtration rate (GFR) between 45-59 mL/min/1.73 square meter and albuminuria creatinine ratio less than 30 mg/g (HCC) 02/07/2021   Current Meds  Medication Sig   albuterol (PROAIR HFA) 108 (90 Base) MCG/ACT inhaler Inhale 2 puffs into the lungs every 6 (six) hours as needed for wheezing or shortness of breath.   ALPRAZolam (XANAX) 0.5 MG tablet Take 1 tablet (0.5 mg total) by mouth daily as needed for anxiety.   amoxicillin-clavulanate (AUGMENTIN) 875-125 MG tablet Take 1 tablet by mouth 2 (two) times daily for 7 days.   aspirin 81 MG tablet Take 81 mg by mouth at bedtime.   atorvastatin (LIPITOR) 20 MG tablet Take 20 mg by mouth daily.   cloNIDine (CATAPRES) 0.1 MG tablet TAKE 1 TABLET BY MOUTH 3  TIMES DAILY   diphenhydrAMINE (BENADRYL) 25 MG tablet Take 25 mg by mouth every 6 (six) hours as needed for allergies or sleep.   fluocinonide (LIDEX) 0.05 % external solution Apply 1 application topically daily.   lisinopril (ZESTRIL) 40 MG tablet Take 1 tablet (40 mg total) by mouth daily.   magnesium oxide (MAG-OX) 400 MG tablet Take 1 tablet (400 mg total) by mouth daily for 14 days.   meloxicam (MOBIC) 15 MG tablet Take 1 tablet (15 mg total) by mouth daily.   metFORMIN (GLUCOPHAGE) 1000 MG tablet TAKE 1 TABLET BY MOUTH  TWICE DAILY WITH MEALS   Multiple Vitamins-Minerals (MULTIVITAMIN WITH MINERALS) tablet Take 1 tablet by mouth daily. Centrum silver for women   potassium chloride (KLOR-CON) 20 MEQ tablet Take 2 tablets (40 mEq total) by mouth daily for 5 days.   venlafaxine XR (EFFEXOR-XR) 75 MG 24 hr capsule TAKE THREE CAPSULES BY MOUTH DAILY WITH BREAKFAST    Allergies: Patient has No Known Allergies. Family History: Patient family history includes AAA (abdominal aortic aneurysm) in her mother; Arthritis in her brother and mother; Dementia in her maternal  grandmother; Heart attack in her brother; Hyperlipidemia in her brother; Hypertension in her brother; Hypothyroidism in her mother; Kidney disease in her mother; Other in her father; Ovarian cancer in her mother; Stroke in her brother. Social History:  Patient  reports that she has been smoking cigarettes. She has a 14.40 pack-year smoking history. She has never used smokeless tobacco. She reports current alcohol use of about 4.0 standard drinks of alcohol per week. She reports that she does not use drugs.  Review of Systems: Constitutional: Negative for fever malaise or anorexia Cardiovascular: negative for chest pain Respiratory: negative for SOB or persistent cough Gastrointestinal: negative for abdominal pain  Objective  Vitals: BP (!) 190/92   Pulse 61   Temp 97.8 F (36.6 C) (Oral)   Resp 15   Wt 194 lb 12.8 oz (88.4 kg)   LMP  (LMP Unknown)   SpO2 98%   BMI 30.51 kg/m  General: no acute distress , A&Ox3 HEENT: PEERL, conjunctiva normal, neck is supple, left orbit with contusion.  Normal eye movement Cardiovascular:  RRR without murmur or gallop.  Respiratory:  Good breath sounds bilaterally, CTAB with normal respiratory effort Abdomen is nontender Skin:  Warm, no rashes  Lab Results  Component Value Date   WBC 4.5 05/12/2021   HGB 11.2 (L) 05/12/2021   HCT 31.7 (L) 05/12/2021   MCV 87.3 05/12/2021   PLT 171 05/12/2021   Lab Results  Component Value Date   CREATININE 1.09 (H) 05/12/2021   BUN 12 05/12/2021   NA 129 (L) 05/12/2021   K 3.0 (L) 05/12/2021   CL 95 (L) 05/12/2021   CO2 25 05/12/2021     Commons side effects, risks, benefits, and alternatives for medications and treatment plan prescribed today were discussed, and the patient expressed understanding of the given instructions. Patient is instructed to call or message via MyChart if he/she has any questions or concerns regarding our treatment plan. No barriers to understanding were identified. We discussed  Red Flag symptoms and signs in detail. Patient expressed understanding regarding what to do in case of urgent or emergency type symptoms.  Medication list was reconciled, printed and provided to the patient in AVS. Patient instructions and summary information was reviewed with the patient as documented in the AVS. This note was prepared with assistance of Dragon voice recognition software. Occasional wrong-word or sound-a-like substitutions may have occurred due to the inherent limitations of voice recognition software  This visit occurred during the SARS-CoV-2 public health emergency.  Safety protocols were in place, including screening questions prior to the visit, additional usage of staff PPE, and extensive cleaning of exam room while observing appropriate contact time as indicated for disinfecting solutions.

## 2021-05-17 ENCOUNTER — Other Ambulatory Visit: Payer: Self-pay | Admitting: Family Medicine

## 2021-05-25 ENCOUNTER — Ambulatory Visit: Payer: Medicare Other

## 2021-05-25 ENCOUNTER — Other Ambulatory Visit: Payer: Self-pay

## 2021-05-26 ENCOUNTER — Ambulatory Visit (INDEPENDENT_AMBULATORY_CARE_PROVIDER_SITE_OTHER): Payer: Medicare Other

## 2021-05-26 ENCOUNTER — Other Ambulatory Visit: Payer: Medicare Other

## 2021-05-26 ENCOUNTER — Ambulatory Visit (INDEPENDENT_AMBULATORY_CARE_PROVIDER_SITE_OTHER): Payer: Medicare Other | Admitting: Family Medicine

## 2021-05-26 ENCOUNTER — Other Ambulatory Visit: Payer: Self-pay

## 2021-05-26 ENCOUNTER — Encounter: Payer: Self-pay | Admitting: Family Medicine

## 2021-05-26 VITALS — BP 130/78 | HR 54 | Temp 97.6°F | Wt 189.0 lb

## 2021-05-26 VITALS — BP 130/78 | HR 54 | Temp 97.6°F | Ht 67.0 in | Wt 189.0 lb

## 2021-05-26 DIAGNOSIS — E1159 Type 2 diabetes mellitus with other circulatory complications: Secondary | ICD-10-CM | POA: Diagnosis not present

## 2021-05-26 DIAGNOSIS — A419 Sepsis, unspecified organism: Secondary | ICD-10-CM | POA: Diagnosis not present

## 2021-05-26 DIAGNOSIS — N1831 Chronic kidney disease, stage 3a: Secondary | ICD-10-CM | POA: Diagnosis not present

## 2021-05-26 DIAGNOSIS — I152 Hypertension secondary to endocrine disorders: Secondary | ICD-10-CM

## 2021-05-26 DIAGNOSIS — N281 Cyst of kidney, acquired: Secondary | ICD-10-CM

## 2021-05-26 DIAGNOSIS — E871 Hypo-osmolality and hyponatremia: Secondary | ICD-10-CM | POA: Diagnosis not present

## 2021-05-26 DIAGNOSIS — Z8619 Personal history of other infectious and parasitic diseases: Secondary | ICD-10-CM | POA: Diagnosis not present

## 2021-05-26 DIAGNOSIS — Z0000 Encounter for general adult medical examination without abnormal findings: Secondary | ICD-10-CM | POA: Diagnosis not present

## 2021-05-26 DIAGNOSIS — R6521 Severe sepsis with septic shock: Secondary | ICD-10-CM | POA: Diagnosis not present

## 2021-05-26 LAB — CBC WITH DIFFERENTIAL/PLATELET
Basophils Absolute: 0.1 10*3/uL (ref 0.0–0.1)
Basophils Relative: 1.7 % (ref 0.0–3.0)
Eosinophils Absolute: 0.1 10*3/uL (ref 0.0–0.7)
Eosinophils Relative: 3 % (ref 0.0–5.0)
HCT: 37.1 % (ref 36.0–46.0)
Hemoglobin: 12.7 g/dL (ref 12.0–15.0)
Lymphocytes Relative: 28.7 % (ref 12.0–46.0)
Lymphs Abs: 1.4 10*3/uL (ref 0.7–4.0)
MCHC: 34.2 g/dL (ref 30.0–36.0)
MCV: 90.8 fl (ref 78.0–100.0)
Monocytes Absolute: 0.3 10*3/uL (ref 0.1–1.0)
Monocytes Relative: 6.7 % (ref 3.0–12.0)
Neutro Abs: 3 10*3/uL (ref 1.4–7.7)
Neutrophils Relative %: 59.9 % (ref 43.0–77.0)
Platelets: 359 10*3/uL (ref 150.0–400.0)
RBC: 4.08 Mil/uL (ref 3.87–5.11)
RDW: 15.2 % (ref 11.5–15.5)
WBC: 5 10*3/uL (ref 4.0–10.5)

## 2021-05-26 LAB — BASIC METABOLIC PANEL
BUN: 15 mg/dL (ref 6–23)
CO2: 27 mEq/L (ref 19–32)
Calcium: 10.1 mg/dL (ref 8.4–10.5)
Chloride: 99 mEq/L (ref 96–112)
Creatinine, Ser: 1 mg/dL (ref 0.40–1.20)
GFR: 57.33 mL/min — ABNORMAL LOW (ref >60.00)
Glucose, Bld: 99 mg/dL (ref 70–99)
Potassium: 4.9 mEq/L (ref 3.5–5.1)
Sodium: 132 mEq/L — ABNORMAL LOW (ref 135–145)

## 2021-05-26 LAB — MAGNESIUM: Magnesium: 2 mg/dL (ref 1.5–2.5)

## 2021-05-26 NOTE — Progress Notes (Signed)
Subjective:   Jessica Curry is a 70 y.o. female who presents for Medicare Annual (Subsequent) preventive examination.  Review of Systems     Cardiac Risk Factors include: advanced age (>34men, >21 women);diabetes mellitus;hypertension;dyslipidemia     Objective:    Today's Vitals   05/26/21 0854  BP: 130/78  Pulse: (!) 54  Temp: 97.6 F (36.4 C)  SpO2: 98%  Weight: 189 lb (85.7 kg)  PainSc: 3    Body mass index is 29.6 kg/m.  Advanced Directives 05/26/2021 05/10/2021 02/06/2021 09/08/2019 08/06/2018 09/14/2014  Does Patient Have a Medical Advance Directive? Yes No Yes No No No  Type of Advance Directive - Public librarian;Living will - - -  Does patient want to make changes to medical advance directive? Yes (MAU/Ambulatory/Procedural Areas - Information given) - No - Patient declined - - -  Would patient like information on creating a medical advance directive? - No - Patient declined - Yes (MAU/Ambulatory/Procedural Areas - Information given) Yes (MAU/Ambulatory/Procedural Areas - Information given) No - patient declined information    Current Medications (verified) Outpatient Encounter Medications as of 05/26/2021  Medication Sig   albuterol (PROAIR HFA) 108 (90 Base) MCG/ACT inhaler Inhale 2 puffs into the lungs every 6 (six) hours as needed for wheezing or shortness of breath.   ALPRAZolam (XANAX) 0.5 MG tablet Take 1 tablet (0.5 mg total) by mouth daily as needed for anxiety.   aspirin 81 MG tablet Take 81 mg by mouth at bedtime.   atorvastatin (LIPITOR) 20 MG tablet Take 20 mg by mouth daily.   cloNIDine (CATAPRES) 0.1 MG tablet TAKE 1 TABLET BY MOUTH 3  TIMES DAILY   diphenhydrAMINE (BENADRYL) 25 MG tablet Take 25 mg by mouth every 6 (six) hours as needed for allergies or sleep.   fluocinonide (LIDEX) 0.05 % external solution Apply 1 application topically daily.   lisinopril (ZESTRIL) 40 MG tablet Take 1 tablet (40 mg total) by mouth daily.   meloxicam  (MOBIC) 15 MG tablet Take 1 tablet (15 mg total) by mouth daily.   metFORMIN (GLUCOPHAGE) 1000 MG tablet TAKE 1 TABLET BY MOUTH  TWICE DAILY WITH MEALS   Multiple Vitamins-Minerals (MULTIVITAMIN WITH MINERALS) tablet Take 1 tablet by mouth daily. Centrum silver for women   venlafaxine XR (EFFEXOR-XR) 75 MG 24 hr capsule TAKE THREE CAPSULES BY MOUTH DAILY WITH BREAKFAST   magnesium oxide (MAG-OX) 400 MG tablet Take 1 tablet (400 mg total) by mouth daily for 14 days. (Patient not taking: Reported on 05/26/2021)   nitroGLYCERIN (NITROSTAT) 0.4 MG SL tablet Place 1 tablet (0.4 mg total) under the tongue every 5 (five) minutes as needed for chest pain.   potassium chloride (KLOR-CON) 20 MEQ tablet Take 2 tablets (40 mEq total) by mouth daily for 5 days.   No facility-administered encounter medications on file as of 05/26/2021.    Allergies (verified) Patient has no known allergies.   History: Past Medical History:  Diagnosis Date   Allergy    Asthma    Chicken pox    Depression    reasonable control on venlafaxine 75mg  TID. xanax #14 lasts about 2 years for anxiety portion.    Diabetes mellitus    Genital warts    no outbreaks since the 80s   History of heart artery stent 02/10/2020   Hyperlipidemia    Hypertension    Past Surgical History:  Procedure Laterality Date   CORONARY ANGIOPLASTY WITH STENT PLACEMENT     2012,  [place]   DILATION AND CURETTAGE OF UTERUS     TONSILLECTOMY  1968   Family History  Problem Relation Age of Onset   Arthritis Mother    Ovarian cancer Mother    AAA (abdominal aortic aneurysm) Mother    Kidney disease Mother    Hypothyroidism Mother    Other Father        does not know family history for father   Stroke Brother        retired MD. [employer]   Heart attack Brother    Arthritis Brother        hip replacement   Hyperlipidemia Brother    Hypertension Brother    Dementia Maternal Grandmother    Colon cancer Neg Hx    Esophageal cancer Neg Hx     Rectal cancer Neg Hx    Stomach cancer Neg Hx    Social History   Socioeconomic History   Marital status: Divorced    Spouse name: Not on file   Number of children: Not on file   Years of education: Not on file   Highest education level: Not on file  Occupational History   Not on file  Tobacco Use   Smoking status: Every Day    Packs/day: 0.30    Years: 48.00    Pack years: 14.40    Types: Cigarettes   Smokeless tobacco: Never   Tobacco comments:    handouts provided   Vaping Use   Vaping Use: Never used  Substance and Sexual Activity   Alcohol use: Yes    Alcohol/week: 4.0 standard drinks    Types: 4 Cans of beer per week   Drug use: No   Sexual activity: Yes  Other Topics Concern   Not on file  Social History Narrative   Divorced. Did not ask about children.       Lives with mother   BS undergrad.    Now struggles to find work and has financial struggles- getting on medicare has helped.    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 Does not "mow or blow"   Social Determinants of Health   Financial Resource Strain: Low Risk    Difficulty of Paying Living Expenses: Not hard at all  Food Insecurity: No Food Insecurity   Worried About Charity fundraiser in the Last Year: Never true   [place] in the Last Year: Never true  Transportation Needs: No Transportation Needs   Lack of Transportation (Medical): No   Lack of Transportation (Non-Medical): No  Physical Activity: Inactive   Days of Exercise per Week: 0 days   Minutes of Exercise per Session: 0 min  Stress: Stress Concern Present   Feeling of Stress : To some extent  Social Connections: Socially Isolated   Frequency of Communication with Friends and Family: Twice a week   Frequency of Social Gatherings with Friends and Family: Never   Attends Religious Services: 1 to 4 times per year   Active Member of [employer] or  Organizations: No   Attends Music therapist: Never   Marital Status: Divorced    Tobacco Counseling Ready to quit: Not Answered Counseling given: Not Answered Tobacco comments: handouts provided    Clinical Intake:  Pre-visit preparation completed: Yes  Pain : 0-10 Pain Score: 3  Pain Type: Chronic pain Pain Location: Back Pain Orientation: Lower  Pain Descriptors / Indicators: Aching, Sharp Pain Onset: More than a month ago Pain Frequency: Intermittent     BMI - recorded: 29.6 Nutritional Status: BMI 25 -29 Overweight Nutritional Risks: None Diabetes: Yes CBG done?: No Did pt. bring in CBG monitor from home?: No  How often do you need to have someone help you when you read instructions, pamphlets, or other written materials from your doctor or pharmacy?: 1 - Never  Diabetic?Nutrition Risk Assessment:  Has the patient had any N/V/D within the last 2 months?  No  Does the patient have any non-healing wounds?  No  Has the patient had any unintentional weight loss or weight gain?  No   Diabetes:  Is the patient diabetic?  Yes  If diabetic, was a CBG obtained today?  No  Did the patient bring in their glucometer from home?  No  How often do you monitor your CBG's? N/A.   Financial Strains and Diabetes Management:  Are you having any financial strains with the device, your supplies or your medication? No .  Does the patient want to be seen by Chronic Care Management for management of their diabetes?  No  Would the patient like to be referred to a Nutritionist or for Diabetic Management?  No   Diabetic Exams:  Diabetic Eye Exam: Completed 03/06/21 Diabetic Foot Exam: Completed 12/26/20   Interpreter Needed?: No  Information entered by :: Charlott Rakes, LPN   Activities of Daily Living In your present state of health, do you have any difficulty performing the following activities: 05/26/2021 05/10/2021  Hearing? N N  Vision? N N  Difficulty  concentrating or making decisions? N N  Walking or climbing stairs? N Y  Dressing or bathing? N N  Doing errands, shopping? N N  Preparing Food and eating ? N -  Using the Toilet? N -  In the past six months, have you accidently leaked urine? Y -  Do you have problems with loss of bowel control? N -  Managing your Medications? N -  Managing your Finances? N -  Housekeeping or managing your Housekeeping? N -  Some recent data might be hidden    Patient Care Team: Leamon Arnt, MD as PCP - General (Family Medicine) Jerline Pain, MD as PCP - Cardiology (Cardiology) Madelin Rear, [facility] (Pharmacist) Madelin Rear, [facility] as Pharmacist (Pharmacist)  Indicate any recent Medical Services you may have received from other than [facility] providers in the past year (date may be approximate).     Assessment:   This is a routine wellness examination for [facility].  Hearing/Vision screen Hearing Screening - Comments:: Pt denies any hearing issues  Vision Screening - Comments:: Pt follows up with [place] opthalmology   Dietary issues and exercise activities discussed: Current Exercise Habits: The patient does not participate in regular exercise at present   Goals Addressed             This Visit's Progress    Patient Stated       Lose 30 lbs         Depression Screen PHQ 2/9 Scores 05/26/2021 12/26/2020 09/17/2019 09/08/2019 04/02/2019 01/06/2019 08/06/2018  PHQ - 2 Score 0 4 1 0 1 1 2   PHQ- 9 Score - 7 8 - 4 - 11    Fall Risk Fall Risk  05/26/2021 12/26/2020 09/08/2019 01/06/2019 08/06/2018  Falls in the past year? 1 0 0 1 Yes  Comment - - - Pt has bad knee and vertigo -  Number falls in past yr: 1 0 - 1 2 or more  Injury with Fall? 1 0 0 1 No  Comment bruises - - Pt hit her head about 2 weeks ago other falls ended in small bruises  -  Risk Factor Category  - - - - -  Follow up - - Education provided;Falls prevention discussed;Falls evaluation completed - -    FALL RISK PREVENTION  PERTAINING TO THE HOME:  Any stairs in or around the home? Yes  If so, are there any without handrails? No  Home free of loose throw rugs in walkways, pet beds, electrical cords, etc? Yes  Adequate lighting in your home to reduce risk of falls? Yes   ASSISTIVE DEVICES UTILIZED TO PREVENT FALLS:  Life alert? No  Use of a cane, walker or w/c? No  Grab bars in the bathroom? No  Shower chair or bench in shower? No  Elevated toilet seat or a handicapped toilet? No   TIMED UP AND GO:  Was the test performed? Yes .  Length of time to ambulate 10 feet: 10 sec.   Gait steady and fast without use of assistive device  Cognitive Function:     6CIT Screen 05/26/2021 09/08/2019 08/06/2018  What Year? 0 points 0 points 0 points  What month? 0 points 0 points 0 points  What time? 0 points 0 points 0 points  Count back from 20 0 points 0 points 0 points  Months in reverse 0 points 0 points 0 points  Repeat phrase 0 points 0 points 0 points  Total Score 0 0 0    Immunizations Immunization History  Administered Date(s) Administered   Fluad Quad(high Dose 65+) 08/05/2019, 08/18/2020   Influenza, High Dose Seasonal PF 08/20/2017, 08/06/2018   Influenza-Unspecified 09/24/2016   PFIZER Comirnaty(Gray Top)Covid-19 Tri-Sucrose Vaccine 04/10/2021   PFIZER(Purple Top)SARS-COV-2 Vaccination 12/15/2019, 01/05/2020   Pneumococcal Conjugate-13 09/10/2016   Pneumococcal Polysaccharide-23 12/02/2017   Tdap 08/20/2018, 12/05/2018   Zoster Recombinat (Shingrix) 06/05/2017, 09/18/2017    TDAP status: Up to date  Flu Vaccine status: Up to date  Pneumococcal vaccine status: Up to date  Covid-19 vaccine status: Completed vaccines  Qualifies for Shingles Vaccine? Yes   Zostavax completed Yes   Shingrix Completed?: Yes  Screening Tests Health Maintenance  Topic Date Due   INFLUENZA VACCINE  06/26/2021   COVID-19 Vaccine (4 - Booster for Pfizer series) 07/11/2021   MAMMOGRAM  08/25/2021    HEMOGLOBIN A1C  11/09/2021   FOOT EXAM  12/26/2021   OPHTHALMOLOGY EXAM  03/06/2022   DEXA SCAN  08/25/2022   COLONOSCOPY (Pts 45-38yrs Insurance coverage will need to be confirmed)  08/13/2023   TETANUS/TDAP  12/05/2028   PNA vac Low Risk Adult  Completed   Zoster Vaccines- Shingrix  Completed   Hepatitis C Screening  Addressed   HPV VACCINES  Aged Out    Health Maintenance  There are no preventive care reminders to display for this patient.  Colorectal cancer screening: Type of screening: Colonoscopy. Completed 08/12/18. Repeat every 5 years  Mammogram status: Completed 08/25/20. Repeat every year  Bone Density status: Completed 08/25/20. Results reflect: Bone density results: OSTEOPENIA. Repeat every 2 years.    Additional Screening:  Hepatitis C Screening:  Completed 10/23/16  Vision Screening: Recommended annual ophthalmology exams for early detection of glaucoma and other disorders of the eye. Is the patient up to date with their annual eye exam?  Yes  Who is the provider or what is the name  of the office in which the patient attends annual eye exams? [facility]  If pt is not established with a provider, would they like to be referred to a provider to establish care? No .   Dental Screening: Recommended annual dental exams for proper oral hygiene  Community Resource Referral / Chronic Care Management: CRR required this visit?  No   CCM required this visit?  No      Plan:     I have personally reviewed and noted the following in the patient's chart:   Medical and social history Use of alcohol, tobacco or illicit drugs  Current medications and supplements including opioid prescriptions.  Functional ability and status Nutritional status Physical activity Advanced directives List of other physicians Hospitalizations, surgeries, and ER visits in previous 12 months Vitals Screenings to include cognitive, depression, and falls Referrals and  appointments  In addition, I have reviewed and discussed with patient certain preventive protocols, quality metrics, and best practice recommendations. A written personalized care plan for preventive services as well as general preventive health recommendations were provided to patient.     Willette Brace, LPN   06/29/8349   Nurse Notes: none

## 2021-05-26 NOTE — Patient Instructions (Signed)
Please return in January for your annual complete physical; please come fasting.   Take care of yourself! I will release your lab results to you on your MyChart account with further instructions. Please reply with any questions.    If you have any questions or concerns, please don't hesitate to send me a message via MyChart or call the office at [phone]. Thank you for visiting with [place] today! It's our pleasure caring for you.

## 2021-05-26 NOTE — Patient Instructions (Signed)
Ms. Jessica Curry , Thank you for taking time to come for your Medicare Wellness Visit. I appreciate your ongoing commitment to your health goals. Please review the following plan we discussed and let me know if I can assist you in the future.   Screening recommendations/referrals: Colonoscopy: Done 08/12/18 repeat in 5  years 08/13/23 Mammogram: Done 08/25/20 repeat every year Bone Density: Done 08/25/20 repeat in 2 years 08/25/22 Recommended yearly ophthalmology/optometry visit for glaucoma screening and checkup Recommended yearly dental visit for hygiene and checkup  Vaccinations: Influenza vaccine: Due 06/26/21 Pneumococcal vaccine: Completed  Tdap vaccine: Done 12/05/18 repeat every 10 years 1/0/10/30 Shingles vaccine: Completed 7/11 & 09/18/17   Covid-19:Completed 1/19, 01/05/20 & 03/31/21  Advanced directives: Please bring a copy of your health care power of attorney and living will to the office at your convenience.  Conditions/risks identified: lose 30 lbs   Next appointment: Follow up in one year for your annual wellness visit    Preventive Care 70 Years and Older, Female Preventive care refers to lifestyle choices and visits with your health care provider that can promote health and wellness. What does preventive care include? A yearly physical exam. This is also called an annual well check. Dental exams once or twice a year. Routine eye exams. Ask your health care provider how often you should have your eyes checked. Personal lifestyle choices, including: Daily care of your teeth and gums. Regular physical activity. Eating a healthy diet. Avoiding tobacco and drug use. Limiting alcohol use. Practicing safe sex. Taking low-dose aspirin every day. Taking vitamin and mineral supplements as recommended by your health care provider. What happens during an annual well check? The services and screenings done by your health care provider during your annual well check will depend on your  age, overall health, lifestyle risk factors, and family history of disease. Counseling  Your health care provider may ask you questions about your: Alcohol use. Tobacco use. Drug use. Emotional well-being. Home and relationship well-being. Sexual activity. Eating habits. History of falls. Memory and ability to understand (cognition). Work and work Statistician. Reproductive health. Screening  You may have the following tests or measurements: Height, weight, and BMI. Blood pressure. Lipid and cholesterol levels. These may be checked every 5 years, or more frequently if you are over 22 years old. Skin check. Lung cancer screening. You may have this screening every year starting at age 25 if you have a 30-pack-year history of smoking and currently smoke or have quit within the past 15 years. Fecal occult blood test (FOBT) of the stool. You may have this test every year starting at age 66. Flexible sigmoidoscopy or colonoscopy. You may have a sigmoidoscopy every 5 years or a colonoscopy every 10 years starting at age 25. Hepatitis C blood test. Hepatitis B blood test. Sexually transmitted disease (STD) testing. Diabetes screening. This is done by checking your blood sugar (glucose) after you have not eaten for a while (fasting). You may have this done every 1-3 years. Bone density scan. This is done to screen for osteoporosis. You may have this done starting at age 52. Mammogram. This may be done every 1-2 years. Talk to your health care provider about how often you should have regular mammograms. Talk with your health care provider about your test results, treatment options, and if necessary, the need for more tests. Vaccines  Your health care provider may recommend certain vaccines, such as: Influenza vaccine. This is recommended every year. Tetanus, diphtheria, and acellular pertussis (Tdap, Td)  vaccine. You may need a Td booster every 10 years. Zoster vaccine. You may need this after  age 33. Pneumococcal 13-valent conjugate (PCV13) vaccine. One dose is recommended after age 15. Pneumococcal polysaccharide (PPSV23) vaccine. One dose is recommended after age 66. Talk to your health care provider about which screenings and vaccines you need and how often you need them. This information is not intended to replace advice given to you by your health care provider. Make sure you discuss any questions you have with your health care provider. Document Released: 12/09/2015 Document Revised: 08/01/2016 Document Reviewed: 09/13/2015 Elsevier Interactive Patient Education  2017 [place] Prevention in the Home Falls can cause injuries. They can happen to people of all ages. There are many things you can do to make your home safe and to help prevent falls. What can I do on the outside of my home? Regularly fix the edges of walkways and driveways and fix any cracks. Remove anything that might make you trip as you walk through a door, such as a raised step or threshold. Trim any bushes or trees on the path to your home. Use bright outdoor lighting. Clear any walking paths of anything that might make someone trip, such as rocks or tools. Regularly check to see if handrails are loose or broken. Make sure that both sides of any steps have handrails. Any raised decks and porches should have guardrails on the edges. Have any leaves, snow, or ice cleared regularly. Use sand or salt on walking paths during winter. Clean up any spills in your garage right away. This includes oil or grease spills. What can I do in the bathroom? Use night lights. Install grab bars by the toilet and in the tub and shower. Do not use towel bars as grab bars. Use non-skid mats or decals in the tub or shower. If you need to sit down in the shower, use a plastic, non-slip stool. Keep the floor dry. Clean up any water that spills on the floor as soon as it happens. Remove soap buildup in the tub or shower  regularly. Attach bath mats securely with double-sided non-slip rug tape. Do not have throw rugs and other things on the floor that can make you trip. What can I do in the bedroom? Use night lights. Make sure that you have a light by your bed that is easy to reach. Do not use any sheets or blankets that are too big for your bed. They should not hang down onto the floor. Have a firm chair that has side arms. You can use this for support while you get dressed. Do not have throw rugs and other things on the floor that can make you trip. What can I do in the kitchen? Clean up any spills right away. Avoid walking on wet floors. Keep items that you use a lot in easy-to-reach places. If you need to reach something above you, use a strong step stool that has a grab bar. Keep electrical cords out of the way. Do not use floor polish or wax that makes floors slippery. If you must use wax, use non-skid floor wax. Do not have throw rugs and other things on the floor that can make you trip. What can I do with my stairs? Do not leave any items on the stairs. Make sure that there are handrails on both sides of the stairs and use them. Fix handrails that are broken or loose. Make sure that handrails are as long  as the stairways. Check any carpeting to make sure that it is firmly attached to the stairs. Fix any carpet that is loose or worn. Avoid having throw rugs at the top or bottom of the stairs. If you do have throw rugs, attach them to the floor with carpet tape. Make sure that you have a light switch at the top of the stairs and the bottom of the stairs. If you do not have them, ask someone to add them for you. What else can I do to help prevent falls? Wear shoes that: Do not have high heels. Have rubber bottoms. Are comfortable and fit you well. Are closed at the toe. Do not wear sandals. If you use a stepladder: Make sure that it is fully opened. Do not climb a closed stepladder. Make sure that  both sides of the stepladder are locked into place. Ask someone to hold it for you, if possible. Clearly mark and make sure that you can see: Any grab bars or handrails. First and last steps. Where the edge of each step is. Use tools that help you move around (mobility aids) if they are needed. These include: Canes. Walkers. Scooters. Crutches. Turn on the lights when you go into a dark area. Replace any light bulbs as soon as they burn out. Set up your furniture so you have a clear path. Avoid moving your furniture around. If any of your floors are uneven, fix them. If there are any pets around you, be aware of where they are. Review your medicines with your doctor. Some medicines can make you feel dizzy. This can increase your chance of falling. Ask your doctor what other things that you can do to help prevent falls. This information is not intended to replace advice given to you by your health care provider. Make sure you discuss any questions you have with your health care provider. Document Released: 09/08/2009 Document Revised: 04/19/2016 Document Reviewed: 12/17/2014 Elsevier Interactive Patient Education  2017 [employer].

## 2021-05-26 NOTE — Progress Notes (Signed)
Subjective  CC:  Chief Complaint  Patient presents with   Follow-up    F/u with repeat labs    HPI: Jessica Curry is a 70 y.o. female who presents to the office today to address the problems listed above in the chief complaint. F/u after hospitalization for sepsis, unclear source. See last note for summary.  Doing much better. Energy has improved, no more fever or malaise. No urinary sxs. No cough or sob. Back pain comes and goes but better overall. Has completed abx, potassium supp and magnesium. Due for recheck labs and renal function today.  Back pain, chronic: doesn't feel that meloxicam helps. Diclofenac helps more. Used tylenol today with some relief.    Assessment  1. Sepsis with acute organ dysfunction and septic shock, due to unspecified organism, unspecified type ([place])   2. Chronic hyponatremia   3. Hypertension associated with diabetes ([place])   4. Chronic kidney disease (CKD) stage G3a/A1, moderately decreased glomerular filtration rate (GFR) between 45-59 mL/min/1.73 square meter and albuminuria creatinine ratio less than 30 mg/g ([facility])   5. Hypomagnesemia      Plan  sepsis:  resolved. F/u urine culture to be certain given recurrent UTI history Electrolytes: recheck sodium, mag and potassium.  Back pain. Rec tylenol ES bid and prn diclofenac. Stop meloxicam.  HTN: much better controlled now that feeling better. Tolerating meds.  Recheck renal function.   Follow up: as scheduled.   06/13/2021  Orders Placed This Encounter  Procedures   Urine Culture   CBC with Differential/Platelet   Basic metabolic panel   Magnesium   No orders of the defined types were placed in this encounter.     I reviewed the patients updated PMH, FH, and SocHx.    Patient Active Problem List   Diagnosis Date Noted   History of heart artery stent 02/10/2020    Priority: High   Simple chronic bronchitis ([place]) 02/11/2018    Priority: High   Hyperlipidemia associated with type 2  diabetes mellitus ([place]) 06/04/2017    Priority: High   Diabetes mellitus type II, controlled ([place]) 09/10/2016    Priority: High   CAD (coronary artery disease), native coronary artery 09/10/2016    Priority: High   Hypertension associated with diabetes ([place]) 09/10/2016    Priority: High   Smoker 09/10/2016    Priority: High   Depression, major, recurrent, in partial remission ([place])     Priority: High   Chronic hyponatremia 02/07/2021    Priority: Medium   Bilateral renal cysts 02/06/2021    Priority: Medium   Chronic midline low back pain without sciatica 12/26/2020    Priority: Medium   Serrated polyp of colon 08/12/2018    Priority: Medium   Osteopenia 06/10/2017    Priority: Medium   Encounter for screening for lung cancer 02/10/2020    Priority: Low   Mild intermittent asthma 08/06/2019    Priority: Low   Severe sepsis ([place]) 05/11/2021   Fever 05/10/2021   Alcohol use 02/07/2021   Chronic kidney disease (CKD) stage G3a/A1, moderately decreased glomerular filtration rate (GFR) between 45-59 mL/min/1.73 square meter and albuminuria creatinine ratio less than 30 mg/g ([facility]) 02/07/2021   Current Meds  Medication Sig   albuterol (PROAIR HFA) 108 (90 Base) MCG/ACT inhaler Inhale 2 puffs into the lungs every 6 (six) hours as needed for wheezing or shortness of breath.   ALPRAZolam (XANAX) 0.5 MG tablet Take 1 tablet (0.5 mg total) by mouth daily as needed for  anxiety.   aspirin 81 MG tablet Take 81 mg by mouth at bedtime.   atorvastatin (LIPITOR) 20 MG tablet Take 20 mg by mouth daily.   cloNIDine (CATAPRES) 0.1 MG tablet TAKE 1 TABLET BY MOUTH 3  TIMES DAILY   diphenhydrAMINE (BENADRYL) 25 MG tablet Take 25 mg by mouth every 6 (six) hours as needed for allergies or sleep.   fluocinonide (LIDEX) 0.05 % external solution Apply 1 application topically daily.   lisinopril (ZESTRIL) 40 MG tablet Take 1 tablet (40 mg total) by mouth daily.   magnesium oxide (MAG-OX) 400 MG tablet Take  1 tablet (400 mg total) by mouth daily for 14 days.   meloxicam (MOBIC) 15 MG tablet Take 1 tablet (15 mg total) by mouth daily.   metFORMIN (GLUCOPHAGE) 1000 MG tablet TAKE 1 TABLET BY MOUTH  TWICE DAILY WITH MEALS   Multiple Vitamins-Minerals (MULTIVITAMIN WITH MINERALS) tablet Take 1 tablet by mouth daily. Centrum silver for women   venlafaxine XR (EFFEXOR-XR) 75 MG 24 hr capsule TAKE THREE CAPSULES BY MOUTH DAILY WITH BREAKFAST    Allergies: Patient has No Known Allergies. Family History: Patient family history includes AAA (abdominal aortic aneurysm) in her mother; Arthritis in her brother and mother; Dementia in her maternal grandmother; Heart attack in her brother; Hyperlipidemia in her brother; Hypertension in her brother; Hypothyroidism in her mother; Kidney disease in her mother; Other in her father; Ovarian cancer in her mother; Stroke in her brother. Social History:  Patient  reports that she has been smoking cigarettes. She has a 14.40 pack-year smoking history. She has never used smokeless tobacco. She reports current alcohol use of about 4.0 standard drinks of alcohol per week. She reports that she does not use drugs.  Review of Systems: Constitutional: Negative for fever malaise or anorexia Cardiovascular: negative for chest pain Respiratory: negative for SOB or persistent cough Gastrointestinal: negative for abdominal pain  Objective  Vitals: BP 130/78   Pulse (!) 54   Temp 97.6 F (36.4 C) (Temporal)   Ht 5\' 7"  (1.702 m)   Wt 189 lb (85.7 kg)   LMP  (LMP Unknown)   SpO2 98%   BMI 29.60 kg/m  General: no acute distress , A&Ox3, looks much better HEENT: PEERL, conjunctiva normal, neck is supple, bruise over left eyelid Cardiovascular:  RRR without murmur or gallop.  Respiratory:  Good breath sounds bilaterally, CTAB with normal respiratory effort Benign abdomen Skin:  Warm, no rashes    Commons side effects, risks, benefits, and alternatives for medications and  treatment plan prescribed today were discussed, and the patient expressed understanding of the given instructions. Patient is instructed to call or message via MyChart if he/she has any questions or concerns regarding our treatment plan. No barriers to understanding were identified. We discussed Red Flag symptoms and signs in detail. Patient expressed understanding regarding what to do in case of urgent or emergency type symptoms.  Medication list was reconciled, printed and provided to the patient in AVS. Patient instructions and summary information was reviewed with the patient as documented in the AVS. This note was prepared with assistance of Dragon voice recognition software. Occasional wrong-word or sound-a-like substitutions may have occurred due to the inherent limitations of voice recognition software  This visit occurred during the SARS-CoV-2 public health emergency.  Safety protocols were in place, including screening questions prior to the visit, additional usage of staff PPE, and extensive cleaning of exam room while observing appropriate contact time as indicated for disinfecting  solutions.

## 2021-05-27 LAB — URINE CULTURE
MICRO NUMBER:: 12075179
SPECIMEN QUALITY:: ADEQUATE

## 2021-05-30 ENCOUNTER — Other Ambulatory Visit: Payer: Self-pay | Admitting: Family Medicine

## 2021-05-30 ENCOUNTER — Telehealth: Payer: Self-pay

## 2021-05-30 NOTE — Telephone Encounter (Signed)
Refill request sent to Dr. Jerline Pain

## 2021-05-30 NOTE — Telephone Encounter (Signed)
  LAST APPOINTMENT DATE: 05/26/2021   NEXT APPOINTMENT DATE:@7 /19/2022  MEDICATION: ALPRAZolam Duanne Moron) 0.5 MG tablet [place], [place]

## 2021-05-30 NOTE — Telephone Encounter (Signed)
Last refill: 02/01/21 #30, 0 Last OV: 05/26/21 dx. Hospital f/u

## 2021-06-01 ENCOUNTER — Telehealth: Payer: Self-pay

## 2021-06-01 NOTE — Telephone Encounter (Signed)
Patient returned call regarding lab results

## 2021-06-02 NOTE — Telephone Encounter (Signed)
Pt calling about lab results. Please advise.

## 2021-06-05 ENCOUNTER — Telehealth: Payer: Self-pay

## 2021-06-05 NOTE — Telephone Encounter (Signed)
LMOVM to return call for lab results

## 2021-06-05 NOTE — Progress Notes (Signed)
Please call patient: I have reviewed his/her lab results. All lab results have improved or normalized. Urine culture is negative.  Nothing further needed at this time

## 2021-06-06 NOTE — Telephone Encounter (Signed)
Spoke to pt told her per Dr. Jonni Sanger, I have reviewed her lab results. All lab results have improved or normalized. Urine culture is negative. Nothing further needed at this time. Pt verbalized understanding.

## 2021-06-06 NOTE — Telephone Encounter (Signed)
Patient calling back about lab results.  °

## 2021-06-12 DIAGNOSIS — D2261 Melanocytic nevi of right upper limb, including shoulder: Secondary | ICD-10-CM | POA: Diagnosis not present

## 2021-06-12 DIAGNOSIS — D225 Melanocytic nevi of trunk: Secondary | ICD-10-CM | POA: Diagnosis not present

## 2021-06-12 DIAGNOSIS — D1801 Hemangioma of skin and subcutaneous tissue: Secondary | ICD-10-CM | POA: Diagnosis not present

## 2021-06-12 DIAGNOSIS — L218 Other seborrheic dermatitis: Secondary | ICD-10-CM | POA: Diagnosis not present

## 2021-06-12 DIAGNOSIS — L820 Inflamed seborrheic keratosis: Secondary | ICD-10-CM | POA: Diagnosis not present

## 2021-06-12 DIAGNOSIS — L814 Other melanin hyperpigmentation: Secondary | ICD-10-CM | POA: Diagnosis not present

## 2021-06-12 DIAGNOSIS — L821 Other seborrheic keratosis: Secondary | ICD-10-CM | POA: Diagnosis not present

## 2021-06-13 ENCOUNTER — Ambulatory Visit (INDEPENDENT_AMBULATORY_CARE_PROVIDER_SITE_OTHER): Payer: Medicare Other | Admitting: Pharmacist

## 2021-06-13 DIAGNOSIS — I152 Hypertension secondary to endocrine disorders: Secondary | ICD-10-CM

## 2021-06-13 DIAGNOSIS — E785 Hyperlipidemia, unspecified: Secondary | ICD-10-CM

## 2021-06-13 DIAGNOSIS — E1159 Type 2 diabetes mellitus with other circulatory complications: Secondary | ICD-10-CM | POA: Diagnosis not present

## 2021-06-13 DIAGNOSIS — E119 Type 2 diabetes mellitus without complications: Secondary | ICD-10-CM

## 2021-06-13 DIAGNOSIS — E1169 Type 2 diabetes mellitus with other specified complication: Secondary | ICD-10-CM | POA: Diagnosis not present

## 2021-06-13 NOTE — Progress Notes (Signed)
Chronic Care Management Pharmacy Note  06/16/2021 Name:  Jessica Curry MRN:  [id number] DOB:  [birth year]  Summary: PharmD follow up.  BP slightly elevated, patient needs to increase monitoring at home.  Recommendations/Changes made from today's visit: BP monitoring and follow up initiated  Plan: FU BP in 30 days 4 month PharmD   Subjective: Jessica Curry is an 70 y.o. year old female who is a primary patient of Leamon Arnt, MD.  The CCM team was consulted for assistance with disease management and care coordination needs.    Engaged with patient by telephone for follow up visit in response to provider referral for pharmacy case management and/or care coordination services.   Consent to Services:  The patient was given information about Chronic Care Management services, agreed to services, and gave verbal consent prior to initiation of services.  Please see initial visit note for detailed documentation.   Patient Care Team: Leamon Arnt, MD as PCP - General (Family Medicine) Jerline Pain, MD as PCP - Cardiology (Cardiology) Madelin Rear, [facility] (Pharmacist) Madelin Rear, [facility] as Pharmacist (Pharmacist)  Recent office visits: 05/26/21 Jonni Sanger) - sepsis resolved, will follow up with urine culture to be sure.  Stopped meloxicam, use Tylenol and prn diclofenac for back pain.  05/15/21 Jonni Sanger) - sepsis improving with ABX, BP elevated but missed dose of meds.  05/10/21 Jonni Sanger) - possible early sepsis, metformin held and all anti inflammatories.  Will monitor sodium and renal function. Recent consult visits: None recent  Hospital visits: 05/10/21 (ED to Hospital) - septic shock.  STOPPED rosuvastatin 36m daily and started Atorvastatin 281mdaily.   Objective:  Lab Results  Component Value Date   CREATININE 1.00 05/26/2021   BUN 15 05/26/2021   GFR 57.33 (L) 05/26/2021   GFRNONAA 55 (L) 05/12/2021   GFRAA 56 (L) 02/01/2021   NA 132 (L) 05/26/2021   K 4.9 05/26/2021    CALCIUM 10.1 05/26/2021   CO2 27 05/26/2021   GLUCOSE 99 05/26/2021    Lab Results  Component Value Date/Time   HGBA1C 6.6 (H) 05/10/2021 06:49 PM   HGBA1C 6.9 (H) 12/26/2020 03:00 PM   GFR 57.33 (L) 05/26/2021 10:10 AM   GFR 52.37 (L) 02/15/2021 03:14 PM    Last diabetic Eye exam:  Lab Results  Component Value Date/Time   HMDIABEYEEXA No Retinopathy 03/06/2021 12:00 AM    Last diabetic Foot exam: No results found for: HMDIABFOOTEX   Lab Results  Component Value Date   CHOL 158 12/26/2020   HDL 43.10 12/26/2020   LDLCALC 78 09/15/2020   LDLDIRECT 96.0 12/26/2020   TRIG 226.0 (H) 12/26/2020   CHOLHDL 4 12/26/2020    Hepatic Function Latest Ref Rng & Units 05/11/2021 05/10/2021 02/06/2021  Total Protein 6.5 - 8.1 g/dL 7.2 7.2 7.1  Albumin 3.5 - 5.0 g/dL 3.6 3.8 3.5  AST 15 - 41 U/L 52(H) 41 28  ALT 0 - 44 U/L 42 38 28  Alk Phosphatase 38 - 126 U/L 118 111 135(H)  Total Bilirubin 0.3 - 1.2 mg/dL 0.4 0.5 0.6    Lab Results  Component Value Date/Time   TSH 1.971 05/10/2021 07:29 PM   TSH 1.807 02/07/2021 02:51 AM   TSH 1.55 09/15/2020 10:25 AM   TSH 1.61 02/10/2020 03:11 PM    CBC Latest Ref Rng & Units 05/26/2021 05/12/2021 05/11/2021  WBC 4.0 - 10.5 K/uL 5.0 4.5 8.4  Hemoglobin 12.0 - 15.0 g/dL 12.7 11.2(L) 13.6  Hematocrit 36.0 -  46.0 % 37.1 31.7(L) 38.8  Platelets 150.0 - 400.0 K/uL 359.0 171 195    No results found for: VD25OH  Clinical ASCVD: No  The 10-year ASCVD risk score Mikey Bussing DC Jr., et al., 2013) is: 56.7%   Values used to calculate the score:     Age: 26 years     Sex: Female     Is Non-Hispanic African American: No     Diabetic: Yes     Tobacco smoker: Yes     Systolic Blood Pressure: 771 mmHg     Is BP treated: Yes     HDL Cholesterol: 43.1 mg/dL     Total Cholesterol: 158 mg/dL    Depression screen [facility] 2/9 05/26/2021 12/26/2020 09/17/2019  Decreased Interest 0 2 0  Down, Depressed, Hopeless 0 2 1  PHQ - 2 Score 0 4 1  Altered sleeping - 1 2   Tired, decreased energy - 2 -  Change in appetite - 0 2  Feeling bad or failure about yourself  - 0 3  Trouble concentrating - 0 0  Moving slowly or fidgety/restless - 0 0  Suicidal thoughts - 0 0  PHQ-9 Score - 7 8  Difficult doing work/chores - Somewhat difficult Somewhat difficult     Social History   Tobacco Use  Smoking Status Every Day   Packs/day: 0.30   Years: 48.00   Pack years: 14.40   Types: Cigarettes  Smokeless Tobacco Never  Tobacco Comments   handouts provided    BP Readings from Last 3 Encounters:  06/14/21 (!) 188/100  05/26/21 130/78  05/26/21 130/78   Pulse Readings from Last 3 Encounters:  06/14/21 (!) 55  05/26/21 (!) 54  05/26/21 (!) 54   Wt Readings from Last 3 Encounters:  06/14/21 192 lb 9.6 oz (87.4 kg)  05/26/21 189 lb (85.7 kg)  05/26/21 189 lb (85.7 kg)   BMI Readings from Last 3 Encounters:  06/14/21 30.17 kg/m  05/26/21 29.60 kg/m  05/26/21 29.60 kg/m    Assessment/Interventions: Review of patient past medical history, allergies, medications, health status, including review of consultants reports, laboratory and other test data, was performed as part of comprehensive evaluation and provision of chronic care management services.   SDOH:  (Social Determinants of Health) assessments and interventions performed: Yes  Financial Resource Strain: Low Risk    Difficulty of Paying Living Expenses: Not hard at all    SDOH Screenings   Alcohol Screen: Not on file  Depression (PHQ2-9): Low Risk    PHQ-2 Score: 0  Financial Resource Strain: Low Risk    Difficulty of Paying Living Expenses: Not hard at all  Food Insecurity: No Food Insecurity   Worried About Charity fundraiser in the Last Year: Never true   Arboriculturist in the Last Year: Never true  Housing: Low Risk    Last Housing Risk Score: 0  Physical Activity: Inactive   Days of Exercise per Week: 0 days   Minutes of Exercise per Session: 0 min  Social Connections:  Socially Isolated   Frequency of Communication with Friends and Family: Twice a week   Frequency of Social Gatherings with Friends and Family: Never   Attends Religious Services: 1 to 4 times per year   Active Member of [employer] or Organizations: No   Attends Archivist Meetings: Never   Marital Status: Divorced  Stress: Stress Concern Present   Feeling of Stress : To some extent  Tobacco Use: High  Risk   Smoking Tobacco Use: Every Day   Smokeless Tobacco Use: Never  Transportation Needs: No Transportation Needs   Lack of Transportation (Medical): No   Lack of Transportation (Non-Medical): No    CCM Care Plan  No Known Allergies  Medications Reviewed Today     Reviewed by Edythe Clarity, [facility] (Pharmacist) on 06/16/21 at [place] List Status: <None>   Medication Order Taking? Sig Documenting Provider Last Dose Status Informant  albuterol (PROAIR HFA) 108 (90 Base) MCG/ACT inhaler [id number] Yes Inhale 2 puffs into the lungs every 6 (six) hours as needed for wheezing or shortness of breath. Rigoberto Noel, MD Taking Active Self           Med Note Maud Deed   Wed May 10, 2021  2:28 PM) Needs refill  ALPRAZolam Duanne Moron) 0.5 MG tablet [id number] Yes TAKE ONE TABLET BY MOUTH DAILY AS NEEDED FOR ANXIETY Vivi Barrack, MD Taking Active   amLODipine (NORVASC) 5 MG tablet [id number] Yes Take 1 tablet (5 mg total) by mouth daily. Leamon Arnt, MD Taking Active   aspirin 81 MG tablet [id number] Yes Take 81 mg by mouth at bedtime. [provider] Taking Active Self  atorvastatin (LIPITOR) 20 MG tablet [id number] Yes Take 20 mg by mouth daily. [provider] Taking Active Self  cloNIDine (CATAPRES) 0.1 MG tablet [id number] Yes TAKE 1 TABLET BY MOUTH 3  TIMES DAILY Leamon Arnt, MD Taking Active   diphenhydrAMINE (BENADRYL) 25 MG tablet [id number] Yes Take 25 mg by mouth every 6 (six) hours as needed for allergies or sleep. [provider] Taking Active  Self  fluocinonide (LIDEX) 0.05 % external solution [id number] Yes Apply 1 application topically daily. [provider] Taking Active Self  lisinopril (ZESTRIL) 40 MG tablet [id number] Yes Take 1 tablet (40 mg total) by mouth daily. Leamon Arnt, MD Taking Active Self  meloxicam [facility]) 15 MG tablet [id number] Yes Take 1 tablet (15 mg total) by mouth daily. Leamon Arnt, MD Taking Active Self  metFORMIN (GLUCOPHAGE) 1000 MG tablet [id number] Yes TAKE 1 TABLET BY MOUTH  TWICE DAILY WITH MEALS Marin Olp, MD Taking Active   Multiple Vitamins-Minerals (MULTIVITAMIN WITH MINERALS) tablet [id number] Yes Take 1 tablet by mouth daily. Centrum silver for women [provider] Taking Active Self  nitroGLYCERIN (NITROSTAT) 0.4 MG SL tablet [id number]  Place 1 tablet (0.4 mg total) under the tongue every 5 (five) minutes as needed for chest pain. Burtis Junes, NP  Expired 05/10/21 2359 Self           Med Note Broadus John, REGEENA   Wed May 10, 2021  2:35 PM) Needs refill  venlafaxine XR (EFFEXOR-XR) 75 MG 24 hr capsule [id number] Yes TAKE THREE CAPSULES BY MOUTH DAILY WITH BREAKFAST Leamon Arnt, MD Taking Active Self            Patient Active Problem List   Diagnosis Date Noted   History of sepsis 05/11/2021   Alcohol use 02/07/2021   Chronic kidney disease, stage 3a ([place]) 02/07/2021   Chronic hyponatremia 02/07/2021   Bilateral renal cysts 02/06/2021   Chronic midline low back pain without sciatica 12/26/2020   Encounter for screening for lung cancer 02/10/2020   History of heart artery stent 02/10/2020   Mild intermittent asthma 08/06/2019   Serrated polyp of colon 08/12/2018   Simple chronic bronchitis ([place]) 02/11/2018   Osteopenia 06/10/2017   Hyperlipidemia associated with type 2 diabetes mellitus (  [place]) 06/04/2017   Diabetes mellitus type II, controlled ([place]) 09/10/2016   CAD (coronary artery disease), native coronary artery 09/10/2016   Hypertension associated  with diabetes ([place]) 09/10/2016   Smoker 09/10/2016   Depression, major, recurrent, in partial remission ([place])     Immunization History  Administered Date(s) Administered   Fluad Quad(high Dose 65+) 08/05/2019, 08/18/2020   Influenza, High Dose Seasonal PF 08/20/2017, 08/06/2018   Influenza-Unspecified 09/24/2016   PFIZER Comirnaty(Gray Top)Covid-19 Tri-Sucrose Vaccine 04/10/2021   PFIZER(Purple Top)SARS-COV-2 Vaccination 12/15/2019, 01/05/2020   Pneumococcal Conjugate-13 09/10/2016   Pneumococcal Polysaccharide-23 12/02/2017   Tdap 08/20/2018, 12/05/2018   Zoster Recombinat (Shingrix) 06/05/2017, 09/18/2017    Conditions to be addressed/monitored:  Hypertension, Hyperlipidemia, and Diabetes  Care Plan : General Pharmacy (Adult)  Updates made by Edythe Clarity, RPH since 06/16/2021 12:00 AM     Problem: HTN, HLD, DM   Priority: High  Onset Date: 06/13/2021     Long-Range Goal: Patient-Specific Goal   Start Date: 06/13/2021  Expected End Date: 12/17/2021  This Visit's Progress: On track  Priority: High  Note:   Current Barriers:  Unable to independently monitor therapeutic efficacy  Pharmacist Clinical Goal(s):  Patient will achieve adherence to monitoring guidelines and medication adherence to achieve therapeutic efficacy adhere to plan to optimize therapeutic regimen for HTN as evidenced by report of adherence to recommended medication management changes contact provider office for questions/concerns as evidenced notation of same in electronic health record through collaboration with PharmD and provider.   Interventions: 1:1 collaboration with Leamon Arnt, MD regarding development and update of comprehensive plan of care as evidenced by provider attestation and co-signature Inter-disciplinary care team collaboration (see longitudinal plan of care) Comprehensive medication review performed; medication list updated in electronic medical record  Hypertension (BP goal  <140/90) -Controlled -Current treatment: Lisinopril 29m daily Clonidine 0.141mdaily -Medications previously tried: none noted  -Current home readings: no logs, office BP normal recently  -Denies hypotensive/hypertensive symptoms -Educated on BP goals and benefits of medications for prevention of heart attack, stroke and kidney damage; Daily salt intake goal < 2300 mg; Exercise goal of 150 minutes per week; Importance of home blood pressure monitoring; Symptoms of hypotension and importance of maintaining adequate hydration; -Counseled to monitor BP at home a few times per week at least, document, and provide log at future appointments -Patient mentions some recent falls due to accidental falls, no dizziness. -Recommended to continue current medication Recommended increased home monitoring, could consider addition of amlodipine if BP continues to be elevated  Osteoporosis / Osteopenia (Goal Maintain bone density) -Controlled -Last DEXA Scan: 08/25/20   T-Score femoral neck: -1.5   -Patient is not a candidate for pharmacologic treatment -Current treatment  None currently -Medications previously tried: none noted  -Recommend [phone] units of vitamin D daily. Recommend 1200 mg of calcium daily from dietary and supplemental sources. Recommend weight-bearing and muscle strengthening exercises for building and maintaining bone density. -Recommended repeat DEXA 2 years from previous  Diabetes (A1c goal <7%) -Controlled -Current medications: Metformin 100029mID with meals -Medications previously tried: none noted  -Current home glucose readings fasting glucose: no logs post prandial glucose: no logs -Denies hypoglycemic/hyperglycemic symptoms  -Educated on A1c and blood sugar goals; Complications of diabetes including kidney damage, retinal damage, and cardiovascular disease; Exercise goal of 150 minutes per week; Prevention and management of hypoglycemic episodes; -Counseled  to check feet daily and get yearly eye exams -Counseled on diet and exercise extensively Recommended to continue current medication  Patient Goals/Self-Care Activities Patient will:  - take medications as prescribed focus on medication adherence by pill box check glucose daily, document, and provide at future appointments check blood pressure daily, document, and provide at future appointments  Follow Up Plan: The care management team will reach out to the patient again over the next 120 days.       Medication Assistance: None required.  Patient affirms current coverage meets needs.  Compliance/Adherence/Medication fill history: Care Gaps: None noted    Patient's preferred pharmacy is:  [facility], [place] [address] Phone: [phone] Fax: [phone]  [facility]  ([place], [address] [place] [address] Phone: [phone] Fax: [phone]  Uses pill box? Yes Pt endorses 100% compliance  We discussed: Benefits of medication synchronization, packaging and delivery as well as enhanced pharmacist oversight with [employer]. Patient decided to: Continue current medication management strategy  Care Plan and Follow Up Patient Decision:  Patient agrees to Care Plan and Follow-up.  Plan: The care management team will reach out to the patient again over the next 120 days.  Beverly Milch, PharmD Clinical Pharmacist [phone]

## 2021-06-14 ENCOUNTER — Other Ambulatory Visit: Payer: Self-pay

## 2021-06-14 ENCOUNTER — Encounter: Payer: Self-pay | Admitting: Family Medicine

## 2021-06-14 ENCOUNTER — Ambulatory Visit (INDEPENDENT_AMBULATORY_CARE_PROVIDER_SITE_OTHER): Payer: Medicare Other | Admitting: Family Medicine

## 2021-06-14 VITALS — BP 188/100 | HR 55 | Temp 97.8°F | Wt 192.6 lb

## 2021-06-14 DIAGNOSIS — R296 Repeated falls: Secondary | ICD-10-CM

## 2021-06-14 DIAGNOSIS — Z8619 Personal history of other infectious and parasitic diseases: Secondary | ICD-10-CM | POA: Diagnosis not present

## 2021-06-14 DIAGNOSIS — F17200 Nicotine dependence, unspecified, uncomplicated: Secondary | ICD-10-CM

## 2021-06-14 DIAGNOSIS — M8580 Other specified disorders of bone density and structure, unspecified site: Secondary | ICD-10-CM | POA: Diagnosis not present

## 2021-06-14 DIAGNOSIS — E1159 Type 2 diabetes mellitus with other circulatory complications: Secondary | ICD-10-CM | POA: Diagnosis not present

## 2021-06-14 DIAGNOSIS — I152 Hypertension secondary to endocrine disorders: Secondary | ICD-10-CM | POA: Diagnosis not present

## 2021-06-14 DIAGNOSIS — M545 Low back pain, unspecified: Secondary | ICD-10-CM

## 2021-06-14 DIAGNOSIS — G8929 Other chronic pain: Secondary | ICD-10-CM | POA: Diagnosis not present

## 2021-06-14 DIAGNOSIS — N1831 Chronic kidney disease, stage 3a: Secondary | ICD-10-CM | POA: Diagnosis not present

## 2021-06-14 MED ORDER — AMLODIPINE BESYLATE 5 MG PO TABS: 5.0000 mg | ORAL_TABLET | Freq: Every day | ORAL | 3 refills | Status: DC

## 2021-06-14 NOTE — Patient Instructions (Signed)
Please return in 3 months to recheck blood pressure.   Please monitor your blood pressures; if they are consistently higher than 140/90, then start the new blood pressure medication: amlodipine 5mg  daily.  Continue your clonodine 3x/day and your lisinopril every morning.   If you have any questions or concerns, please don't hesitate to send me a message via MyChart or call the office at [phone]. Thank you for visiting with [place] today! It's our pleasure caring for you.

## 2021-06-14 NOTE — Progress Notes (Signed)
Subjective  CC:  Chief Complaint  Patient presents with   Jessica Curry last night on her left side and injured right knee    Osteoporosis    Wanting to discuss starting prolia injections     HPI: Jessica Curry is a 70 y.o. female who presents to the office today to address the problems listed above in the chief complaint. Falls x2: Patient reports she was pulling on the upper rack of her dishwasher trying to fix that and lost her balance falling to the left side yesterday.  She is sore on that side but no significant injuries.  She also reports that she was working her garden and went to sit down on her stool, missed the seat and fell to the ground.  No injuries were encountered at that time.  She has no other falls.  Both falls were accidental. History of sepsis follow-up: Fortunately, she has no residual or recurring symptoms of sepsis.  Energy level is good.  Appetite is good.  No urinary symptoms.  No fevers or chills. Mild chronic kidney disease: I reviewed recent labs.  No edema Osteopenia: We reviewed her most recent bone density scan from September 2021.  She wants to be certain not to miss treatment for osteoporosis given her mother's history. Low back pain: Currently stable. HTN: Had been well controlled.  Elevated today, may be related to pain response due to recent fall.  She does check at home.  She reports readings are mostly controlled although she will have some elevated readings, 140s over 90s.  Assessment  1. Frequent falls   2. History of sepsis   3. Chronic kidney disease, stage 3a ([place])   4. Chronic midline low back pain without sciatica   5. Hypertension associated with diabetes ([place])   6. Smoker   7. Osteopenia, unspecified location      Plan  History of sepsis follow-up: Fortunately, she remained stable.  No further work-up indicated at this time.  Recommend monitoring urine status closely and keeping well-hydrated.  She knows that if she develops symptoms,  she returns to the office quickly for evaluation Falls: No red flags identified.  Fall prevention discussed Osteopenia: Reiterated treatment and preventive measures with weightbearing exercises, calcium and vitamin D supplementation.  Due for next bone density in September of next year Hypertension with elevated reading in office today.  We will continue home monitoring, start amlodipine 5 mg daily if home readings remain elevated above 140/90.  Patient understands instructions and care plan. Smoker: Counseling done.  Patient not interested in quitting at this time. Monitoring kidney disease regularly.  Acute kidney injury resolved.  Chronic back pain is stable.  Reviewed recent CT scan.  DJD and mild spinal stenosis  Follow up: Return in about 3 months (around 09/14/2021) for follow up Hypertension.  Visit date not found  No orders of the defined types were placed in this encounter.  Meds ordered this encounter  Medications   amLODipine (NORVASC) 5 MG tablet    Sig: Take 1 tablet (5 mg total) by mouth daily.    Dispense:  90 tablet    Refill:  3      I reviewed the patients updated PMH, FH, and SocHx.    Patient Active Problem List   Diagnosis Date Noted   History of heart artery stent 02/10/2020    Priority: High   Simple chronic bronchitis ([place]) 02/11/2018    Priority: High   Hyperlipidemia associated with  type 2 diabetes mellitus ([place]) 06/04/2017    Priority: High   Diabetes mellitus type II, controlled ([place]) 09/10/2016    Priority: High   CAD (coronary artery disease), native coronary artery 09/10/2016    Priority: High   Hypertension associated with diabetes ([place]) 09/10/2016    Priority: High   Smoker 09/10/2016    Priority: High   Depression, major, recurrent, in partial remission ([place])     Priority: High   History of sepsis 05/11/2021    Priority: Medium   Chronic hyponatremia 02/07/2021    Priority: Medium   Bilateral renal cysts 02/06/2021    Priority: Medium    Chronic midline low back pain without sciatica 12/26/2020    Priority: Medium   Serrated polyp of colon 08/12/2018    Priority: Medium   Osteopenia 06/10/2017    Priority: Medium   Encounter for screening for lung cancer 02/10/2020    Priority: Low   Mild intermittent asthma 08/06/2019    Priority: Low   Alcohol use 02/07/2021   Chronic kidney disease, stage 3a ([place]) 02/07/2021   Current Meds  Medication Sig   albuterol (PROAIR HFA) 108 (90 Base) MCG/ACT inhaler Inhale 2 puffs into the lungs every 6 (six) hours as needed for wheezing or shortness of breath.   ALPRAZolam (XANAX) 0.5 MG tablet TAKE ONE TABLET BY MOUTH DAILY AS NEEDED FOR ANXIETY   amLODipine (NORVASC) 5 MG tablet Take 1 tablet (5 mg total) by mouth daily.   aspirin 81 MG tablet Take 81 mg by mouth at bedtime.   atorvastatin (LIPITOR) 20 MG tablet Take 20 mg by mouth daily.   cloNIDine (CATAPRES) 0.1 MG tablet TAKE 1 TABLET BY MOUTH 3  TIMES DAILY   diphenhydrAMINE (BENADRYL) 25 MG tablet Take 25 mg by mouth every 6 (six) hours as needed for allergies or sleep.   fluocinonide (LIDEX) 0.05 % external solution Apply 1 application topically daily.   lisinopril (ZESTRIL) 40 MG tablet Take 1 tablet (40 mg total) by mouth daily.   meloxicam (MOBIC) 15 MG tablet Take 1 tablet (15 mg total) by mouth daily.   metFORMIN (GLUCOPHAGE) 1000 MG tablet TAKE 1 TABLET BY MOUTH  TWICE DAILY WITH MEALS   Multiple Vitamins-Minerals (MULTIVITAMIN WITH MINERALS) tablet Take 1 tablet by mouth daily. Centrum silver for women   venlafaxine XR (EFFEXOR-XR) 75 MG 24 hr capsule TAKE THREE CAPSULES BY MOUTH DAILY WITH BREAKFAST    Allergies: Patient has No Known Allergies. Family History: Patient family history includes AAA (abdominal aortic aneurysm) in her mother; Arthritis in her brother and mother; Dementia in her maternal grandmother; Heart attack in her brother; Hyperlipidemia in her brother; Hypertension in her brother; Hypothyroidism in  her mother; Kidney disease in her mother; Other in her father; Ovarian cancer in her mother; Stroke in her brother. Social History:  Patient  reports that she has been smoking cigarettes. She has a 14.40 pack-year smoking history. She has never used smokeless tobacco. She reports current alcohol use of about 4.0 standard drinks of alcohol per week. She reports that she does not use drugs.  Review of Systems: Constitutional: Negative for fever malaise or anorexia Cardiovascular: negative for chest pain Respiratory: negative for SOB or persistent cough Gastrointestinal: negative for abdominal pain  Objective  Vitals: BP (!) 188/100   Pulse (!) 55   Temp 97.8 F (36.6 C) (Temporal)   Wt 192 lb 9.6 oz (87.4 kg)   LMP  (LMP Unknown)   SpO2 98%  BMI 30.17 kg/m  General: no acute distress , A&Ox3 HEENT: [place]ER[place], conjunctiva normal, neck is supple Cardiovascular:  RRR without murmur or gallop.  Respiratory:  Good breath sounds bilaterally, CTAB with normal respiratory effort Skin:  Warm, no rashes    Commons side effects, risks, benefits, and alternatives for medications and treatment plan prescribed today were discussed, and the patient expressed understanding of the given instructions. Patient is instructed to call or message via MyChart if he/she has any questions or concerns regarding our treatment plan. No barriers to understanding were identified. We discussed Red Flag symptoms and signs in detail. Patient expressed understanding regarding what to do in case of urgent or emergency type symptoms.  Medication list was reconciled, printed and provided to the patient in AVS. Patient instructions and summary information was reviewed with the patient as documented in the AVS. This note was prepared with assistance of Dragon voice recognition software. Occasional wrong-word or sound-a-like substitutions may have occurred due to the inherent limitations of voice recognition software  This visit  occurred during the SARS-CoV-2 public health emergency.  Safety protocols were in place, including screening questions prior to the visit, additional usage of staff PPE, and extensive cleaning of exam room while observing appropriate contact time as indicated for disinfecting solutions.

## 2021-06-16 NOTE — Patient Instructions (Addendum)
Visit Information   Goals Addressed             This Visit's Progress    Track and Manage My Blood Pressure-Hypertension       Timeframe:  Long-Range Goal Priority:  High Start Date:  06/13/21                           Expected End Date: 12/14/21                      Follow Up Date 09/25/21    - check blood pressure 3 times per week - choose a place to take my blood pressure (home, clinic or office, retail store) - write blood pressure results in a log or diary    Why is this important?   You won't feel high blood pressure, but it can still hurt your blood vessels.  High blood pressure can cause heart or kidney problems. It can also cause a stroke.  Making lifestyle changes like losing a little weight or eating less salt will help.  Checking your blood pressure at home and at different times of the day can help to control blood pressure.  If the doctor prescribes medicine remember to take it the way the doctor ordered.  Call the office if you cannot afford the medicine or if there are questions about it.     Notes:        Patient Care Plan: General Pharmacy (Adult)     Problem Identified: HTN, HLD, DM   Priority: High  Onset Date: 06/13/2021     Long-Range Goal: Patient-Specific Goal   Start Date: 06/13/2021  Expected End Date: 12/17/2021  This Visit's Progress: On track  Priority: High  Note:   Current Barriers:  Unable to independently monitor therapeutic efficacy  Pharmacist Clinical Goal(s):  Patient will achieve adherence to monitoring guidelines and medication adherence to achieve therapeutic efficacy adhere to plan to optimize therapeutic regimen for HTN as evidenced by report of adherence to recommended medication management changes contact provider office for questions/concerns as evidenced notation of same in electronic health record through collaboration with PharmD and provider.   Interventions: 1:1 collaboration with Leamon Arnt, MD regarding  development and update of comprehensive plan of care as evidenced by provider attestation and co-signature Inter-disciplinary care team collaboration (see longitudinal plan of care) Comprehensive medication review performed; medication list updated in electronic medical record  Hypertension (BP goal <140/90) -Controlled -Current treatment: Lisinopril '40mg'$  daily Clonidine 0.'1mg'$  daily -Medications previously tried: none noted  -Current home readings: no logs, office BP normal recently  -Denies hypotensive/hypertensive symptoms -Educated on BP goals and benefits of medications for prevention of heart attack, stroke and kidney damage; Daily salt intake goal < 2300 mg; Exercise goal of 150 minutes per week; Importance of home blood pressure monitoring; Symptoms of hypotension and importance of maintaining adequate hydration; -Counseled to monitor BP at home a few times per week at least, document, and provide log at future appointments -Patient mentions some recent falls due to accidental falls, no dizziness. -Recommended to continue current medication Recommended increased home monitoring, could consider addition of amlodipine if BP continues to be elevated  Osteoporosis / Osteopenia (Goal Maintain bone density) -Controlled -Last DEXA Scan: 08/25/20   T-Score femoral neck: -1.5   -Patient is not a candidate for pharmacologic treatment -Current treatment  None currently -Medications previously tried: none noted  -Recommend [phone] units of vitamin  D daily. Recommend 1200 mg of calcium daily from dietary and supplemental sources. Recommend weight-bearing and muscle strengthening exercises for building and maintaining bone density. -Recommended repeat DEXA 2 years from previous  Diabetes (A1c goal <7%) -Controlled -Current medications: Metformin '1000mg'$  BID with meals -Medications previously tried: none noted  -Current home glucose readings fasting glucose: no logs post prandial  glucose: no logs -Denies hypoglycemic/hyperglycemic symptoms  -Educated on A1c and blood sugar goals; Complications of diabetes including kidney damage, retinal damage, and cardiovascular disease; Exercise goal of 150 minutes per week; Prevention and management of hypoglycemic episodes; -Counseled to check feet daily and get yearly eye exams -Counseled on diet and exercise extensively Recommended to continue current medication  Patient Goals/Self-Care Activities Patient will:  - take medications as prescribed focus on medication adherence by pill box check glucose daily, document, and provide at future appointments check blood pressure daily, document, and provide at future appointments  Follow Up Plan: The care management team will reach out to the patient again over the next 120 days.       Patient verbalizes understanding of instructions provided today and agrees to view in [place].  Telephone follow up appointment with pharmacy team member scheduled for: 4 months  Edythe Clarity, [place]

## 2021-06-23 ENCOUNTER — Telehealth: Payer: Self-pay | Admitting: Pharmacist

## 2021-06-23 NOTE — Chronic Care Management (AMB) (Signed)
    Chronic Care Management Pharmacy Assistant   Name: ALIANNA HYPPOLITE  MRN: [id number] DOB: 12-01-1950  Reason for Encounter: Chart Review    Recent office visits:  06/14/2021 [facility] (PCP) Billey Chang, MD; chronic follow up, if your blood pressure is consistently higher than 140/90, then start the new blood pressure medication: amlodipine 5 mg daily. Continue Clonodine 3x/day and your lisinopril every morning.  Recent consult visits:  None  Hospital visits:  None in previous 6 months  Medications: Outpatient Encounter Medications as of 06/23/2021  Medication Sig Note   albuterol (PROAIR HFA) 108 (90 Base) MCG/ACT inhaler Inhale 2 puffs into the lungs every 6 (six) hours as needed for wheezing or shortness of breath. 05/10/2021: Needs refill   ALPRAZolam (XANAX) 0.5 MG tablet TAKE ONE TABLET BY MOUTH DAILY AS NEEDED FOR ANXIETY    amLODipine (NORVASC) 5 MG tablet Take 1 tablet (5 mg total) by mouth daily.    aspirin 81 MG tablet Take 81 mg by mouth at bedtime.    atorvastatin (LIPITOR) 20 MG tablet Take 20 mg by mouth daily.    cloNIDine (CATAPRES) 0.1 MG tablet TAKE 1 TABLET BY MOUTH 3  TIMES DAILY    diphenhydrAMINE (BENADRYL) 25 MG tablet Take 25 mg by mouth every 6 (six) hours as needed for allergies or sleep.    fluocinonide (LIDEX) 0.05 % external solution Apply 1 application topically daily.    lisinopril (ZESTRIL) 40 MG tablet Take 1 tablet (40 mg total) by mouth daily.    meloxicam (MOBIC) 15 MG tablet Take 1 tablet (15 mg total) by mouth daily.    metFORMIN (GLUCOPHAGE) 1000 MG tablet TAKE 1 TABLET BY MOUTH  TWICE DAILY WITH MEALS    Multiple Vitamins-Minerals (MULTIVITAMIN WITH MINERALS) tablet Take 1 tablet by mouth daily. Centrum silver for women    nitroGLYCERIN (NITROSTAT) 0.4 MG SL tablet Place 1 tablet (0.4 mg total) under the tongue every 5 (five) minutes as needed for chest pain. 05/10/2021: Needs refill   venlafaxine XR (EFFEXOR-XR) 75 MG 24 hr capsule TAKE THREE  CAPSULES BY MOUTH DAILY WITH BREAKFAST    No facility-administered encounter medications on file as of 06/23/2021.    Reviewed chart for medication changes.  06/14/2021 [facility] (PCP) Billey Chang, MD; chronic follow up, if your blood pressure is consistently higher than 140/90, then start the new blood pressure medication: amlodipine 5 mg daily. Continue Clonodine 3x/day and your lisinopril every morning.  Patient has a scheduled follow up with the clinical pharmacist.  Future Appointments  Date Time Provider [place]  10/18/2021  3:00 PM [facility] CCM PHARMACIST [facility]  06/01/2022  8:45 AM [facility] HEALTH COACH [facility]     April D Calhoun, [place] Pharmacist Assistant [phone]

## 2021-07-03 ENCOUNTER — Telehealth: Payer: Self-pay

## 2021-07-03 ENCOUNTER — Other Ambulatory Visit: Payer: Self-pay

## 2021-07-03 MED ORDER — LISINOPRIL 40 MG PO TABS: 40.0000 mg | ORAL_TABLET | Freq: Every day | ORAL | 3 refills | Status: DC

## 2021-07-03 MED ORDER — VENLAFAXINE HCL ER 75 MG PO CP24: ORAL_CAPSULE | ORAL | 3 refills | Status: DC

## 2021-07-03 NOTE — Telephone Encounter (Signed)
Received refill request for POTASSIUM CL SR [facility] TB  Medication not on current med list. Please advise if refill is appropriate

## 2021-07-07 DIAGNOSIS — S83281A Other tear of lateral meniscus, current injury, right knee, initial encounter: Secondary | ICD-10-CM | POA: Diagnosis not present

## 2021-07-10 ENCOUNTER — Other Ambulatory Visit: Payer: Self-pay

## 2021-07-10 MED ORDER — VENLAFAXINE HCL ER 75 MG PO CP24
ORAL_CAPSULE | ORAL | 0 refills | Status: DC
Start: 2021-07-10 — End: 2022-06-21

## 2021-07-17 ENCOUNTER — Telehealth: Payer: Self-pay | Admitting: Pharmacist

## 2021-07-17 DIAGNOSIS — M25561 Pain in right knee: Secondary | ICD-10-CM | POA: Diagnosis not present

## 2021-07-17 NOTE — Chronic Care Management (AMB) (Addendum)
Chronic Care Management Pharmacy Assistant   Name: Jessica Curry  MRN: [id number] DOB: 07-09-1951  Reason for Encounter: Hypertension Adherence Call    Recent office visits:  None  Recent consult visits:  None  Hospital visits:  None in previous 6 months  Medications: Outpatient Encounter Medications as of 07/17/2021  Medication Sig Note   albuterol (PROAIR HFA) 108 (90 Base) MCG/ACT inhaler Inhale 2 puffs into the lungs every 6 (six) hours as needed for wheezing or shortness of breath. 05/10/2021: Needs refill   ALPRAZolam (XANAX) 0.5 MG tablet TAKE ONE TABLET BY MOUTH DAILY AS NEEDED FOR ANXIETY    amLODipine (NORVASC) 5 MG tablet Take 1 tablet (5 mg total) by mouth daily.    aspirin 81 MG tablet Take 81 mg by mouth at bedtime.    atorvastatin (LIPITOR) 20 MG tablet Take 20 mg by mouth daily.    cloNIDine (CATAPRES) 0.1 MG tablet TAKE 1 TABLET BY MOUTH 3  TIMES DAILY    diphenhydrAMINE (BENADRYL) 25 MG tablet Take 25 mg by mouth every 6 (six) hours as needed for allergies or sleep.    fluocinonide (LIDEX) 0.05 % external solution Apply 1 application topically daily.    lisinopril (ZESTRIL) 40 MG tablet Take 1 tablet (40 mg total) by mouth daily.    meloxicam (MOBIC) 15 MG tablet Take 1 tablet (15 mg total) by mouth daily.    metFORMIN (GLUCOPHAGE) 1000 MG tablet TAKE 1 TABLET BY MOUTH  TWICE DAILY WITH MEALS    Multiple Vitamins-Minerals (MULTIVITAMIN WITH MINERALS) tablet Take 1 tablet by mouth daily. Centrum silver for women    nitroGLYCERIN (NITROSTAT) 0.4 MG SL tablet Place 1 tablet (0.4 mg total) under the tongue every 5 (five) minutes as needed for chest pain. 05/10/2021: Needs refill   venlafaxine XR (EFFEXOR-XR) 75 MG 24 hr capsule TAKE THREE CAPSULES BY MOUTH DAILY WITH BREAKFAST    No facility-administered encounter medications on file as of 07/17/2021.   Reviewed chart prior to disease state call. Spoke with patient regarding BP  Recent Office Vitals: BP  Readings from Last 3 Encounters:  06/14/21 (!) 188/100  05/26/21 130/78  05/26/21 130/78   Pulse Readings from Last 3 Encounters:  06/14/21 (!) 55  05/26/21 (!) 54  05/26/21 (!) 54    Wt Readings from Last 3 Encounters:  06/14/21 192 lb 9.6 oz (87.4 kg)  05/26/21 189 lb (85.7 kg)  05/26/21 189 lb (85.7 kg)     Kidney Function Lab Results  Component Value Date/Time   CREATININE 1.00 05/26/2021 10:10 AM   CREATININE 1.09 (H) 05/12/2021 05:10 AM   CREATININE 1.15 (H) 02/01/2021 03:35 PM   CREATININE 1.00 (H) 09/15/2020 10:25 AM   GFR 57.33 (L) 05/26/2021 10:10 AM   GFRNONAA 55 (L) 05/12/2021 05:10 AM   GFRNONAA 49 (L) 02/01/2021 03:35 PM   GFRAA 56 (L) 02/01/2021 03:35 PM    BMP Latest Ref Rng & Units 05/26/2021 05/12/2021 05/11/2021  Glucose 70 - 99 mg/dL 99 101(H) 122(H)  BUN 6 - 23 mg/dL '15 12 19  '$ Creatinine 0.40 - 1.20 mg/dL 1.00 1.09(H) 1.24(H)  BUN/Creat Ratio 6 - 22 (calc) - - -  Sodium 135 - 145 mEq/L 132(L) 129(L) 131(L)  Potassium 3.5 - 5.1 mEq/L 4.9 3.0(L) 3.6  Chloride 96 - 112 mEq/L 99 95(L) 95(L)  CO2 19 - 32 mEq/L '27 25 24  '$ Calcium 8.4 - 10.5 mg/dL 10.1 9.1 9.8    Current antihypertensive regimen:  Lisinopril 40  mg daily Clonidine 0.1 mg three times daily  How often are you checking your Blood Pressure? Patient states she is not currently monitoring her blood pressure. Patient states her brother recently passed away. She states he had a massive stroke. Patient states she doesn't feel like her blood pressure has went up.  Current home BP readings: none  What recent interventions/DTPs have been made by any provider to improve Blood Pressure control since last CPP Visit: No interventions or DTPs.  Any recent hospitalizations or ED visits since last visit with CPP? No  What diet changes have been made to improve Blood Pressure Control?  None, patient states she hasn't made any diet changes recently.  What exercise is being done to improve your Blood  Pressure Control?  Patient states she is not exercising at this time.  Adherence Review: Is the patient currently on ACE/ARB medication? Yes Does the patient have >5 day gap between last estimated fill dates? No  Patient states she has been running out of her medications recently. She states her pharmacy will mail her medications to her home, but she sometimes runs out of the medication before it arrives. Patient states she is currently waiting on Metformin to come in the mail.  Future Appointments  Date Time Provider [place]  10/18/2021  3:00 PM [facility] CCM PHARMACIST [facility]  06/01/2022  8:45 AM [facility] HEALTH COACH [facility]     Star Rating Drugs Atorvastatin 20 mg last filled 06/15/2021 90 DS Lisinopril/HCTZ 20-25 mg last filled 12/04/2020 90 DS Metformin 1000 mg last filled 03/13/2021 90 DS  April D Calhoun, [place] Pharmacist Assistant [phone] 10 minutes spent in review, coordination, and documentation.  Reviewed by: Beverly Milch, PharmD Clinical Pharmacist [phone]

## 2021-07-19 ENCOUNTER — Telehealth: Payer: Self-pay

## 2021-07-19 DIAGNOSIS — M25561 Pain in right knee: Secondary | ICD-10-CM | POA: Diagnosis not present

## 2021-07-19 NOTE — Telephone Encounter (Signed)
Pt called in stating that she has been having high blood pressure for the past month. She stated that on 8/23 her blood pressure was 200/100. I sent pt to triage. Pt then called back to schedule with Dr Jonni Sanger. I told pt that Dr Jonni Sanger is out of the office but she can be scheduled with someone else. Pt denied and only wants to see Dr Jonni Sanger. Pt stated that she will call when Dr Jonni Sanger gets back. Should pt wait to be seen? Please Advise.

## 2021-07-20 ENCOUNTER — Telehealth: Payer: Self-pay

## 2021-07-20 NOTE — Telephone Encounter (Signed)
[facility] [place] RECORD AccessNurse Patient Name: Jessica Curry Gender: Female DOB: November 10, 1951 Age: 70 Y 14 D Return Phone Number: [phone] (Primary) Address: City/ State/ Zip: [place]  [address] Client [place] [place] Client Site [place] [facility] [place] Day Physician Billey Chang- MD Contact Type Call Who Is Calling Patient / Member / Family / Caregiver Call Type Triage / Clinical Relationship To Patient Self Return Phone Number [phone] (Primary) Chief Complaint BLOOD PRESSURE HIGH - Systolic (top number) [phone] or greater Reason for Call Symptomatic / Request for Health Information Initial Comment The caller reports that she is experiencing high blood pressure (220/100 per last reading). The caller reports that she is on her lisinopril and clonidine medications. Translation No Nurse Assessment Nurse: Laqueta Due, RN, Metallurgist (Eastern Time): 07/19/2021 4:43:00 PM Confirm and document reason for call. If symptomatic, describe symptoms. ---pt having high blood pressure 220/100 yesterday, doesn't have a BP machine today. taking lisinopril and clonidine. denies sob/chest pain/dizziness/blurry vision/unsteady walking. Does the patient have any new or worsening symptoms? ---No Please document clinical information provided and list any resource used. ---Pt had BP of 220/100 yesterday, unable to check BP today and denies any symptoms (nurse went through many symptoms associated with high blood pressure and pt denied). advised to call office back for appointment. no access to EPIC right now for appointments Disp. Time Eilene Ghazi Time) Disposition Final User 07/19/2021 4:35:29 PM Send to Urgent Queue Milton Ferguson 07/19/2021 4:49:25 PM Clinical Call Yes Laqueta Due, RN, [place]

## 2021-07-20 NOTE — Telephone Encounter (Signed)
Please tell patient that I advised she needs to be seen by another provider in the office. We are unsure of when Dr. Jonni Sanger will return.

## 2021-07-20 NOTE — Telephone Encounter (Signed)
Pt is scheduled with Alyssa.

## 2021-07-21 ENCOUNTER — Ambulatory Visit (INDEPENDENT_AMBULATORY_CARE_PROVIDER_SITE_OTHER): Payer: Medicare Other | Admitting: Physician Assistant

## 2021-07-21 ENCOUNTER — Encounter: Payer: Self-pay | Admitting: Physician Assistant

## 2021-07-21 ENCOUNTER — Other Ambulatory Visit: Payer: Self-pay

## 2021-07-21 VITALS — BP 196/98 | HR 57 | Temp 98.7°F | Ht 67.0 in | Wt 187.6 lb

## 2021-07-21 DIAGNOSIS — F4321 Adjustment disorder with depressed mood: Secondary | ICD-10-CM | POA: Diagnosis not present

## 2021-07-21 DIAGNOSIS — I152 Hypertension secondary to endocrine disorders: Secondary | ICD-10-CM

## 2021-07-21 DIAGNOSIS — E1159 Type 2 diabetes mellitus with other circulatory complications: Secondary | ICD-10-CM | POA: Diagnosis not present

## 2021-07-21 MED ORDER — AMLODIPINE BESYLATE 5 MG PO TABS: 5.0000 mg | ORAL_TABLET | Freq: Every day | ORAL | 0 refills | Status: DC

## 2021-07-21 NOTE — Progress Notes (Signed)
Acute Office Visit  Subjective:    Patient ID: Jessica Curry, female    DOB: Apr 22, 1951, 70 y.o.   MRN: [id number]  Chief Complaint  Patient presents with   Hypertension    Pt states that her brother passed away 2 weeks ago. She is living with her 21 year old Mother who is grieving. And reports things have been stressful. She has taken Lisinopril this morning along with (2) Clonidine.     HPI Patient is in today for recheck of high blood pressure readings. She took Lisinopril 40 mg & clonidine 0.1 mg (two tablets so far). She says she has not been taking Amlodipine 5 mg because she didn't realize she was prescribed this.  She has not been checking her blood pressure at home.   She denies any chest pain, shortness of breath, headaches, blurred vision, weakness, or other symptoms.  BP Readings from Last 3 Encounters:  07/21/21 (!) 196/98  06/14/21 (!) 188/100  05/26/21 130/78     Past Medical History:  Diagnosis Date   Allergy    Asthma    Chicken pox    Depression    reasonable control on venlafaxine '75mg'$  TID. xanax #14 lasts about 2 years for anxiety portion.    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 [employer]   Heart attack Brother    Arthritis Brother        hip replacement   Hyperlipidemia Brother    Hypertension Brother    Dementia Maternal Grandmother    Colon cancer Neg Hx    Esophageal cancer  Neg Hx    Rectal cancer Neg Hx    Stomach cancer Neg Hx     Social History   Socioeconomic History   Marital status: Divorced    Spouse name: Not on file   Number of children: Not on file   Years of education: Not on file   Highest education level: Not on file  Occupational History   Not on file  Tobacco Use   Smoking status: Every Day    Packs/day: 0.30    Years: 48.00    Pack years: 14.40    Types: Cigarettes   Smokeless tobacco: Never   Tobacco comments:    handouts provided   Vaping Use   Vaping Use: Never used  Substance and Sexual Activity   Alcohol use: Yes    Alcohol/week: 4.0 standard drinks    Types: 4 Cans of beer per week   Drug use: No   Sexual activity: Yes  Other Topics Concern   Not on file  Social History Narrative   Divorced. Did not ask about children.       Lives with mother  BS undergrad.    Now struggles to find work and has financial struggles- getting on medicare has helped.    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 Does not "mow or blow"   Social Determinants of Health   Financial Resource Strain: Low Risk    Difficulty of Paying Living Expenses: Not hard at all  Food Insecurity: No Food Insecurity   Worried About Charity fundraiser in the Last Year: Never true   [place] in the Last Year: Never true  Transportation Needs: No Transportation Needs   Lack of Transportation (Medical): No   Lack of Transportation (Non-Medical): No  Physical Activity: Inactive   Days of Exercise per Week: 0 days   Minutes of Exercise per Session: 0 min  Stress: Stress Concern Present   Feeling of Stress : To some extent  Social Connections: Socially Isolated   Frequency of Communication with Friends and Family: Twice a week   Frequency of Social Gatherings with Friends and Family: Never   Attends Religious Services: 1 to 4 times per year   Active Member of  [employer] or Organizations: No   Attends Music therapist: Never   Marital Status: Divorced  Human resources officer Violence: Not At Risk   Fear of Current or Ex-Partner: No   Emotionally Abused: No   Physically Abused: No   Sexually Abused: No    Outpatient Medications Prior to Visit  Medication Sig Dispense Refill   albuterol (PROAIR HFA) 108 (90 Base) MCG/ACT inhaler Inhale 2 puffs into the lungs every 6 (six) hours as needed for wheezing or shortness of breath. 3 Inhaler 1   ALPRAZolam (XANAX) 0.5 MG tablet TAKE ONE TABLET BY MOUTH DAILY AS NEEDED FOR ANXIETY 30 tablet 0   aspirin 81 MG tablet Take 81 mg by mouth at bedtime.     atorvastatin (LIPITOR) 20 MG tablet Take 20 mg by mouth daily.     cloNIDine (CATAPRES) 0.1 MG tablet TAKE 1 TABLET BY MOUTH 3  TIMES DAILY 270 tablet 3   diphenhydrAMINE (BENADRYL) 25 MG tablet Take 25 mg by mouth every 6 (six) hours as needed for allergies or sleep.     fluocinonide (LIDEX) 0.05 % external solution Apply 1 application topically daily.     lisinopril (ZESTRIL) 40 MG tablet Take 1 tablet (40 mg total) by mouth daily. 90 tablet 3   meloxicam (MOBIC) 15 MG tablet Take 1 tablet (15 mg total) by mouth daily. 90 tablet 3   metFORMIN (GLUCOPHAGE) 1000 MG tablet TAKE 1 TABLET BY MOUTH  TWICE DAILY WITH MEALS 180 tablet 3   Multiple Vitamins-Minerals (MULTIVITAMIN WITH MINERALS) tablet Take 1 tablet by mouth daily. Centrum silver for women     venlafaxine XR (EFFEXOR-XR) 75 MG 24 hr capsule TAKE THREE CAPSULES BY MOUTH DAILY WITH BREAKFAST 30 capsule 0   amLODipine (NORVASC) 5 MG tablet Take 1 tablet (5 mg total) by mouth daily. 90 tablet 3   nitroGLYCERIN (NITROSTAT) 0.4 MG SL tablet Place 1 tablet (0.4 mg total) under the tongue every 5 (five) minutes as needed for chest pain. 25 tablet 3   No facility-administered medications prior to visit.    No Known Allergies  Review of Systems REFER TO HPI FOR PERTINENT POSITIVES AND NEGATIVES      Objective:    Physical Exam Vitals and nursing note reviewed.  Constitutional:  Appearance: Normal appearance. She is normal weight. She is not toxic-appearing.  HENT:     Head: Normocephalic and atraumatic.  Eyes:     Extraocular Movements: Extraocular movements intact.     Conjunctiva/sclera: Conjunctivae normal.     Pupils: Pupils are equal, round, and reactive to light.  Cardiovascular:     Rate and Rhythm: Normal rate and regular rhythm.     Pulses: Normal pulses.     Heart sounds: Normal heart sounds.  Pulmonary:     Effort: Pulmonary effort is normal.     Breath sounds: Normal breath sounds.  Musculoskeletal:        General: Normal range of motion.     Cervical back: Normal range of motion and neck supple.  Skin:    General: Skin is warm and dry.  Neurological:     General: No focal deficit present.     Mental Status: She is alert and oriented to person, place, and time.  Psychiatric:        Mood and Affect: Mood normal.        Behavior: Behavior normal.        Thought Content: Thought content normal.        Judgment: Judgment normal.    BP (!) 196/98   Pulse (!) 57   Temp 98.7 F (37.1 C) (Temporal)   Ht '5\' 7"'$  (1.702 m)   Wt 187 lb 9.6 oz (85.1 kg)   LMP  (LMP Unknown)   SpO2 97%   BMI 29.38 kg/m  Wt Readings from Last 3 Encounters:  07/21/21 187 lb 9.6 oz (85.1 kg)  06/14/21 192 lb 9.6 oz (87.4 kg)  05/26/21 189 lb (85.7 kg)    Health Maintenance Due  Topic Date Due   INFLUENZA VACCINE  06/26/2021    There are no preventive care reminders to display for this patient.   Lab Results  Component Value Date   TSH 1.971 05/10/2021   Lab Results  Component Value Date   WBC 5.0 05/26/2021   HGB 12.7 05/26/2021   HCT 37.1 05/26/2021   MCV 90.8 05/26/2021   PLT 359.0 05/26/2021   Lab Results  Component Value Date   NA 132 (L) 05/26/2021   K 4.9 05/26/2021   CO2 27 05/26/2021   GLUCOSE 99 05/26/2021   BUN 15 05/26/2021   CREATININE 1.00  05/26/2021   BILITOT 0.4 05/11/2021   ALKPHOS 118 05/11/2021   AST 52 (H) 05/11/2021   ALT 42 05/11/2021   PROT 7.2 05/11/2021   ALBUMIN 3.6 05/11/2021   CALCIUM 10.1 05/26/2021   ANIONGAP 9 05/12/2021   GFR 57.33 (L) 05/26/2021   Lab Results  Component Value Date   CHOL 158 12/26/2020   Lab Results  Component Value Date   HDL 43.10 12/26/2020   Lab Results  Component Value Date   LDLCALC 78 09/15/2020   Lab Results  Component Value Date   TRIG 226.0 (H) 12/26/2020   Lab Results  Component Value Date   CHOLHDL 4 12/26/2020   Lab Results  Component Value Date   HGBA1C 6.6 (H) 05/10/2021       Assessment & Plan:   Problem List Items Addressed This Visit       Cardiovascular and Mediastinum   Hypertension associated with diabetes ([place]) - Primary   Relevant Medications   amLODipine (NORVASC) 5 MG tablet   Other Visit Diagnoses     Grieving  Meds ordered this encounter  Medications   DISCONTD: amLODipine (NORVASC) 5 MG tablet    Sig: Take 1 tablet (5 mg total) by mouth daily.    Dispense:  30 tablet    Refill:  0   amLODipine (NORVASC) 5 MG tablet    Sig: Take 1 tablet (5 mg total) by mouth daily.    Dispense:  30 tablet    Refill:  0   1. Hypertension associated with diabetes ([place]) 2. Grieving Patient is currently grieving the loss of her brother who suffered a massive stroke.  I am sure this is contributing to her elevated reading.  However, we discussed today that she herself is in risk of stroke or other major CVD event if we cannot get her blood pressure down.  She did not realize that she was supposed to be taking amlodipine 5 mg.  I sent this prescription to her pharmacy again for her and she is going to start on her first dose today.  She is going to continue lisinopril 40 mg once daily and clonidine 0.1 mg 3 times daily.  I encouraged her to monitor her blood pressure at home 1-2 times daily and bring this to her next appointment next  week.  Very low threshold for her to go to the emergency department if she gets any sudden chest pain, shortness of breath, headache, weakness on one side, change in vision, or other acute symptoms with her elevated blood pressure readings.  I am happy to see her until her PCP returns from leave and work with her on trying to get this better.   Maria Coin M Ashyla Luth, PA-C

## 2021-07-21 NOTE — Patient Instructions (Addendum)
Very low threshold to go to the ED if any chest pain, shortness of breath, severe headache, weakness on one side, change in vision, or other acute symptoms develop.  Please monitor your blood pressure closely at home one to two times daily and bring log to next appointment.  Continue the lisinopril 40 mg (once daily) and clonidine 0.1 mg (three times daily). START on Amlodipine 5 mg daily.  Low salt diet, plenty of water, walking, quitting smoking, and cutting back on alcohol use all encouraged as well.

## 2021-07-27 ENCOUNTER — Other Ambulatory Visit: Payer: Self-pay | Admitting: Family Medicine

## 2021-07-27 DIAGNOSIS — N281 Cyst of kidney, acquired: Secondary | ICD-10-CM

## 2021-07-28 ENCOUNTER — Encounter: Payer: Self-pay | Admitting: Physician Assistant

## 2021-07-28 ENCOUNTER — Other Ambulatory Visit: Payer: Self-pay

## 2021-07-28 ENCOUNTER — Ambulatory Visit (INDEPENDENT_AMBULATORY_CARE_PROVIDER_SITE_OTHER): Payer: Medicare Other | Admitting: Physician Assistant

## 2021-07-28 VITALS — BP 162/84 | HR 56 | Temp 98.6°F | Ht 67.0 in | Wt 180.2 lb

## 2021-07-28 DIAGNOSIS — I152 Hypertension secondary to endocrine disorders: Secondary | ICD-10-CM | POA: Diagnosis not present

## 2021-07-28 DIAGNOSIS — F4321 Adjustment disorder with depressed mood: Secondary | ICD-10-CM | POA: Diagnosis not present

## 2021-07-28 DIAGNOSIS — E1159 Type 2 diabetes mellitus with other circulatory complications: Secondary | ICD-10-CM | POA: Diagnosis not present

## 2021-07-28 NOTE — Patient Instructions (Signed)
Continue current medication regimen Monitor BP at home, bring log to next appt Low salt diet, plenty of water

## 2021-07-28 NOTE — Progress Notes (Signed)
Established Patient Office Visit  Subjective:  Patient ID: Jessica Curry, female    DOB: 1951/11/10  Age: 70 y.o. MRN: [id number]  CC:  Chief Complaint  Patient presents with   Follow-up    HPI Jessica Curry presents for BP recheck. She does not have any readings from home with her today.  She has been taking the amlodipine 5 mg, as well as continuing her lisinopril 40 mg once daily and clonidine 0.1 mg 3 times daily.  She denies any symptoms.  States she has been busy cleaning out her deceased brother's apartment. Feels like the initial stress of his passing is starting to level out. She is still dealing with her mother's strong emotions regarding the situation though. Currently attending [facility] grief counseling.   Past Medical History:  Diagnosis Date   Allergy    Asthma    Chicken pox    Depression    reasonable control on venlafaxine '75mg'$  TID. xanax #14 lasts about 2 years for anxiety portion.    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 [employer]   Heart attack Brother    Arthritis Brother        hip replacement   Hyperlipidemia Brother    Hypertension Brother    Dementia Maternal Grandmother    Colon cancer Neg Hx    Esophageal cancer Neg Hx    Rectal cancer Neg Hx    Stomach cancer Neg Hx     Social History   Socioeconomic History   Marital status: Divorced    Spouse name: Not  on file   Number of children: Not on file   Years of education: Not on file   Highest education level: Not on file  Occupational History   Not on file  Tobacco Use   Smoking status: Every Day    Packs/day: 0.30    Years: 48.00    Pack years: 14.40    Types: Cigarettes   Smokeless tobacco: Never   Tobacco comments:    handouts provided   Vaping Use   Vaping Use: Never used  Substance and Sexual Activity   Alcohol use: Yes    Alcohol/week: 4.0 standard drinks    Types: 4 Cans of beer per week   Drug use: No   Sexual activity: Yes  Other Topics Concern   Not on file  Social History Narrative   Divorced. Did not ask about children.       Lives with mother   BS undergrad.    Now struggles to find work and has financial struggles- getting on medicare has helped.    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 Does not "mow or blow"   Social Determinants of Health   Financial Resource Strain: Low Risk    Difficulty of Paying Living Expenses: Not hard at all  Food Insecurity: No Food Insecurity   Worried About Charity fundraiser in the Last Year: Never true   [place] in the Last Year: Never true  Transportation Needs: No Transportation Needs   Lack of Transportation (Medical): No   Lack of Transportation (Non-Medical): No  Physical Activity: Inactive   Days of Exercise per Week: 0 days   Minutes of Exercise per Session: 0 min  Stress: Stress Concern Present   Feeling of Stress : To some extent  Social Connections: Socially Isolated   Frequency of Communication with Friends and Family: Twice a week   Frequency of Social Gatherings with Friends and Family: Never   Attends Religious Services: 1 to 4 times per year   Active Member of [employer] or Organizations: No   Attends Music therapist: Never   Marital Status: Divorced  Human resources officer Violence: Not At Risk   Fear of  Current or Ex-Partner: No   Emotionally Abused: No   Physically Abused: No   Sexually Abused: No    Outpatient Medications Prior to Visit  Medication Sig Dispense Refill   albuterol (PROAIR HFA) 108 (90 Base) MCG/ACT inhaler Inhale 2 puffs into the lungs every 6 (six) hours as needed for wheezing or shortness of breath. 3 Inhaler 1   ALPRAZolam (XANAX) 0.5 MG tablet TAKE ONE TABLET BY MOUTH DAILY AS NEEDED FOR ANXIETY 30 tablet 0   amLODipine (NORVASC) 5 MG tablet Take 1 tablet (5 mg total) by mouth daily. 30 tablet 0   aspirin 81 MG tablet Take 81 mg by mouth at bedtime.     atorvastatin (LIPITOR) 20 MG tablet Take 20 mg by mouth daily.     cloNIDine (CATAPRES) 0.1 MG tablet TAKE 1 TABLET BY MOUTH 3  TIMES DAILY 270 tablet 3   diphenhydrAMINE (BENADRYL) 25 MG tablet Take 25 mg by mouth every 6 (six) hours as needed for allergies or sleep.     fluocinonide (LIDEX) 0.05 % external solution Apply 1 application topically daily.     lisinopril (ZESTRIL) 40 MG tablet Take 1 tablet (40 mg total) by mouth daily. 90 tablet 3   meloxicam (MOBIC) 15 MG tablet Take 1 tablet (15 mg total) by mouth daily. 90 tablet 3   metFORMIN (GLUCOPHAGE) 1000 MG tablet TAKE 1 TABLET BY MOUTH  TWICE DAILY WITH MEALS 180 tablet 3   Multiple Vitamins-Minerals (MULTIVITAMIN WITH MINERALS) tablet Take 1 tablet by mouth daily. Centrum silver for women     venlafaxine XR (EFFEXOR-XR) 75 MG 24 hr capsule TAKE THREE CAPSULES BY MOUTH DAILY WITH BREAKFAST 30 capsule 0   nitroGLYCERIN (NITROSTAT) 0.4 MG SL tablet Place 1 tablet (0.4 mg total) under the tongue every 5 (five) minutes as needed for chest pain. 25 tablet 3   No facility-administered medications prior to visit.    No Known Allergies  ROS Review of Systems REFER TO HPI FOR PERTINENT POSITIVES AND NEGATIVES    Objective:    Physical Exam Vitals and nursing note reviewed.  Constitutional:      Appearance: Normal appearance. She is normal weight. She is not  toxic-appearing.  HENT:     Head: Normocephalic and atraumatic.  Eyes:     Extraocular Movements: Extraocular movements intact.  Conjunctiva/sclera: Conjunctivae normal.     Pupils: Pupils are equal, round, and reactive to light.  Cardiovascular:     Rate and Rhythm: Normal rate and regular rhythm.     Pulses: Normal pulses.     Heart sounds: Normal heart sounds.  Pulmonary:     Effort: Pulmonary effort is normal.     Breath sounds: Normal breath sounds.  Musculoskeletal:        General: Normal range of motion.     Cervical back: Normal range of motion and neck supple.  Skin:    General: Skin is warm and dry.  Neurological:     General: No focal deficit present.     Mental Status: She is alert and oriented to person, place, and time.  Psychiatric:        Mood and Affect: Mood normal.        Behavior: Behavior normal.        Thought Content: Thought content normal.        Judgment: Judgment normal.    BP (!) 162/84   Pulse (!) 56   Temp 98.6 F (37 C)   Ht '5\' 7"'$  (1.702 m)   Wt 180 lb 3.2 oz (81.7 kg)   LMP  (LMP Unknown)   SpO2 97%   BMI 28.22 kg/m  Wt Readings from Last 3 Encounters:  07/28/21 180 lb 3.2 oz (81.7 kg)  07/21/21 187 lb 9.6 oz (85.1 kg)  06/14/21 192 lb 9.6 oz (87.4 kg)   BP Readings from Last 3 Encounters:  07/28/21 (!) 162/84  07/21/21 (!) 196/98  06/14/21 (!) 188/100      Health Maintenance Due  Topic Date Due   INFLUENZA VACCINE  06/26/2021    There are no preventive care reminders to display for this patient.  Lab Results  Component Value Date   TSH 1.971 05/10/2021   Lab Results  Component Value Date   WBC 5.0 05/26/2021   HGB 12.7 05/26/2021   HCT 37.1 05/26/2021   MCV 90.8 05/26/2021   PLT 359.0 05/26/2021   Lab Results  Component Value Date   NA 132 (L) 05/26/2021   K 4.9 05/26/2021   CO2 27 05/26/2021   GLUCOSE 99 05/26/2021   BUN 15 05/26/2021   CREATININE 1.00 05/26/2021   BILITOT 0.4 05/11/2021   ALKPHOS 118  05/11/2021   AST 52 (H) 05/11/2021   ALT 42 05/11/2021   PROT 7.2 05/11/2021   ALBUMIN 3.6 05/11/2021   CALCIUM 10.1 05/26/2021   ANIONGAP 9 05/12/2021   GFR 57.33 (L) 05/26/2021   Lab Results  Component Value Date   CHOL 158 12/26/2020   Lab Results  Component Value Date   HDL 43.10 12/26/2020   Lab Results  Component Value Date   LDLCALC 78 09/15/2020   Lab Results  Component Value Date   TRIG 226.0 (H) 12/26/2020   Lab Results  Component Value Date   CHOLHDL 4 12/26/2020   Lab Results  Component Value Date   HGBA1C 6.6 (H) 05/10/2021      Assessment & Plan:   Problem List Items Addressed This Visit       Cardiovascular and Mediastinum   Hypertension associated with diabetes ([place]) - Primary   Other Visit Diagnoses     Grieving          1. Hypertension associated with diabetes ([place]) 2. Grieving Her blood pressure is greatly improved today and I recommended we continue to give the medicine more time  to work.  Her stress levels are also improving since the passing of her brother.  She will continue the amlodipine 5 mg, lisinopril 40 mg, and clonidine 0.1 mg 3 times daily.  I again encouraged her to try to monitor her readings at home and bring the log with her to the next appointment.  She should call sooner if she has any concerns.  Follow-up: Return in about 4 weeks (around 08/25/2021) for BP rechec with Dr. Jonni Sanger.    Iysis Germain M Adalynd Donahoe, PA-C

## 2021-08-03 ENCOUNTER — Other Ambulatory Visit: Payer: Medicare Other

## 2021-08-09 ENCOUNTER — Ambulatory Visit (INDEPENDENT_AMBULATORY_CARE_PROVIDER_SITE_OTHER): Payer: Medicare Other | Admitting: *Deleted

## 2021-08-09 ENCOUNTER — Other Ambulatory Visit: Payer: Self-pay

## 2021-08-09 DIAGNOSIS — Z23 Encounter for immunization: Secondary | ICD-10-CM

## 2021-08-16 ENCOUNTER — Ambulatory Visit: Payer: Medicare Other | Admitting: Family Medicine

## 2021-08-18 ENCOUNTER — Telehealth: Payer: Self-pay

## 2021-08-18 ENCOUNTER — Other Ambulatory Visit: Payer: Self-pay

## 2021-08-18 MED ORDER — MELOXICAM 15 MG PO TABS: 15.0000 mg | ORAL_TABLET | Freq: Every day | ORAL | 3 refills | Status: DC

## 2021-08-18 NOTE — Telephone Encounter (Signed)
Medication has been sent in 

## 2021-08-18 NOTE — Telephone Encounter (Signed)
  Encourage patient to contact the pharmacy for refills or they can request refills through [facility]:  07/28/21  NEXT APPOINTMENT DATE:  MEDICATION: meloxicam (MOBIC) 15 MG tablet  Is the patient out of medication? yes  PHARMACY: [facility], [place]  COMMENTS: Patient states that [facility] is out of business.   Let patient know to contact pharmacy at the end of the day to make sure medication is ready.  Please notify patient to allow 48-72 hours to process

## 2021-08-21 ENCOUNTER — Other Ambulatory Visit: Payer: Self-pay

## 2021-08-21 ENCOUNTER — Telehealth: Payer: Self-pay

## 2021-08-21 MED ORDER — AMLODIPINE BESYLATE 5 MG PO TABS: 5.0000 mg | ORAL_TABLET | Freq: Every day | ORAL | 1 refills | Status: DC

## 2021-08-21 NOTE — Telephone Encounter (Signed)
MEDICATION: amLODipine (NORVASC) 5 MG tablet (Expired)  PHARMACY:  [facility], [place] Phone:  [phone]  Fax:  [phone]      Comments:   **Let patient know to contact pharmacy at the end of the day to make sure medication is ready. **  ** Please notify patient to allow 48-72 hours to process**  **Encourage patient to contact the pharmacy for refills or they can request refills through [facility]**

## 2021-08-21 NOTE — Telephone Encounter (Signed)
Medication has been sent in 

## 2021-08-25 ENCOUNTER — Ambulatory Visit: Payer: Medicare Other | Admitting: Family Medicine

## 2021-08-28 ENCOUNTER — Ambulatory Visit (INDEPENDENT_AMBULATORY_CARE_PROVIDER_SITE_OTHER): Payer: Medicare Other | Admitting: Family Medicine

## 2021-08-28 ENCOUNTER — Encounter: Payer: Self-pay | Admitting: Family Medicine

## 2021-08-28 ENCOUNTER — Other Ambulatory Visit: Payer: Self-pay

## 2021-08-28 VITALS — BP 118/80 | HR 60 | Temp 98.0°F | Ht 67.0 in | Wt 185.0 lb

## 2021-08-28 DIAGNOSIS — N183 Chronic kidney disease, stage 3 unspecified: Secondary | ICD-10-CM | POA: Diagnosis not present

## 2021-08-28 DIAGNOSIS — E1159 Type 2 diabetes mellitus with other circulatory complications: Secondary | ICD-10-CM | POA: Diagnosis not present

## 2021-08-28 DIAGNOSIS — N1831 Chronic kidney disease, stage 3a: Secondary | ICD-10-CM | POA: Diagnosis not present

## 2021-08-28 DIAGNOSIS — H524 Presbyopia: Secondary | ICD-10-CM | POA: Diagnosis not present

## 2021-08-28 DIAGNOSIS — E1122 Type 2 diabetes mellitus with diabetic chronic kidney disease: Secondary | ICD-10-CM | POA: Diagnosis not present

## 2021-08-28 DIAGNOSIS — I152 Hypertension secondary to endocrine disorders: Secondary | ICD-10-CM

## 2021-08-28 DIAGNOSIS — H2513 Age-related nuclear cataract, bilateral: Secondary | ICD-10-CM | POA: Diagnosis not present

## 2021-08-28 DIAGNOSIS — F17200 Nicotine dependence, unspecified, uncomplicated: Secondary | ICD-10-CM

## 2021-08-28 DIAGNOSIS — I2510 Atherosclerotic heart disease of native coronary artery without angina pectoris: Secondary | ICD-10-CM

## 2021-08-28 DIAGNOSIS — I739 Peripheral vascular disease, unspecified: Secondary | ICD-10-CM | POA: Insufficient documentation

## 2021-08-28 DIAGNOSIS — F3341 Major depressive disorder, recurrent, in partial remission: Secondary | ICD-10-CM | POA: Diagnosis not present

## 2021-08-28 MED ORDER — AMLODIPINE BESYLATE 5 MG PO TABS: 5.0000 mg | ORAL_TABLET | Freq: Every day | ORAL | 3 refills | Status: DC

## 2021-08-28 MED ORDER — ALPRAZOLAM 0.5 MG PO TABS: ORAL_TABLET | ORAL | 0 refills | Status: DC

## 2021-08-28 NOTE — Progress Notes (Signed)
Subjective  CC:  Chief Complaint  Patient presents with   Hypertension    HPI: Jessica Curry is a 70 y.o. female who presents to the office today to address the problems listed above in the chief complaint. Hypertension f/u: Control is good . Pt reports she is doing well. taking medications as instructed, no medication side effects noted, no TIAs, no chest pain on exertion, no dyspnea on exertion, no swelling of ankles. Now on amlodipine 5, lisinopril 40 and clonodine tid. Bp has responded well. She denies adverse effects from his BP medications. Compliance with medication is good. I reviewed ov from last two times for HTN management.  CKD: no edema. No nausea.  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 Also uses tylenol. Doing well overall; had steroid injections over summer with some benefit CAD w/o chest pain   Lab Results  Component Value Date   HGBA1C 6.6 (H) 05/10/2021   HGBA1C 6.9 (H) 12/26/2020   HGBA1C 6.5 (H) 09/15/2020    Lab Results  Component Value Date   CREATININE 1.00 05/26/2021   BUN 15 05/26/2021   NA 132 (L) 05/26/2021   K 4.9 05/26/2021   CL 99 05/26/2021   CO2 27 05/26/2021      Assessment  1. Hypertension associated with diabetes ([place])   2. Coronary artery disease involving native coronary artery of native heart without angina pectoris   3. Chronic kidney disease, stage 3a ([place])   4. Depression, major, recurrent, in partial remission ([place])   5. 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 Smoker      Plan   Hypertension f/u: BP control is well controlled. Now under good control. Continue lisinopril 40, clonodine 0.1 tid and amlodipine 5mg  daily DM f/u: clinically controlled. Contineu metformin. Monitor renal function. May need to cut back on dose if progresses CAD isstable Depression: doing well in spite of unexpected death  of brother (CVA). + stress due to primary caretaker of mother, 77 yo.  Smoker.   Education regarding management of these chronic disease states was given. Management strategies discussed on successive visits include dietary and exercise recommendations, goals of achieving and maintaining IBW, and lifestyle modifications aiming for adequate sleep and minimizing stressors.   Follow up: 4 months for cpe  No orders of the defined types were placed in this encounter.  Meds ordered this encounter  Medications   ALPRAZolam (XANAX) 0.5 MG tablet    Sig: TAKE ONE TABLET BY MOUTH DAILY AS NEEDED FOR ANXIETY    Dispense:  30 tablet    Refill:  0   amLODipine (NORVASC) 5 MG tablet    Sig: Take 1 tablet (5 mg total) by mouth daily.    Dispense:  90 tablet    Refill:  3       BP Readings from Last 3 Encounters:  08/28/21 118/80  07/28/21 (!) 162/84  07/21/21 (!) 196/98   Wt Readings from Last 3 Encounters:  08/28/21 185 lb (83.9 kg)  07/28/21 180 lb 3.2 oz (81.7 kg)  07/21/21 187 lb 9.6 oz (85.1 kg)    Lab Results  Component Value Date   CHOL 158 12/26/2020   CHOL 144 09/15/2020   CHOL 164 09/17/2019   Lab Results  Component Value Date   HDL 43.10 12/26/2020   HDL 39 (L) 09/15/2020   HDL 41.40 09/17/2019   Lab  Results  Component Value Date   LDLCALC 78 09/15/2020   Lab Results  Component Value Date   TRIG 226.0 (H) 12/26/2020   TRIG 174 (H) 09/15/2020   TRIG 226.0 (H) 09/17/2019   Lab Results  Component Value Date   CHOLHDL 4 12/26/2020   CHOLHDL 3.7 09/15/2020   CHOLHDL 4 09/17/2019   Lab Results  Component Value Date   LDLDIRECT 96.0 12/26/2020   LDLDIRECT 96.0 09/17/2019   LDLDIRECT 100.0 05/26/2018   Lab Results  Component Value Date   CREATININE 1.00 05/26/2021   BUN 15 05/26/2021   NA 132 (L) 05/26/2021   K 4.9 05/26/2021   CL 99 05/26/2021   CO2 27 05/26/2021    The 10-year ASCVD risk score (Arnett DK, et al., 2019) is: 30%   Values used to  calculate the score:     Age: 88 years     Sex: Female     Is Non-Hispanic African American: No     Diabetic: Yes     Tobacco smoker: Yes     Systolic Blood Pressure: 601 mmHg     Is BP treated: Yes     HDL Cholesterol: 43.1 mg/dL     Total Cholesterol: 158 mg/dL  I reviewed the patients updated PMH, FH, and SocHx.    Patient Active Problem List   Diagnosis Date Noted   History of heart artery stent 02/10/2020    Priority: 1.   Simple chronic bronchitis ([place]) 02/11/2018    Priority: 1.   Hyperlipidemia associated with type 2 diabetes mellitus ([place]) 06/04/2017    Priority: 1.   Type 2 diabetes mellitus with nephropathy ([place]) 09/10/2016    Priority: 1.   Hypertension associated with diabetes ([place]) 09/10/2016    Priority: 1.   Smoker 09/10/2016    Priority: 1.   Coronary artery disease of native artery of native heart with stable angina pectoris ([place])     Priority: 1.   Depression, major, recurrent, in partial remission ([place])     Priority: 1.   History of sepsis 05/11/2021    Priority: 2.   Chronic hyponatremia 02/07/2021    Priority: 2.   Bilateral renal cysts 02/06/2021    Priority: 2.   Chronic midline low back pain without sciatica 12/26/2020    Priority: 2.   Serrated polyp of colon 08/12/2018    Priority: 2.   Osteopenia 06/10/2017    Priority: 2.   Encounter for screening for lung cancer 02/10/2020    Priority: 3.   Mild intermittent asthma 08/06/2019    Priority: 3.   Chronic kidney disease, stage 3a ([place]) 02/07/2021    Priority: 1.   Alcohol use 02/07/2021    Priority: 3.   Claudication ([place]) 08/28/2021    Allergies: Patient has no known allergies.  Social History: Patient  reports that she has been smoking cigarettes. She has a 14.40 pack-year smoking history. She has never used smokeless tobacco. She reports current alcohol use of about 4.0 standard drinks per week. She reports that she does not use drugs.  Current Meds  Medication Sig   albuterol  (PROAIR HFA) 108 (90 Base) MCG/ACT inhaler Inhale 2 puffs into the lungs every 6 (six) hours as needed for wheezing or shortness of breath.   aspirin 81 MG tablet Take 81 mg by mouth at bedtime.   atorvastatin (LIPITOR) 20 MG tablet Take 20 mg by mouth daily.   cloNIDine (CATAPRES) 0.1 MG tablet TAKE 1 TABLET BY MOUTH 3  TIMES DAILY   diphenhydrAMINE (BENADRYL) 25 MG tablet Take 25 mg by mouth every 6 (six) hours as needed for allergies or sleep.   fluocinonide (LIDEX) 0.05 % external solution Apply 1 application topically daily.   lisinopril (ZESTRIL) 40 MG tablet Take 1 tablet (40 mg total) by mouth daily.   meloxicam (MOBIC) 15 MG tablet Take 1 tablet (15 mg total) by mouth daily.   metFORMIN (GLUCOPHAGE) 1000 MG tablet TAKE 1 TABLET BY MOUTH  TWICE DAILY WITH MEALS   Multiple Vitamins-Minerals (MULTIVITAMIN WITH MINERALS) tablet Take 1 tablet by mouth daily. Centrum silver for women   venlafaxine XR (EFFEXOR-XR) 75 MG 24 hr capsule TAKE THREE CAPSULES BY MOUTH DAILY WITH BREAKFAST   [DISCONTINUED] ALPRAZolam (XANAX) 0.5 MG tablet TAKE ONE TABLET BY MOUTH DAILY AS NEEDED FOR ANXIETY   [DISCONTINUED] amLODipine (NORVASC) 5 MG tablet Take 1 tablet (5 mg total) by mouth daily.    Review of Systems: Cardiovascular: negative for chest pain, palpitations, leg swelling, orthopnea Respiratory: negative for SOB, wheezing or persistent cough Gastrointestinal: negative for abdominal pain Genitourinary: negative for dysuria or gross hematuria  Objective  Vitals: BP 118/80   Pulse 60   Temp 98 F (36.7 C) (Temporal)   Ht 5\' 7"  (1.702 m)   Wt 185 lb (83.9 kg)   LMP  (LMP Unknown)   SpO2 96%   BMI 28.98 kg/m  General: no acute distress  Psych:  Alert and oriented, normal mood and affect HEENT:  Normocephalic, atraumatic, supple neck  Cardiovascular:  RRR without murmur. no edema Respiratory:  Good breath sounds bilaterally, CTAB with normal respiratory effort Skin:  Warm, no  rashes Neurologic:   Mental status is normal Commons side effects, risks, benefits, and alternatives for medications and treatment plan prescribed today were discussed, and the patient expressed understanding of the given instructions. Patient is instructed to call or message via MyChart if he/she has any questions or concerns regarding our treatment plan. No barriers to understanding were identified. We discussed Red Flag symptoms and signs in detail. Patient expressed understanding regarding what to do in case of urgent or emergency type symptoms.  Medication list was reconciled, printed and provided to the patient in AVS. Patient instructions and summary information was reviewed with the patient as documented in the AVS. This note was prepared with assistance of Dragon voice recognition software. Occasional wrong-word or sound-a-like substitutions may have occurred due to the inherent limitations of voice recognition software  This visit occurred during the SARS-CoV-2 public health emergency.  Safety protocols were in place, including screening questions prior to the visit, additional usage of staff PPE, and extensive cleaning of exam room while observing appropriate contact time as indicated for disinfecting solutions.

## 2021-08-28 NOTE — Patient Instructions (Signed)
Please return in January 2023 for your annual complete physical; please come fasting.   Please schedule your mammogram. You are now due.  Take care of yourself and call me if you need something.   If you have any questions or concerns, please don't hesitate to send me a message via MyChart or call the office at [phone]. Thank you for visiting with [place] today! It's our pleasure caring for you.

## 2021-08-31 ENCOUNTER — Telehealth: Payer: Self-pay

## 2021-08-31 NOTE — Telephone Encounter (Signed)
Spoke with patient, clarified which flu shot she received

## 2021-08-31 NOTE — Telephone Encounter (Signed)
Pt called wanting to discuss the flu vaccine. She stated that she received it about 2 weeks ago and has a few questions. Please Advise.

## 2021-09-04 ENCOUNTER — Ambulatory Visit: Payer: Medicare Other | Attending: Internal Medicine

## 2021-09-04 ENCOUNTER — Other Ambulatory Visit (HOSPITAL_BASED_OUTPATIENT_CLINIC_OR_DEPARTMENT_OTHER): Payer: Self-pay

## 2021-09-04 DIAGNOSIS — Z23 Encounter for immunization: Secondary | ICD-10-CM

## 2021-09-04 MED ORDER — PFIZER COVID-19 VAC BIVALENT 30 MCG/0.3ML IM SUSP
INTRAMUSCULAR | 0 refills | Status: DC
  Filled 2021-09-04: qty 0.3, 1d supply, fill #0

## 2021-09-04 NOTE — Progress Notes (Signed)
   Covid-19 Vaccination Clinic  Name:  MCKELL RIECKE    MRN: [id number] DOB: September 28, 1951  09/04/2021  Ms. Mellott was observed post Covid-19 immunization for 15 minutes without incident. She was provided with Vaccine Information Sheet and instruction to access the V-Safe system.   Ms. Hackbart was instructed to call 911 with any severe reactions post vaccine: Difficulty breathing  Swelling of face and throat  A fast heartbeat  A bad rash all over body  Dizziness and weakness

## 2021-09-08 ENCOUNTER — Ambulatory Visit
Admission: RE | Admit: 2021-09-08 | Discharge: 2021-09-08 | Disposition: A | Discharge status: Home / Self Care | Payer: Medicare Other | Source: Ambulatory Visit | Attending: Family Medicine | Admitting: Family Medicine

## 2021-09-08 DIAGNOSIS — N281 Cyst of kidney, acquired: Secondary | ICD-10-CM | POA: Diagnosis not present

## 2021-09-12 ENCOUNTER — Other Ambulatory Visit: Payer: Self-pay

## 2021-09-12 DIAGNOSIS — N281 Cyst of kidney, acquired: Secondary | ICD-10-CM

## 2021-09-12 NOTE — Progress Notes (Signed)
Please call patient: I have reviewed his/her lab results. Ultrasound shows persistent cyst. Recommend renal MRI for further clarification to ensure it is not a solid lesion. Please order. Thanks. Notify pt

## 2021-09-13 DIAGNOSIS — H2512 Age-related nuclear cataract, left eye: Secondary | ICD-10-CM | POA: Diagnosis not present

## 2021-09-18 ENCOUNTER — Telehealth: Payer: Self-pay | Admitting: Pharmacist

## 2021-09-18 NOTE — Chronic Care Management (AMB) (Signed)
Chronic Care Management Pharmacy Assistant   Name: Jessica Curry  MRN: [id number] DOB: [birth year]   Reason for Encounter: Hypertension Adherence Call    Recent office visits:  08/28/2021 OV (PCP) Leamon Arnt, MD; no medication changes indicated.  07/28/2021 OV Allwardt, Alyssa M, PA-C; no medication changes indicated.  07/21/2021  OV Allwardt, Randa Evens, PA-C;  She did not realize that she was supposed to be taking amlodipine 5 mg.  I sent this prescription to her pharmacy again for her and she is going to start on her first dose today.  She is going to continue lisinopril 40 mg once daily and clonidine 0.1 mg 3 times daily.  Recent consult visits:  None  Hospital visits:  None in previous 6 months  Medications: Outpatient Encounter Medications as of 09/18/2021  Medication Sig Note   albuterol (PROAIR HFA) 108 (90 Base) MCG/ACT inhaler Inhale 2 puffs into the lungs every 6 (six) hours as needed for wheezing or shortness of breath. 05/10/2021: Needs refill   ALPRAZolam (XANAX) 0.5 MG tablet TAKE ONE TABLET BY MOUTH DAILY AS NEEDED FOR ANXIETY    amLODipine (NORVASC) 5 MG tablet Take 1 tablet (5 mg total) by mouth daily.    aspirin 81 MG tablet Take 81 mg by mouth at bedtime.    atorvastatin (LIPITOR) 20 MG tablet Take 20 mg by mouth daily.    cloNIDine (CATAPRES) 0.1 MG tablet TAKE 1 TABLET BY MOUTH 3  TIMES DAILY    COVID-19 mRNA bivalent vaccine, Pfizer, (PFIZER COVID-19 VAC BIVALENT) injection Inject into the muscle.    diphenhydrAMINE (BENADRYL) 25 MG tablet Take 25 mg by mouth every 6 (six) hours as needed for allergies or sleep.    fluocinonide (LIDEX) 0.05 % external solution Apply 1 application topically daily.    lisinopril (ZESTRIL) 40 MG tablet Take 1 tablet (40 mg total) by mouth daily.    meloxicam (MOBIC) 15 MG tablet Take 1 tablet (15 mg total) by mouth daily.    metFORMIN (GLUCOPHAGE) 1000 MG tablet TAKE 1 TABLET BY MOUTH  TWICE DAILY WITH MEALS    Multiple  Vitamins-Minerals (MULTIVITAMIN WITH MINERALS) tablet Take 1 tablet by mouth daily. Centrum silver for women    nitroGLYCERIN (NITROSTAT) 0.4 MG SL tablet Place 1 tablet (0.4 mg total) under the tongue every 5 (five) minutes as needed for chest pain. 05/10/2021: Needs refill   venlafaxine XR (EFFEXOR-XR) 75 MG 24 hr capsule TAKE THREE CAPSULES BY MOUTH DAILY WITH BREAKFAST    No facility-administered encounter medications on file as of 09/18/2021.   Reviewed chart prior to disease state call. Spoke with patient regarding BP  Recent Office Vitals: BP Readings from Last 3 Encounters:  08/28/21 118/80  07/28/21 (!) 162/84  07/21/21 (!) 196/98   Pulse Readings from Last 3 Encounters:  08/28/21 60  07/28/21 (!) 56  07/21/21 (!) 57    Wt Readings from Last 3 Encounters:  08/28/21 185 lb (83.9 kg)  07/28/21 180 lb 3.2 oz (81.7 kg)  07/21/21 187 lb 9.6 oz (85.1 kg)     Kidney Function Lab Results  Component Value Date/Time   CREATININE 1.00 05/26/2021 10:10 AM   CREATININE 1.09 (H) 05/12/2021 05:10 AM   CREATININE 1.15 (H) 02/01/2021 03:35 PM   CREATININE 1.00 (H) 09/15/2020 10:25 AM   GFR 57.33 (L) 05/26/2021 10:10 AM   GFRNONAA 55 (L) 05/12/2021 05:10 AM   GFRNONAA 49 (L) 02/01/2021 03:35 PM   GFRAA 56 (L) 02/01/2021 03:35  PM    BMP Latest Ref Rng & Units 05/26/2021 05/12/2021 05/11/2021  Glucose 70 - 99 mg/dL 99 101(H) 122(H)  BUN 6 - 23 mg/dL 15 12 19   Creatinine 0.40 - 1.20 mg/dL 1.00 1.09(H) 1.24(H)  BUN/Creat Ratio 6 - 22 (calc) - - -  Sodium 135 - 145 mEq/L 132(L) 129(L) 131(L)  Potassium 3.5 - 5.1 mEq/L 4.9 3.0(L) 3.6  Chloride 96 - 112 mEq/L 99 95(L) 95(L)  CO2 19 - 32 mEq/L 27 25 24   Calcium 8.4 - 10.5 mg/dL 10.1 9.1 9.8    Current antihypertensive regimen:  Amlodipine 5 mg daily Lisinopril 40 mg daily Clonidine 0.1 mg three times daily  How often are you checking your Blood Pressure? infrequently  Current home BP readings: 120's/80's  What recent  interventions/DTPs have been made by any provider to improve Blood Pressure control since last CPP Visit: Patient did not know she was supposed to be taking Amlodipine so she recently started taking. She states she can tell that her blood pressure has been a little high. She states she has been under some stress with having an eye surgery. She states she is supposed to have another surgery on the other eye next week.  Any recent hospitalizations or ED visits since last visit with CPP? No  What diet changes have been made to improve Blood Pressure Control?  Patient states she doesn't follow and diet and hasn't made any recent changes.  What exercise is being done to improve your Blood Pressure Control?  Patient states she is not exercising at this time.  Adherence Review: Is the patient currently on ACE/ARB medication? Yes Does the patient have >5 day gap between last estimated fill dates? No   Care Gaps: Medicare Annual Wellness: Completed Ophthalmology Exam: Next due on 03/06/2022 Foot Exam: Next due on 12/26/2021 Hemoglobin A1C: 6.6% on 05/10/2021 Colonoscopy: Next due on 08/13/2023 Dexa Scan: Next due on 08/25/2022 Mammogram: Overdue since 08/25/2021   Future Appointments  Date Time Provider [place]  10/18/2021  3:00 PM [facility] CCM PHARMACIST [facility]  06/01/2022  8:45 AM [facility] HEALTH COACH [facility]    Star Rating Drugs: Atorvastatin 20 mg last filled 06/15/2021 90 DS Lisinopril 40 mg last filled 07/03/2021 90 DS Metformin 1000 mg last filled 07/21/2021 90 DS  April D Calhoun, [place] Pharmacist Assistant [phone]

## 2021-09-21 ENCOUNTER — Telehealth: Payer: Self-pay

## 2021-09-21 NOTE — Telephone Encounter (Signed)
Please advise 

## 2021-09-21 NOTE — Telephone Encounter (Signed)
[facility] called back with patient on the line.  Requesting patient to be seen in the next 4 hours.  Our office and other [facility] offices did not have an opening for today.  [facility] referred patient to UC.

## 2021-09-21 NOTE — Telephone Encounter (Signed)
Patient has called in stating she has had high bp for a week 200/100.   I advised we needed to get triaged.  Patient was reluctant.  I transferred over to [facility].  Patient disconnected during the call.  [facility] advised they would give patient a call back to triage.

## 2021-09-21 NOTE — Telephone Encounter (Signed)
Patient Name: Jessica Curry Gender: Female DOB: 1951/03/16 Age: 70 Y 2 M 17 D Return Phone Number: [phone] (Primary) Address: City/ State/ Zip: [place]  [address] [facility] [place] [facility] [place] [facility] Physician Billey Chang- MD Contact Type Call Who Is Calling Patient / Member / Family / Caregiver Call Type Triage / Clinical Relationship To Patient Self Return Phone Number [phone] (Primary) Chief Complaint BLOOD PRESSURE HIGH - Systolic (top number) 132 or greater Reason for Call Symptomatic / Request for Health Information Initial Comment past week her BP has been running over 200. Caller hung up before being transferred by the office, unable to verify information. Translation No Nurse Assessment Nurse: Windle Guard, RN, Olin Hauser Date/Time (Eastern Time): 09/21/2021 12:40:08 PM Confirm and document reason for call. If symptomatic, describe symptoms. ---Caller states for the past week her BP has been running over 200. Last BP 206/102 (during call), had not taken meds today. Nurse instructed caller to take meds and she did, then call continued. Denies symptoms. Does the patient have any new or worsening symptoms? ---Yes Will a triage be completed? ---Yes Related visit to physician within the last 2 weeks? ---No Does the PT have any chronic conditions? (i.e. diabetes, asthma, this includes High risk factors for pregnancy, etc.) ---Yes List chronic conditions. ---HTN, Diabetes, Depression, Anxiety, Hyperlipidemia Is this a behavioral health or substance abuse call? ---No Guidelines Guideline Title Affirmed Question Affirmed Notes Nurse Date/Time (Eastern Time) Blood Pressure - High [4] Systolic BP >= 401 OR Diastolic >= 027 AND [2] Windle Guard, RN, Olin Hauser 09/21/2021 12:46:01 PM PLEASE NOTE: All timestamps contained within this report are represented as [place] Standard  Time. CONFIDENTIALTY NOTICE: This fax transmission is intended only for the addressee. It contains information that is legally privileged, confidential or otherwise protected from use or disclosure. If you are not the intended recipient, you are strictly prohibited from reviewing, disclosing, copying using or disseminating any of this information or taking any action in reliance on or regarding this information. If you have received this fax in error, please notify us immediately by telephone so that we can arrange for its return to [place]. Phone: [phone], Toll-Free: [phone], Fax: [phone] Page: 2 of 2 Call Id: [id number] Guidelines Guideline Title Affirmed Question Affirmed Notes Nurse Date/Time Eilene Ghazi Time) having NO cardiac or neurologic symptoms Disp. Time Eilene Ghazi Time) Disposition Final User 09/21/2021 12:38:36 PM Send to Urgent Queue Donato Heinz 09/21/2021 12:48:15 PM See HCP within 4 Hours (or PCP triage) Yes Windle Guard, RN, Otho Najjar Disagree/Comply Comply Caller Understands Yes PreDisposition Call Doctor Care Advice Given Per Guideline SEE HCP (OR PCP TRIAGE) WITHIN 4 HOURS: * IF OFFICE WILL BE OPEN: You need to be seen within the next 3 or 4 hours. Call your doctor (or NP/PA) now or as soon as the office opens. * UCC: Some UCCs can manage patients who are stable and have less serious symptoms (e.g., minor illnesses and injuries). The triager must know the [facility] capabilities before sending a patient there. If unsure, call ahead. CALL BACK IF: * Weakness or numbness of the face, arm or leg on one side of the body occurs * Difficulty walking, difficulty talking, or severe headache occurs * Chest pain or difficulty breathing occurs * You become worse CARE ADVICE given per High Blood Pressure (Adult) guideline. Comments User: Harvin Hazel, RN Date/Time Eilene Ghazi Time): 09/21/2021 12:57:40 PM Per client directive nurse contacted backline to schedule appointment. Spoke w/  tammy who states there sre no available appoinments w/in outcome time frame and advised to have cller se seen at Urgent Care. Referrals GO TO FACILITY UNDECIDED

## 2021-09-21 NOTE — Telephone Encounter (Signed)
FYI

## 2021-09-22 NOTE — Telephone Encounter (Signed)
Patient is scheduled for next week.

## 2021-09-25 ENCOUNTER — Ambulatory Visit (INDEPENDENT_AMBULATORY_CARE_PROVIDER_SITE_OTHER): Payer: Medicare Other | Admitting: Family Medicine

## 2021-09-25 ENCOUNTER — Encounter: Payer: Self-pay | Admitting: Family Medicine

## 2021-09-25 ENCOUNTER — Other Ambulatory Visit: Payer: Self-pay

## 2021-09-25 VITALS — BP 156/88 | HR 60 | Temp 99.0°F | Ht 67.0 in | Wt 186.2 lb

## 2021-09-25 DIAGNOSIS — R8290 Unspecified abnormal findings in urine: Secondary | ICD-10-CM

## 2021-09-25 DIAGNOSIS — J410 Simple chronic bronchitis: Secondary | ICD-10-CM | POA: Diagnosis not present

## 2021-09-25 DIAGNOSIS — F17219 Nicotine dependence, cigarettes, with unspecified nicotine-induced disorders: Secondary | ICD-10-CM

## 2021-09-25 DIAGNOSIS — N281 Cyst of kidney, acquired: Secondary | ICD-10-CM | POA: Diagnosis not present

## 2021-09-25 DIAGNOSIS — E1159 Type 2 diabetes mellitus with other circulatory complications: Secondary | ICD-10-CM | POA: Diagnosis not present

## 2021-09-25 DIAGNOSIS — I152 Hypertension secondary to endocrine disorders: Secondary | ICD-10-CM | POA: Diagnosis not present

## 2021-09-25 LAB — POCT URINALYSIS DIPSTICK
Bilirubin, UA: NEGATIVE
Blood, UA: NEGATIVE
Glucose, UA: NEGATIVE
Ketones, UA: NEGATIVE
Leukocytes, UA: NEGATIVE
Nitrite, UA: NEGATIVE
Protein, UA: POSITIVE — AB
Spec Grav, UA: 1.015 (ref 1.010–1.025)
Urobilinogen, UA: 0.2 E.U./dL
pH, UA: 6 (ref 5.0–8.0)

## 2021-09-25 MED ORDER — ALBUTEROL SULFATE HFA 108 (90 BASE) MCG/ACT IN AERS: 2.0000 | INHALATION_SPRAY | Freq: Four times a day (QID) | RESPIRATORY_TRACT | 1 refills | Status: DC | PRN

## 2021-09-25 NOTE — Addendum Note (Signed)
Addended by: Loura Back on: 09/25/2021 01:24 PM   Modules accepted: Orders

## 2021-09-25 NOTE — Addendum Note (Signed)
Addended by: Loura Back on: 09/25/2021 10:09 AM   Modules accepted: Orders

## 2021-09-25 NOTE — Patient Instructions (Signed)
Please follow up as scheduled for your next visit with me: Visit date not found   If you have any questions or concerns, please don't hesitate to send me a message via MyChart or call the office at [phone]. Thank you for visiting with [place] today! It's our pleasure caring for you.

## 2021-09-25 NOTE — Progress Notes (Signed)
Subjective  CC:  Chief Complaint  Patient presents with   Hypertension    HPI: Jessica Curry is a 70 y.o. female who presents to the office today to address the problems listed above in the chief complaint. Hypertension f/u: pt was seen 3-4 weeks with normal bp. On 3 meds. Clinical pharm asked to start checking daily. She has been getting very elevated home readings with one of her cuffs. The other cuff only gave mildly elevated readings. She denies cp or sob. Taking meds as directed. Has some leg pain and chronic copd cough.  MRI: still needs to be scheduled to f/u on renal mass/cyst.  Assessment  1. Hypertension associated with diabetes ([place])   2. Nicotine dependence, cigarettes, w unsp disorders   3. Simple chronic bronchitis ([place])   4. Bilateral renal cysts      Plan   Hypertension f/u: BP control is fairly well controlled. Mildly elevated here in office today but no where near her home readings. I suspect cuff is inaccurate. Bp was very good just weeks ago in office. Could have pain or stress response. Monitor. Continue home meds. Recheck 3 months.  copd f/u: smoker. Precontemplative. Education regarding management of these chronic disease states was given. Management strategies discussed on successive visits include dietary and exercise recommendations, goals of achieving and maintaining IBW, and lifestyle modifications aiming for adequate sleep and minimizing stressors.   Follow up: 3 mo cpe  No orders of the defined types were placed in this encounter.  Meds ordered this encounter  Medications   albuterol (PROAIR HFA) 108 (90 Base) MCG/ACT inhaler    Sig: Inhale 2 puffs into the lungs every 6 (six) hours as needed for wheezing or shortness of breath.    Dispense:  3 each    Refill:  1      BP Readings from Last 3 Encounters:  09/25/21 (!) 156/88  08/28/21 118/80  07/28/21 (!) 162/84   Wt Readings from Last 3 Encounters:  09/25/21 186 lb 3.2 oz (84.5 kg)   08/28/21 185 lb (83.9 kg)  07/28/21 180 lb 3.2 oz (81.7 kg)    Lab Results  Component Value Date   CHOL 158 12/26/2020   CHOL 144 09/15/2020   CHOL 164 09/17/2019   Lab Results  Component Value Date   HDL 43.10 12/26/2020   HDL 39 (L) 09/15/2020   HDL 41.40 09/17/2019   Lab Results  Component Value Date   LDLCALC 78 09/15/2020   Lab Results  Component Value Date   TRIG 226.0 (H) 12/26/2020   TRIG 174 (H) 09/15/2020   TRIG 226.0 (H) 09/17/2019   Lab Results  Component Value Date   CHOLHDL 4 12/26/2020   CHOLHDL 3.7 09/15/2020   CHOLHDL 4 09/17/2019   Lab Results  Component Value Date   LDLDIRECT 96.0 12/26/2020   LDLDIRECT 96.0 09/17/2019   LDLDIRECT 100.0 05/26/2018   Lab Results  Component Value Date   CREATININE 1.00 05/26/2021   BUN 15 05/26/2021   NA 132 (L) 05/26/2021   K 4.9 05/26/2021   CL 99 05/26/2021   CO2 27 05/26/2021    The 10-year ASCVD risk score (Arnett DK, et al., 2019) is: 46.5%   Values used to calculate the score:     Age: 4 years     Sex: Female     Is Non-Hispanic African American: No     Diabetic: Yes     Tobacco smoker: Yes     Systolic  Blood Pressure: 156 mmHg     Is BP treated: Yes     HDL Cholesterol: 43.1 mg/dL     Total Cholesterol: 158 mg/dL  I reviewed the patients updated PMH, FH, and SocHx.    Patient Active Problem List   Diagnosis Date Noted   History of heart artery stent 02/10/2020    Priority: 1.   Simple chronic bronchitis ([place]) 02/11/2018    Priority: 1.   Hyperlipidemia associated with type 2 diabetes mellitus ([place]) 06/04/2017    Priority: 1.   Type 2 diabetes mellitus with nephropathy ([place]) 09/10/2016    Priority: 1.   Hypertension associated with diabetes ([place]) 09/10/2016    Priority: 1.   Nicotine dependence, cigarettes, w unsp disorders 09/10/2016    Priority: 1.   Coronary artery disease of native artery of native heart with stable angina pectoris ([place])     Priority: 1.   Depression, major,  recurrent, in partial remission ([place])     Priority: 1.   History of sepsis 05/11/2021    Priority: 2.   Chronic hyponatremia 02/07/2021    Priority: 2.   Bilateral renal cysts 02/06/2021    Priority: 2.   Chronic midline low back pain without sciatica 12/26/2020    Priority: 2.   Serrated polyp of colon 08/12/2018    Priority: 2.   Osteopenia 06/10/2017    Priority: 2.   Encounter for screening for lung cancer 02/10/2020    Priority: 3.   Mild intermittent asthma 08/06/2019    Priority: 3.   Chronic kidney disease, stage 3a ([place]) 02/07/2021    Priority: 1.   Alcohol use 02/07/2021    Priority: 3.   Claudication ([place]) 08/28/2021    Allergies: Patient has no known allergies.  Social History: Patient  reports that she has been smoking cigarettes. She has a 14.40 pack-year smoking history. She has never used smokeless tobacco. She reports current alcohol use of about 4.0 standard drinks per week. She reports that she does not use drugs.  Current Meds  Medication Sig   ALPRAZolam (XANAX) 0.5 MG tablet TAKE ONE TABLET BY MOUTH DAILY AS NEEDED FOR ANXIETY   amLODipine (NORVASC) 5 MG tablet Take 1 tablet (5 mg total) by mouth daily.   aspirin 81 MG tablet Take 81 mg by mouth at bedtime.   atorvastatin (LIPITOR) 20 MG tablet Take 20 mg by mouth daily.   cloNIDine (CATAPRES) 0.1 MG tablet TAKE 1 TABLET BY MOUTH 3  TIMES DAILY   COVID-19 mRNA bivalent vaccine, Pfizer, (PFIZER COVID-19 VAC BIVALENT) injection Inject into the muscle.   diphenhydrAMINE (BENADRYL) 25 MG tablet Take 25 mg by mouth every 6 (six) hours as needed for allergies or sleep.   fluocinonide (LIDEX) 0.05 % external solution Apply 1 application topically daily.   lisinopril (ZESTRIL) 40 MG tablet Take 1 tablet (40 mg total) by mouth daily.   meloxicam (MOBIC) 15 MG tablet Take 1 tablet (15 mg total) by mouth daily.   metFORMIN (GLUCOPHAGE) 1000 MG tablet TAKE 1 TABLET BY MOUTH  TWICE DAILY WITH MEALS   Multiple  Vitamins-Minerals (MULTIVITAMIN WITH MINERALS) tablet Take 1 tablet by mouth daily. Centrum silver for women   venlafaxine XR (EFFEXOR-XR) 75 MG 24 hr capsule TAKE THREE CAPSULES BY MOUTH DAILY WITH BREAKFAST   [DISCONTINUED] albuterol (PROAIR HFA) 108 (90 Base) MCG/ACT inhaler Inhale 2 puffs into the lungs every 6 (six) hours as needed for wheezing or shortness of breath.    Review of Systems:  Cardiovascular: negative for chest pain, palpitations, leg swelling, orthopnea Respiratory: negative for SOB, wheezing or persistent cough Gastrointestinal: negative for abdominal pain Genitourinary: negative for dysuria or gross hematuria  Objective  Vitals: BP (!) 156/88   Pulse 60   Temp 99 F (37.2 C) (Temporal)   Ht 5\' 7"  (1.702 m)   Wt 186 lb 3.2 oz (84.5 kg)   LMP  (LMP Unknown)   SpO2 99%   BMI 29.16 kg/m  General: no acute distress  Psych:  Alert and oriented, normal mood and affect HEENT:  Normocephalic, atraumatic, supple neck  Cardiovascular:  RRR without murmur. no edema Respiratory:  Good breath sounds bilaterally, few exp wheezes Skin:  Warm, no rashes Neurologic:   Mental status is normal Commons side effects, risks, benefits, and alternatives for medications and treatment plan prescribed today were discussed, and the patient expressed understanding of the given instructions. Patient is instructed to call or message via MyChart if he/she has any questions or concerns regarding our treatment plan. No barriers to understanding were identified. We discussed Red Flag symptoms and signs in detail. Patient expressed understanding regarding what to do in case of urgent or emergency type symptoms.  Medication list was reconciled, printed and provided to the patient in AVS. Patient instructions and summary information was reviewed with the patient as documented in the AVS. This note was prepared with assistance of Dragon voice recognition software. Occasional wrong-word or sound-a-like  substitutions may have occurred due to the inherent limitations of voice recognition software  This visit occurred during the SARS-CoV-2 public health emergency.  Safety protocols were in place, including screening questions prior to the visit, additional usage of staff PPE, and extensive cleaning of exam room while observing appropriate contact time as indicated for disinfecting solutions.

## 2021-09-27 LAB — URINE CULTURE
MICRO NUMBER:: 12572456
SPECIMEN QUALITY:: ADEQUATE

## 2021-09-28 ENCOUNTER — Other Ambulatory Visit: Payer: Self-pay

## 2021-09-28 MED ORDER — SULFAMETHOXAZOLE-TRIMETHOPRIM 800-160 MG PO TABS: 1.0000 | ORAL_TABLET | Freq: Two times a day (BID) | ORAL | 0 refills | Status: DC

## 2021-09-28 NOTE — Progress Notes (Signed)
Please call patient: I have reviewed his/her lab results. Urine shows infection. Please order septra DS 1 po bid x 5 days #10, no refills and inform pt. Thanks

## 2021-10-02 ENCOUNTER — Telehealth: Payer: Self-pay

## 2021-10-02 NOTE — Telephone Encounter (Signed)
Spoke with patient, states she has had a fever all weekend and has some nausea. Thinks it is coming from the Septra. Says she has never taken sulfa drugs before. Will speak with PCP and see what next steps will be and call patient back

## 2021-10-03 ENCOUNTER — Ambulatory Visit (INDEPENDENT_AMBULATORY_CARE_PROVIDER_SITE_OTHER): Payer: Medicare Other | Admitting: Family Medicine

## 2021-10-03 ENCOUNTER — Encounter: Payer: Self-pay | Admitting: Family Medicine

## 2021-10-03 ENCOUNTER — Other Ambulatory Visit: Payer: Self-pay

## 2021-10-03 ENCOUNTER — Ambulatory Visit (HOSPITAL_BASED_OUTPATIENT_CLINIC_OR_DEPARTMENT_OTHER)
Admission: RE | Admit: 2021-10-03 | Discharge: 2021-10-03 | Disposition: A | Discharge status: Home / Self Care | Payer: Medicare Other | Source: Ambulatory Visit | Attending: Family Medicine | Admitting: Family Medicine

## 2021-10-03 VITALS — BP 116/78 | HR 63 | Temp 100.1°F | Ht 67.0 in | Wt 182.2 lb

## 2021-10-03 DIAGNOSIS — N390 Urinary tract infection, site not specified: Secondary | ICD-10-CM | POA: Diagnosis not present

## 2021-10-03 DIAGNOSIS — R1011 Right upper quadrant pain: Secondary | ICD-10-CM | POA: Insufficient documentation

## 2021-10-03 DIAGNOSIS — R112 Nausea with vomiting, unspecified: Secondary | ICD-10-CM | POA: Diagnosis not present

## 2021-10-03 DIAGNOSIS — B9689 Other specified bacterial agents as the cause of diseases classified elsewhere: Secondary | ICD-10-CM

## 2021-10-03 DIAGNOSIS — K8020 Calculus of gallbladder without cholecystitis without obstruction: Secondary | ICD-10-CM | POA: Diagnosis not present

## 2021-10-03 DIAGNOSIS — R509 Fever, unspecified: Secondary | ICD-10-CM | POA: Insufficient documentation

## 2021-10-03 DIAGNOSIS — R109 Unspecified abdominal pain: Secondary | ICD-10-CM | POA: Diagnosis not present

## 2021-10-03 LAB — CBC WITH DIFFERENTIAL/PLATELET
Basophils Absolute: 0 10*3/uL (ref 0.0–0.1)
Basophils Relative: 0.4 % (ref 0.0–3.0)
Eosinophils Absolute: 0 10*3/uL (ref 0.0–0.7)
Eosinophils Relative: 0.4 % (ref 0.0–5.0)
HCT: 36.9 % (ref 36.0–46.0)
Hemoglobin: 12.8 g/dL (ref 12.0–15.0)
Lymphocytes Relative: 5.8 % — ABNORMAL LOW (ref 12.0–46.0)
Lymphs Abs: 0.4 10*3/uL — ABNORMAL LOW (ref 0.7–4.0)
MCHC: 34.8 g/dL (ref 30.0–36.0)
MCV: 91.4 fl (ref 78.0–100.0)
Monocytes Absolute: 0.4 10*3/uL (ref 0.1–1.0)
Monocytes Relative: 5.6 % (ref 3.0–12.0)
Neutro Abs: 5.9 10*3/uL (ref 1.4–7.7)
Neutrophils Relative %: 87.8 % — ABNORMAL HIGH (ref 43.0–77.0)
Platelets: 233 10*3/uL (ref 150.0–400.0)
RBC: 4.03 Mil/uL (ref 3.87–5.11)
RDW: 12.9 % (ref 11.5–15.5)
WBC: 6.7 10*3/uL (ref 4.0–10.5)

## 2021-10-03 LAB — COMPREHENSIVE METABOLIC PANEL
ALT: 21 U/L (ref 0–35)
AST: 22 U/L (ref 0–37)
Albumin: 3.8 g/dL (ref 3.5–5.2)
Alkaline Phosphatase: 115 U/L (ref 39–117)
BUN: 18 mg/dL (ref 6–23)
CO2: 24 mEq/L (ref 19–32)
Calcium: 10.2 mg/dL (ref 8.4–10.5)
Chloride: 93 mEq/L — ABNORMAL LOW (ref 96–112)
Creatinine, Ser: 1.4 mg/dL — ABNORMAL HIGH (ref 0.40–1.20)
GFR: 38.19 mL/min — ABNORMAL LOW (ref >60.00)
Glucose, Bld: 120 mg/dL — ABNORMAL HIGH (ref 70–99)
Potassium: 4.8 mEq/L (ref 3.5–5.1)
Sodium: 126 mEq/L — ABNORMAL LOW (ref 135–145)
Total Bilirubin: 0.6 mg/dL (ref 0.2–1.2)
Total Protein: 6.4 g/dL (ref 6.0–8.3)

## 2021-10-03 LAB — LIPASE: Lipase: 31 U/L (ref 11.0–59.0)

## 2021-10-03 MED ORDER — AMLODIPINE BESYLATE 5 MG PO TABS: 5.0000 mg | ORAL_TABLET | Freq: Every day | ORAL | 3 refills | Status: DC

## 2021-10-03 MED ORDER — PROMETHAZINE HCL 25 MG PO TABS: 25.0000 mg | ORAL_TABLET | Freq: Four times a day (QID) | ORAL | 0 refills | Status: DC | PRN

## 2021-10-03 NOTE — Patient Instructions (Signed)
Please follow up if symptoms do not improve or as needed.    We need to check your gallbladder and blood work.   Treat your fevers and pain with tylenol.  Keep hydrated.   To ER for severe pain.

## 2021-10-03 NOTE — Progress Notes (Signed)
Subjective  CC:  Chief Complaint  Patient presents with   Nausea    Has no appetite, has shakes   Fever    100.1, has had a headache   Balance    Symptoms for about a week    HPI: Jessica Curry is a 70 y.o. female who presents to the office today to address the problems listed above in the chief complaint. 70 year old female with history of urosepsis and recent UTI, Klebsiella treated with Septra, on day 4 of antibiotic treatment, presents due to 2 to 3-day history of low-grade fevers, malaise, decreased appetite, dull abdominal pain, intermittent loose stool and 1 episode of vomiting.  She has urinary urgency but no dysuria.  No melena or bright red blood per rectum.  No midepigastric pain but does admit to right upper quadrant pain.  Described as dull.  He has not needed to take pain medication.  Feels weak.  No lightheadedness, palpitations, shortness of breath, URI symptoms, cough or rash.  No flank pain. She does have a history of cholelithiasis by imaging studies done.  Assessment  1. RUQ pain   2. Fever, unspecified fever cause   3. Nausea and vomiting, unspecified vomiting type   4. UTI due to Klebsiella species      Plan  Fever with abdominal pain nausea and vomiting: Differential is broad, but does include acute cholecystitis.  She is tender in the right upper quadrant.  Stat right upper quadrant ultrasound ordered.  Check CBC, CMP, lipase.  Hydrate, Phenergan for nausea, Tylenol for pain.  We will recheck in 24 to 48 hours by telephone.  Recheck urine to make sure cystitis is cleared.  Continue hydration.  Follow up: As needed 10/18/2021  Orders Placed This Encounter  Procedures   Urine Culture   US ABDOMEN LIMITED RUQ (LIVER/GB)   CBC with Differential/Platelet   Comprehensive metabolic panel   Lipase   Urinalysis, Routine w reflex microscopic   Meds ordered this encounter  Medications   amLODipine (NORVASC) 5 MG tablet    Sig: Take 1 tablet (5 mg total) by  mouth daily.    Dispense:  90 tablet    Refill:  3   promethazine (PHENERGAN) 25 MG tablet    Sig: Take 1 tablet (25 mg total) by mouth every 6 (six) hours as needed for nausea or vomiting.    Dispense:  20 tablet    Refill:  0      I reviewed the patients updated PMH, FH, and SocHx.    Patient Active Problem List   Diagnosis Date Noted   History of heart artery stent 02/10/2020    Priority: High   Simple chronic bronchitis ([place]) 02/11/2018    Priority: High   Hyperlipidemia associated with type 2 diabetes mellitus ([place]) 06/04/2017    Priority: High   Type 2 diabetes mellitus with nephropathy ([place]) 09/10/2016    Priority: High   Hypertension associated with diabetes ([place]) 09/10/2016    Priority: High   Nicotine dependence, cigarettes, w unsp disorders 09/10/2016    Priority: High   Coronary artery disease of native artery of native heart with stable angina pectoris [facility])     Priority: High   Depression, major, recurrent, in partial remission ([place])     Priority: High   History of sepsis 05/11/2021    Priority: Medium    Chronic hyponatremia 02/07/2021    Priority: Medium    Bilateral renal cysts 02/06/2021    Priority: Medium  Chronic midline low back pain without sciatica 12/26/2020    Priority: Medium    Serrated polyp of colon 08/12/2018    Priority: Medium    Osteopenia 06/10/2017    Priority: Medium    Encounter for screening for lung cancer 02/10/2020    Priority: Low   Mild intermittent asthma 08/06/2019    Priority: Low   Chronic kidney disease, stage 3a ([place]) 02/07/2021    Priority: 1.   Alcohol use 02/07/2021    Priority: 3.   Claudication ([place]) 08/28/2021   Current Meds  Medication Sig   albuterol (PROAIR HFA) 108 (90 Base) MCG/ACT inhaler Inhale 2 puffs into the lungs every 6 (six) hours as needed for wheezing or shortness of breath.   ALPRAZolam (XANAX) 0.5 MG tablet TAKE ONE TABLET BY MOUTH DAILY AS NEEDED FOR ANXIETY   aspirin 81 MG tablet Take  81 mg by mouth at bedtime.   atorvastatin (LIPITOR) 20 MG tablet Take 20 mg by mouth daily.   cloNIDine (CATAPRES) 0.1 MG tablet TAKE 1 TABLET BY MOUTH 3  TIMES DAILY   COVID-19 mRNA bivalent vaccine, Pfizer, (PFIZER COVID-19 VAC BIVALENT) injection Inject into the muscle.   diphenhydrAMINE (BENADRYL) 25 MG tablet Take 25 mg by mouth every 6 (six) hours as needed for allergies or sleep.   fluocinonide (LIDEX) 0.05 % external solution Apply 1 application topically daily.   lisinopril (ZESTRIL) 40 MG tablet Take 1 tablet (40 mg total) by mouth daily.   meloxicam (MOBIC) 15 MG tablet Take 1 tablet (15 mg total) by mouth daily.   metFORMIN (GLUCOPHAGE) 1000 MG tablet TAKE 1 TABLET BY MOUTH  TWICE DAILY WITH MEALS   Multiple Vitamins-Minerals (MULTIVITAMIN WITH MINERALS) tablet Take 1 tablet by mouth daily. Centrum silver for women   promethazine (PHENERGAN) 25 MG tablet Take 1 tablet (25 mg total) by mouth every 6 (six) hours as needed for nausea or vomiting.   sulfamethoxazole-trimethoprim (BACTRIM DS) 800-160 MG tablet Take 1 tablet by mouth 2 (two) times daily.   venlafaxine XR (EFFEXOR-XR) 75 MG 24 hr capsule TAKE THREE CAPSULES BY MOUTH DAILY WITH BREAKFAST   [DISCONTINUED] amLODipine (NORVASC) 5 MG tablet Take 1 tablet (5 mg total) by mouth daily.    Allergies: Patient has No Known Allergies. Family History: Patient family history includes AAA (abdominal aortic aneurysm) in her mother; Arthritis in her brother and mother; Dementia in her maternal grandmother; Heart attack in her brother; Hyperlipidemia in her brother; Hypertension in her brother; Hypothyroidism in her mother; Kidney disease in her mother; Other in her father; Ovarian cancer in her mother; Stroke in her brother. Social History:  Patient  reports that she has been smoking cigarettes. She has a 14.40 pack-year smoking history. She has never used smokeless tobacco. She reports current alcohol use of about 4.0 standard drinks per  week. She reports that she does not use drugs.  Review of Systems: Constitutional: Negative for fever malaise or anorexia Cardiovascular: negative for chest pain Respiratory: negative for SOB or persistent cough Gastrointestinal: negative for abdominal pain  Objective  Vitals: BP 116/78   Pulse 63   Temp 100.1 F (37.8 C) (Temporal)   Ht 5\' 7"  (1.702 m)   Wt 182 lb 3.2 oz (82.6 kg)   LMP  (LMP Unknown)   SpO2 95%   BMI 28.54 kg/m  General: no acute distress , A&Ox3, nontoxic-appearing HEENT: PEERL, conjunctiva normal, neck is supple Cardiovascular:  RRR without murmur or gallop.  Respiratory:  Good breath sounds  bilaterally, CTAB with normal respiratory effort Abdomen: Soft, normal bowel sounds, right upper quadrant tenderness without rebound guarding or masses.  No CVA tenderness, no left upper quadrant tenderness.  No suprapubic tenderness Skin:  Warm, no rashes    Commons side effects, risks, benefits, and alternatives for medications and treatment plan prescribed today were discussed, and the patient expressed understanding of the given instructions. Patient is instructed to call or message via MyChart if he/she has any questions or concerns regarding our treatment plan. No barriers to understanding were identified. We discussed Red Flag symptoms and signs in detail. Patient expressed understanding regarding what to do in case of urgent or emergency type symptoms.  Medication list was reconciled, printed and provided to the patient in AVS. Patient instructions and summary information was reviewed with the patient as documented in the AVS. This note was prepared with assistance of Dragon voice recognition software. Occasional wrong-word or sound-a-like substitutions may have occurred due to the inherent limitations of voice recognition software  This visit occurred during the SARS-CoV-2 public health emergency.  Safety protocols were in place, including screening questions prior to  the visit, additional usage of staff PPE, and extensive cleaning of exam room while observing appropriate contact time as indicated for disinfecting solutions.

## 2021-10-04 ENCOUNTER — Other Ambulatory Visit: Payer: Medicare Other

## 2021-10-04 ENCOUNTER — Encounter: Payer: Self-pay | Admitting: Family Medicine

## 2021-10-04 DIAGNOSIS — K8020 Calculus of gallbladder without cholecystitis without obstruction: Secondary | ICD-10-CM | POA: Insufficient documentation

## 2021-10-04 LAB — URINALYSIS, ROUTINE W REFLEX MICROSCOPIC
Bilirubin Urine: NEGATIVE
Ketones, ur: NEGATIVE
Leukocytes,Ua: NEGATIVE
Nitrite: NEGATIVE
Specific Gravity, Urine: <=1.005 — AB (ref 1.000–1.030)
Total Protein, Urine: 30 — AB
Urine Glucose: NEGATIVE
Urobilinogen, UA: 0.2 (ref 0.0–1.0)
WBC, UA: NONE SEEN (ref 0–?)
pH: 6 (ref 5.0–8.0)

## 2021-10-04 NOTE — Progress Notes (Signed)
Please call patient: I have reviewed his/her lab results. Labs show low sodium but otherwise are ok. Needs to keep hydrated and I will recheck in 2 weeks. Please schedule f/u appt.  Suspect GI bug. Urine looks ok. Has gallstones on ultrasound but no gallbladder infection. Let us know if worsening sxs or persistent or worsening fevers.  Thanks. To hospital for confusion ( a sign of low sodium in the blood).

## 2021-10-05 LAB — URINE CULTURE
MICRO NUMBER:: 12614884
Result:: NO GROWTH
SPECIMEN QUALITY:: ADEQUATE

## 2021-10-17 ENCOUNTER — Telehealth: Payer: Self-pay | Admitting: Pharmacist

## 2021-10-17 NOTE — Chronic Care Management (AMB) (Signed)
Chronic Care Management Pharmacy Assistant   Name: Jessica Curry  MRN: [id number] DOB: Apr 25, 1951   Reason for Encounter: Hypertension Adherence Call    Recent office visits:  10/03/2021 OV (PCP) Leamon Arnt, MD; no medication changes indicated.  09/25/2021 OV (PCP) Leamon Arnt, MD; Urine shows infection. Please order septra DS 1 po bid x 5 days #10  Recent consult visits:  None  Hospital visits:  None in previous 6 months  Medications: Outpatient Encounter Medications as of 10/17/2021  Medication Sig Note   albuterol (PROAIR HFA) 108 (90 Base) MCG/ACT inhaler Inhale 2 puffs into the lungs every 6 (six) hours as needed for wheezing or shortness of breath.    ALPRAZolam (XANAX) 0.5 MG tablet TAKE ONE TABLET BY MOUTH DAILY AS NEEDED FOR ANXIETY    amLODipine (NORVASC) 5 MG tablet Take 1 tablet (5 mg total) by mouth daily.    aspirin 81 MG tablet Take 81 mg by mouth at bedtime.    atorvastatin (LIPITOR) 20 MG tablet Take 20 mg by mouth daily.    cloNIDine (CATAPRES) 0.1 MG tablet TAKE 1 TABLET BY MOUTH 3  TIMES DAILY    COVID-19 mRNA bivalent vaccine, Pfizer, (PFIZER COVID-19 VAC BIVALENT) injection Inject into the muscle.    diphenhydrAMINE (BENADRYL) 25 MG tablet Take 25 mg by mouth every 6 (six) hours as needed for allergies or sleep.    fluocinonide (LIDEX) 0.05 % external solution Apply 1 application topically daily.    lisinopril (ZESTRIL) 40 MG tablet Take 1 tablet (40 mg total) by mouth daily.    meloxicam (MOBIC) 15 MG tablet Take 1 tablet (15 mg total) by mouth daily.    metFORMIN (GLUCOPHAGE) 1000 MG tablet TAKE 1 TABLET BY MOUTH  TWICE DAILY WITH MEALS    Multiple Vitamins-Minerals (MULTIVITAMIN WITH MINERALS) tablet Take 1 tablet by mouth daily. Centrum silver for women    nitroGLYCERIN (NITROSTAT) 0.4 MG SL tablet Place 1 tablet (0.4 mg total) under the tongue every 5 (five) minutes as needed for chest pain. 05/10/2021: Needs refill   promethazine  (PHENERGAN) 25 MG tablet Take 1 tablet (25 mg total) by mouth every 6 (six) hours as needed for nausea or vomiting.    sulfamethoxazole-trimethoprim (BACTRIM DS) 800-160 MG tablet Take 1 tablet by mouth 2 (two) times daily.    venlafaxine XR (EFFEXOR-XR) 75 MG 24 hr capsule TAKE THREE CAPSULES BY MOUTH DAILY WITH BREAKFAST    No facility-administered encounter medications on file as of 10/17/2021.   Reviewed chart prior to disease state call. Spoke with patient regarding BP  Recent Office Vitals: BP Readings from Last 3 Encounters:  10/03/21 116/78  09/25/21 (!) 156/88  08/28/21 118/80   Pulse Readings from Last 3 Encounters:  10/03/21 63  09/25/21 60  08/28/21 60    Wt Readings from Last 3 Encounters:  10/03/21 182 lb 3.2 oz (82.6 kg)  09/25/21 186 lb 3.2 oz (84.5 kg)  08/28/21 185 lb (83.9 kg)     Kidney Function Lab Results  Component Value Date/Time   CREATININE 1.40 (H) 10/03/2021 11:08 AM   CREATININE 1.00 05/26/2021 10:10 AM   CREATININE 1.15 (H) 02/01/2021 03:35 PM   CREATININE 1.00 (H) 09/15/2020 10:25 AM   GFR 38.19 (L) 10/03/2021 11:08 AM   GFRNONAA 55 (L) 05/12/2021 05:10 AM   GFRNONAA 49 (L) 02/01/2021 03:35 PM   GFRAA 56 (L) 02/01/2021 03:35 PM    BMP Latest Ref Rng & Units 10/03/2021 05/26/2021 05/12/2021  Glucose 70 - 99 mg/dL 120(H) 99 101(H)  BUN 6 - 23 mg/dL 18 15 12   Creatinine 0.40 - 1.20 mg/dL 1.40(H) 1.00 1.09(H)  BUN/Creat Ratio 6 - 22 (calc) - - -  Sodium 135 - 145 mEq/L 126(L) 132(L) 129(L)  Potassium 3.5 - 5.1 mEq/L 4.8 4.9 3.0(L)  Chloride 96 - 112 mEq/L 93(L) 99 95(L)  CO2 19 - 32 mEq/L 24 27 25   Calcium 8.4 - 10.5 mg/dL 10.2 10.1 9.1    Current antihypertensive regimen:  Amlodipine 5 mg daily Lisinopril 40 mg daily Clonidine 0.1 mg three times daily  How often are you checking your Blood Pressure? infrequently  Current home BP readings: 120's/80's  What recent interventions/DTPs have been made by any provider to improve Blood  Pressure control since last CPP Visit: No recent interventions or DTPs.  Any recent hospitalizations or ED visits since last visit with CPP? No  What diet changes have been made to improve Blood Pressure Control?  Patient states she eats a healthy diet.  What exercise is being done to improve your Blood Pressure Control?  Patient states she is not exercising at this time.  Adherence Review: Is the patient currently on ACE/ARB medication? Yes Does the patient have >5 day gap between last estimated fill dates? No   Care Gaps: Medicare Annual Wellness: Completed Ophthalmology Exam: Next due on 03/06/2022 Foot Exam: Next due on 12/26/2021 Hemoglobin A1C: 6.6% on 05/10/2021 Colonoscopy: Next due on 08/13/2023 Dexa Scan: Next due on 08/25/2022 Mammogram: Overdue since 08/25/2021  Future Appointments  Date Time Provider Rankin  10/23/2021  4:00 PM Leamon Arnt, MD [facility]  12/29/2021  9:00 AM Leamon Arnt, MD [facility]  06/01/2022  8:45 AM [facility] HEALTH COACH [facility]    Star Rating Drugs: Atorvastatin 20 mg last filled 06/15/2021 90 DS - patient states she had some extra on hand Lisinopril 40 mg last filled 07/03/2021 90 DS  April D Calhoun, [place] Pharmacist Assistant [phone]

## 2021-10-18 ENCOUNTER — Telehealth: Payer: Medicare Other

## 2021-10-23 ENCOUNTER — Ambulatory Visit (INDEPENDENT_AMBULATORY_CARE_PROVIDER_SITE_OTHER): Payer: Medicare Other | Admitting: Family Medicine

## 2021-10-23 ENCOUNTER — Encounter: Payer: Self-pay | Admitting: Family Medicine

## 2021-10-23 ENCOUNTER — Other Ambulatory Visit: Payer: Self-pay

## 2021-10-23 VITALS — BP 140/86 | HR 55 | Temp 98.3°F | Ht 67.0 in | Wt 187.4 lb

## 2021-10-23 DIAGNOSIS — E871 Hypo-osmolality and hyponatremia: Secondary | ICD-10-CM | POA: Diagnosis not present

## 2021-10-23 DIAGNOSIS — K529 Noninfective gastroenteritis and colitis, unspecified: Secondary | ICD-10-CM | POA: Diagnosis not present

## 2021-10-23 DIAGNOSIS — E860 Dehydration: Secondary | ICD-10-CM

## 2021-10-23 NOTE — Progress Notes (Signed)
Subjective  CC:  Chief Complaint  Patient presents with   Urinary Tract Infection    No symptoms, but still appears cloudy    HPI: Jessica Curry is a 70 y.o. female who presents to the office today to address the problems listed above in the chief complaint. Short-term follow-up for hyponatremia.  She was seen 2 weeks ago with GI symptoms.  Blood work showed hyponatremia and mild dehydration with mild acute renal sufficiency.  Right upper quadrant ultrasound was negative for acute cholecystitis.  She was having some loose stools.  Fortunately, she reported her symptoms resolved over the next several days after being seen.  She is now eating and drinking normally.  Bowel movements are normal.  No longer having abdominal pain, malaise or fever.  History of recurrent UTIs with cloudy urine but no urinary symptoms.  Assessment  1. Chronic hyponatremia   2. Dehydration   3. Gastroenteritis      Plan  Chronic hyponatremia: Worse due to dehydration from likely gastroenteritis.  We will recheck BMP today.  Clinically has resolved.  Follow up: As scheduled 12/29/2021  Orders Placed This Encounter  Procedures   Basic metabolic panel   No orders of the defined types were placed in this encounter.     I reviewed the patients updated PMH, FH, and SocHx.    Patient Active Problem List   Diagnosis Date Noted   History of heart artery stent 02/10/2020    Priority: High   Simple chronic bronchitis ([place]) 02/11/2018    Priority: High   Hyperlipidemia associated with type 2 diabetes mellitus ([place]) 06/04/2017    Priority: High   Type 2 diabetes mellitus with nephropathy ([place]) 09/10/2016    Priority: High   Hypertension associated with diabetes ([place]) 09/10/2016    Priority: High   Nicotine dependence, cigarettes, w unsp disorders 09/10/2016    Priority: High   Coronary artery disease of native artery of native heart with stable angina pectoris ([place])     Priority: High   Depression,  major, recurrent, in partial remission ([place])     Priority: High   Cholelithiasis 10/04/2021    Priority: Medium    Claudication ([place]) 08/28/2021    Priority: Medium    History of sepsis 05/11/2021    Priority: Medium    Chronic kidney disease, stage 3a ([place]) 02/07/2021    Priority: Medium    Chronic hyponatremia 02/07/2021    Priority: Medium    Bilateral renal cysts 02/06/2021    Priority: Medium    Hyponatremia 01/02/2021    Priority: Medium    Chronic midline low back pain without sciatica 12/26/2020    Priority: Medium    Serrated polyp of colon 08/12/2018    Priority: Medium    Osteopenia 06/10/2017    Priority: Medium    Encounter for screening for lung cancer 02/10/2020    Priority: Low   Mild intermittent asthma 08/06/2019    Priority: Low   Alcohol use 02/07/2021    Priority: 3.   Current Meds  Medication Sig   albuterol (PROAIR HFA) 108 (90 Base) MCG/ACT inhaler Inhale 2 puffs into the lungs every 6 (six) hours as needed for wheezing or shortness of breath.   ALPRAZolam (XANAX) 0.5 MG tablet TAKE ONE TABLET BY MOUTH DAILY AS NEEDED FOR ANXIETY   amLODipine (NORVASC) 5 MG tablet Take 1 tablet (5 mg total) by mouth daily.   aspirin 81 MG tablet Take 81 mg by mouth at bedtime.  atorvastatin (LIPITOR) 20 MG tablet Take 20 mg by mouth daily.   cloNIDine (CATAPRES) 0.1 MG tablet TAKE 1 TABLET BY MOUTH 3  TIMES DAILY   diphenhydrAMINE (BENADRYL) 25 MG tablet Take 25 mg by mouth every 6 (six) hours as needed for allergies or sleep.   fluocinonide (LIDEX) 0.05 % external solution Apply 1 application topically daily.   lisinopril (ZESTRIL) 40 MG tablet Take 1 tablet (40 mg total) by mouth daily.   meloxicam (MOBIC) 15 MG tablet Take 1 tablet (15 mg total) by mouth daily.   metFORMIN (GLUCOPHAGE) 1000 MG tablet TAKE 1 TABLET BY MOUTH  TWICE DAILY WITH MEALS   Multiple Vitamins-Minerals (MULTIVITAMIN WITH MINERALS) tablet Take 1 tablet by mouth daily. Centrum silver for women    promethazine (PHENERGAN) 25 MG tablet Take 1 tablet (25 mg total) by mouth every 6 (six) hours as needed for nausea or vomiting.   venlafaxine XR (EFFEXOR-XR) 75 MG 24 hr capsule TAKE THREE CAPSULES BY MOUTH DAILY WITH BREAKFAST   [DISCONTINUED] COVID-19 mRNA bivalent vaccine, Pfizer, (PFIZER COVID-19 VAC BIVALENT) injection Inject into the muscle.    Allergies: Patient has No Known Allergies. Family History: Patient family history includes AAA (abdominal aortic aneurysm) in her mother; Arthritis in her brother and mother; Dementia in her maternal grandmother; Heart attack in her brother; Hyperlipidemia in her brother; Hypertension in her brother; Hypothyroidism in her mother; Kidney disease in her mother; Other in her father; Ovarian cancer in her mother; Stroke in her brother. Social History:  Patient  reports that she has been smoking cigarettes. She has a 14.40 pack-year smoking history. She has never used smokeless tobacco. She reports current alcohol use of about 4.0 standard drinks per week. She reports that she does not use drugs.  Review of Systems: Constitutional: Negative for fever malaise or anorexia Cardiovascular: negative for chest pain Respiratory: negative for SOB or persistent cough Gastrointestinal: negative for abdominal pain  Objective  Vitals: BP 140/86   Pulse (!) 55   Temp 98.3 F (36.8 C) (Temporal)   Ht 5\' 7"  (1.702 m)   Wt 187 lb 6.4 oz (85 kg)   LMP  (LMP Unknown)   SpO2 99%   BMI 29.35 kg/m  General: no acute distress , A&Ox3 HEENT: PEERL, conjunctiva normal, neck is supple Cardiovascular:  RRR without murmur or gallop.  Respiratory:  Good breath sounds bilaterally, CTAB with normal respiratory effort Skin:  Warm, no rashes  No visits with results within 1 Day(s) from this visit.  Latest known visit with results is:  Office Visit on 10/03/2021  Component Date Value Ref Range Status   WBC 10/03/2021 6.7  4.0 - 10.5 K/uL Final   RBC 10/03/2021  4.03  3.87 - 5.11 Mil/uL Final   Hemoglobin 10/03/2021 12.8  12.0 - 15.0 g/dL Final   HCT 10/03/2021 36.9  36.0 - 46.0 % Final   MCV 10/03/2021 91.4  78.0 - 100.0 fl Final   MCHC 10/03/2021 34.8  30.0 - 36.0 g/dL Final   RDW 10/03/2021 12.9  11.5 - 15.5 % Final   Platelets 10/03/2021 233.0  150.0 - 400.0 K/uL Final   Neutrophils Relative % 10/03/2021 87.8 Repeated and verified X2. (H)  43.0 - 77.0 % Final   Lymphocytes Relative 10/03/2021 5.8 (L)  12.0 - 46.0 % Final   Monocytes Relative 10/03/2021 5.6  3.0 - 12.0 % Final   Eosinophils Relative 10/03/2021 0.4  0.0 - 5.0 % Final   Basophils Relative 10/03/2021 0.4  0.0 - 3.0 % Final   Neutro Abs 10/03/2021 5.9  1.4 - 7.7 K/uL Final   Lymphs Abs 10/03/2021 0.4 (L)  0.7 - 4.0 K/uL Final   Monocytes Absolute 10/03/2021 0.4  0.1 - 1.0 K/uL Final   Eosinophils Absolute 10/03/2021 0.0  0.0 - 0.7 K/uL Final   Basophils Absolute 10/03/2021 0.0  0.0 - 0.1 K/uL Final   Sodium 10/03/2021 126 (L)  135 - 145 mEq/L Final   Potassium 10/03/2021 4.8  3.5 - 5.1 mEq/L Final   Chloride 10/03/2021 93 (L)  96 - 112 mEq/L Final   CO2 10/03/2021 24  19 - 32 mEq/L Final   Glucose, Bld 10/03/2021 120 (H)  70 - 99 mg/dL Final   BUN 10/03/2021 18  6 - 23 mg/dL Final   Creatinine, Ser 10/03/2021 1.40 (H)  0.40 - 1.20 mg/dL Final   Total Bilirubin 10/03/2021 0.6  0.2 - 1.2 mg/dL Final   Alkaline Phosphatase 10/03/2021 115  39 - 117 U/L Final   AST 10/03/2021 22  0 - 37 U/L Final   ALT 10/03/2021 21  0 - 35 U/L Final   Total Protein 10/03/2021 6.4  6.0 - 8.3 g/dL Final   Albumin 10/03/2021 3.8  3.5 - 5.2 g/dL Final   GFR 10/03/2021 38.19 (L)  >60.00 mL/min Final   Calcium 10/03/2021 10.2  8.4 - 10.5 mg/dL Final   Lipase 10/03/2021 31.0  11.0 - 59.0 U/L Final   Color, Urine 10/03/2021 YELLOW  Yellow;Lt. Yellow;Straw;Dark Yellow;Amber;Green;Red;Brown Final   APPearance 10/03/2021 CLEAR  Clear;Turbid;Slightly Cloudy;Cloudy Final   Specific Gravity, Urine 10/03/2021  <=1.005 (A)  1.000 - 1.030 Final   pH 10/03/2021 6.0  5.0 - 8.0 Final   Total Protein, Urine 10/03/2021 30 (A)  Negative Final   Urine Glucose 10/03/2021 NEGATIVE  Negative Final   Ketones, ur 10/03/2021 NEGATIVE  Negative Final   Bilirubin Urine 10/03/2021 NEGATIVE  Negative Final   Hgb urine dipstick 10/03/2021 TRACE-INTACT (A)  Negative Final   Urobilinogen, UA 10/03/2021 0.2  0.0 - 1.0 Final   Leukocytes,Ua 10/03/2021 NEGATIVE  Negative Final   Nitrite 10/03/2021 NEGATIVE  Negative Final   WBC, UA 10/03/2021 none seen  0-2/hpf Final   RBC / HPF 10/03/2021 0-2/hpf  0-2/hpf Final   Squamous Epithelial / LPF 10/03/2021 Rare(0-4/hpf)  Rare(0-4/hpf) Final   MICRO NUMBER: 10/03/2021 [id number]   Final   SPECIMEN QUALITY: 10/03/2021 Adequate   Final   Sample Source 10/03/2021 NOT GIVEN   Final   STATUS: 10/03/2021 FINAL   Final   Result: 10/03/2021 No Growth   Final   Lab Results  Component Value Date   CREATININE 1.40 (H) 10/03/2021   BUN 18 10/03/2021   NA 126 (L) 10/03/2021   K 4.8 10/03/2021   CL 93 (L) 10/03/2021   CO2 24 10/03/2021   Lab Results  Component Value Date   WBC 6.7 10/03/2021   HGB 12.8 10/03/2021   HCT 36.9 10/03/2021   MCV 91.4 10/03/2021   PLT 233.0 10/03/2021      Commons side effects, risks, benefits, and alternatives for medications and treatment plan prescribed today were discussed, and the patient expressed understanding of the given instructions. Patient is instructed to call or message via MyChart if he/she has any questions or concerns regarding our treatment plan. No barriers to understanding were identified. We discussed Red Flag symptoms and signs in detail. Patient expressed understanding regarding what to do in case of urgent or emergency type symptoms.  Medication list was reconciled, printed and provided to the patient in AVS. Patient instructions and summary information was reviewed with the patient as documented in the AVS. This note was  prepared with assistance of Dragon voice recognition software. Occasional wrong-word or sound-a-like substitutions may have occurred due to the inherent limitations of voice recognition software  This visit occurred during the SARS-CoV-2 public health emergency.  Safety protocols were in place, including screening questions prior to the visit, additional usage of staff PPE, and extensive cleaning of exam room while observing appropriate contact time as indicated for disinfecting solutions.

## 2021-10-23 NOTE — Patient Instructions (Signed)
Please follow up as scheduled for your next visit with me: 12/29/2021   If you have any questions or concerns, please don't hesitate to send me a message via MyChart or call the office at [phone]. Thank you for visiting with [place] today! It's our pleasure caring for you.

## 2021-10-24 LAB — BASIC METABOLIC PANEL
BUN: 25 mg/dL — ABNORMAL HIGH (ref 6–23)
CO2: 25 mEq/L (ref 19–32)
Calcium: 9.9 mg/dL (ref 8.4–10.5)
Chloride: 103 mEq/L (ref 96–112)
Creatinine, Ser: 1.44 mg/dL — ABNORMAL HIGH (ref 0.40–1.20)
GFR: 36.9 mL/min — ABNORMAL LOW (ref >60.00)
Glucose, Bld: 73 mg/dL (ref 70–99)
Potassium: 5.4 mEq/L — ABNORMAL HIGH (ref 3.5–5.1)
Sodium: 133 mEq/L — ABNORMAL LOW (ref 135–145)

## 2021-10-25 NOTE — Progress Notes (Signed)
Please call patient: I have reviewed his/her lab results. Sodium and kidney function have improved again. Potassium is now a little high. Ensure not taking any otc supplements, drink plenty of water, hold lisinopril for 2 days.   12/29/2021

## 2021-10-27 ENCOUNTER — Telehealth: Payer: Self-pay

## 2021-10-27 NOTE — Telephone Encounter (Signed)
Patient is returning a call from Kai[place].  

## 2021-10-27 NOTE — Telephone Encounter (Signed)
Spoke with patient.

## 2021-10-31 ENCOUNTER — Telehealth: Payer: Self-pay

## 2021-10-31 ENCOUNTER — Other Ambulatory Visit: Payer: Self-pay | Admitting: *Deleted

## 2021-10-31 DIAGNOSIS — F17210 Nicotine dependence, cigarettes, uncomplicated: Secondary | ICD-10-CM

## 2021-10-31 DIAGNOSIS — Z87891 Personal history of nicotine dependence: Secondary | ICD-10-CM

## 2021-10-31 NOTE — Telephone Encounter (Signed)
Pt called wanting a call back. Pt stated that her sister has gotten the new covid booster and Ebony Hail would like to discuss getting it. Please advise. Marland Kitchen

## 2021-11-01 NOTE — Telephone Encounter (Signed)
Spoke with patient, gave a verbal understanding. Will update [place] with the date if she decides to receive vaccine

## 2021-11-03 ENCOUNTER — Telehealth: Payer: Self-pay

## 2021-11-03 ENCOUNTER — Other Ambulatory Visit: Payer: Self-pay

## 2021-11-03 MED ORDER — METFORMIN HCL 1000 MG PO TABS: ORAL_TABLET | ORAL | 3 refills | Status: DC

## 2021-11-03 NOTE — Telephone Encounter (Signed)
LAST APPOINTMENT DATE:  10/23/21  NEXT APPOINTMENT DATE: 12/29/21  MEDICATION:metFORMIN (GLUCOPHAGE) 1000 MG tablet  PHARMACY: [facility], [place]

## 2021-11-03 NOTE — Telephone Encounter (Signed)
Has been filled.

## 2021-11-08 DIAGNOSIS — H268 Other specified cataract: Secondary | ICD-10-CM | POA: Diagnosis not present

## 2021-11-08 DIAGNOSIS — H2511 Age-related nuclear cataract, right eye: Secondary | ICD-10-CM | POA: Diagnosis not present

## 2021-11-23 ENCOUNTER — Ambulatory Visit (INDEPENDENT_AMBULATORY_CARE_PROVIDER_SITE_OTHER)
Admission: RE | Admit: 2021-11-23 | Discharge: 2021-11-23 | Disposition: A | Discharge status: Home / Self Care | Payer: Medicare Other | Source: Ambulatory Visit | Attending: Cardiology | Admitting: Cardiology

## 2021-11-23 ENCOUNTER — Telehealth: Payer: Self-pay | Admitting: Family Medicine

## 2021-11-23 ENCOUNTER — Other Ambulatory Visit: Payer: Self-pay

## 2021-11-23 DIAGNOSIS — Z87891 Personal history of nicotine dependence: Secondary | ICD-10-CM

## 2021-11-23 DIAGNOSIS — F17210 Nicotine dependence, cigarettes, uncomplicated: Secondary | ICD-10-CM | POA: Diagnosis not present

## 2021-11-23 NOTE — Telephone Encounter (Signed)
Pharmacy for refill: [facility]  atorvastatin (LIPITOR) 20 MG tablet [[id number]]    cloNIDine (CATAPRES) 0.1 MG tablet [[id number]]

## 2021-11-24 ENCOUNTER — Telehealth: Payer: Self-pay | Admitting: Acute Care

## 2021-11-24 ENCOUNTER — Ambulatory Visit (INDEPENDENT_AMBULATORY_CARE_PROVIDER_SITE_OTHER): Payer: Medicare Other | Admitting: Family Medicine

## 2021-11-24 ENCOUNTER — Encounter: Payer: Self-pay | Admitting: Family Medicine

## 2021-11-24 VITALS — BP 177/96 | HR 77 | Temp 98.4°F | Ht 67.0 in | Wt 184.2 lb

## 2021-11-24 DIAGNOSIS — F17210 Nicotine dependence, cigarettes, uncomplicated: Secondary | ICD-10-CM

## 2021-11-24 DIAGNOSIS — E1159 Type 2 diabetes mellitus with other circulatory complications: Secondary | ICD-10-CM | POA: Diagnosis not present

## 2021-11-24 DIAGNOSIS — I152 Hypertension secondary to endocrine disorders: Secondary | ICD-10-CM

## 2021-11-24 DIAGNOSIS — J189 Pneumonia, unspecified organism: Secondary | ICD-10-CM

## 2021-11-24 DIAGNOSIS — Z87891 Personal history of nicotine dependence: Secondary | ICD-10-CM

## 2021-11-24 MED ORDER — AZITHROMYCIN 250 MG PO TABS: ORAL_TABLET | ORAL | 0 refills | Status: DC

## 2021-11-24 MED ORDER — CLONIDINE HCL 0.1 MG PO TABS: 0.1000 mg | ORAL_TABLET | Freq: Three times a day (TID) | ORAL | 3 refills | Status: DC

## 2021-11-24 MED ORDER — ATORVASTATIN CALCIUM 20 MG PO TABS: 20.0000 mg | ORAL_TABLET | Freq: Every day | ORAL | 3 refills | Status: DC

## 2021-11-24 MED ORDER — CEFDINIR 300 MG PO CAPS: 300.0000 mg | ORAL_CAPSULE | Freq: Two times a day (BID) | ORAL | 0 refills | Status: AC

## 2021-11-24 NOTE — Telephone Encounter (Signed)
This is an FYI for the provider.  °

## 2021-11-24 NOTE — Addendum Note (Signed)
Addended by: Karren Cobble on: 11/24/2021 09:45 AM   Modules accepted: Orders

## 2021-11-24 NOTE — Telephone Encounter (Signed)
I have called the patient with the results of her low dose Ct. Her scan was read as a LR 0 as there is a  New dense right middle lobe consolidation favoring pneumonia.The patient confirms that she has felt bad for the last several weeks. She has been seen by her PCP . I have advised the patient to go to an urgent care for treatment of her pneumonia as our office is closed until Tuesday for the holiday. I also gave her the option of calling her PCP for care. She verbalized understanding of the above.  Per the recommendation for 3 month follow up scan to re-evaluate, we will place an order for a 3 month follow up low dose CT Chest . I told patient we will call her closer to the time to schedule. She is in agreement with this plan. Langley Gauss, please place order for 3 month follow up and fax PCP results . Thanks so much

## 2021-11-24 NOTE — Patient Instructions (Signed)
It was very nice to see you today!  Please start cefdinir and azithromycin.  Be sure that you are getting plenty of fluids.  Let us know if not improving by next week.  Take care, Dr Jerline Pain  PLEASE NOTE:  If you had any lab tests please let us know if you have not heard back within a few days. You may see your results on mychart before we have a chance to review them but we will give you a call once they are reviewed by [place]. If we ordered any referrals today, please let us know if you have not heard from their office within the next week.   Please try these tips to maintain a healthy lifestyle:  Eat at least 3 REAL meals and 1-2 snacks per day.  Aim for no more than 5 hours between eating.  If you eat breakfast, please do so within one hour of getting up.   Each meal should contain half fruits/vegetables, one quarter protein, and one quarter carbs (no bigger than a computer mouse)  Cut down on sweet beverages. This includes juice, soda, and sweet tea.   Drink at least 1 glass of water with each meal and aim for at least 8 glasses per day  Exercise at least 150 minutes every week.

## 2021-11-24 NOTE — Progress Notes (Signed)
° °  Jessica Curry is a 70 y.o. female who presents today for an office visit.  Assessment/Plan:  New/Acute Problems: Community Acquired Pneumonia Incidentally found on CT scan yesterday.  She appears relatively well today without any obvious signs of systemic illness however she does have some exam findings and other symptoms consistent with respiratory infection.  We will treat as community-acquired pneumonia with course of azithromycin and Omnicef.  Encourage good oral hydration.  She can continue her home medications for her COPD/asthma.  We discussed starting prednisone at this point however she deferred.  Discussed reasons return to care and seek emergent care.  She will follow-up with [place] if not improving.  Chronic Problems Addressed Today: COPD / Asthma Continue home medications.   Essential hypertension Elevated today though she has been out of meds for the last few days.  Her PCP refilled meds today.  She will go to the pharmacy to pick these up.  She will let us know if persistently elevated once restarting her home meds.     Subjective:  HPI:  Patient here with concerns for pneumonia.  This was incidentally found on a lung cancer screening CT scan that she had done yesterday.  She was called with results today.  Told she had pneumonia and that she should seek treatment.  Her PCP is not available.  Over the last several weeks she has noticed increased malaise and fatigue.  Occasional cough.  Occasional low-grade fevers.  She has been very busy over the holiday season initially thought this was the source of her fatigue and malaise.  She is also been concerned about bronchitis for the last several weeks.  Cough is nonproductive.  No chest pain.  No shortness of breath.  She has been out of her blood pressure medication for the last few days.         Objective:  Physical Exam: Temp 98.4 F (36.9 C)    Ht 5\' 7"  (1.702 m)    Wt 184 lb 3.2 oz (83.6 kg)    LMP  (LMP Unknown)     BMI 28.85 kg/m   Gen: No acute distress, resting comfortably CV: Regular rate and rhythm with no murmurs appreciated Pulm: Normal work of breathing.  Speaking in full sentences.  Rhonchi noted in right lung fields, otherwise clear to auscultation. Neuro: Grossly normal, moves all extremities Psych: Normal affect and thought content      Genavive Kubicki M. Jerline Pain, MD 11/24/2021 3:01 PM

## 2021-11-24 NOTE — Telephone Encounter (Signed)
Refills sent to pharmacy. 

## 2021-11-26 ENCOUNTER — Other Ambulatory Visit: Payer: Self-pay | Admitting: Family Medicine

## 2021-11-28 NOTE — Addendum Note (Signed)
Addended by: Doroteo Glassman D on: 11/28/2021 07:57 AM   Modules accepted: Orders

## 2021-11-28 NOTE — Telephone Encounter (Signed)
Order for 3 mth ct chest nodule f/u has been placed. Pt will be called closer to that time to schedule.

## 2021-11-30 NOTE — Telephone Encounter (Signed)
Spoke with patient, she was seen by Dr.Parker and given 2 antibiotics. She feels a little better, will follow up with [place] if needed. Is scheduled to have a repeat scan in 3 months.

## 2021-11-30 NOTE — Telephone Encounter (Signed)
Left voicemail for patient to return call.

## 2021-12-11 ENCOUNTER — Other Ambulatory Visit: Payer: Self-pay

## 2021-12-11 ENCOUNTER — Ambulatory Visit: Payer: Medicare Other

## 2021-12-29 ENCOUNTER — Encounter: Payer: Self-pay | Admitting: Family Medicine

## 2021-12-29 ENCOUNTER — Other Ambulatory Visit: Payer: Self-pay

## 2021-12-29 ENCOUNTER — Ambulatory Visit (INDEPENDENT_AMBULATORY_CARE_PROVIDER_SITE_OTHER): Payer: Medicare Other | Admitting: Family Medicine

## 2021-12-29 VITALS — BP 130/80 | HR 61 | Temp 98.2°F | Ht 67.0 in | Wt 187.0 lb

## 2021-12-29 DIAGNOSIS — I152 Hypertension secondary to endocrine disorders: Secondary | ICD-10-CM

## 2021-12-29 DIAGNOSIS — Z1231 Encounter for screening mammogram for malignant neoplasm of breast: Secondary | ICD-10-CM | POA: Diagnosis not present

## 2021-12-29 DIAGNOSIS — E1159 Type 2 diabetes mellitus with other circulatory complications: Secondary | ICD-10-CM

## 2021-12-29 DIAGNOSIS — E785 Hyperlipidemia, unspecified: Secondary | ICD-10-CM | POA: Diagnosis not present

## 2021-12-29 DIAGNOSIS — F3341 Major depressive disorder, recurrent, in partial remission: Secondary | ICD-10-CM

## 2021-12-29 DIAGNOSIS — N281 Cyst of kidney, acquired: Secondary | ICD-10-CM | POA: Diagnosis not present

## 2021-12-29 DIAGNOSIS — E871 Hypo-osmolality and hyponatremia: Secondary | ICD-10-CM | POA: Diagnosis not present

## 2021-12-29 DIAGNOSIS — F17219 Nicotine dependence, cigarettes, with unspecified nicotine-induced disorders: Secondary | ICD-10-CM

## 2021-12-29 DIAGNOSIS — J410 Simple chronic bronchitis: Secondary | ICD-10-CM

## 2021-12-29 DIAGNOSIS — I739 Peripheral vascular disease, unspecified: Secondary | ICD-10-CM | POA: Diagnosis not present

## 2021-12-29 DIAGNOSIS — Z0000 Encounter for general adult medical examination without abnormal findings: Secondary | ICD-10-CM | POA: Diagnosis not present

## 2021-12-29 DIAGNOSIS — I10 Essential (primary) hypertension: Secondary | ICD-10-CM | POA: Diagnosis not present

## 2021-12-29 DIAGNOSIS — E1169 Type 2 diabetes mellitus with other specified complication: Secondary | ICD-10-CM

## 2021-12-29 DIAGNOSIS — N1831 Chronic kidney disease, stage 3a: Secondary | ICD-10-CM | POA: Diagnosis not present

## 2021-12-29 DIAGNOSIS — E1121 Type 2 diabetes mellitus with diabetic nephropathy: Secondary | ICD-10-CM

## 2021-12-29 DIAGNOSIS — I25118 Atherosclerotic heart disease of native coronary artery with other forms of angina pectoris: Secondary | ICD-10-CM

## 2021-12-29 LAB — COMPREHENSIVE METABOLIC PANEL
ALT: 12 U/L (ref 0–35)
AST: 15 U/L (ref 0–37)
Albumin: 4.1 g/dL (ref 3.5–5.2)
Alkaline Phosphatase: 85 U/L (ref 39–117)
BUN: 19 mg/dL (ref 6–23)
CO2: 29 mEq/L (ref 19–32)
Calcium: 10.7 mg/dL — ABNORMAL HIGH (ref 8.4–10.5)
Chloride: 101 mEq/L (ref 96–112)
Creatinine, Ser: 1.26 mg/dL — ABNORMAL HIGH (ref 0.40–1.20)
GFR: 43.26 mL/min — ABNORMAL LOW (ref >60.00)
Glucose, Bld: 124 mg/dL — ABNORMAL HIGH (ref 70–99)
Potassium: 4.5 mEq/L (ref 3.5–5.1)
Sodium: 136 mEq/L (ref 135–145)
Total Bilirubin: 0.5 mg/dL (ref 0.2–1.2)
Total Protein: 6.2 g/dL (ref 6.0–8.3)

## 2021-12-29 LAB — CBC WITH DIFFERENTIAL/PLATELET
Basophils Absolute: 0.1 10*3/uL (ref 0.0–0.1)
Basophils Relative: 1.2 % (ref 0.0–3.0)
Eosinophils Absolute: 0.2 10*3/uL (ref 0.0–0.7)
Eosinophils Relative: 3.7 % (ref 0.0–5.0)
HCT: 41.7 % (ref 36.0–46.0)
Hemoglobin: 14 g/dL (ref 12.0–15.0)
Lymphocytes Relative: 20.6 % (ref 12.0–46.0)
Lymphs Abs: 1.3 10*3/uL (ref 0.7–4.0)
MCHC: 33.6 g/dL (ref 30.0–36.0)
MCV: 91.9 fl (ref 78.0–100.0)
Monocytes Absolute: 0.4 10*3/uL (ref 0.1–1.0)
Monocytes Relative: 5.9 % (ref 3.0–12.0)
Neutro Abs: 4.4 10*3/uL (ref 1.4–7.7)
Neutrophils Relative %: 68.6 % (ref 43.0–77.0)
Platelets: 259 10*3/uL (ref 150.0–400.0)
RBC: 4.54 Mil/uL (ref 3.87–5.11)
RDW: 13.1 % (ref 11.5–15.5)
WBC: 6.4 10*3/uL (ref 4.0–10.5)

## 2021-12-29 LAB — LIPID PANEL
Cholesterol: 180 mg/dL (ref 0–200)
HDL: 47.6 mg/dL (ref >39.00)
NonHDL: 132.05
Total CHOL/HDL Ratio: 4
Triglycerides: 299 mg/dL — ABNORMAL HIGH (ref 0.0–149.0)
VLDL: 59.8 mg/dL — ABNORMAL HIGH (ref 0.0–40.0)

## 2021-12-29 LAB — TSH: TSH: 2.91 u[IU]/mL (ref 0.35–5.50)

## 2021-12-29 LAB — LDL CHOLESTEROL, DIRECT: Direct LDL: 94 mg/dL

## 2021-12-29 LAB — HEMOGLOBIN A1C: Hgb A1c MFr Bld: 6.2 % (ref 4.6–6.5)

## 2021-12-29 NOTE — Patient Instructions (Signed)
Please return in 3 months for diabetes follow up and metformin dosing.  I will release your lab results to you on your MyChart account with further instructions. Please reply with any questions.    If you have any questions or concerns, please don't hesitate to send me a message via MyChart or call the office at [phone]. Thank you for visiting with [place] today! It's our pleasure caring for you.   I have ordered a mammogram and/or bone density for you as we discussed today: [x]   Mammogram  []   Bone Density  Please call the office checked below to schedule your appointment:  [x]   [facility]      [address] [place], [place]         []   [facility]  [address] [place], [place]

## 2021-12-29 NOTE — Progress Notes (Signed)
Subjective  Chief Complaint  Patient presents with   Annual Exam    Fasting     HPI: Jessica Curry is a 71 y.o. female who presents to [facility] today for a Female Wellness Visit. She also has the concerns and/or needs as listed above in the chief complaint. These will be addressed in addition to the Health Maintenance Visit.   Wellness Visit: annual visit with health maintenance review and exam without Pap  Health maintenance: Overdue for mammogram.  Health screenings are current.  Immunizations are current.  Overall, she is doing very well today.  Does admit to some balance issues and would like to lose weight.  Chronic disease f/u and/or acute problem visit: (deemed necessary to be done in addition to the wellness visit): Multiple chronic medical problems including COPD, smoker down to 4 to 10 cigarettes daily but not wanting to quit, chronic major depression, hypertension, coronary artery disease with history of stenting, chronic kidney disease and hyponatremia here for follow-up.  Fortunately her chronic medical problems seem well controlled.  Mood is good.  No chest pain or shortness of breath.  Did have community-acquired pneumonia in December feels like she has recovered.  No lower extremity edema.  Has history of recurrent UTI and reports cloudy urine but no dysuria.  Reviewed medication list in detail. Endorses mild intermittent diarrhea.  Occasional crampy pain.  No blood in the stool.  She is on metformin 1000 twice daily.  She inquires about Ozempic  Assessment  1. Annual physical exam   2. Encounter for screening mammogram for malignant neoplasm of breast   3. Simple chronic bronchitis (HCC) Chronic  4. Depression, major, recurrent, in partial remission (HCC) Chronic  5. Hypertension associated with diabetes ([place]) Chronic  6. 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 Coronary artery disease of native artery of native heart with stable angina pectoris  (HCC) Chronic  8. Hyperlipidemia associated with type 2 diabetes mellitus ([place])   9. Nicotine dependence, cigarettes, w unsp disorders   10. Type 2 diabetes mellitus with nephropathy ([place])   11. Chronic hyponatremia   12. Chronic kidney disease, stage 3a ([place])   13. Bilateral renal cysts      Plan  Female Wellness Visit: Age appropriate Health Maintenance and Prevention measures were discussed with patient. Included topics are cancer screening recommendations, ways to keep healthy (see AVS) including dietary and exercise recommendations, regular eye and dental care, use of seat belts, and avoidance of moderate alcohol use and tobacco use.  Patient to schedule her mammogram BMI: discussed patient's BMI and encouraged positive lifestyle modifications to help get to or maintain a target BMI. HM needs and immunizations were addressed and ordered. See below for orders. See HM and immunization section for updates.  Current Routine labs and screening tests ordered including cmp, cbc and lipids where appropriate. Discussed recommendations regarding Vit D and calcium supplementation (see AVS)  Chronic disease management visit and/or acute problem visit: Diabetes type 2: Clinically doing fine.  Metformin 1000 twice daily may be contributing to intermittent diarrhea.  Will decrease to 500 twice daily and monitor.  Can consider changing to XR version if needed or to discuss possibility of Ozempic.  Would prefer SGLT2 inhibitor given chronic kidney disease.  We will discuss again in 3 months.  Recheck A1c. Hypertension is well controlled: She is on an ACE inhibitor.  Continue lisinopril 40 mg daily, clonidine 0.1 mg 3 times daily and amlodipine 5 mg daily.  Well-tolerated.  Check renal function electrolytes today. Hyperlipidemia tolerating atorvastatin 20 mg nightly.  Recheck fasting lipids today. Coronary disease: Stable without chest pain.  Continue aspirin 81 mg daily. COPD in a smoker: Continues to  slowly wean.  Occasional wheezing.  Mostly asymptomatic.  Lung cancer screening up-to-date. Bilateral renal cyst: Reviewed ultrasounds and abdominal pelvic CTs.  History of exophytic cyst which on follow-up ultrasounds.  Resolved.   Depression: Well-controlled on chronic Effexor high-dose daily. Recheck urine culture given history of recurrent UTI.  Patient will return for sample  Follow up: 3 months to recheck diabetes Orders Placed This Encounter  Procedures   Urine Culture   MM DIGITAL SCREENING BILATERAL   CBC with Differential/Platelet   Comprehensive metabolic panel   Lipid panel   Hemoglobin A1c   TSH   No orders of the defined types were placed in this encounter.     Body mass index is 29.29 kg/m. Wt Readings from Last 3 Encounters:  12/29/21 187 lb (84.8 kg)  11/24/21 184 lb 3.2 oz (83.6 kg)  10/23/21 187 lb 6.4 oz (85 kg)     Patient Active Problem List   Diagnosis Date Noted   History of heart artery stent 02/10/2020    Priority: High   Simple chronic bronchitis ([place]) 02/11/2018    Priority: High    SPIRO NML 01/2018     Hyperlipidemia associated with type 2 diabetes mellitus ([place]) 06/04/2017    Priority: High    Atorvastatin 20mg  MWF    Type 2 diabetes mellitus with nephropathy ([place]) 09/10/2016    Priority: High   Hypertension associated with diabetes ([place]) 09/10/2016    Priority: High   Nicotine dependence, cigarettes, w unsp disorders 09/10/2016    Priority: High    lung cancer screening- 3/4 PPD- since age 74 prior 2 PPD. 1 PPD. Cut down since heart attack.     Coronary artery disease of native artery of native heart with stable angina pectoris [facility])     Priority: High    [place].  Statin.  Dr. Marlou Porch now- previouslyNo cardiology since 2012 due to insurance issues.  Dr. Marijo File stent with [facility] in [place]. Cobalt chromium stent 06/05/2011- toPCI/bare metal to mid circumflex. Has not seen him since.     Depression, major, recurrent, in partial  remission ([place])     Priority: High    reasonable control on venlafaxine 75mg  TID. xanax #14 lasts about 2 years for anxiety portion.     Cholelithiasis 10/04/2021    Priority: Medium     RUQ ultrasound: biliary sludge and stones    Claudication ([place]) 08/28/2021    Priority: Medium    History of sepsis 05/11/2021    Priority: Medium     2022; unclear source    Chronic kidney disease, stage 3a ([place]) 02/07/2021    Priority: Medium    Chronic hyponatremia 02/07/2021    Priority: Medium    Bilateral renal cysts 02/06/2021    Priority: Medium     Renal ultrasound 4/20200 bilateral cysts; rec repeat in 6 months to recheck.     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    Serrated polyp of colon 08/12/2018    Priority: Medium     Sessile serrated  polyp 2019    Osteopenia 06/10/2017    Priority: Medium     Dexa: 07/2020 stable osteopenia, 06/10/17: osteopenia     Encounter for screening for lung cancer 02/10/2020    Priority: Low    Annual by chest CT; h/o benign appearing nodules.     Mild intermittent asthma 08/06/2019    Priority: Low   Health Maintenance  Topic Date Due   MAMMOGRAM  08/25/2021   HEMOGLOBIN A1C  11/09/2021   OPHTHALMOLOGY EXAM  03/06/2022   DEXA SCAN  08/25/2022   FOOT EXAM  12/29/2022   COLONOSCOPY (Pts 45-51yrs Insurance coverage will need to be confirmed)  08/13/2023   TETANUS/TDAP  12/05/2028   Pneumonia Vaccine 26+ Years old  Completed   INFLUENZA VACCINE  Completed   COVID-19 Vaccine  Completed   Zoster Vaccines- Shingrix  Completed   Hepatitis C Screening  Addressed   HPV VACCINES  Aged Out   Immunization History  Administered Date(s) Administered   Fluad Quad(high Dose 65+) 08/05/2019, 08/18/2020, 08/09/2021   Influenza, High Dose  Seasonal PF 08/20/2017, 08/06/2018   Influenza-Unspecified 09/24/2016   PFIZER Comirnaty(Gray Top)Covid-19 Tri-Sucrose Vaccine 04/10/2021   PFIZER(Purple Top)SARS-COV-2 Vaccination 12/15/2019, 01/05/2020   Pfizer Covid-19 Vaccine Bivalent Booster 81yrs & up 09/04/2021   Pneumococcal Conjugate-13 09/10/2016   Pneumococcal Polysaccharide-23 12/02/2017   Tdap 08/20/2018, 12/05/2018   Zoster Recombinat (Shingrix) 06/05/2017, 09/18/2017   We updated and reviewed the patient's past history in detail and it is documented below. Allergies: Patient has No Known Allergies. Past Medical History Patient  has a past medical history of Allergy, Asthma, Chicken pox, Depression, Diabetes mellitus, Genital warts, History of heart artery stent (02/10/2020), Hyperlipidemia, and Hypertension. Past Surgical History Patient  has a past surgical history that includes Tonsillectomy (1968); Coronary angioplasty with stent; and Dilation and curettage of uterus. Family History: Patient family history includes AAA (abdominal aortic aneurysm) in her mother; Arthritis in her brother and mother; Dementia in her maternal grandmother; Heart attack in her brother; Hyperlipidemia in her brother; Hypertension in her brother; Hypothyroidism in her mother; Kidney disease in her mother; Other in her father; Ovarian cancer in her mother; Stroke in her brother. Social History:  Patient  reports that she has been smoking cigarettes. She has a 14.40 pack-year smoking history. She has never used smokeless tobacco. She reports current alcohol use of about 4.0 standard drinks per week. She reports that she does not use drugs.  Review of Systems: Constitutional: negative for fever or malaise Ophthalmic: negative for photophobia, double vision or loss of vision Cardiovascular: negative for chest pain, dyspnea on exertion, or new LE swelling Respiratory: negative for SOB or persistent cough Gastrointestinal: negative for abdominal pain,  change in daily bowel habits or melena, positive for intermittent diarrhea Genitourinary: negative for dysuria or gross hematuria, no abnormal uterine bleeding or disharge Musculoskeletal: negative for new gait disturbance or muscular weakness Integumentary: negative for new or persistent rashes, no breast lumps Neurological: negative for TIA or stroke symptoms Psychiatric: negative for SI or delusions Allergic/Immunologic: negative for hives  Patient Care Team    Relationship Specialty Notifications Start End  Leamon Arnt, MD PCP - General Family Medicine  05/11/20   Jerline Pain, MD PCP - Cardiology Cardiology Admissions 03/27/18     Objective  Vitals: BP 130/80    Pulse 61    Temp 98.2 F (36.8 C) (Temporal)    Ht 5\' 7"  (1.702 m)    Wt 187 lb (84.8 kg)  LMP  (LMP Unknown)    SpO2 99%    BMI 29.29 kg/m  General:  Well developed, well nourished, no acute distress  Psych:  Alert and orientedx3,normal mood and affect HEENT:  Normocephalic, atraumatic, non-icteric sclera,  supple neck without adenopathy, mass or thyromegaly Cardiovascular:  Normal S1, S2, RRR without gallop, rub or murmur Respiratory:  Good breath sounds bilaterally, CTAB with normal respiratory effort Gastrointestinal: normal bowel sounds, soft, non-tender, no noted masses. No HSM MSK: no deformities, contusions. Joints are without erythema or swelling.  Skin:  Warm, no rashes or suspicious lesions noted Neurologic:    Mental status is normal. CN 2-11 are normal. Gross motor and sensory exams are normal. Normal gait. No tremor   Commons side effects, risks, benefits, and alternatives for medications and treatment plan prescribed today were discussed, and the patient expressed understanding of the given instructions. Patient is instructed to call or message via MyChart if he/she has any questions or concerns regarding our treatment plan. No barriers to understanding were identified. We discussed Red Flag symptoms and  signs in detail. Patient expressed understanding regarding what to do in case of urgent or emergency type symptoms.  Medication list was reconciled, printed and provided to the patient in AVS. Patient instructions and summary information was reviewed with the patient as documented in the AVS. This note was prepared with assistance of Dragon voice recognition software. Occasional wrong-word or sound-a-like substitutions may have occurred due to the inherent limitations of voice recognition software  This visit occurred during the SARS-CoV-2 public health emergency.  Safety protocols were in place, including screening questions prior to the visit, additional usage of staff PPE, and extensive cleaning of exam room while observing appropriate contact time as indicated for disinfecting solutions.

## 2021-12-31 NOTE — Progress Notes (Signed)
Please call patient: I have reviewed his/her lab results. Labs look good overall. Kidney function is stable. Will f/u on renal MRI (or could repeat ultrasound to take another look) with referral specialist. LISA: can you see if we can get the renal MRI to f/u exophytic renal cyst? If can't, the repeat renal ultrasound is recommended. Thanks  Diabetes looks great, ok to decrease dose of metformin as discussed.  Cholesterol remains higher than goal: please increase dose of atorvastatin to 40mg  nighlty (can order for patient).  Thyroid looks fine. Calcium is just above normal: will monitor.

## 2022-01-02 ENCOUNTER — Telehealth: Payer: Self-pay | Admitting: Family Medicine

## 2022-01-02 NOTE — Telephone Encounter (Signed)
Patient calling back.   °

## 2022-01-02 NOTE — Telephone Encounter (Signed)
Pt states she received a call yesterday from a CMA and was returning a call.

## 2022-01-02 NOTE — Telephone Encounter (Signed)
Return patient call. Call disconnected

## 2022-01-03 NOTE — Telephone Encounter (Signed)
Spoke with patient yesterday regarding her labs.

## 2022-01-04 NOTE — Progress Notes (Signed)
Spoke with pt, gave verbal understanding.

## 2022-02-05 ENCOUNTER — Other Ambulatory Visit: Payer: Self-pay | Admitting: Family Medicine

## 2022-02-05 ENCOUNTER — Telehealth: Payer: Self-pay | Admitting: Family Medicine

## 2022-02-05 NOTE — Telephone Encounter (Signed)
.. ?  Encourage patient to contact the pharmacy for refills or they can request refills through [facility] ? ?LAST APPOINTMENT DATE:  12/29/21 ? ?NEXT APPOINTMENT DATE: 03/29/22 ? ?MEDICATION:atorvastatin (LIPITOR) 20 MG tablet ? ?Is the patient out of medication? yes ? ?PHARMACY: ?[place], [address] Phone:  [phone]  ?Fax:  [phone]  ?  ? ? ?Let patient know to contact pharmacy at the end of the day to make sure medication is ready. ? ?Please notify patient to allow 48-72 hours to process  ?

## 2022-02-06 ENCOUNTER — Other Ambulatory Visit: Payer: Self-pay

## 2022-02-06 ENCOUNTER — Inpatient Hospital Stay: Admission: RE | Admit: 2022-02-06 | Payer: Medicare Other | Source: Ambulatory Visit

## 2022-02-06 DIAGNOSIS — E1169 Type 2 diabetes mellitus with other specified complication: Secondary | ICD-10-CM

## 2022-02-06 MED ORDER — ATORVASTATIN CALCIUM 20 MG PO TABS: 20.0000 mg | ORAL_TABLET | Freq: Every day | ORAL | 0 refills | Status: DC

## 2022-02-06 NOTE — Telephone Encounter (Signed)
Rx sent to [facility].  

## 2022-02-07 ENCOUNTER — Ambulatory Visit
Admission: RE | Admit: 2022-02-07 | Discharge: 2022-02-07 | Disposition: A | Discharge status: Home / Self Care | Payer: Medicare Other | Source: Ambulatory Visit | Attending: Family Medicine | Admitting: Family Medicine

## 2022-02-07 ENCOUNTER — Other Ambulatory Visit: Payer: Self-pay

## 2022-02-07 DIAGNOSIS — Z1231 Encounter for screening mammogram for malignant neoplasm of breast: Secondary | ICD-10-CM | POA: Diagnosis not present

## 2022-02-09 ENCOUNTER — Ambulatory Visit: Payer: Medicare Other | Admitting: Family Medicine

## 2022-02-12 ENCOUNTER — Ambulatory Visit (INDEPENDENT_AMBULATORY_CARE_PROVIDER_SITE_OTHER): Payer: Medicare Other | Admitting: Family Medicine

## 2022-02-12 ENCOUNTER — Encounter: Payer: Self-pay | Admitting: Family Medicine

## 2022-02-12 VITALS — BP 154/84 | HR 64 | Temp 98.5°F | Ht 67.0 in | Wt 192.4 lb

## 2022-02-12 DIAGNOSIS — R8290 Unspecified abnormal findings in urine: Secondary | ICD-10-CM

## 2022-02-12 DIAGNOSIS — F4322 Adjustment disorder with anxiety: Secondary | ICD-10-CM | POA: Diagnosis not present

## 2022-02-12 DIAGNOSIS — N3000 Acute cystitis without hematuria: Secondary | ICD-10-CM

## 2022-02-12 DIAGNOSIS — E785 Hyperlipidemia, unspecified: Secondary | ICD-10-CM | POA: Diagnosis not present

## 2022-02-12 DIAGNOSIS — E1169 Type 2 diabetes mellitus with other specified complication: Secondary | ICD-10-CM | POA: Diagnosis not present

## 2022-02-12 LAB — POCT URINALYSIS DIPSTICK
Blood, UA: POSITIVE
Glucose, UA: NEGATIVE
Ketones, UA: POSITIVE
Nitrite, UA: POSITIVE
Protein, UA: POSITIVE — AB
Spec Grav, UA: >=1.03 — AB (ref 1.010–1.025)
Urobilinogen, UA: 0.2 E.U./dL
pH, UA: 6 (ref 5.0–8.0)

## 2022-02-12 MED ORDER — ATORVASTATIN CALCIUM 20 MG PO TABS: 40.0000 mg | ORAL_TABLET | Freq: Every day | ORAL | 0 refills | Status: DC

## 2022-02-12 MED ORDER — METFORMIN HCL 1000 MG PO TABS: 500.0000 mg | ORAL_TABLET | Freq: Two times a day (BID) | ORAL | 3 refills | Status: DC

## 2022-02-12 MED ORDER — ATORVASTATIN CALCIUM 40 MG PO TABS: 40.0000 mg | ORAL_TABLET | Freq: Every evening | ORAL | 3 refills | Status: DC

## 2022-02-12 MED ORDER — ALPRAZOLAM 0.5 MG PO TABS: ORAL_TABLET | ORAL | 0 refills | Status: DC

## 2022-02-12 MED ORDER — CIPROFLOXACIN HCL 250 MG PO TABS: 250.0000 mg | ORAL_TABLET | Freq: Two times a day (BID) | ORAL | 0 refills | Status: AC

## 2022-02-12 NOTE — Patient Instructions (Signed)
Please follow up as scheduled for your next visit with me: 03/29/2022  ? ?Start the antibiotics and I will await the urine culture. ?Drink plenty of water and do not skip meals.  ?Let me know if you worsen any! ? ? ?If you have any questions or concerns, please don't hesitate to send me a message via MyChart or call the office at [phone]. Thank you for visiting with [place] today! It's our pleasure caring for you.  ?

## 2022-02-12 NOTE — Progress Notes (Signed)
? ?Subjective  ?CC:  ?Chief Complaint  ?Patient presents with  ? Leg Problem  ?  States that her legs are shaking uncontrollably. Started 2 weeks ago. She doesn't know if it is due to Atorvastatin dosage.   ? ? ?HPI: Jessica Curry is a 71 y.o. female who presents to the office today to address the problems listed above in the chief complaint. ?71 year old with history of recurrent UTI, history of sepsis last year with unknown source, presents due to feeling poorly over the last 2 weeks.  She reports difficulty standing, feels that legs are shaky and weak.  Has significant fatigue.  Has been sleeping throughout the day.  Appetite is fine.  However she admits to cloudy urine without dysuria.  No gross hematuria.  No chest pain, URI symptoms, cough.  She is stressed due to taking care of her elderly mother.  Mood is otherwise stable.  No fevers.  No lower extremity edema.  No rash ? ?Assessment  ?1. Acute cystitis without hematuria   ?2. Cloudy urine   ?3. Hyperlipidemia associated with type 2 diabetes mellitus ([place])   ?4. Adjustment disorder with anxiety   ? ?  ?Plan  ?Malaise and fatigue with cloudy urine: Suspect acute cystitis if had early Pyelo.  Await urine culture.  Start Cipro.  Hydrate.  Monitor closely.  Recheck 1 week.  To emergency room if worsens given her history of sepsis. ?Recurrent UTI: If culture positive, will consider antibiotic prophylaxis daily.  Discussed with patient ?Adjustment disorder with anxiety: She does request Xanax.  This is reasonable.  Discussed respite care ?Hyperlipidemia: Recommend Crestor 40 nightly.  Doubt current symptoms are related to atorvastatin ? ?Follow up: 7 to 10 days for recheck ?03/29/2022 ? ?Orders Placed This Encounter  ?Procedures  ? Urine Culture  ? Basic metabolic panel  ? CBC with Differential/Platelet  ? POCT urinalysis dipstick  ? ?Meds ordered this encounter  ?Medications  ? metFORMIN (GLUCOPHAGE) 1000 MG tablet  ?  Sig: Take 0.5 tablets (500 mg total) by  mouth 2 (two) times daily with a meal. TAKE 1 TABLET BY MOUTH  TWICE DAILY WITH MEALS  ?  Dispense:  180 tablet  ?  Refill:  3  ?  Requesting 1 year supply  ? DISCONTD: atorvastatin (LIPITOR) 20 MG tablet  ?  Sig: Take 2 tablets (40 mg total) by mouth daily.  ?  Dispense:  180 tablet  ?  Refill:  0  ?  PT SAYS SHE IS NOW TAKING 2 PER DAY. WE NEED NEW RX  ? atorvastatin (LIPITOR) 40 MG tablet  ?  Sig: Take 1 tablet (40 mg total) by mouth at bedtime.  ?  Dispense:  90 tablet  ?  Refill:  3  ? ciprofloxacin (CIPRO) 250 MG tablet  ?  Sig: Take 1 tablet (250 mg total) by mouth 2 (two) times daily for 7 days.  ?  Dispense:  14 tablet  ?  Refill:  0  ? ALPRAZolam (XANAX) 0.5 MG tablet  ?  Sig: TAKE ONE TABLET BY MOUTH DAILY AS NEEDED FOR ANXIETY  ?  Dispense:  30 tablet  ?  Refill:  0  ? ?  ? ?I reviewed the patients updated PMH, FH, and SocHx.  ?  ?Patient Active Problem List  ? Diagnosis Date Noted  ? 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  Priority: High  ? Simple chronic bronchitis ([place]) 02/11/2018  ?  Priority: High  ? Hyperlipidemia  associated with type 2 diabetes mellitus ([place]) 06/04/2017  ?  Priority: High  ? Type 2 diabetes mellitus with nephropathy ([place]) 09/10/2016  ?  Priority: High  ? Hypertension associated with diabetes ([place]) 09/10/2016  ?  Priority: High  ? Nicotine dependence, cigarettes, w unsp disorders 09/10/2016  ?  Priority: High  ? Coronary artery disease of native artery of native heart with stable angina pectoris ([place])   ?  Priority: High  ? Depression, major, recurrent, in partial remission ([place])   ?  Priority: High  ? Cholelithiasis 10/04/2021  ?  Priority: Medium   ? Claudication ([place]) 08/28/2021  ?  Priority: Medium   ? History of sepsis 05/11/2021  ?  Priority: Medium   ? Chronic kidney disease, stage 3a ([place]) 02/07/2021  ?  Priority: Medium   ? Chronic hyponatremia 02/07/2021  ?  Priority: Medium   ? Bilateral renal cysts 02/06/2021  ?  Priority: Medium   ? Hyponatremia 01/02/2021  ?   Priority: Medium   ? Chronic midline low back pain without sciatica 12/26/2020  ?  Priority: Medium   ? Serrated polyp of colon 08/12/2018  ?  Priority: Medium   ? Osteopenia 06/10/2017  ?  Priority: Medium   ? Encounter for screening for lung cancer 02/10/2020  ?  Priority: Low  ? Mild intermittent asthma 08/06/2019  ?  Priority: Low  ? ?Current Meds  ?Medication Sig  ? albuterol (VENTOLIN HFA) 108 (90 Base) MCG/ACT inhaler USE 2 INHALATIONS BY MOUTH EVERY 6 HOURS AS NEEDED FOR WHEEZING  OR SHORTNESS OF BREATH  ? aspirin 81 MG tablet Take 81 mg by mouth at bedtime.  ? ciprofloxacin (CIPRO) 250 MG tablet Take 1 tablet (250 mg total) by mouth 2 (two) times daily for 7 days.  ? cloNIDine (CATAPRES) 0.1 MG tablet Take 1 tablet (0.1 mg total) by mouth 3 (three) times daily.  ? diphenhydrAMINE (BENADRYL) 25 MG tablet Take 25 mg by mouth every 6 (six) hours as needed for allergies or sleep.  ? fluocinonide (LIDEX) 0.05 % external solution Apply 1 application topically daily.  ? ketorolac (ACULAR) 0.5 % ophthalmic solution Place 1 drop into the right eye 4 (four) times daily.  ? lisinopril (ZESTRIL) 40 MG tablet Take 1 tablet (40 mg total) by mouth daily.  ? meloxicam (MOBIC) 15 MG tablet Take 1 tablet (15 mg total) by mouth daily.  ? metFORMIN (GLUCOPHAGE) 1000 MG tablet Take 0.5 tablets (500 mg total) by mouth 2 (two) times daily with a meal. TAKE 1 TABLET BY MOUTH  TWICE DAILY WITH MEALS  ? moxifloxacin (VIGAMOX) 0.5 % ophthalmic solution Place 1 drop into the right eye 4 (four) times daily.  ? Multiple Vitamins-Minerals (MULTIVITAMIN WITH MINERALS) tablet Take 1 tablet by mouth daily. Centrum silver for women  ? prednisoLONE acetate (PRED FORTE) 1 % ophthalmic suspension Place 1 drop into the right eye 4 (four) times daily.  ? promethazine (PHENERGAN) 25 MG tablet Take 1 tablet (25 mg total) by mouth every 6 (six) hours as needed for nausea or vomiting.  ? venlafaxine XR (EFFEXOR-XR) 75 MG 24 hr capsule TAKE THREE  CAPSULES BY MOUTH DAILY WITH BREAKFAST  ? [DISCONTINUED] ALPRAZolam (XANAX) 0.5 MG tablet TAKE ONE TABLET BY MOUTH DAILY AS NEEDED FOR ANXIETY  ? [DISCONTINUED] atorvastatin (LIPITOR) 20 MG tablet Take 1 tablet (20 mg total) by mouth daily.  ? [DISCONTINUED] atorvastatin (LIPITOR) 20 MG tablet Take 2 tablets (40 mg total) by mouth daily.  ? [  DISCONTINUED] metFORMIN (GLUCOPHAGE) 1000 MG tablet TAKE 1 TABLET BY MOUTH  TWICE DAILY WITH MEALS  ? ? ?Allergies: ?Patient has No Known Allergies. ?Family History: ?Patient family history includes AAA (abdominal aortic aneurysm) in her mother; Arthritis in her brother and mother; Dementia in her maternal grandmother; Heart attack in her brother; Hyperlipidemia in her brother; Hypertension in her brother; Hypothyroidism in her mother; Kidney disease in her mother; Other in her father; Ovarian cancer in her mother; Stroke in her brother. ?Social History:  ?Patient  reports that she has been smoking cigarettes. She has a 14.40 pack-year smoking history. She has never used smokeless tobacco. She reports current alcohol use of about 4.0 standard drinks per week. She reports that she does not use drugs. ? ?Review of Systems: ?Constitutional: Negative for fever malaise or anorexia ?Cardiovascular: negative for chest pain ?Respiratory: negative for SOB or persistent cough ?Gastrointestinal: negative for abdominal pain ? ?Objective  ?Vitals: BP (!) 154/84   Pulse 64   Temp 98.5 ?F (36.9 ?C) (Temporal)   Ht '5\' 7"'$  (1.702 m)   Wt 192 lb 6.4 oz (87.3 kg)   LMP  (LMP Unknown)   SpO2 97%   BMI 30.13 kg/m?  ?General: no acute distress , A&Ox3, nontoxic-appearing ?HEENT: PEERL, conjunctiva normal, neck is supple ?Cardiovascular:  RRR without murmur or gallop.  ?Respiratory:  Good breath sounds bilaterally, CTAB with normal respiratory effort ?Gastrointestinal: soft, flat abdomen, normal active bowel sounds, no palpable masses, no hepatosplenomegaly, no appreciated hernias ?No CVA  tenderness ?Skin:  Warm, no rashes ? ?Office Visit on 02/12/2022  ?Component Date Value Ref Range Status  ? Color, UA 02/12/2022 yellow   Final  ? Clarity, UA 02/12/2022 cloudy   Final  ? Glucose, UA 02/12/2022 Negative  Ne

## 2022-02-14 ENCOUNTER — Other Ambulatory Visit: Payer: Self-pay

## 2022-02-14 ENCOUNTER — Ambulatory Visit
Admission: RE | Admit: 2022-02-14 | Discharge: 2022-02-14 | Disposition: A | Discharge status: Home / Self Care | Payer: Medicare Other | Source: Ambulatory Visit | Attending: Family Medicine | Admitting: Family Medicine

## 2022-02-14 DIAGNOSIS — N281 Cyst of kidney, acquired: Secondary | ICD-10-CM

## 2022-02-14 DIAGNOSIS — K8020 Calculus of gallbladder without cholecystitis without obstruction: Secondary | ICD-10-CM | POA: Diagnosis not present

## 2022-02-14 LAB — URINE CULTURE
MICRO NUMBER:: 13152457
SPECIMEN QUALITY:: ADEQUATE

## 2022-02-14 MED ORDER — GADOBENATE DIMEGLUMINE 529 MG/ML IV SOLN
18.0000 mL | Freq: Once | INTRAVENOUS | Status: AC | PRN
  Administered 2022-02-14: 18 mL via INTRAVENOUS

## 2022-02-14 NOTE — Progress Notes (Signed)
Please call patient: I have reviewed his/her lab results. Urine is again infected. Please take all antibiotics as ordered.  ?Then start septra DS 1 tab daily to prevent further infections. Please order #[id number] w/ 3 refill. ?F/u with me if symptoms worsen.

## 2022-02-16 ENCOUNTER — Other Ambulatory Visit: Payer: Self-pay

## 2022-02-16 DIAGNOSIS — N390 Urinary tract infection, site not specified: Secondary | ICD-10-CM

## 2022-02-16 MED ORDER — SULFAMETHOXAZOLE-TRIMETHOPRIM 800-160 MG PO TABS: 1.0000 | ORAL_TABLET | Freq: Two times a day (BID) | ORAL | 3 refills | Status: DC

## 2022-02-19 ENCOUNTER — Telehealth: Payer: Self-pay | Admitting: Family Medicine

## 2022-02-19 NOTE — Telephone Encounter (Signed)
Pt is asking to find out the results of her MRI. Please call when the results are read ?

## 2022-02-20 NOTE — Telephone Encounter (Signed)
Result note has been sent for nurses to call with results ?

## 2022-02-25 ENCOUNTER — Emergency Department (HOSPITAL_COMMUNITY)
Admission: EM | Admit: 2022-02-25 | Discharge: 2022-02-25 | Disposition: A | Discharge status: Home / Self Care | Payer: Medicare Other | Attending: Emergency Medicine | Admitting: Emergency Medicine

## 2022-02-25 ENCOUNTER — Emergency Department (HOSPITAL_COMMUNITY): Payer: Medicare Other

## 2022-02-25 ENCOUNTER — Encounter (HOSPITAL_COMMUNITY): Payer: Self-pay

## 2022-02-25 ENCOUNTER — Other Ambulatory Visit: Payer: Self-pay

## 2022-02-25 DIAGNOSIS — Z7984 Long term (current) use of oral hypoglycemic drugs: Secondary | ICD-10-CM | POA: Diagnosis not present

## 2022-02-25 DIAGNOSIS — Z79899 Other long term (current) drug therapy: Secondary | ICD-10-CM | POA: Insufficient documentation

## 2022-02-25 DIAGNOSIS — R051 Acute cough: Secondary | ICD-10-CM | POA: Diagnosis not present

## 2022-02-25 DIAGNOSIS — R300 Dysuria: Secondary | ICD-10-CM | POA: Insufficient documentation

## 2022-02-25 DIAGNOSIS — Z7982 Long term (current) use of aspirin: Secondary | ICD-10-CM | POA: Insufficient documentation

## 2022-02-25 DIAGNOSIS — Z20822 Contact with and (suspected) exposure to covid-19: Secondary | ICD-10-CM | POA: Insufficient documentation

## 2022-02-25 DIAGNOSIS — E119 Type 2 diabetes mellitus without complications: Secondary | ICD-10-CM | POA: Diagnosis not present

## 2022-02-25 DIAGNOSIS — R509 Fever, unspecified: Secondary | ICD-10-CM | POA: Diagnosis not present

## 2022-02-25 DIAGNOSIS — R059 Cough, unspecified: Secondary | ICD-10-CM | POA: Diagnosis not present

## 2022-02-25 DIAGNOSIS — E871 Hypo-osmolality and hyponatremia: Secondary | ICD-10-CM | POA: Insufficient documentation

## 2022-02-25 DIAGNOSIS — Z87440 Personal history of urinary (tract) infections: Secondary | ICD-10-CM | POA: Insufficient documentation

## 2022-02-25 DIAGNOSIS — I10 Essential (primary) hypertension: Secondary | ICD-10-CM | POA: Insufficient documentation

## 2022-02-25 DIAGNOSIS — E870 Hyperosmolality and hypernatremia: Secondary | ICD-10-CM | POA: Diagnosis not present

## 2022-02-25 LAB — CBC WITH DIFFERENTIAL/PLATELET
Abs Immature Granulocytes: 0.04 10*3/uL (ref 0.00–0.07)
Basophils Absolute: 0 10*3/uL (ref 0.0–0.1)
Basophils Relative: 0 %
Eosinophils Absolute: 0.1 10*3/uL (ref 0.0–0.5)
Eosinophils Relative: 1 %
HCT: 35.7 % — ABNORMAL LOW (ref 36.0–46.0)
Hemoglobin: 12.5 g/dL (ref 12.0–15.0)
Immature Granulocytes: 0 %
Lymphocytes Relative: 4 %
Lymphs Abs: 0.4 10*3/uL — ABNORMAL LOW (ref 0.7–4.0)
MCH: 31.3 pg (ref 26.0–34.0)
MCHC: 35 g/dL (ref 30.0–36.0)
MCV: 89.5 fL (ref 80.0–100.0)
Monocytes Absolute: 0.5 10*3/uL (ref 0.1–1.0)
Monocytes Relative: 6 %
Neutro Abs: 8.1 10*3/uL — ABNORMAL HIGH (ref 1.7–7.7)
Neutrophils Relative %: 89 %
Platelets: 229 10*3/uL (ref 150–400)
RBC: 3.99 MIL/uL (ref 3.87–5.11)
RDW: 12.5 % (ref 11.5–15.5)
WBC: 9.1 10*3/uL (ref 4.0–10.5)
nRBC: 0 % (ref 0.0–0.2)

## 2022-02-25 LAB — URINALYSIS, ROUTINE W REFLEX MICROSCOPIC
Bilirubin Urine: NEGATIVE
Glucose, UA: NEGATIVE mg/dL
Ketones, ur: NEGATIVE mg/dL
Leukocytes,Ua: NEGATIVE
Nitrite: NEGATIVE
Protein, ur: >300 mg/dL — AB
Specific Gravity, Urine: 1.02 (ref 1.005–1.030)
pH: 6 (ref 5.0–8.0)

## 2022-02-25 LAB — COMPREHENSIVE METABOLIC PANEL
ALT: 25 U/L (ref 0–44)
AST: 23 U/L (ref 15–41)
Albumin: 3.7 g/dL (ref 3.5–5.0)
Alkaline Phosphatase: 112 U/L (ref 38–126)
Anion gap: 8 (ref 5–15)
BUN: 19 mg/dL (ref 8–23)
CO2: 20 mmol/L — ABNORMAL LOW (ref 22–32)
Calcium: 9.9 mg/dL (ref 8.9–10.3)
Chloride: 100 mmol/L (ref 98–111)
Creatinine, Ser: 1.43 mg/dL — ABNORMAL HIGH (ref 0.44–1.00)
GFR, Estimated: 39 mL/min — ABNORMAL LOW (ref >60)
Glucose, Bld: 144 mg/dL — ABNORMAL HIGH (ref 70–99)
Potassium: 4.1 mmol/L (ref 3.5–5.1)
Sodium: 128 mmol/L — ABNORMAL LOW (ref 135–145)
Total Bilirubin: 0.8 mg/dL (ref 0.3–1.2)
Total Protein: 7.1 g/dL (ref 6.5–8.1)

## 2022-02-25 LAB — RESP PANEL BY RT-PCR (FLU A&B, COVID) ARPGX2
Influenza A by PCR: NEGATIVE
Influenza B by PCR: NEGATIVE
SARS Coronavirus 2 by RT PCR: NEGATIVE

## 2022-02-25 LAB — PROTIME-INR
INR: 1.1 (ref 0.8–1.2)
Prothrombin Time: 14.2 seconds (ref 11.4–15.2)

## 2022-02-25 LAB — URINALYSIS, MICROSCOPIC (REFLEX)

## 2022-02-25 LAB — LACTIC ACID, PLASMA: Lactic Acid, Venous: 1.1 mmol/L (ref 0.5–1.9)

## 2022-02-25 NOTE — ED Provider Notes (Signed)
?[facility] DEPT ?Provider Note ? ? ?CSN: [id number] ?Arrival date & time: 02/25/22  1640 ? ?  ? ?History ? ?Chief Complaint  ?Patient presents with  ? Fever  ? ? ?Jessica Curry is a 71 y.o. female history of frequent urinary tract infections, chronic hyponatremia here for evaluation of fever.  Diagnosed with UTI 02/12/22.  Started on Cipro.  Completed this for 5 days.  Eventually placed on Bactrim for UTI suppression.  Had MRI abdomen on 02/16/2022 which showed likely simple cyst of right kidney however no complicating factors from UTI.  Patient states Tmax 103 at home over the last 3 days.  She is taking Tylenol and ibuprofen, last dose just prior to arrival.  States she does not typically get pain with her UTIs however gets cloudy urine and is malodorous.  She is unsure if her urine is having these features currently.  She does have a mild cough which is nonproductive.  She feels this is due to her seasonal allergies.  No headache, congestion, rhinorrhea, chest pain, shortness of breath, abdominal pain, flank pain, rashes or lesions.  She has been compliant with her Bactrim suppression at home. ? ?HPI ? ?  ? ?Home Medications ?Prior to Admission medications   ?Medication Sig Start Date End Date Taking? Authorizing Provider  ?albuterol (VENTOLIN HFA) 108 (90 Base) MCG/ACT inhaler USE 2 INHALATIONS BY MOUTH EVERY 6 HOURS AS NEEDED FOR WHEEZING  OR SHORTNESS OF BREATH 11/28/21   Leamon Arnt, MD  ?ALPRAZolam Duanne Moron) 0.5 MG tablet TAKE ONE TABLET BY MOUTH DAILY AS NEEDED FOR ANXIETY 02/12/22   Leamon Arnt, MD  ?amLODipine (NORVASC) 5 MG tablet Take 1 tablet (5 mg total) by mouth daily. 10/03/21 12/02/21  Leamon Arnt, MD  ?aspirin 81 MG tablet Take 81 mg by mouth at bedtime.    [provider]  ?atorvastatin (LIPITOR) 40 MG tablet Take 1 tablet (40 mg total) by mouth at bedtime. 02/12/22   Leamon Arnt, MD  ?cloNIDine (CATAPRES) 0.1 MG tablet Take 1 tablet (0.1 mg total) by  mouth 3 (three) times daily. 11/24/21   Leamon Arnt, MD  ?diphenhydrAMINE (BENADRYL) 25 MG tablet Take 25 mg by mouth every 6 (six) hours as needed for allergies or sleep.    [provider]  ?fluocinonide (LIDEX) 0.05 % external solution Apply 1 application topically daily. 03/23/21   [provider]  ?ketorolac (ACULAR) 0.5 % ophthalmic solution Place 1 drop into the right eye 4 (four) times daily. 10/06/21   [provider]  ?lisinopril (ZESTRIL) 40 MG tablet Take 1 tablet (40 mg total) by mouth daily. 07/03/21   Leamon Arnt, MD  ?meloxicam (MOBIC) 15 MG tablet Take 1 tablet (15 mg total) by mouth daily. 08/18/21   Leamon Arnt, MD  ?metFORMIN (GLUCOPHAGE) 1000 MG tablet Take 0.5 tablets (500 mg total) by mouth 2 (two) times daily with a meal. TAKE 1 TABLET BY MOUTH  TWICE DAILY WITH MEALS 02/12/22   Leamon Arnt, MD  ?moxifloxacin (VIGAMOX) 0.5 % ophthalmic solution Place 1 drop into the right eye 4 (four) times daily. 10/06/21   [provider]  ?Multiple Vitamins-Minerals (MULTIVITAMIN WITH MINERALS) tablet Take 1 tablet by mouth daily. Centrum silver for women    [provider]  ?nitroGLYCERIN (NITROSTAT) 0.4 MG SL tablet Place 1 tablet (0.4 mg total) under the tongue every 5 (five) minutes as needed for chest pain. 02/11/18 11/24/21  Burtis Junes,  NP  ?prednisoLONE acetate (PRED FORTE) 1 % ophthalmic suspension Place 1 drop into the right eye 4 (four) times daily. 10/06/21   [provider]  ?promethazine (PHENERGAN) 25 MG tablet Take 1 tablet (25 mg total) by mouth every 6 (six) hours as needed for nausea or vomiting. 10/03/21   Leamon Arnt, MD  ?sulfamethoxazole-trimethoprim (BACTRIM DS) 800-160 MG tablet Take 1 tablet by mouth 2 (two) times daily. 02/16/22   Leamon Arnt, MD  ?venlafaxine XR (EFFEXOR-XR) 75 MG 24 hr capsule TAKE THREE CAPSULES BY MOUTH DAILY WITH BREAKFAST 07/10/21   Leamon Arnt, MD  ?   ? ?Allergies    ?Patient  has no known allergies.   ? ?Review of Systems   ?Review of Systems  ?Constitutional:  Positive for fever.  ?HENT: Negative.    ?Respiratory: Negative.    ?Cardiovascular: Negative.   ?Gastrointestinal: Negative.   ?Genitourinary:  Negative for difficulty urinating, dysuria, flank pain, frequency, hematuria, pelvic pain, urgency, vaginal bleeding, vaginal discharge and vaginal pain.  ?Musculoskeletal: Negative.   ?Skin: Negative.   ?Neurological: Negative.   ?All other systems reviewed and are negative. ? ?Physical Exam ?Updated Vital Signs ?BP (!) 148/98   Pulse (!) 56   Temp 98.4 ?F (36.9 ?C) (Oral)   Resp 18   Ht '5\' 7"'$  (1.702 m)   Wt 86.2 kg   LMP  (LMP Unknown)   SpO2 95%   BMI 29.76 kg/m?  ?Physical Exam ?Vitals and nursing note reviewed.  ?Constitutional:   ?   General: She is not in acute distress. ?   Appearance: She is well-developed. She is not ill-appearing.  ?HENT:  ?   Head: Normocephalic and atraumatic.  ?   Nose: Nose normal.  ?   Mouth/Throat:  ?   Mouth: Mucous membranes are moist.  ?Eyes:  ?   Pupils: Pupils are equal, round, and reactive to light.  ?Cardiovascular:  ?   Rate and Rhythm: Normal rate.  ?   Pulses: Normal pulses.  ?   Heart sounds: Normal heart sounds.  ?Pulmonary:  ?   Effort: Pulmonary effort is normal. No respiratory distress.  ?   Breath sounds: Normal breath sounds.  ?Abdominal:  ?   General: Bowel sounds are normal. There is no distension.  ?   Palpations: Abdomen is soft.  ?   Tenderness: There is no abdominal tenderness. There is no guarding or rebound.  ?   Comments: Abdomen soft, nontender.  No rebound or guarding.  ?Musculoskeletal:     ?   General: No swelling, tenderness, deformity or signs of injury. Normal range of motion.  ?   Cervical back: Normal range of motion.  ?   Right lower leg: No edema.  ?   Left lower leg: No edema.  ?   Comments: No bony tenderness, full range of motion  ?Skin: ?   General: Skin is warm and dry.  ?   Capillary Refill: Capillary  refill takes less than 2 seconds.  ?   Comments: No rashes or lesions.  ?Neurological:  ?   General: No focal deficit present.  ?   Mental Status: She is alert and oriented to person, place, and time.  ?   Comments: CN 2-12 grossly intact ?Ambulatory  ?Psychiatric:     ?   Mood and Affect: Mood normal.  ? ? ?ED Results / Procedures / Treatments   ?Labs ?(all labs ordered are listed, but only abnormal results are  displayed) ?Labs Reviewed  ?CBC WITH DIFFERENTIAL/PLATELET - Abnormal; Notable for the following components:  ?    Result Value  ? HCT 35.7 (*)   ? Neutro Abs 8.1 (*)   ? Lymphs Abs 0.4 (*)   ? All other components within normal limits  ?COMPREHENSIVE METABOLIC PANEL - Abnormal; Notable for the following components:  ? Sodium 128 (*)   ? CO2 20 (*)   ? Glucose, Bld 144 (*)   ? Creatinine, Ser 1.43 (*)   ? GFR, Estimated 39 (*)   ? All other components within normal limits  ?URINALYSIS, ROUTINE W REFLEX MICROSCOPIC - Abnormal; Notable for the following components:  ? Hgb urine dipstick SMALL (*)   ? Protein, ur >300 (*)   ? All other components within normal limits  ?URINALYSIS, MICROSCOPIC (REFLEX) - Abnormal; Notable for the following components:  ? Bacteria, UA RARE (*)   ? All other components within normal limits  ?RESP PANEL BY RT-PCR (FLU A&B, COVID) [id number]  ?CULTURE, BLOOD (ROUTINE X 2)  ?CULTURE, BLOOD (ROUTINE X 2)  ?URINE CULTURE  ?LACTIC ACID, PLASMA  ?PROTIME-INR  ? ? ?EKG ?None ? ?Radiology ?DG Chest 2 View ? ?Result Date: 02/25/2022 ?CLINICAL DATA:  Cough EXAM: CHEST - 2 VIEW COMPARISON:  None. FINDINGS: The heart size and mediastinal contours are within normal limits. Both lungs are clear. The visualized skeletal structures are unremarkable. IMPRESSION: No active cardiopulmonary disease. Electronically Signed   By: Fidela Salisbury M.D.   On: 02/25/2022 17:39   ? ?Procedures ?Procedures  ? ? ?Medications Ordered in ED ?Medications - No data to display ? ?ED Course/ Medical Decision Making/ A&P ?   ? ?71 year old here for evaluation of fever.  Recently diagnosed with UTI, completed 5-day course of Cipro, started on Bactrim prophylaxis which she has been compliant with for the last 4 days.  Fever over the las

## 2022-02-25 NOTE — Discharge Instructions (Signed)
Your work-up here in the emergency department was reassuring ? ?Keep close follow-up with your primary care provider ? ?Return for any worsening symptoms ?

## 2022-02-25 NOTE — ED Provider Notes (Signed)
?  Face-to-face evaluation ? ? ?History: She presents for evaluation of fever and suspected UTI.  She is currently on second course of antibiotic, for persistent UTI symptoms. ? ?Physical exam: Elderly, alert cooperative.  No respiratory distress.  No dysarthria or aphasia.  She is calm and comfortable. ? ?MDM: Evaluation for  ?Chief Complaint  ?Patient presents with  ? Fever  ?  ? ?Patient presenting with fevers and concern for UTI.  She is afebrile here.  She has been taking Tylenol for fever.  I encouraged her to continue doing that.  Currently no evidence for bacteremia, sepsis, UTI, COVID infection or fluid infection.  She is stable for discharge with outpatient management. ? ?Medical screening examination/treatment/procedure(s) were conducted as a shared visit with non-physician practitioner(s) and myself.  I personally evaluated the patient during the encounter ? ?  ?Daleen Bo, MD ?02/25/22 2307 ? ?

## 2022-02-25 NOTE — ED Notes (Signed)
Lab is adding on urine culture.  

## 2022-02-25 NOTE — ED Triage Notes (Signed)
Patient reports having a fever between 101.1 and 103.4 x 3 days. ?Patient states she has had an intermittent UTI x 1 year and states that she is currently taking antibiotics. ?

## 2022-02-26 ENCOUNTER — Telehealth: Payer: Self-pay | Admitting: Family Medicine

## 2022-02-26 NOTE — Telephone Encounter (Signed)
FYI, please see details below. ?

## 2022-02-26 NOTE — Telephone Encounter (Signed)
Pt was seen in ED on 02/25/22. Also has a follow up appt on 02/28/22 at 11:30 with Dr Jonni Sanger. ? ?Patient ?Name: ?Deeana ALBR ?IGHT  ?Gender: Female ?DOB: 1951/01/30 ?Age: 71 Y 25 M 23 D ?Return ?Phone ?Number: ?[phone] ?(Primary) ?Address: ?City/ ?State/ ?Zip: ?[place] ? [address] ?Client [facility] - ?[place] ?Presenter, broadcasting at [place] [facility] ?Provider Billey Chang- MD ?Contact Type Call ?Who Is Calling Patient / Member / Family / Caregiver ?Call Type Triage / Clinical ?Relationship To Patient Self ?Return Phone Number [phone] (Primary) ?Chief Complaint Fever (non-urgent symptom) (greater than THREE ?MONTHS old) ?Reason for Call Symptomatic / Request for Health Information ?Initial Comment Caller is shaking, fever 102, needs to schedule an ?appt to be seen tomorrow. ?Translation No ?Nurse Assessment ?Nurse: Lavina Hamman, RN, Thomasena Edis Date/Time (Eastern Time): 02/25/2022 3:56:11 PM ?Confirm and document reason for call. If ?symptomatic, describe symptoms. ?---Caller states fever 102.0 that started yesterday. ?Currently shaking, shivering. No other symptoms. ?Does the patient have any new or worsening ?symptoms? ---Yes ?Will a triage be completed? ---Yes ?Related visit to physician within the last 2 weeks? ---No ?Does the PT have any chronic conditions? (i.e. ?diabetes, asthma, this includes High risk factors for ?pregnancy, etc.) ?---Yes ?List chronic conditions. ---DM, HTN ?Is this a behavioral health or substance abuse call? ---No ?Guidelines ?Guideline Title Affirmed Question Affirmed Notes Nurse Date/Time (Eastern ?Time) ?Fever [1] Fever > 101 F ?(38.3 C) AND [2] age ?> 60 years ?Lavina Hamman, RN, Thomasena Edis 02/25/2022 3:57:30 PM ?Disp. Time (Eastern ?Time) Disposition Final User ?02/25/2022 3:59:59 PM See HCP within 4 Hours (or ?PCP triage) ?Deatra Ina, RN, Thomasena Edis ?Caller Disagree/Comply Comply ?Caller Understands Yes ?PreDisposition InappropriateToAsk ?Care Advice Given Per  Guideline ?SEE HCP (OR PCP TRIAGE) WITHIN 4 HOURS: * IF OFFICE WILL BE OPEN: You need to be seen within the next 3 or 4 ?hours. Call your doctor (or NP/PA) now or as soon as the office opens. CALL BACK IF: * You become worse CARE ADVICE ?given per Fever (Adult) guideline. ?Referrals ?[facility] - ED ?

## 2022-02-27 LAB — URINE CULTURE: Culture: 40000 — AB

## 2022-02-28 ENCOUNTER — Ambulatory Visit (HOSPITAL_BASED_OUTPATIENT_CLINIC_OR_DEPARTMENT_OTHER)
Admission: RE | Admit: 2022-02-28 | Discharge: 2022-02-28 | Disposition: A | Discharge status: Home / Self Care | Payer: Medicare Other | Source: Ambulatory Visit | Attending: Family Medicine | Admitting: Family Medicine

## 2022-02-28 ENCOUNTER — Ambulatory Visit (INDEPENDENT_AMBULATORY_CARE_PROVIDER_SITE_OTHER): Payer: Medicare Other | Admitting: Family Medicine

## 2022-02-28 ENCOUNTER — Encounter: Payer: Self-pay | Admitting: Family Medicine

## 2022-02-28 VITALS — BP 102/67 | HR 62 | Temp 96.9°F | Ht 67.0 in | Wt 188.8 lb

## 2022-02-28 DIAGNOSIS — R1011 Right upper quadrant pain: Secondary | ICD-10-CM | POA: Diagnosis not present

## 2022-02-28 DIAGNOSIS — R509 Fever, unspecified: Secondary | ICD-10-CM

## 2022-02-28 DIAGNOSIS — N3000 Acute cystitis without hematuria: Secondary | ICD-10-CM

## 2022-02-28 DIAGNOSIS — E871 Hypo-osmolality and hyponatremia: Secondary | ICD-10-CM

## 2022-02-28 DIAGNOSIS — I9589 Other hypotension: Secondary | ICD-10-CM | POA: Diagnosis not present

## 2022-02-28 DIAGNOSIS — E861 Hypovolemia: Secondary | ICD-10-CM | POA: Diagnosis not present

## 2022-02-28 DIAGNOSIS — K8020 Calculus of gallbladder without cholecystitis without obstruction: Secondary | ICD-10-CM | POA: Diagnosis not present

## 2022-02-28 MED ORDER — SULFAMETHOXAZOLE-TRIMETHOPRIM 800-160 MG PO TABS: 1.0000 | ORAL_TABLET | Freq: Every day | ORAL | 3 refills | Status: DC

## 2022-02-28 MED ORDER — METFORMIN HCL 1000 MG PO TABS: 500.0000 mg | ORAL_TABLET | Freq: Two times a day (BID) | ORAL | 3 refills | Status: DC

## 2022-02-28 NOTE — Patient Instructions (Addendum)
Your blood pressure is low which is contributing to how badly you are feeling. ?Please hold your lisinopril until you are feeling better.  ? ?I want an ultrasound of your gallbladder to ensure you don't have an infection there due to the gallstones.  ? ?Continue your other medications. ?Monitor your blood pressures at home. Can restart the lisinopril if your blood pressure gets high again.  ? ? ?

## 2022-02-28 NOTE — Progress Notes (Deleted)
?Phone: [phone] ?  ?Subjective:  ?Patient presents today for their annual physical. Chief complaint-noted.  ? ?See problem oriented charting- ?ROS- full  review of systems was completed and negative  ?except for: *** ? ?The following were reviewed and entered/updated in epic: ?Past Medical History:  ?Diagnosis Date  ? Allergy   ? Asthma   ? Chicken pox   ? Depression   ? reasonable control on venlafaxine '75mg'$  TID. xanax #14 lasts about 2 years for anxiety portion.   ? Diabetes mellitus   ? Genital warts   ? no outbreaks since the 63s  ? 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 Hyperlipidemia   ? Hypertension   ? ?Patient Active Problem List  ? Diagnosis Date Noted  ? Cholelithiasis 10/04/2021  ? Claudication ([place]) 08/28/2021  ? History of sepsis 05/11/2021  ? Chronic kidney disease, stage 3a ([place]) 02/07/2021  ? Chronic hyponatremia 02/07/2021  ? Bilateral renal cysts 02/06/2021  ? Hyponatremia 01/02/2021  ? Chronic midline low back pain without sciatica 12/26/2020  ? Encounter for screening for lung cancer 02/10/2020  ? 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 Mild intermittent asthma 08/06/2019  ? Serrated polyp of colon 08/12/2018  ? Simple chronic bronchitis ([place]) 02/11/2018  ? Osteopenia 06/10/2017  ? Hyperlipidemia associated with type 2 diabetes mellitus ([place]) 06/04/2017  ? Type 2 diabetes mellitus with nephropathy ([place]) 09/10/2016  ? Hypertension associated with diabetes ([place]) 09/10/2016  ? Nicotine dependence, cigarettes, w unsp disorders 09/10/2016  ? Coronary artery disease of native artery of native heart with stable angina pectoris ([place])   ? Depression, major, recurrent, in partial remission ([place])   ? ?Past Surgical History:  ?Procedure Laterality Date  ? CORONARY ANGIOPLASTY WITH STENT PLACEMENT    ? 2012, [place]  ? DILATION AND CURETTAGE OF UTERUS    ? TONSILLECTOMY  1968  ? ? ?Family History  ?Problem Relation Age of Onset  ? Arthritis Mother   ? Ovarian cancer Mother   ? AAA (abdominal  aortic aneurysm) Mother   ? Kidney disease Mother   ? Hypothyroidism Mother   ? Other Father   ?     does not know family history for father  ? Stroke Brother   ?     retired MD. [employer]  ? Heart attack Brother   ? Arthritis Brother   ?     hip replacement  ? Hyperlipidemia Brother   ? Hypertension Brother   ? Dementia Maternal Grandmother   ? Colon cancer Neg Hx   ? Esophageal cancer Neg Hx   ? Rectal cancer Neg Hx   ? Stomach cancer Neg Hx   ? ? ?Medications- reviewed and updated ?Current Outpatient Medications  ?Medication Sig Dispense Refill  ? albuterol (VENTOLIN HFA) 108 (90 Base) MCG/ACT inhaler USE 2 INHALATIONS BY MOUTH EVERY 6 HOURS AS NEEDED FOR WHEEZING  OR SHORTNESS OF BREATH 34 g 3  ? ALPRAZolam (XANAX) 0.5 MG tablet TAKE ONE TABLET BY MOUTH DAILY AS NEEDED FOR ANXIETY 30 tablet 0  ? aspirin 81 MG tablet Take 81 mg by mouth at bedtime.    ? atorvastatin (LIPITOR) 40 MG tablet Take 1 tablet (40 mg total) by mouth at bedtime. 90 tablet 3  ? cloNIDine (CATAPRES) 0.1 MG tablet Take 1 tablet (0.1 mg total) by mouth 3 (three) times daily. 270 tablet 3  ? diphenhydrAMINE (BENADRYL) 25 MG tablet Take 25 mg by mouth every 6 (six) hours as needed for allergies  or sleep.    ? fluocinonide (LIDEX) 0.05 % external solution Apply 1 application topically daily.    ? lisinopril (ZESTRIL) 40 MG tablet Take 1 tablet (40 mg total) by mouth daily. 90 tablet 3  ? meloxicam (MOBIC) 15 MG tablet Take 1 tablet (15 mg total) by mouth daily. 90 tablet 3  ? metFORMIN (GLUCOPHAGE) 1000 MG tablet Take 0.5 tablets (500 mg total) by mouth 2 (two) times daily with a meal. TAKE 1 TABLET BY MOUTH  TWICE DAILY WITH MEALS 180 tablet 3  ? Multiple Vitamins-Minerals (MULTIVITAMIN WITH MINERALS) tablet Take 1 tablet by mouth daily. Centrum silver for women    ? promethazine (PHENERGAN) 25 MG tablet Take 1 tablet (25 mg total) by mouth every 6 (six) hours as needed for nausea or vomiting. 20 tablet 0  ? venlafaxine XR (EFFEXOR-XR) 75 MG 24 hr  capsule TAKE THREE CAPSULES BY MOUTH DAILY WITH BREAKFAST 30 capsule 0  ? amLODipine (NORVASC) 5 MG tablet Take 1 tablet (5 mg total) by mouth daily. 90 tablet 3  ? ketorolac (ACULAR) 0.5 % ophthalmic solution Place 1 drop into the right eye 4 (four) times daily. (Patient not taking: Reported on 02/28/2022)    ? nitroGLYCERIN (NITROSTAT) 0.4 MG SL tablet Place 1 tablet (0.4 mg total) under the tongue every 5 (five) minutes as needed for chest pain. 25 tablet 3  ? sulfamethoxazole-trimethoprim (BACTRIM DS) 800-160 MG tablet Take 1 tablet by mouth 2 (two) times daily. (Patient not taking: Reported on 02/28/2022) 90 tablet 3  ? ?No current facility-administered medications for this visit.  ? ? ?Allergies-reviewed and updated ?No Known Allergies ? ?Social History  ? ?Social History Narrative  ? Divorced. Did not ask about children.   ?   ? Lives with mother  ? BS undergrad.   ? Now struggles to find work and has financial struggles- getting on medicare has helped.   ? 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 Does not "mow or blow"  ? ?Objective  ?Objective:  ?BP 102/67   Pulse 62   Temp (!) 96.9 ?F (36.1 ?C) (Temporal)   Ht '5\' 7"'$  (1.702 m)   Wt 188 lb 12.8 oz (85.6 kg)   LMP  (LMP Unknown)   SpO2 97%   BMI 29.57 kg/m?  ?Gen: NAD, resting comfortably ?HEENT: Mucous membranes are moist. Oropharynx normal ?Neck: no thyromegaly ?CV: RRR no murmurs rubs or gallops ?Lungs: CTAB no crackles, wheeze, rhonchi ?Abdomen: soft/nontender/nondistended/normal bowel sounds. No rebound or guarding.  ?Ext: no edema ?Skin: warm, dry ?Neuro: grossly normal, moves all extremities, PERRLA ?*** ?  ?Assessment and Plan  ?71 y.o. female presenting for annual physical.  ?Health Maintenance counseling: ?1. Anticipatory guidance: Patient counseled regarding regular dental exams ***q6 months, eye exams ***yearly,  avoiding smoking and second hand smoke*** ,  limiting alcohol to 2 beverages per day ***.   ?2. Risk factor reduction:  Advised patient of need for regular exercise and diet rich and fruits and vegetables to reduce risk of heart attack and stroke.  ?Exercise- ***.  ?Diet/weight management-***.  ?Wt Readings from Last 3 Encounters:  ?02/28/22 188 lb 12.8 oz (85.6 kg)  ?02/25/22 190 lb (86.2 kg)  ?02/12/22 192 lb 6.4 oz (87.3 kg)  ? ?3. Immunizations/screenings/ancillary studies ?Immunization History  ?Administered Date(s) Administered  ? Fluad Quad(high Dose 65+) 08/05/2019, 08/18/2020, 08/09/2021  ? Influenza, High Dose Seasonal  PF 08/20/2017, 08/06/2018  ? Influenza-Unspecified 09/24/2016  ? PFIZER Comirnaty(Gray Top)Covid-19 Tri-Sucrose Vaccine 04/10/2021  ? PFIZER(Purple Top)SARS-COV-2 Vaccination 12/15/2019, 01/05/2020  ? Pension scheme manager 83yr & up 09/04/2021  ? Pneumococcal Conjugate-13 09/10/2016  ? Pneumococcal Polysaccharide-23 12/02/2017  ? Tdap 08/20/2018, 12/05/2018  ? Zoster Recombinat (Shingrix) 06/05/2017, 09/18/2017  ? ?There are no preventive care reminders to display for this patient. 4. Prostate cancer screening- *** No results found for: PSA 5. Colon cancer screening - *** ?6. Skin cancer screening- ***advised regular sunscreen use. Denies worrisome, changing, or new skin lesions.  ?7. Smoking associated screening (lung cancer screening, AAA screen 65-75, UA)- *** smoker- *** ?8. STD screening - *** ? ?Status of chronic or acute concerns  ? ?No specialty comments available. ?*** ? ?Recommended follow up: ***No follow-ups on file. ?Future Appointments  ?Date Time [place] ?03/29/2022 11:00 AM ALeamon Arnt MD [facility]  ?06/01/2022  8:45 AM [facility] HEALTH COACH [facility]  ? ? ?Chief Complaint  ?Patient presents with  ? Follow-up  ?  ED: 4/2//2023; Febrile illness. Dx: UTI 02/12/2022. She was given Cipro and Bactrim.   ? Fatigue  ? ?Lab/Order associations:*** fasting ?  ICD-10-CM   ?1. Urinary  tract infection without hematuria, site unspecified  N39.0   ?  ? ? ?No orders of the defined types were placed in this encounter. ? ? ?Return precautions advised.  ?MTobe Sos CMA ? ? ?

## 2022-02-28 NOTE — Progress Notes (Signed)
? ?Subjective  ?CC:  ?Chief Complaint  ?Patient presents with  ? Follow-up  ?  ED: 4/2//2023; Febrile illness. Dx: UTI 02/12/2022. She was given Cipro and Bactrim.   ? Fatigue  ? ? ?HPI: Jessica Curry is a 71 y.o. female who presents to the office today to address the problems listed above in the chief complaint. ?71 year old female here for ED follow-up.  I saw her back on March 20 and diagnosed her with UTI.  Greater than 100,000 colonies of E. coli grew out of urine culture.  She was treated with Cipro and then started on Bactrim prophylaxis.  However, 5 days ago developed high fevers to 103.  She felt lightheaded and weak.  Emergency room evaluation did not find a clear source.  No leukocytosis.  Urine was negative.  Chest x-ray was negative.  Benign abdomen.  Chronic hyponatremia with mild worsening of sodium of 128 was found.  Patient was sent out with supportive care.  Here today in follow-up, she reports she is no longer having fevers with the last being 3 days ago.  However stills feels diaphoretic, complains of mild abdominal cramping, diarrhea that started yesterday, decreased appetite, without flank pain, urinary symptoms, severe abdominal pain.  No history of diverticulitis.  She does have cholelithiasis.  She is on Bactrim. ?Patient has hypertension but today blood pressures running very low again.  Denies palpitations or chest pain. ? ?Assessment  ?1. Febrile illness   ?2. Acute cystitis without hematuria   ?3. RUQ pain   ?4. Hypotension due to hypovolemia   ?5. Chronic hyponatremia   ? ?  ?Plan  ?Febrile illness, unclear source.:  Improving but patient is on Bactrim.  Differential diagnosis includes diverticulitis, acute cholecystitis, colitis or other intra-abdominal source.  She does have a benign abdomen.  Check right upper quadrant ultrasound today to ensure she does not have acute cholecystitis.  Change Bactrim to once daily prophylactic dosing.  Monitor fever curve.  Hydrate. ?Acute cystitis  with E. coli UTI: Resolved by negative urine culture in the ER. ?Hypertension: On multiple medications for hypertension.  Will hold lisinopril until eating and drinking better.  Monitor blood pressures at home.  No chest pain. ?Chronic hyponatremia: Has been drinking lots of water.  We will recheck at follow-up visit. ? ?Follow up: As needed ?03/29/2022 ? ?Orders Placed This Encounter  ?Procedures  ? US ABDOMEN LIMITED RUQ (LIVER/GB)  ? ?Meds ordered this encounter  ?Medications  ? sulfamethoxazole-trimethoprim (BACTRIM DS) 800-160 MG tablet  ?  Sig: Take 1 tablet by mouth daily.  ?  Dispense:  90 tablet  ?  Refill:  3  ? metFORMIN (GLUCOPHAGE) 1000 MG tablet  ?  Sig: Take 0.5 tablets (500 mg total) by mouth 2 (two) times daily with a meal.  ?  Dispense:  180 tablet  ?  Refill:  3  ? ?  ? ?I reviewed the patients updated PMH, FH, and SocHx.  ?  ?Patient Active Problem List  ? Diagnosis Date Noted  ? 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  Priority: High  ? Simple chronic bronchitis ([place]) 02/11/2018  ?  Priority: High  ? Hyperlipidemia associated with type 2 diabetes mellitus ([place]) 06/04/2017  ?  Priority: High  ? Type 2 diabetes mellitus with nephropathy ([place]) 09/10/2016  ?  Priority: High  ? Hypertension associated with diabetes ([place]) 09/10/2016  ?  Priority: High  ? Nicotine dependence, cigarettes, w unsp disorders 09/10/2016  ?  Priority:  High  ? Coronary artery disease of native artery of native heart with stable angina pectoris ([place])   ?  Priority: High  ? Depression, major, recurrent, in partial remission ([place])   ?  Priority: High  ? Cholelithiasis 10/04/2021  ?  Priority: Medium   ? Claudication ([place]) 08/28/2021  ?  Priority: Medium   ? History of sepsis 05/11/2021  ?  Priority: Medium   ? Chronic kidney disease, stage 3a ([place]) 02/07/2021  ?  Priority: Medium   ? Chronic hyponatremia 02/07/2021  ?  Priority: Medium   ? Bilateral renal cysts 02/06/2021  ?  Priority: Medium   ? Hyponatremia 01/02/2021  ?   Priority: Medium   ? Chronic midline low back pain without sciatica 12/26/2020  ?  Priority: Medium   ? Serrated polyp of colon 08/12/2018  ?  Priority: Medium   ? Osteopenia 06/10/2017  ?  Priority: Medium   ? Encounter for screening for lung cancer 02/10/2020  ?  Priority: Low  ? Mild intermittent asthma 08/06/2019  ?  Priority: Low  ? ?Current Meds  ?Medication Sig  ? albuterol (VENTOLIN HFA) 108 (90 Base) MCG/ACT inhaler USE 2 INHALATIONS BY MOUTH EVERY 6 HOURS AS NEEDED FOR WHEEZING  OR SHORTNESS OF BREATH  ? ALPRAZolam (XANAX) 0.5 MG tablet TAKE ONE TABLET BY MOUTH DAILY AS NEEDED FOR ANXIETY  ? aspirin 81 MG tablet Take 81 mg by mouth at bedtime.  ? atorvastatin (LIPITOR) 40 MG tablet Take 1 tablet (40 mg total) by mouth at bedtime.  ? cloNIDine (CATAPRES) 0.1 MG tablet Take 1 tablet (0.1 mg total) by mouth 3 (three) times daily.  ? diphenhydrAMINE (BENADRYL) 25 MG tablet Take 25 mg by mouth every 6 (six) hours as needed for allergies or sleep.  ? fluocinonide (LIDEX) 0.05 % external solution Apply 1 application topically daily.  ? lisinopril (ZESTRIL) 40 MG tablet Take 1 tablet (40 mg total) by mouth daily.  ? meloxicam (MOBIC) 15 MG tablet Take 1 tablet (15 mg total) by mouth daily.  ? Multiple Vitamins-Minerals (MULTIVITAMIN WITH MINERALS) tablet Take 1 tablet by mouth daily. Centrum silver for women  ? promethazine (PHENERGAN) 25 MG tablet Take 1 tablet (25 mg total) by mouth every 6 (six) hours as needed for nausea or vomiting.  ? venlafaxine XR (EFFEXOR-XR) 75 MG 24 hr capsule TAKE THREE CAPSULES BY MOUTH DAILY WITH BREAKFAST  ? [DISCONTINUED] metFORMIN (GLUCOPHAGE) 1000 MG tablet Take 0.5 tablets (500 mg total) by mouth 2 (two) times daily with a meal. TAKE 1 TABLET BY MOUTH  TWICE DAILY WITH MEALS (Patient taking differently: Take 500 mg by mouth 2 (two) times daily with a meal.)  ? ? ?Allergies: ?Patient has No Known Allergies. ?Family History: ?Patient family history includes AAA (abdominal aortic  aneurysm) in her mother; Arthritis in her brother and mother; Dementia in her maternal grandmother; Heart attack in her brother; Hyperlipidemia in her brother; Hypertension in her brother; Hypothyroidism in her mother; Kidney disease in her mother; Other in her father; Ovarian cancer in her mother; Stroke in her brother. ?Social History:  ?Patient  reports that she has been smoking cigarettes. She has a 14.40 pack-year smoking history. She has never used smokeless tobacco. She reports current alcohol use of about 4.0 standard drinks per week. She reports that she does not use drugs. ? ?Review of Systems: ?Constitutional: + for fever malaise or anorexia ?Cardiovascular: negative for chest pain ?Respiratory: negative for SOB or persistent cough ?Gastrointestinal: + for  abdominal pain ? ?Objective  ?Vitals: BP 102/67   Pulse 62   Temp (!) 96.9 ?F (36.1 ?C) (Temporal)   Ht '5\' 7"'$  (1.702 m)   Wt 188 lb 12.8 oz (85.6 kg)   LMP  (LMP Unknown)   SpO2 97%   BMI 29.57 kg/m?  ?General: Nontoxic but appears weak ?HEENT: PEERL, conjunctiva normal, neck is supple ?Cardiovascular:  RRR without murmur or gallop.  ?Respiratory:  Good breath sounds bilaterally, CTAB with normal respiratory effort ?Abdomen: Mild right upper quadrant and midepigastric tenderness without rebound or guarding or masses.  No lower abdominal tenderness.  No CVA tenderness.  Normal bowel sounds.  Soft ?Skin:  Warm, no rashes ? ?No visits with results within 1 Day(s) from this visit.  ?Latest known visit with results is:  ?Admission on 02/25/2022, Discharged on 02/25/2022  ?Component Date Value Ref Range Status  ? WBC 02/25/2022 9.1  4.0 - 10.5 K/uL Final  ? RBC 02/25/2022 3.99  3.87 - 5.11 MIL/uL Final  ? Hemoglobin 02/25/2022 12.5  12.0 - 15.0 g/dL Final  ? HCT 02/25/2022 35.7 (L)  36.0 - 46.0 % Final  ? MCV 02/25/2022 89.5  80.0 - 100.0 fL Final  ? MCH 02/25/2022 31.3  26.0 - 34.0 pg Final  ? MCHC 02/25/2022 35.0  30.0 - 36.0 g/dL Final  ? RDW  02/25/2022 12.5  11.5 - 15.5 % Final  ? Platelets 02/25/2022 229  150 - 400 K/uL Final  ? nRBC 02/25/2022 0.0  0.0 - 0.2 % Final  ? Neutrophils Relative % 02/25/2022 89  % Final  ? Neutro Abs 02/25/2022 8.1 (H)  1.7

## 2022-03-01 NOTE — Progress Notes (Signed)
Please call patient: I have reviewed his/her lab results. The ultrasound shows gallstones but no infection of the gallbladder.  ?I am hoping that with holding the lisinopril and eating better, she will feel better.  ?Let us know if things do not improve or worsen.

## 2022-03-03 LAB — CULTURE, BLOOD (ROUTINE X 2)
Culture: NO GROWTH
Culture: NO GROWTH

## 2022-03-14 ENCOUNTER — Telehealth: Payer: Self-pay | Admitting: Pharmacist

## 2022-03-14 NOTE — Progress Notes (Signed)
? ? ?Chronic Care Management ?Pharmacy Assistant  ? ?Name: Jessica Curry  MRN: [id number] DOB: 1951-10-07 ? ? ?Reason for Encounter: General Adherence Call ?  ? ?Recent office visits:  ?02/28/2022 [facility] (PCP) Leamon Arnt, MD; Change Bactrim to once daily prophylactic dosing, Will hold lisinopril until eating and drinking better.  Monitor blood pressures at home. ? ?02/12/2022 OV (PCP) Leamon Arnt, MD; Start Cipro,  Recommend Crestor 40 nightly. ? ?12/29/2021 OV (PCP) Leamon Arnt, MD; please increase dose of atorvastatin to '40mg'$  nighlty  ? ?11/24/2021 [facility] (Family Medicine) Vivi Barrack, MD;  We will treat as community-acquired pneumonia with course of azithromycin and Omnicef.  Encourage good oral hydration.  She can continue her home medications for her COPD/asthma.  We discussed starting prednisone at this point however she deferred. ? ?10/23/2021 [facility] (PCP) Leamon Arnt, MD; hold lisinopril for 2 days ? ?Recent consult visits:  ?None ? ?Hospital visits:  ?02/25/2022 ED visit for Febrile illness ?-No medication changes indicated. ? ?Medications: ?Outpatient Encounter Medications as of 03/14/2022  ?Medication Sig Note  ? albuterol (VENTOLIN HFA) 108 (90 Base) MCG/ACT inhaler USE 2 INHALATIONS BY MOUTH EVERY 6 HOURS AS NEEDED FOR WHEEZING  OR SHORTNESS OF BREATH   ? ALPRAZolam (XANAX) 0.5 MG tablet TAKE ONE TABLET BY MOUTH DAILY AS NEEDED FOR ANXIETY   ? amLODipine (NORVASC) 5 MG tablet Take 1 tablet (5 mg total) by mouth daily.   ? aspirin 81 MG tablet Take 81 mg by mouth at bedtime.   ? atorvastatin (LIPITOR) 40 MG tablet Take 1 tablet (40 mg total) by mouth at bedtime.   ? cloNIDine (CATAPRES) 0.1 MG tablet Take 1 tablet (0.1 mg total) by mouth 3 (three) times daily.   ? diphenhydrAMINE (BENADRYL) 25 MG tablet Take 25 mg by mouth every 6 (six) hours as needed for allergies or sleep.   ? fluocinonide (LIDEX) 0.05 % external solution Apply 1 application topically daily.   ? ketorolac (ACULAR) 0.5 %  ophthalmic solution Place 1 drop into the right eye 4 (four) times daily. (Patient not taking: Reported on 02/28/2022)   ? lisinopril (ZESTRIL) 40 MG tablet Take 1 tablet (40 mg total) by mouth daily.   ? meloxicam (MOBIC) 15 MG tablet Take 1 tablet (15 mg total) by mouth daily.   ? metFORMIN (GLUCOPHAGE) 1000 MG tablet Take 0.5 tablets (500 mg total) by mouth 2 (two) times daily with a meal.   ? Multiple Vitamins-Minerals (MULTIVITAMIN WITH MINERALS) tablet Take 1 tablet by mouth daily. Centrum silver for women   ? nitroGLYCERIN (NITROSTAT) 0.4 MG SL tablet Place 1 tablet (0.4 mg total) under the tongue every 5 (five) minutes as needed for chest pain. 05/10/2021: Needs refill  ? promethazine (PHENERGAN) 25 MG tablet Take 1 tablet (25 mg total) by mouth every 6 (six) hours as needed for nausea or vomiting.   ? sulfamethoxazole-trimethoprim (BACTRIM DS) 800-160 MG tablet Take 1 tablet by mouth daily.   ? venlafaxine XR (EFFEXOR-XR) 75 MG 24 hr capsule TAKE THREE CAPSULES BY MOUTH DAILY WITH BREAKFAST   ? ?No facility-administered encounter medications on file as of 03/14/2022.  ? ?Patient Questions: ?Have you had any problems recently with your health? ?Patient denies having any problems recently with her health. ? ?Have you had any problems with your pharmacy? ?Patient denies having any problems with her pharmacy. ? ?What issues or side effects are you having with your medications? ?Patient denies having any issues or side effects  with any of her medications. ? ?What would you like me to pass along to Leata Mouse, CPP for him to help you with?  ?Patient does not have anything for me to pass along at this time. ? ?What can we do to take care of you better? ?Patient does not have any suggestions at this time. ? ?Care Gaps: ?Medicare Annual Wellness: Completed 05/2021 ?Ophthalmology Exam: Due since 03/06/2022 ?Foot Exam: Next due on 12/29/2022 ?Hemoglobin A1C: 6.2% on 12/29/2021 ?Colonoscopy: Next due on 08/13/2023 ?Dexa Scan:  Next due on 08/25/2022 ?Mammogram: Next due on 02/08/2023 ? ?Future Appointments  ?Date Time Provider [place]  ?03/29/2022 11:00 AM Leamon Arnt, MD [facility]  ?06/01/2022  8:45 AM [facility] HEALTH COACH [facility]  ? ?Star Rating Drugs: ?Atorvastatin 20 mg last filled 02/13/2022 90 DS ?Lisinopril 40 mg last filled 12/27/2021 90 DS ?Metformin 1000 mg last filled 11/03/2021 90 DS ? ?April D Calhoun, [place] ?Clinical Pharmacist Assistant ?[phone] ?

## 2022-03-28 ENCOUNTER — Ambulatory Visit (INDEPENDENT_AMBULATORY_CARE_PROVIDER_SITE_OTHER)
Admission: RE | Admit: 2022-03-28 | Discharge: 2022-03-28 | Disposition: A | Discharge status: Home / Self Care | Payer: Medicare Other | Source: Ambulatory Visit | Attending: Acute Care | Admitting: Acute Care

## 2022-03-28 DIAGNOSIS — F17210 Nicotine dependence, cigarettes, uncomplicated: Secondary | ICD-10-CM

## 2022-03-28 DIAGNOSIS — Z87891 Personal history of nicotine dependence: Secondary | ICD-10-CM | POA: Diagnosis not present

## 2022-03-28 DIAGNOSIS — J439 Emphysema, unspecified: Secondary | ICD-10-CM | POA: Diagnosis not present

## 2022-03-29 ENCOUNTER — Encounter: Payer: Self-pay | Admitting: Family Medicine

## 2022-03-29 ENCOUNTER — Ambulatory Visit (INDEPENDENT_AMBULATORY_CARE_PROVIDER_SITE_OTHER): Payer: Medicare Other | Admitting: Family Medicine

## 2022-03-29 VITALS — BP 120/70 | HR 70 | Temp 98.1°F | Ht 67.0 in | Wt 189.8 lb

## 2022-03-29 DIAGNOSIS — E1121 Type 2 diabetes mellitus with diabetic nephropathy: Secondary | ICD-10-CM | POA: Diagnosis not present

## 2022-03-29 DIAGNOSIS — I152 Hypertension secondary to endocrine disorders: Secondary | ICD-10-CM

## 2022-03-29 DIAGNOSIS — E1159 Type 2 diabetes mellitus with other circulatory complications: Secondary | ICD-10-CM | POA: Diagnosis not present

## 2022-03-29 DIAGNOSIS — N1831 Chronic kidney disease, stage 3a: Secondary | ICD-10-CM | POA: Diagnosis not present

## 2022-03-29 LAB — POCT GLYCOSYLATED HEMOGLOBIN (HGB A1C): Hemoglobin A1C: 5.7 % — AB (ref 4.0–5.6)

## 2022-03-29 LAB — MICROALBUMIN / CREATININE URINE RATIO
Creatinine,U: 113.7 mg/dL
Microalb Creat Ratio: 124.3 mg/g — ABNORMAL HIGH (ref 0.0–30.0)
Microalb, Ur: 141.4 mg/dL — ABNORMAL HIGH (ref 0.0–1.9)

## 2022-03-29 MED ORDER — CYCLOBENZAPRINE HCL 10 MG PO TABS: 10.0000 mg | ORAL_TABLET | Freq: Every evening | ORAL | 0 refills | Status: DC | PRN

## 2022-03-29 NOTE — Progress Notes (Signed)
? ? ?Subjective  ?CC:  ?Chief Complaint  ?Patient presents with  ? Diabetes  ?  Pt her to F/U with Dm  ? ? ?HPI: Jessica Curry is a 71 y.o. female who presents to the office today for follow up of diabetes and problems listed above in the chief complaint.  ?Diabetes follow up: Her diabetic control is reported as Improved. On met 500 bid.  She denies exertional CP or SOB or symptomatic hypoglycemia. She denies foot sores or paresthesias.  ?Bp is controlled. No lows. Compliant with meds. ?Neck pain: on right. Anxiety and stress due to caring for elderly mother 24/7. No radicular sxs. Using tylenol and rare advil.  ? ?Wt Readings from Last 3 Encounters:  ?03/29/22 189 lb 12.8 oz (86.1 kg)  ?02/28/22 188 lb 12.8 oz (85.6 kg)  ?02/25/22 190 lb (86.2 kg)  ? ? ?BP Readings from Last 3 Encounters:  ?03/29/22 120/70  ?02/28/22 102/67  ?02/25/22 140/90  ? ? ?Assessment  ?1. Type 2 diabetes mellitus with nephropathy ([place])   ?2. Hypertension associated with diabetes ([place])   ?3. Chronic kidney disease, stage 3a ([place])   ? ?  ?Plan  ?Diabetes is currently very well controlled. Cont met 500 bid. Check urine MAC.  ?HTN is controlled.  ?CKD: monitor. Consider farxiga.  ?Anxiety neck pain: OA and stressors/muscle pain, heat, stretching tylenol and flexeril at night.  ? ?Follow up: 3 mo for diabetes.Marland Kitchen ?Orders Placed This Encounter  ?Procedures  ? Microalbumin / creatinine urine ratio  ? POCT HgB A1C  ? ?Meds ordered this encounter  ?Medications  ? cyclobenzaprine (FLEXERIL) 10 MG tablet  ?  Sig: Take 1 tablet (10 mg total) by mouth at bedtime as needed for muscle spasms.  ?  Dispense:  30 tablet  ?  Refill:  0  ? ?  ? ?Immunization History  ?Administered Date(s) Administered  ? Fluad Quad(high Dose 65+) 08/05/2019, 08/18/2020, 08/09/2021  ? Influenza, High Dose Seasonal PF 08/20/2017, 08/06/2018  ? Influenza-Unspecified 09/24/2016  ? PFIZER Comirnaty(Gray Top)Covid-19 Tri-Sucrose Vaccine 04/10/2021  ? PFIZER(Purple Top)SARS-COV-2  Vaccination 12/15/2019, 01/05/2020  ? Pension scheme manager 52yr & up 09/04/2021  ? Pneumococcal Conjugate-13 09/10/2016  ? Pneumococcal Polysaccharide-23 12/02/2017  ? Tdap 08/20/2018, 12/05/2018  ? Zoster Recombinat (Shingrix) 06/05/2017, 09/18/2017  ? ? ?Diabetes Related Lab Review: ?Lab Results  ?Component Value Date  ? HGBA1C 5.7 (A) 03/29/2022  ? HGBA1C 6.2 12/29/2021  ? HGBA1C 6.6 (H) 05/10/2021  ?  ?No results found for: MDerl [place]?Lab Results  ?Component Value Date  ? CREATININE 1.43 (H) 02/25/2022  ? BUN 19 02/25/2022  ? NA 128 (L) 02/25/2022  ? K 4.1 02/25/2022  ? CL 100 02/25/2022  ? CO2 20 (L) 02/25/2022  ? ?Lab Results  ?Component Value Date  ? CHOL 180 12/29/2021  ? CHOL 158 12/26/2020  ? CHOL 144 09/15/2020  ? ?Lab Results  ?Component Value Date  ? HDL 47.60 12/29/2021  ? HDL 43.10 12/26/2020  ? HDL 39 (L) 09/15/2020  ? ?Lab Results  ?Component Value Date  ? LHemet78 09/15/2020  ? ?Lab Results  ?Component Value Date  ? TRIG 299.0 (H) 12/29/2021  ? TRIG 226.0 (H) 12/26/2020  ? TRIG 174 (H) 09/15/2020  ? ?Lab Results  ?Component Value Date  ? CHOLHDL 4 12/29/2021  ? CHOLHDL 4 12/26/2020  ? CHOLHDL 3.7 09/15/2020  ? ?Lab Results  ?Component Value Date  ? LDLDIRECT 94.0 12/29/2021  ? LDLDIRECT 96.0 12/26/2020  ?  LDLDIRECT 96.0 09/17/2019  ? ?The 10-year ASCVD risk score (Arnett DK, et al., 2019) is: 31.3% ?  Values used to calculate the score: ?    Age: 18 years ?    Sex: Female ?    Is Non-Hispanic African American: No ?    Diabetic: Yes ?    Tobacco smoker: Yes ?    Systolic Blood Pressure: 998 mmHg ?    Is BP treated: Yes ?    HDL Cholesterol: 47.6 mg/dL ?    Total Cholesterol: 180 mg/dL ?I have reviewed the [place], Fam and Soc history. ?Patient Active Problem List  ? Diagnosis Date Noted  ? 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  Priority: High  ? Simple chronic bronchitis ([place]) 02/11/2018  ?  Priority: High  ?  SPIRO NML 01/2018  ? ?  ? Hyperlipidemia associated  with type 2 diabetes mellitus ([place]) 06/04/2017  ?  Priority: High  ?  Atorvastatin 21m MWF ? ?  ? Type 2 diabetes mellitus with nephropathy ([place] 09/10/2016  ?  Priority: High  ? Hypertension associated with diabetes ([place] 09/10/2016  ?  Priority: High  ? Nicotine dependence, cigarettes, w unsp disorders 09/10/2016  ?  Priority: High  ?  lung cancer screening- 3/4 PPD- since age [age]prior 2 PPD. 1 PPD. Cut down since heart attack.  ? ?  ? Coronary artery disease of native artery of native heart with stable angina pectoris ([place]   ?  Priority: High  ?  [place].  Statin.  Dr. SMarlou Porchnow- previouslyNo cardiology since 2012 due to insurance issues.  ?Dr. RMarijo Filestent with [facility] in [place]. Cobalt chromium stent 06/05/2011- toPCI/bare metal to mid circumflex. Has not seen him since.  ? ?  ? Depression, major, recurrent, in partial remission ([place]   ?  Priority: High  ?  reasonable control on venlafaxine 769mTID. xanax #14 lasts about 2 years for anxiety portion.  ? ?  ? Cholelithiasis 10/04/2021  ?  Priority: Medium   ?  RUQ ultrasound: biliary sludge and stones ? ?  ? Claudication ([facility]10/01/2021  ?  Priority: Medium   ? History of sepsis 05/11/2021  ?  Priority: Medium   ?  2022; unclear source ? ?  ? Chronic kidney disease, stage 3a ([facility]03/15/2022  ?  Priority: Medium   ? Chronic hyponatremia 02/07/2021  ?  Priority: Medium   ? Bilateral renal cysts 02/06/2021  ?  Priority: Medium   ?  Renal ultrasound 4/20200 bilateral cysts; rec repeat in 6 months to recheck.  ?F/u MRI 01/2022: benign exophytic renal cyst. No further surveillance recommended ? ?  ? Hyponatremia 01/02/2021  ?  Priority: Medium   ?  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 ? ?  ? Chronic midline low back pain without sciatica 12/26/2020  ?  Priority: Medium   ?  Lumbar CT 2022: DJD changes, some disc protrusions and mild central canal stenosis. ? ?  ? Serrated polyp of colon 08/12/2018  ?  Priority: Medium   ?  Sessile serrated polyp 2019 ? ?  ?  Osteopenia 06/10/2017  ?  Priority: Medium   ?  Dexa: 07/2020 stable osteopenia, 06/10/17: osteopenia ? ?  ? Encounter for screening for lung cancer 02/10/2020  ?  Priority: Low  ?  Annual by chest CT; h/o benign appearing nodules.  ? ?  ? Mild intermittent asthma 08/06/2019  ?  Priority: Low  ? ? ?Social History: ?  Patient  reports that she has been smoking cigarettes. She has a 14.40 pack-year smoking history. She has never used smokeless tobacco. She reports current alcohol use of about 4.0 standard drinks per week. She reports that she does not use drugs. ? ?Review of Systems: ?Ophthalmic: negative for eye pain, loss of vision or double vision ?Cardiovascular: negative for chest pain ?Respiratory: negative for SOB or persistent cough ?Gastrointestinal: negative for abdominal pain ?Genitourinary: negative for dysuria or gross hematuria ?MSK: negative for foot lesions ?Neurologic: negative for weakness or gait disturbance ? ?Objective  ?Vitals: BP 120/70   Pulse 70   Temp 98.1 ?F (36.7 ?C)   Ht _0  (1.702 m)   Wt 189 lb 12.8 oz (86.1 kg)   LMP  (LMP Unknown)   SpO2 96%   BMI 29.73 kg/m?  ?General: well appearing, no acute distress  ?Psych:  Alert and oriented, normal mood and affect ?HEENT:  Normocephalic, atraumatic, moist mucous membranes, supple neck tender right trap ?Cardiovascular:  Nl S1 and S2, RRR without murmur, gallop or rub. no edema ?Respiratory:  Good breath sounds bilaterally, CTAB with normal effort, no rales ? ? ? ? ?Diabetic education: ongoing education regarding chronic disease management for diabetes was given today. We continue to reinforce the ABC's of diabetic management: A1c (<7 or 8 dependent upon patient), tight blood pressure control, and cholesterol management with goal LDL < 100 minimally. We discuss diet strategies, exercise recommendations, medication options and possible side effects. At each visit, we review recommended immunizations and preventive care recommendations for  diabetics and stress that good diabetic control can prevent other problems. See below for this patient's data. ? ? ?Commons side effects, risks, benefits, and alternatives for medications and treatment plan prescribed t

## 2022-03-29 NOTE — Patient Instructions (Signed)
Please return in 3 months for diabetes follow up  ? ?I'll check your urine today. Your cholesterol looked pretty good in February. Your diabetic control is great! ? ?If you have any questions or concerns, please don't hesitate to send me a message via MyChart or call the office at [phone]. Thank you for visiting with [place] today! It's our pleasure caring for you.  ? ? ?

## 2022-03-30 ENCOUNTER — Other Ambulatory Visit: Payer: Self-pay

## 2022-03-30 DIAGNOSIS — Z122 Encounter for screening for malignant neoplasm of respiratory organs: Secondary | ICD-10-CM

## 2022-03-30 DIAGNOSIS — F17210 Nicotine dependence, cigarettes, uncomplicated: Secondary | ICD-10-CM

## 2022-03-30 DIAGNOSIS — Z87891 Personal history of nicotine dependence: Secondary | ICD-10-CM

## 2022-03-30 NOTE — Telephone Encounter (Signed)
Erroneous encounter - please disregard.

## 2022-05-01 ENCOUNTER — Other Ambulatory Visit: Payer: Self-pay | Admitting: Family Medicine

## 2022-05-28 ENCOUNTER — Telehealth: Payer: Self-pay | Admitting: Family Medicine

## 2022-05-28 NOTE — Telephone Encounter (Signed)
Jessica Curry called to confirm her appointment this week. She was hoping it was with Dr. Jonni Sanger, but I advised her that it was with our Annual Wellness nurse. She wants Dr. Jonni Sanger to know that she is a bit shaky because her mother just passed away.

## 2022-05-30 NOTE — Telephone Encounter (Signed)
Pt is scheduled for 07/07 with Dr. Jonni Sanger.

## 2022-05-31 ENCOUNTER — Telehealth: Payer: Self-pay | Admitting: Pharmacist

## 2022-05-31 NOTE — Progress Notes (Signed)
Chronic Care Management Pharmacy Assistant   Name: Jessica Curry  MRN: [id number] DOB: 04/15/1951   Reason for Encounter: Hypertension Adherence Call    Recent office visits:  03/29/2022 OV (PCP) Leamon Arnt, MD; no medication changes indicated.  Recent consult visits:  None since last Adherence Call  Hospital visits:  None since last Adherence Call  Medications: Outpatient Encounter Medications as of 05/31/2022  Medication Sig Note   albuterol (VENTOLIN HFA) 108 (90 Base) MCG/ACT inhaler USE 2 INHALATIONS BY MOUTH EVERY 6 HOURS AS NEEDED FOR WHEEZING  OR SHORTNESS OF BREATH    ALPRAZolam (XANAX) 0.5 MG tablet TAKE ONE TABLET BY MOUTH DAILY AS NEEDED FOR ANXIETY    amLODipine (NORVASC) 5 MG tablet Take 1 tablet (5 mg total) by mouth daily.    aspirin 81 MG tablet Take 81 mg by mouth at bedtime.    atorvastatin (LIPITOR) 40 MG tablet Take 1 tablet (40 mg total) by mouth at bedtime.    cloNIDine (CATAPRES) 0.1 MG tablet Take 1 tablet (0.1 mg total) by mouth 3 (three) times daily.    cyclobenzaprine (FLEXERIL) 10 MG tablet Take 1 tablet (10 mg total) by mouth at bedtime as needed for muscle spasms.    diphenhydrAMINE (BENADRYL) 25 MG tablet Take 25 mg by mouth every 6 (six) hours as needed for allergies or sleep.    fluocinonide (LIDEX) 0.05 % external solution Apply 1 application topically daily.    ketorolac (ACULAR) 0.5 % ophthalmic solution Place 1 drop into the right eye 4 (four) times daily.    lisinopril (ZESTRIL) 40 MG tablet Take 1 tablet (40 mg total) by mouth daily.    meloxicam (MOBIC) 15 MG tablet Take 1 tablet (15 mg total) by mouth daily.    metFORMIN (GLUCOPHAGE) 1000 MG tablet Take 0.5 tablets (500 mg total) by mouth 2 (two) times daily with a meal.    Multiple Vitamins-Minerals (MULTIVITAMIN WITH MINERALS) tablet Take 1 tablet by mouth daily. Centrum silver for women    nitroGLYCERIN (NITROSTAT) 0.4 MG SL tablet Place 1 tablet (0.4 mg total) under the tongue  every 5 (five) minutes as needed for chest pain. 05/10/2021: Needs refill   promethazine (PHENERGAN) 25 MG tablet Take 1 tablet (25 mg total) by mouth every 6 (six) hours as needed for nausea or vomiting.    sulfamethoxazole-trimethoprim (BACTRIM DS) 800-160 MG tablet Take 1 tablet by mouth daily.    venlafaxine XR (EFFEXOR-XR) 75 MG 24 hr capsule TAKE THREE CAPSULES BY MOUTH DAILY WITH BREAKFAST    No facility-administered encounter medications on file as of 05/31/2022.   Reviewed chart prior to disease state call. Spoke with patient regarding BP  Recent Office Vitals: BP Readings from Last 3 Encounters:  03/29/22 120/70  02/28/22 102/67  02/25/22 140/90   Pulse Readings from Last 3 Encounters:  03/29/22 70  02/28/22 62  02/25/22 62    Wt Readings from Last 3 Encounters:  03/29/22 189 lb 12.8 oz (86.1 kg)  02/28/22 188 lb 12.8 oz (85.6 kg)  02/25/22 190 lb (86.2 kg)     Kidney Function Lab Results  Component Value Date/Time   CREATININE 1.43 (H) 02/25/2022 05:05 PM   CREATININE 1.26 (H) 12/29/2021 09:25 AM   CREATININE 1.15 (H) 02/01/2021 03:35 PM   CREATININE 1.00 (H) 09/15/2020 10:25 AM   GFR 43.26 (L) 12/29/2021 09:25 AM   GFRNONAA 39 (L) 02/25/2022 05:05 PM   GFRNONAA 49 (L) 02/01/2021 03:35 PM   GFRAA 56 (  L) 02/01/2021 03:35 PM       Latest Ref Rng & Units 02/25/2022    5:05 PM 12/29/2021    9:25 AM 10/23/2021    4:33 PM  BMP  Glucose 70 - 99 mg/dL 144  124  73   BUN 8 - 23 mg/dL '19  19  25   '$ Creatinine 0.44 - 1.00 mg/dL 1.43  1.26  1.44   Sodium 135 - 145 mmol/L 128  136  133   Potassium 3.5 - 5.1 mmol/L 4.1  4.5  5.4   Chloride 98 - 111 mmol/L 100  101  103   CO2 22 - 32 mmol/L '20  29  25   '$ Calcium 8.9 - 10.3 mg/dL 9.9  10.7  9.9     Current antihypertensive regimen:  Amlodipine 5 mg daily Clonidine 0.1 mg three times daily Lisinopril 40 mg daily  How often are you checking your Blood Pressure? several times per month  Current home BP readings:  130/80  What recent interventions/DTPs have been made by any provider to improve Blood Pressure control since last CPP Visit: No recent interventions or DTPs.  Any recent hospitalizations or ED visits since last visit with CPP? No  What diet changes have been made to improve Blood Pressure Control?  Patient states she eats a good variety of foods.  What exercise is being done to improve your Blood Pressure Control?  Patient recently lost her mom. She is currently packing up all of her belongings so she can sell their house.  Adherence Review: Is the patient currently on ACE/ARB medication? Yes Does the patient have >5 day gap between last estimated fill dates? No  Care Gaps: Medicare Annual Wellness: Completed 05/2021 Ophthalmology Exam: Due since 03/06/2022 Foot Exam: Next due on 12/29/2022 Hemoglobin A1C: 6.2% on 12/29/2021 Colonoscopy: Next due on 08/13/2023 Dexa Scan: Next due on 08/25/2022 Mammogram: Next due on 02/08/2023  Future Appointments  Date Time Provider [place]  06/01/2022  9:30 AM Leamon Arnt, MD [facility]   Star Rating Drugs: Atorvastatin 20 mg last filled 05/28/2022 90 DS Lisinopril 40 mg last filled 04/20/2022 90 DS Metformin 1000 mg last filled 03/30/2022 90 DS  April D Calhoun, [place] Pharmacist Assistant [phone]

## 2022-06-01 ENCOUNTER — Ambulatory Visit (INDEPENDENT_AMBULATORY_CARE_PROVIDER_SITE_OTHER): Payer: Medicare Other | Admitting: Family Medicine

## 2022-06-01 ENCOUNTER — Ambulatory Visit: Payer: Medicare Other

## 2022-06-01 ENCOUNTER — Encounter: Payer: Self-pay | Admitting: Family Medicine

## 2022-06-01 VITALS — BP 120/80 | HR 60 | Temp 97.8°F | Ht 67.0 in | Wt 186.6 lb

## 2022-06-01 DIAGNOSIS — Z634 Disappearance and death of family member: Secondary | ICD-10-CM

## 2022-06-01 DIAGNOSIS — F4320 Adjustment disorder, unspecified: Secondary | ICD-10-CM | POA: Diagnosis not present

## 2022-06-01 DIAGNOSIS — F3341 Major depressive disorder, recurrent, in partial remission: Secondary | ICD-10-CM

## 2022-06-01 MED ORDER — ALPRAZOLAM 0.5 MG PO TABS: ORAL_TABLET | ORAL | 0 refills | Status: DC

## 2022-06-01 NOTE — Patient Instructions (Signed)
Please return in 6 weeks for diabetes and recheck anxiety/grief  If you have any questions or concerns, please don't hesitate to send me a message via MyChart or call the office at [phone]. Thank you for visiting with [place] today! It's our pleasure caring for you.

## 2022-06-03 NOTE — Progress Notes (Signed)
Subjective  CC:  Chief Complaint  Patient presents with   Shaking    Pt stated that she has had the shakes for the past 2 weeks and wondering if she has parkinsons. She has been having some balance problems as well. Also, pt stated her mom past a week ago.    HPI: Jessica Curry is a 71 y.o. female who presents to the office today to address the problems listed above in the chief complaint. 57 yo mother passed away 2 weeks ago  Assessment  1. Bereavement reaction   2. Depression, major, recurrent, in partial remission ([place])      Plan  Bereavement counseling done:  continue support counseling through church. Self care measures discussed. Xanax as needed. Stress due to need to move w/in 90 days discussed.  Depression: coping well with bereavement ... Will monitor closely. Continue effexor 225 daily.   Follow up: Return in about 6 weeks (around 07/13/2022) for follow up Diabetes and recheck.  06/07/2022  No orders of the defined types were placed in this encounter.  Meds ordered this encounter  Medications   ALPRAZolam (XANAX) 0.5 MG tablet    Sig: TAKE ONE TABLET BY MOUTH DAILY AS NEEDED FOR ANXIETY    Dispense:  30 tablet    Refill:  0      I reviewed the patients updated PMH, FH, and SocHx.    Patient Active Problem List   Diagnosis Date Noted   History of heart artery stent 02/10/2020    Priority: High   Simple chronic bronchitis ([place]) 02/11/2018    Priority: High   Hyperlipidemia associated with type 2 diabetes mellitus ([place]) 06/04/2017    Priority: High   Type 2 diabetes mellitus with nephropathy ([place]) 09/10/2016    Priority: High   Hypertension associated with diabetes ([place]) 09/10/2016    Priority: High   Nicotine dependence, cigarettes, w unsp disorders 09/10/2016    Priority: High   Coronary artery disease of native artery of native heart with stable angina pectoris ([place])     Priority: High   Depression, major, recurrent, in partial remission ([place])      Priority: High   Cholelithiasis 10/04/2021    Priority: Medium    Claudication ([place]) 08/28/2021    Priority: Medium    History of sepsis 05/11/2021    Priority: Medium    Chronic kidney disease, stage 3a ([place]) 02/07/2021    Priority: Medium    Chronic hyponatremia 02/07/2021    Priority: Medium    Bilateral renal cysts 02/06/2021    Priority: Medium    Hyponatremia 01/02/2021    Priority: Medium    Chronic midline low back pain without sciatica 12/26/2020    Priority: Medium    Serrated polyp of colon 08/12/2018    Priority: Medium    Osteopenia 06/10/2017    Priority: Medium    Encounter for screening for lung cancer 02/10/2020    Priority: Low   Mild intermittent asthma 08/06/2019    Priority: Low   Current Meds  Medication Sig   albuterol (VENTOLIN HFA) 108 (90 Base) MCG/ACT inhaler USE 2 INHALATIONS BY MOUTH EVERY 6 HOURS AS NEEDED FOR WHEEZING  OR SHORTNESS OF BREATH   aspirin 81 MG tablet Take 81 mg by mouth at bedtime.   atorvastatin (LIPITOR) 40 MG tablet Take 1 tablet (40 mg total) by mouth at bedtime.   cloNIDine (CATAPRES) 0.1 MG tablet Take 1 tablet (0.1 mg total) by mouth 3 (three) times daily.  cyclobenzaprine (FLEXERIL) 10 MG tablet Take 1 tablet (10 mg total) by mouth at bedtime as needed for muscle spasms.   diphenhydrAMINE (BENADRYL) 25 MG tablet Take 25 mg by mouth every 6 (six) hours as needed for allergies or sleep.   lisinopril (ZESTRIL) 40 MG tablet Take 1 tablet (40 mg total) by mouth daily.   meloxicam (MOBIC) 15 MG tablet Take 1 tablet (15 mg total) by mouth daily.   metFORMIN (GLUCOPHAGE) 1000 MG tablet Take 0.5 tablets (500 mg total) by mouth 2 (two) times daily with a meal.   Multiple Vitamins-Minerals (MULTIVITAMIN WITH MINERALS) tablet Take 1 tablet by mouth daily. Centrum silver for women   promethazine (PHENERGAN) 25 MG tablet Take 1 tablet (25 mg total) by mouth every 6 (six) hours as needed for nausea or vomiting.    sulfamethoxazole-trimethoprim (BACTRIM DS) 800-160 MG tablet Take 1 tablet by mouth daily.   venlafaxine XR (EFFEXOR-XR) 75 MG 24 hr capsule TAKE THREE CAPSULES BY MOUTH DAILY WITH BREAKFAST   [DISCONTINUED] ALPRAZolam (XANAX) 0.5 MG tablet TAKE ONE TABLET BY MOUTH DAILY AS NEEDED FOR ANXIETY    Allergies: Patient has No Known Allergies. Family History: Patient family history includes AAA (abdominal aortic aneurysm) in her mother; Arthritis in her brother and mother; Dementia in her maternal grandmother; Heart attack in her brother; Hyperlipidemia in her brother; Hypertension in her brother; Hypothyroidism in her mother; Kidney disease in her mother; Other in her father; Ovarian cancer in her mother; Stroke in her brother. Social History:  Patient  reports that she has been smoking cigarettes. She has a 14.40 pack-year smoking history. She has never used smokeless tobacco. She reports current alcohol use of about 4.0 standard drinks of alcohol per week. She reports that she does not use drugs.  Review of Systems: Constitutional: Negative for fever malaise or anorexia Cardiovascular: negative for chest pain Respiratory: negative for SOB or persistent cough Gastrointestinal: negative for abdominal pain  Objective  Vitals: BP 120/80   Pulse 60   Temp 97.8 F (36.6 C)   Ht '5\' 7"'$  (1.702 m)   Wt 186 lb 9.6 oz (84.6 kg)   LMP  (LMP Unknown)   SpO2 99%   BMI 29.23 kg/m  General: no acute distress , A&Ox3 Pysch: normal cognition. Appropriate affect  Commons side effects, risks, benefits, and alternatives for medications and treatment plan prescribed today were discussed, and the patient expressed understanding of the given instructions. Patient is instructed to call or message via MyChart if he/she has any questions or concerns regarding our treatment plan. No barriers to understanding were identified. We discussed Red Flag symptoms and signs in detail. Patient expressed understanding  regarding what to do in case of urgent or emergency type symptoms.  Medication list was reconciled, printed and provided to the patient in AVS. Patient instructions and summary information was reviewed with the patient as documented in the AVS. This note was prepared with assistance of Dragon voice recognition software. Occasional wrong-word or sound-a-like substitutions may have occurred due to the inherent limitations of voice recognition software  This visit occurred during the SARS-CoV-2 public health emergency.  Safety protocols were in place, including screening questions prior to the visit, additional usage of staff PPE, and extensive cleaning of exam room while observing appropriate contact time as indicated for disinfecting solutions.

## 2022-06-05 ENCOUNTER — Other Ambulatory Visit: Payer: Self-pay | Admitting: Family Medicine

## 2022-06-07 ENCOUNTER — Ambulatory Visit (INDEPENDENT_AMBULATORY_CARE_PROVIDER_SITE_OTHER): Payer: Medicare Other

## 2022-06-07 VITALS — BP 130/82 | HR 81 | Temp 97.9°F | Wt 188.8 lb

## 2022-06-07 DIAGNOSIS — Z0000 Encounter for general adult medical examination without abnormal findings: Secondary | ICD-10-CM

## 2022-06-07 NOTE — Progress Notes (Signed)
Subjective:   MARIESHA VENTURELLA is a 71 y.o. female who presents for Medicare Annual (Subsequent) preventive examination.  Review of Systems     Cardiac Risk Factors include: advanced age (>57mn, >>65women);dyslipidemia;diabetes mellitus;hypertension;smoking/ tobacco exposure     Objective:    Today's Vitals   06/07/22 1056  BP: 130/82  Pulse: 81  Temp: 97.9 F (36.6 C)  SpO2: 92%  Weight: 188 lb 12.8 oz (85.6 kg)   Body mass index is 29.57 kg/m.     06/07/2022   11:11 AM 02/25/2022    4:50 PM 05/26/2021    9:06 AM 05/10/2021    4:08 PM 02/06/2021   11:27 AM 09/08/2019   12:38 PM 08/06/2018    3:19 PM  Advanced Directives  Does Patient Have a Medical Advance Directive? No No Yes No Yes No No  Type of AProduction managerof ABlaineLiving will    Does patient want to make changes to medical advance directive?   Yes (MAU/Ambulatory/Procedural Areas - Information given)  No - Patient declined    Would patient like information on creating a medical advance directive? Yes (MAU/Ambulatory/Procedural Areas - Information given) Yes (ED - Information included in AVS)  No - Patient declined  Yes (MAU/Ambulatory/Procedural Areas - Information given) Yes (MAU/Ambulatory/Procedural Areas - Information given)    Current Medications (verified) Outpatient Encounter Medications as of 06/07/2022  Medication Sig   ALPRAZolam (XANAX) 0.5 MG tablet TAKE ONE TABLET BY MOUTH DAILY AS NEEDED FOR ANXIETY   aspirin 81 MG tablet Take 81 mg by mouth at bedtime.   atorvastatin (LIPITOR) 40 MG tablet Take 1 tablet (40 mg total) by mouth at bedtime.   cloNIDine (CATAPRES) 0.1 MG tablet Take 1 tablet (0.1 mg total) by mouth 3 (three) times daily.   cyclobenzaprine (FLEXERIL) 10 MG tablet TAKE ONE TABLET BY MOUTH AT BEDTIME AS NEEDED FOR MUSCLE SPASMS   diphenhydrAMINE (BENADRYL) 25 MG tablet Take 25 mg by mouth every 6 (six) hours as needed for allergies or sleep.   lisinopril (ZESTRIL)  40 MG tablet Take 1 tablet (40 mg total) by mouth daily.   metFORMIN (GLUCOPHAGE) 1000 MG tablet Take 0.5 tablets (500 mg total) by mouth 2 (two) times daily with a meal.   Multiple Vitamins-Minerals (MULTIVITAMIN WITH MINERALS) tablet Take 1 tablet by mouth daily. Centrum silver for women   promethazine (PHENERGAN) 25 MG tablet Take 1 tablet (25 mg total) by mouth every 6 (six) hours as needed for nausea or vomiting.   sulfamethoxazole-trimethoprim (BACTRIM DS) 800-160 MG tablet Take 1 tablet by mouth daily.   venlafaxine XR (EFFEXOR-XR) 75 MG 24 hr capsule TAKE THREE CAPSULES BY MOUTH DAILY WITH BREAKFAST   albuterol (VENTOLIN HFA) 108 (90 Base) MCG/ACT inhaler USE 2 INHALATIONS BY MOUTH EVERY 6 HOURS AS NEEDED FOR WHEEZING  OR SHORTNESS OF BREATH   amLODipine (NORVASC) 5 MG tablet Take 1 tablet (5 mg total) by mouth daily.   fluocinonide (LIDEX) 0.05 % external solution Apply 1 application topically daily. (Patient not taking: Reported on 06/01/2022)   ketorolac (ACULAR) 0.5 % ophthalmic solution Place 1 drop into the right eye 4 (four) times daily. (Patient not taking: Reported on 06/01/2022)   meloxicam (MOBIC) 15 MG tablet Take 1 tablet (15 mg total) by mouth daily. (Patient not taking: Reported on 06/07/2022)   nitroGLYCERIN (NITROSTAT) 0.4 MG SL tablet Place 1 tablet (0.4 mg total) under the tongue every 5 (five) minutes as needed for chest  pain.   No facility-administered encounter medications on file as of 06/07/2022.    Allergies (verified) Patient has no known allergies.   History: Past Medical History:  Diagnosis Date   Allergy    Asthma    Chicken pox    Depression    reasonable control on venlafaxine '75mg'$  TID. xanax #14 lasts about 2 years for anxiety portion.    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 [employer]   Heart attack Brother    Arthritis Brother        hip replacement   Hyperlipidemia Brother    Hypertension Brother    Dementia Maternal Grandmother    Colon cancer Neg Hx    Esophageal cancer Neg Hx    Rectal cancer Neg Hx    Stomach cancer Neg Hx    Social History   Socioeconomic History   Marital status: Divorced    Spouse name: Not on file   Number of children: Not on file   Years of education: Not on file   Highest education level: Not on file  Occupational History   Not on file  Tobacco Use   Smoking status: Every Day    Packs/day: 0.50    Years: 48.00    Total pack years: 24.00    Types: Cigarettes   Smokeless tobacco: Never   Tobacco comments:    handouts provided   Vaping Use   Vaping Use: Never used  Substance and Sexual Activity   Alcohol use: Yes    Alcohol/week: 4.0 standard drinks of alcohol    Types: 4 Cans of beer per week   Drug use: No   Sexual activity: Yes  Other Topics Concern   Not on file  Social History Narrative   Divorced. Did not ask about children.       Lives with mother   BS undergrad.    Now struggles to find work and has financial struggles- getting on medicare has helped.    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 Does not "mow or blow"   Social Determinants of Health   Financial Resource Strain: Low Risk  (06/07/2022)   Overall Financial Resource Strain (CARDIA)    Difficulty of Paying Living Expenses: Not very hard  Food Insecurity: No  Food Insecurity (06/07/2022)   Hunger Vital Sign    Worried About Running Out of Food in the Last Year: Never true    Ran Out of Food in the Last Year: Never true  Transportation Needs: No Transportation Needs (06/07/2022)   PRAPARE - Hydrologist (Medical): No    Lack  of Transportation (Non-Medical): No  Physical Activity: Sufficiently Active (06/07/2022)   Exercise Vital Sign    Days of Exercise per Week: 5 days    Minutes of Exercise per Session: 60 min  Stress: Stress Concern Present (06/07/2022)   [place]    Feeling of Stress : To some extent  Social Connections: Socially Isolated (06/07/2022)   Social Connection and Isolation Panel [NHANES]    Frequency of Communication with Friends and Family: Once a week    Frequency of Social Gatherings with Friends and Family: Once a week    Attends Religious Services: More than 4 times per year    Active Member of [employer] or Organizations: No    Attends Music therapist: Never    Marital Status: Divorced    Tobacco Counseling Ready to quit: Not Answered Counseling given: Not Answered Tobacco comments: handouts provided    Clinical Intake:  Pre-visit preparation completed: Yes  Pain : No/denies pain     BMI - recorded: 29.57 Nutritional Status: BMI 25 -29 Overweight Nutritional Risks: None Diabetes: Yes CBG done?: No Did pt. bring in CBG monitor from home?: No  How often do you need to have someone help you when you read instructions, pamphlets, or other written materials from your doctor or pharmacy?: 1 - Never  Diabetic?Nutrition Risk Assessment:  Has the patient had any N/V/D within the last 2 months?  No  Does the patient have any non-healing wounds?  No  Has the patient had any unintentional weight loss or weight gain?  No   Diabetes:  Is the patient diabetic?  Yes  If diabetic, was a CBG obtained today?  No   Did the patient bring in their glucometer from home?  No  How often do you monitor your CBG's? N/A.   Financial Strains and Diabetes Management:  Are you having any financial strains with the device, your supplies or your medication? No .  Does the patient want to be seen by Chronic Care Management for management of their diabetes?  No  Would the patient like to be referred to a Nutritionist or for Diabetic Management?  No   Diabetic Exams:  Diabetic Eye Exam: Completed 11/09/21 Diabetic Foot Exam: Completed 12/29/21   Interpreter Needed?: No  Information entered by :: Charlott Rakes, LPN   Activities of Daily Living    06/07/2022   11:13 AM  In your present state of health, do you have any difficulty performing the following activities:  Hearing? 0  Vision? 0  Difficulty concentrating or making decisions? 0  Walking or climbing stairs? 1  Comment painful on knees  Dressing or bathing? 0  Doing errands, shopping? 0  Preparing Food and eating ? N  Using the Toilet? N  In the past six months, have you accidently leaked urine? Y  Comment wears a pad  Do you have problems with loss of bowel control? N  Managing your Medications? N  Managing your Finances? N  Housekeeping or managing your Housekeeping? N    Patient Care Team: Leamon Arnt, MD as PCP - General (Family Medicine) Jerline Pain, MD as PCP - Cardiology (Cardiology)  Indicate any recent Medical Services you may have received from other than [facility] providers in the past year (date may be approximate).     Assessment:   This is a routine wellness examination for [facility].  Hearing/Vision screen Hearing Screening - Comments:: Pt denies any hearing issues  Vision Screening - Comments:: Pt follows up with Dr Omelia Blackwater for annual eye exams   Dietary issues and exercise activities discussed: Current Exercise Habits: Home exercise routine, Type of exercise: Other - see comments (yard work and gardening), Time  (Minutes): 60, Frequency (Times/Week): 5, Weekly Exercise (Minutes/Week): 300   Goals Addressed             This Visit's Progress    Patient Stated       None at this time        Depression Screen    06/07/2022   11:07 AM 06/01/2022    1:30 PM 03/29/2022   10:49 AM 12/29/2021   12:47 PM 05/26/2021    9:07 AM 12/26/2020    2:26 PM 09/17/2019    1:54 PM  PHQ 2/9 Scores  PHQ - 2 Score 1 0 5 2 0 4 1  PHQ- 9 Score '5  10 10  7 8    '$ Fall Risk    06/07/2022   11:13 AM 03/29/2022   10:49 AM 02/12/2022    2:14 PM 07/21/2021   11:32 AM 05/26/2021    9:07 AM  Fall Risk   Falls in the past year? 1 1 0 0 1  Number falls in past yr: 1 1 0  1  Injury with Fall? 1 0 0  1  Comment bruised    bruises  Risk for fall due to : Impaired vision;Impaired balance/gait No Fall Risks Impaired balance/gait No Fall Risks   Follow up Falls prevention discussed Falls evaluation completed Falls evaluation completed      FALL RISK PREVENTION PERTAINING TO THE HOME:  Any stairs in or around the home? Yes  If so, are there any without handrails? No  Home free of loose throw rugs in walkways, pet beds, electrical cords, etc? Yes  Adequate lighting in your home to reduce risk of falls? Yes   ASSISTIVE DEVICES UTILIZED TO PREVENT FALLS:  Life alert? No  Use of a cane, walker or w/c? No  Grab bars in the bathroom? No  Shower chair or bench in shower? No  Elevated toilet seat or a handicapped toilet? No   TIMED UP AND GO:  Was the test performed? Yes .  Length of time to ambulate 10 feet: 10 sec.   Gait slow and steady without use of assistive device  Cognitive Function:        06/07/2022   11:16 AM 05/26/2021    9:12 AM 09/08/2019   12:43 PM 08/06/2018    3:47 PM  6CIT Screen  What Year? 0 points 0 points 0 points 0 points  What month? 0 points 0 points 0 points 0 points  What time? 0 points 0 points 0 points 0 points  Count back from 20 0 points 0 points 0 points 0 points  Months in reverse 0  points 0 points 0 points 0 points  Repeat phrase 2 points 0 points 0 points 0 points  Total Score 2 points 0 points 0 points 0 points    Immunizations Immunization History  Administered Date(s) Administered   Fluad Quad(high Dose 65+) 08/05/2019, 08/18/2020, 08/09/2021   Influenza, High Dose Seasonal PF 08/20/2017, 08/06/2018   Influenza-Unspecified 09/24/2016   PFIZER Comirnaty(Gray Top)Covid-19 Tri-Sucrose Vaccine 04/10/2021   PFIZER(Purple Top)SARS-COV-2 Vaccination 12/15/2019, 01/05/2020   Pfizer Covid-19 Vaccine Bivalent Booster 56yr & up 09/04/2021   Pneumococcal Conjugate-13 09/10/2016   Pneumococcal Polysaccharide-23 12/02/2017   Tdap 08/20/2018, 12/05/2018   Zoster Recombinat (  Shingrix) 06/05/2017, 09/18/2017    TDAP status: Up to date  Flu Vaccine status: Up to date  Pneumococcal vaccine status: Up to date  Covid-19 vaccine status: Completed vaccines  Qualifies for Shingles Vaccine? Yes   Zostavax completed Yes   Shingrix Completed?: Yes  Screening Tests Health Maintenance  Topic Date Due   INFLUENZA VACCINE  06/26/2022   DEXA SCAN  08/25/2022   HEMOGLOBIN A1C  09/29/2022   OPHTHALMOLOGY EXAM  11/09/2022   FOOT EXAM  12/29/2022   MAMMOGRAM  02/08/2023   COLONOSCOPY (Pts 45-55yr Insurance coverage will need to be confirmed)  08/13/2023   TETANUS/TDAP  12/05/2028   Pneumonia Vaccine 71 Years old  Completed   COVID-19 Vaccine  Completed   Zoster Vaccines- Shingrix  Completed   Hepatitis C Screening  Addressed   HPV VACCINES  Aged Out    Health Maintenance  There are no preventive care reminders to display for this patient.  Colorectal cancer screening: Type of screening: Colonoscopy. Completed 08/12/18. Repeat every 5 years  Mammogram status: Completed 02/07/22. Repeat every year  Bone Density status: Completed 08/25/20. Results reflect: Bone density results: OSTEOPENIA. Repeat every 2 years.  Lung Cancer Screening: (Low Dose CT Chest recommended if  Age 71-80years, 30 pack-year currently smoking OR have quit w/in 15years.) does qualify.   Lung Cancer Screening Referral: pt stated she currently get every year scanning with trial   Additional Screening:  Hepatitis C Screening: Completed 10/23/16  Vision Screening: Recommended annual ophthalmology exams for early detection of glaucoma and other disorders of the eye. Is the patient up to date with their annual eye exam?  Yes  Who is the provider or what is the name of the office in which the patient attends annual eye exams? Dr BValetta Close If pt is not established with a provider, would they like to be referred to a provider to establish care? No .   Dental Screening: Recommended annual dental exams for proper oral hygiene  Community Resource Referral / Chronic Care Management: CRR required this visit?  No   CCM required this visit?  No      Plan:     I have personally reviewed and noted the following in the patient's chart:   Medical and social history Use of alcohol, tobacco or illicit drugs  Current medications and supplements including opioid prescriptions.  Functional ability and status Nutritional status Physical activity Advanced directives List of other physicians Hospitalizations, surgeries, and ER visits in previous 12 months Vitals Screenings to include cognitive, depression, and falls Referrals and appointments  In addition, I have reviewed and discussed with patient certain preventive protocols, quality metrics, and best practice recommendations. A written personalized care plan for preventive services as well as general preventive health recommendations were provided to patient.     TWillette Brace LPN   [id number]  Nurse Notes: pt is stating she needs refills on smz/tmp ds Bactrim DS 800-'160mg'$  taking 1 tab twice a day. For preventative of UTI

## 2022-06-07 NOTE — Patient Instructions (Signed)
Ms. Jessica Curry , Thank you for taking time to come for your Medicare Wellness Visit. I appreciate your ongoing commitment to your health goals. Please review the following plan we discussed and let me know if I can assist you in the future.   Screening recommendations/referrals: Colonoscopy: done 08/12/18 repeat every 5 years  Mammogram: done 02/07/22 repeat every year  Bone Density: done 08/25/20 repeat every 2 years  Recommended yearly ophthalmology/optometry visit for glaucoma screening and checkup Recommended yearly dental visit for hygiene and checkup  Vaccinations: Influenza vaccine: done 08/09/21 repeat every year  Pneumococcal vaccine: Up to date Tdap vaccine: done 12/05/18 repeat every 10 years  Shingles vaccine: completed   7/11, 09/18/17 Covid-19:Completed 1/19, 01/05/20 & 5/16, 09/04/21  Advanced directives: Advance directive discussed with you today. I have provided a copy for you to complete at home and have notarized. Once this is complete please bring a copy in to our office so we can scan it into your chart.  Conditions/risks identified: None at this time   Next appointment: Follow up in one year for your annual wellness visit    Preventive Care 65 Years and Older, Female Preventive care refers to lifestyle choices and visits with your health care provider that can promote health and wellness. What does preventive care include? A yearly physical exam. This is also called an annual well check. Dental exams once or twice a year. Routine eye exams. Ask your health care provider how often you should have your eyes checked. Personal lifestyle choices, including: Daily care of your teeth and gums. Regular physical activity. Eating a healthy diet. Avoiding tobacco and drug use. Limiting alcohol use. Practicing safe sex. Taking low-dose aspirin every day. Taking vitamin and mineral supplements as recommended by your health care provider. What happens during an annual well  check? The services and screenings done by your health care provider during your annual well check will depend on your age, overall health, lifestyle risk factors, and family history of disease. Counseling  Your health care provider may ask you questions about your: Alcohol use. Tobacco use. Drug use. Emotional well-being. Home and relationship well-being. Sexual activity. Eating habits. History of falls. Memory and ability to understand (cognition). Work and work Statistician. Reproductive health. Screening  You may have the following tests or measurements: Height, weight, and BMI. Blood pressure. Lipid and cholesterol levels. These may be checked every 5 years, or more frequently if you are over 32 years old. Skin check. Lung cancer screening. You may have this screening every year starting at age 68 if you have a 30-pack-year history of smoking and currently smoke or have quit within the past 15 years. Fecal occult blood test (FOBT) of the stool. You may have this test every year starting at age 66. Flexible sigmoidoscopy or colonoscopy. You may have a sigmoidoscopy every 5 years or a colonoscopy every 10 years starting at age 57. Hepatitis C blood test. Hepatitis B blood test. Sexually transmitted disease (STD) testing. Diabetes screening. This is done by checking your blood sugar (glucose) after you have not eaten for a while (fasting). You may have this done every 1-3 years. Bone density scan. This is done to screen for osteoporosis. You may have this done starting at age 60. Mammogram. This may be done every 1-2 years. Talk to your health care provider about how often you should have regular mammograms. Talk with your health care provider about your test results, treatment options, and if necessary, the need for more tests.  Vaccines  Your health care provider may recommend certain vaccines, such as: Influenza vaccine. This is recommended every year. Tetanus, diphtheria, and  acellular pertussis (Tdap, Td) vaccine. You may need a Td booster every 10 years. Zoster vaccine. You may need this after age 33. Pneumococcal 13-valent conjugate (PCV13) vaccine. One dose is recommended after age 59. Pneumococcal polysaccharide (PPSV23) vaccine. One dose is recommended after age 61. Talk to your health care provider about which screenings and vaccines you need and how often you need them. This information is not intended to replace advice given to you by your health care provider. Make sure you discuss any questions you have with your health care provider. Document Released: 12/09/2015 Document Revised: 08/01/2016 Document Reviewed: 09/13/2015 Elsevier Interactive Patient Education  2017 [employer] Prevention in the Home Falls can cause injuries. They can happen to people of all ages. There are many things you can do to make your home safe and to help prevent falls. What can I do on the outside of my home? Regularly fix the edges of walkways and driveways and fix any cracks. Remove anything that might make you trip as you walk through a door, such as a raised step or threshold. Trim any bushes or trees on the path to your home. Use bright outdoor lighting. Clear any walking paths of anything that might make someone trip, such as rocks or tools. Regularly check to see if handrails are loose or broken. Make sure that both sides of any steps have handrails. Any raised decks and porches should have guardrails on the edges. Have any leaves, snow, or ice cleared regularly. Use sand or salt on walking paths during winter. Clean up any spills in your garage right away. This includes oil or grease spills. What can I do in the bathroom? Use night lights. Install grab bars by the toilet and in the tub and shower. Do not use towel bars as grab bars. Use non-skid mats or decals in the tub or shower. If you need to sit down in the shower, use a plastic, non-slip stool. Keep  the floor dry. Clean up any water that spills on the floor as soon as it happens. Remove soap buildup in the tub or shower regularly. Attach bath mats securely with double-sided non-slip rug tape. Do not have throw rugs and other things on the floor that can make you trip. What can I do in the bedroom? Use night lights. Make sure that you have a light by your bed that is easy to reach. Do not use any sheets or blankets that are too big for your bed. They should not hang down onto the floor. Have a firm chair that has side arms. You can use this for support while you get dressed. Do not have throw rugs and other things on the floor that can make you trip. What can I do in the kitchen? Clean up any spills right away. Avoid walking on wet floors. Keep items that you use a lot in easy-to-reach places. If you need to reach something above you, use a strong step stool that has a grab bar. Keep electrical cords out of the way. Do not use floor polish or wax that makes floors slippery. If you must use wax, use non-skid floor wax. Do not have throw rugs and other things on the floor that can make you trip. What can I do with my stairs? Do not leave any items on the stairs. Make sure that  there are handrails on both sides of the stairs and use them. Fix handrails that are broken or loose. Make sure that handrails are as long as the stairways. Check any carpeting to make sure that it is firmly attached to the stairs. Fix any carpet that is loose or worn. Avoid having throw rugs at the top or bottom of the stairs. If you do have throw rugs, attach them to the floor with carpet tape. Make sure that you have a light switch at the top of the stairs and the bottom of the stairs. If you do not have them, ask someone to add them for you. What else can I do to help prevent falls? Wear shoes that: Do not have high heels. Have rubber bottoms. Are comfortable and fit you well. Are closed at the toe. Do not  wear sandals. If you use a stepladder: Make sure that it is fully opened. Do not climb a closed stepladder. Make sure that both sides of the stepladder are locked into place. Ask someone to hold it for you, if possible. Clearly mark and make sure that you can see: Any grab bars or handrails. First and last steps. Where the edge of each step is. Use tools that help you move around (mobility aids) if they are needed. These include: Canes. Walkers. Scooters. Crutches. Turn on the lights when you go into a dark area. Replace any light bulbs as soon as they burn out. Set up your furniture so you have a clear path. Avoid moving your furniture around. If any of your floors are uneven, fix them. If there are any pets around you, be aware of where they are. Review your medicines with your doctor. Some medicines can make you feel dizzy. This can increase your chance of falling. Ask your doctor what other things that you can do to help prevent falls. This information is not intended to replace advice given to you by your health care provider. Make sure you discuss any questions you have with your health care provider. Document Released: 09/08/2009 Document Revised: 04/19/2016 Document Reviewed: 12/17/2014 Elsevier Interactive Patient Education  2017 [employer].

## 2022-06-18 DIAGNOSIS — E119 Type 2 diabetes mellitus without complications: Secondary | ICD-10-CM | POA: Diagnosis not present

## 2022-06-18 DIAGNOSIS — Z961 Presence of intraocular lens: Secondary | ICD-10-CM | POA: Diagnosis not present

## 2022-06-18 LAB — HM DIABETES EYE EXAM

## 2022-06-19 ENCOUNTER — Encounter: Payer: Self-pay | Admitting: Family Medicine

## 2022-06-21 ENCOUNTER — Other Ambulatory Visit: Payer: Self-pay | Admitting: Family Medicine

## 2022-07-16 ENCOUNTER — Encounter: Payer: Self-pay | Admitting: Family Medicine

## 2022-07-16 ENCOUNTER — Ambulatory Visit (INDEPENDENT_AMBULATORY_CARE_PROVIDER_SITE_OTHER): Payer: Medicare Other | Admitting: Family Medicine

## 2022-07-16 VITALS — BP 118/62 | HR 57 | Temp 97.8°F | Ht 67.0 in | Wt 187.6 lb

## 2022-07-16 DIAGNOSIS — E1121 Type 2 diabetes mellitus with diabetic nephropathy: Secondary | ICD-10-CM

## 2022-07-16 DIAGNOSIS — N390 Urinary tract infection, site not specified: Secondary | ICD-10-CM

## 2022-07-16 DIAGNOSIS — E1159 Type 2 diabetes mellitus with other circulatory complications: Secondary | ICD-10-CM

## 2022-07-16 DIAGNOSIS — E871 Hypo-osmolality and hyponatremia: Secondary | ICD-10-CM | POA: Diagnosis not present

## 2022-07-16 DIAGNOSIS — I25118 Atherosclerotic heart disease of native coronary artery with other forms of angina pectoris: Secondary | ICD-10-CM | POA: Diagnosis not present

## 2022-07-16 DIAGNOSIS — E1169 Type 2 diabetes mellitus with other specified complication: Secondary | ICD-10-CM

## 2022-07-16 DIAGNOSIS — N1831 Chronic kidney disease, stage 3a: Secondary | ICD-10-CM

## 2022-07-16 DIAGNOSIS — E785 Hyperlipidemia, unspecified: Secondary | ICD-10-CM | POA: Diagnosis not present

## 2022-07-16 DIAGNOSIS — I152 Hypertension secondary to endocrine disorders: Secondary | ICD-10-CM | POA: Diagnosis not present

## 2022-07-16 LAB — RENAL FUNCTION PANEL
Albumin: 4.1 g/dL (ref 3.5–5.2)
BUN: 20 mg/dL (ref 6–23)
CO2: 24 mEq/L (ref 19–32)
Calcium: 10.4 mg/dL (ref 8.4–10.5)
Chloride: 98 mEq/L (ref 96–112)
Creatinine, Ser: 1.37 mg/dL — ABNORMAL HIGH (ref 0.40–1.20)
GFR: 38.98 mL/min — ABNORMAL LOW (ref >60.00)
Glucose, Bld: 89 mg/dL (ref 70–99)
Phosphorus: 3.9 mg/dL (ref 2.3–4.6)
Potassium: 5.5 mEq/L — ABNORMAL HIGH (ref 3.5–5.1)
Sodium: 130 mEq/L — ABNORMAL LOW (ref 135–145)

## 2022-07-16 LAB — POCT GLYCOSYLATED HEMOGLOBIN (HGB A1C): Hemoglobin A1C: 5.7 % — AB (ref 4.0–5.6)

## 2022-07-16 NOTE — Patient Instructions (Signed)
Please return in 5-6 months for your complete physical.   I will release your lab results to you on your MyChart account with further instructions. You may see the results before I do, but when I review them I will send you a message with my report or have my assistant call you if things need to be discussed. Please reply to my message with any questions. Thank you!   If you have any questions or concerns, please don't hesitate to send me a message via MyChart or call the office at [phone]. Thank you for visiting with [place] today! It's our pleasure caring for you.

## 2022-07-16 NOTE — Progress Notes (Signed)
Subjective  CC:  Chief Complaint  Patient presents with   Diabetes    Pt here to F/U with DM     HPI: Jessica Curry is a 71 y.o. female who presents to the office today for follow up of diabetes and problems listed above in the chief complaint.  Diabetes follow up: Her diabetic control is reported as Unchanged.  She denies exertional CP or SOB or symptomatic hypoglycemia. She denies foot sores or paresthesias.  Renal disease: need to recheck kidney funciton. On ACE. Not on farxiga. No swelling.  HTN controlled on lisinopril 40 and amlodipine 5. No cp Recurrent utis now on daily prophylaxis x 5 months with septra. No UTI sxs. No new infections.  Denies cp.   Wt Readings from Last 3 Encounters:  07/16/22 187 lb 9.6 oz (85.1 kg)  06/07/22 188 lb 12.8 oz (85.6 kg)  06/01/22 186 lb 9.6 oz (84.6 kg)    BP Readings from Last 3 Encounters:  06/07/22 130/82  06/01/22 120/80  03/29/22 120/70    Assessment  1. Type 2 diabetes mellitus with nephropathy ([place])   2. Hypertension associated with diabetes ([place])   3. Hyperlipidemia associated with type 2 diabetes mellitus ([place])   4. Coronary artery disease of native artery of native heart with stable angina pectoris ([place])   5. Chronic kidney disease, stage 3a ([place])   6. Chronic hyponatremia   7. Recurrent UTI      Plan  Diabetes is currently very well controlled. On met 500 bid however, with neprhopathy and proteinuria, would like her to be on farxiga. Will recheck renal function and change if persists. Discussed with pt today. Hopefuly cost won't be barrier.  Bp is controlled on clonidine 0.1 tid and lisinopril 40.  Monitoring hyponatremia, chornic.  Continue daily septra.   Follow up: January 2024 for cpe and recheck. Orders Placed This Encounter  Procedures   Renal function panel   POCT HgB A1C   No orders of the defined types were placed in this encounter.     Immunization History  Administered Date(s) Administered    Fluad Quad(high Dose 65+) 08/05/2019, 08/18/2020, 08/09/2021   Influenza, High Dose Seasonal PF 08/20/2017, 08/06/2018   Influenza-Unspecified 09/24/2016   PFIZER Comirnaty(Gray Top)Covid-19 Tri-Sucrose Vaccine 04/10/2021   PFIZER(Purple Top)SARS-COV-2 Vaccination 12/15/2019, 01/05/2020   Pfizer Covid-19 Vaccine Bivalent Booster 74yr & up 09/04/2021   Pneumococcal Conjugate-13 09/10/2016   Pneumococcal Polysaccharide-23 12/02/2017   Tdap 08/20/2018, 12/05/2018   Zoster Recombinat (Shingrix) 06/05/2017, 09/18/2017    Diabetes Related Lab Review: Lab Results  Component Value Date   HGBA1C 5.7 (A) 07/16/2022   HGBA1C 5.7 (A) 03/29/2022   HGBA1C 6.2 12/29/2021    Lab Results  Component Value Date   MICROALBUR 141.4 (H) 03/29/2022   Lab Results  Component Value Date   CREATININE 1.43 (H) 02/25/2022   BUN 19 02/25/2022   NA 128 (L) 02/25/2022   K 4.1 02/25/2022   CL 100 02/25/2022   CO2 20 (L) 02/25/2022   Lab Results  Component Value Date   CHOL 180 12/29/2021   CHOL 158 12/26/2020   CHOL 144 09/15/2020   Lab Results  Component Value Date   HDL 47.60 12/29/2021   HDL 43.10 12/26/2020   HDL 39 (L) 09/15/2020   Lab Results  Component Value Date   LDLCALC 78 09/15/2020   Lab Results  Component Value Date   TRIG 299.0 (H) 12/29/2021   TRIG 226.0 (H) 12/26/2020   TRIG  174 (H) 09/15/2020   Lab Results  Component Value Date   CHOLHDL 4 12/29/2021   CHOLHDL 4 12/26/2020   CHOLHDL 3.7 09/15/2020   Lab Results  Component Value Date   LDLDIRECT 94.0 12/29/2021   LDLDIRECT 96.0 12/26/2020   LDLDIRECT 96.0 09/17/2019   The 10-year ASCVD risk score (Arnett DK, et al., 2019) is: 38.2%   Values used to calculate the score:     Age: 35 years     Sex: Female     Is Non-Hispanic African American: No     Diabetic: Yes     Tobacco smoker: Yes     Systolic Blood Pressure: 038 mmHg     Is BP treated: Yes     HDL Cholesterol: 47.6 mg/dL     Total Cholesterol: 180  mg/dL I have reviewed the PMH, Fam and Soc history. Patient Active Problem List   Diagnosis Date Noted   Chronic kidney disease, stage 3a ([place]) 02/07/2021    Priority: High    Renal ultrasound: mild increased echogenecity c/w medicorenal disease. Renal cysts w/ benign appearing f/u MRI    History of heart artery stent 02/10/2020    Priority: High   Simple chronic bronchitis ([place]) 02/11/2018    Priority: High    SPIRO NML 01/2018     Hyperlipidemia associated with type 2 diabetes mellitus ([place]) 06/04/2017    Priority: High    Atorvastatin 64m MWF    Type 2 diabetes mellitus with nephropathy ([place] 09/10/2016    Priority: High   Hypertension associated with diabetes ([place] 09/10/2016    Priority: High   Nicotine dependence, cigarettes, w unsp disorders 09/10/2016    Priority: High    lung cancer screening- 3/4 PPD- since age [age]prior 2 PPD. 1 PPD. Cut down since heart attack.     Coronary artery disease of native artery of native heart with stable angina pectoris ([facility]     Priority: High    [place].  Statin.  Dr. SMarlou Porchnow- previouslyNo cardiology since 2012 due to insurance issues.  Dr. RMarijo Filestent with [facility] in [place]. Cobalt chromium stent 06/05/2011- toPCI/bare metal to mid circumflex. Has not seen him since.     Depression, major, recurrent, in partial remission ([place]     Priority: High    reasonable control on venlafaxine 743mTID. xanax #14 lasts about 2 years for anxiety portion.     Cholelithiasis 10/04/2021    Priority: Medium     RUQ ultrasound: biliary sludge and stones    Claudication ([place]/01/2021    Priority: Medium    History of sepsis 05/11/2021    Priority: Medium     2022; unclear source    Chronic hyponatremia 02/07/2021    Priority: Medium    Recurrent UTI 02/06/2021    Priority: Medium     Septra prophylaxis started march 2023    Bilateral renal cysts 02/06/2021    Priority: Medium     Renal ultrasound 4/20200 bilateral cysts; rec  repeat in 6 months to recheck.  F/u MRI 01/2022: benign exophytic renal cyst. 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    Serrated polyp of colon 08/12/2018    Priority: Medium  Sessile serrated polyp 2019    Osteopenia 06/10/2017    Priority: Medium     Dexa: 07/2020 stable osteopenia, 06/10/17: osteopenia     Encounter for screening for lung cancer 02/10/2020    Priority: Low    Annual by chest CT; h/o benign appearing nodules.     Mild intermittent asthma 08/06/2019    Priority: Low    Social History: Patient  reports that she has been smoking cigarettes. She has a 24.00 pack-year smoking history. She has never used smokeless tobacco. She reports current alcohol use of about 4.0 standard drinks of alcohol per week. She reports that she does not use drugs.  Review of Systems: Ophthalmic: negative for eye pain, loss of vision or double vision Cardiovascular: negative for chest pain Respiratory: negative for SOB or persistent cough Gastrointestinal: negative for abdominal pain Genitourinary: negative for dysuria or gross hematuria MSK: negative for foot lesions Neurologic: negative for weakness or gait disturbance  Objective  Vitals: Temp 97.8 F (36.6 C)   Ht 5' 7"  (1.702 m)   Wt 187 lb 9.6 oz (85.1 kg)   LMP  (LMP Unknown)   SpO2 97%   BMI 29.38 kg/m  General: well appearing, no acute distress  Psych:  Alert and oriented, normal mood and affect HEENT:  Normocephalic, atraumatic, moist mucous membranes, supple neck  Cardiovascular:  Nl S1 and S2, RRR without murmur, gallop or rub. no edema Respiratory:  Good breath sounds bilaterally, CTAB with normal effort, no rales     Diabetic education: ongoing education  regarding chronic disease management for diabetes was given today. We continue to reinforce the ABC's of diabetic management: A1c (<7 or 8 dependent upon patient), tight blood pressure control, and cholesterol management with goal LDL < 100 minimally. We discuss diet strategies, exercise recommendations, medication options and possible side effects. At each visit, we review recommended immunizations and preventive care recommendations for diabetics and stress that good diabetic control can prevent other problems. See below for this patient's data.   Commons side effects, risks, benefits, and alternatives for medications and treatment plan prescribed today were discussed, and the patient expressed understanding of the given instructions. Patient is instructed to call or message via MyChart if he/she has any questions or concerns regarding our treatment plan. No barriers to understanding were identified. We discussed Red Flag symptoms and signs in detail. Patient expressed understanding regarding what to do in case of urgent or emergency type symptoms.  Medication list was reconciled, printed and provided to the patient in AVS. Patient instructions and summary information was reviewed with the patient as documented in the AVS. This note was prepared with assistance of Dragon voice recognition software. Occasional wrong-word or sound-a-like substitutions may have occurred due to the inherent limitations of voice recognition software  This visit occurred during the SARS-CoV-2 public health emergency.  Safety protocols were in place, including screening questions prior to the visit, additional usage of staff PPE, and extensive cleaning of exam room while observing appropriate contact time as indicated for disinfecting solutions.

## 2022-07-17 NOTE — Progress Notes (Deleted)
Cardiology Office Note:    Date:  07/17/2022   ID:  Jessica Curry, DOB [birth year], MRN [id number]  PCP:  Leamon Arnt, MD   [facility] Providers Cardiologist:  Candee Furbish, MD { Click to update primary MD,subspecialty MD or APP then REFRESH:1}    Referring MD: Leamon Arnt, MD   Chief Complaint: ***  History of Present Illness:    Jessica Curry is a *** 71 y.o. female with a hx of CAD, fatigue, diabetes, tobacco abuse, HTN,   Had circumflex stent in [facility] July 2012.  On the day of admission she did not feel well.  EKG revealed abnormality at PCP and she was taken to ER.  Underwent cardiac catheterization which resulted in stent placement.  She had concerns about chest pain in April 2019.  Clear stress test revealed small defect of mild severity present in basal inferior lateral and inferior location.  Defect nonreversible and consistent with diaphragmatic at continuation, no ischemia noted.  Low risk study.  He established care with our practice 10/2016 with Dr. Marlou Porch after no health care for approximately 5 years after losing her job.  He was last seen in our office on 03/24/2020 by Dr. Marlou Porch at which time she had no specific complaints.  She was advised to follow-up in 1 year.  Today, she is here   Past Medical History:  Diagnosis Date   Allergy    Asthma    Chicken pox    Depression    reasonable control on venlafaxine '75mg'$  TID. xanax #14 lasts about 2 years for anxiety portion.    Diabetes mellitus    Genital warts    no outbreaks since the 80s   History of heart artery stent 02/10/2020   Hyperlipidemia    Hypertension     Past Surgical History:  Procedure Laterality Date   CORONARY ANGIOPLASTY WITH STENT PLACEMENT     2012, [place]   DILATION AND CURETTAGE OF UTERUS     TONSILLECTOMY  1968    Current Medications: No outpatient medications have been marked as taking for the 07/18/22 encounter (Appointment) with Ann Maki, Lanice Schwab, NP.      Allergies:   Patient has no known allergies.   Social History   Socioeconomic History   Marital status: Divorced    Spouse name: Not on file   Number of children: Not on file   Years of education: Not on file   Highest education level: Not on file  Occupational History   Not on file  Tobacco Use   Smoking status: Every Day    Packs/day: 0.50    Years: 48.00    Total pack years: 24.00    Types: Cigarettes   Smokeless tobacco: Never   Tobacco comments:    handouts provided   Vaping Use   Vaping Use: Never used  Substance and Sexual Activity   Alcohol use: Yes    Alcohol/week: 4.0 standard drinks of alcohol    Types: 4 Cans of beer per week   Drug use: No   Sexual activity: Yes  Other Topics Concern   Not on file  Social History Narrative   Divorced. Did not ask about children.       Lives with mother   BS undergrad.    Now struggles to find work and has financial struggles- getting on medicare has helped.    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 Does not "mow or blow"   Social Determinants of Health   Financial Resource Strain: Low Risk  (06/07/2022)   Overall Financial Resource Strain (CARDIA)    Difficulty of Paying Living Expenses: Not very hard  Food Insecurity: No Food Insecurity (06/07/2022)   Hunger Vital Sign    Worried About Running Out of Food in the Last Year: Never true    Ran Out of Food in the Last Year: Never true  Transportation Needs: No Transportation Needs (06/07/2022)   PRAPARE - Hydrologist (Medical): No    Lack of Transportation (Non-Medical): No  Physical Activity: Sufficiently Active (06/07/2022)   Exercise Vital Sign    Days of Exercise per Week: 5 days    Minutes of Exercise per Session: 60 min  Stress: Stress Concern Present (06/07/2022)   [place]     Feeling of Stress : To some extent  Social Connections: Socially Isolated (06/07/2022)   Social Connection and Isolation Panel [NHANES]    Frequency of Communication with Friends and Family: Once a week    Frequency of Social Gatherings with Friends and Family: Once a week    Attends Religious Services: More than 4 times per year    Active Member of [employer] or Organizations: No    Attends Music therapist: Never    Marital Status: Divorced     Family History: The patient's ***family history includes AAA (abdominal aortic aneurysm) in her mother; Arthritis in her brother and mother; Dementia in her maternal grandmother; Heart attack in her brother; Hyperlipidemia in her brother; Hypertension in her brother; Hypothyroidism in her mother; Kidney disease in her mother; Other in her father; Ovarian cancer in her mother; Stroke in her brother. There is no history of Colon cancer, Esophageal cancer, Rectal cancer, or Stomach cancer.  ROS:   Please see the history of present illness.    *** All other systems reviewed and are negative.  Labs/Other Studies Reviewed:    The following studies were reviewed today:  LE Arterial US 04/08/20  Summary:  Right: Resting right ankle-brachial index is within normal range. No  evidence of significant right lower extremity arterial disease. The right  toe-brachial index is abnormal.   Left: Resting left ankle-brachial index is within normal range. No  evidence of significant left lower extremity arterial disease. The left  toe-brachial index is abnormal.     *See table(s) above for measurements and observations.     Lexiscan Myoview 02/25/18  Nuclear stress EF: 58%. There is a small defect of mild severity present in the basal inferolateral and inferiorlocation. The defect is non-reversible and consistent with diaphragmatic attenuation artifact. No ischemia noted. This is a low risk study. The left ventricular ejection fraction is normal  (55-65%). There was no ST segment deviation noted during stress.   Recent Labs: 12/29/2021: TSH 2.91 02/25/2022: ALT 25; Hemoglobin 12.5; Platelets 229 07/16/2022: BUN 20; Creatinine, Ser 1.37; Potassium 5.5 No hemolysis seen; Sodium 130  Recent Lipid Panel    Component Value Date/Time   CHOL 180 12/29/2021 0925   TRIG 299.0 (H) 12/29/2021 0925   HDL 47.60 12/29/2021 0925   CHOLHDL 4 12/29/2021 0925   VLDL 59.8 (H) 12/29/2021 0925   LDLCALC 78 09/15/2020 1025   LDLDIRECT 94.0 12/29/2021 0925     Risk Assessment/Calculations:   {Does this patient have ATRIAL FIBRILLATION?:[phone]}  Physical Exam:    VS:  LMP  (LMP Unknown)     Wt Readings from Last 3 Encounters:  07/16/22 187 lb 9.6 oz (85.1 kg)  06/07/22 188 lb 12.8 oz (85.6 kg)  06/01/22 186 lb 9.6 oz (84.6 kg)     GEN: *** Well nourished, well developed in no acute distress HEENT: Normal NECK: No JVD; No carotid bruits CARDIAC: ***RRR, no murmurs, rubs, gallops RESPIRATORY:  Clear to auscultation without rales, wheezing or rhonchi  ABDOMEN: Soft, non-tender, non-distended MUSCULOSKELETAL:  No edema; No deformity. *** pedal pulses, ***bilaterally SKIN: Warm and dry NEUROLOGIC:  Alert and oriented x 3 PSYCHIATRIC:  Normal affect   EKG:  EKG is *** ordered today.  The ekg ordered today demonstrates ***       Diagnoses:    No diagnosis found. Assessment and Plan:     CAD Hyperlipidemia LDL goal < 70:  Hypertension  {Are you ordering a CV Procedure (e.g. stress test, cath, DCCV, TEE, etc)?   Press F2        :[id number]}   Disposition:  Medication Adjustments/Labs and Tests Ordered: Current medicines are reviewed at length with the patient today.  Concerns regarding medicines are outlined above.  No orders of the defined types were placed in this encounter.  No orders of the defined types were placed in this encounter.   There are no Patient Instructions on file for this visit.    Signed, Emmaline Life, NP  07/17/2022 6:57 PM    [facility]

## 2022-07-18 ENCOUNTER — Other Ambulatory Visit: Payer: Self-pay

## 2022-07-18 ENCOUNTER — Ambulatory Visit: Payer: Medicare Other | Admitting: Nurse Practitioner

## 2022-07-18 DIAGNOSIS — E1121 Type 2 diabetes mellitus with diabetic nephropathy: Secondary | ICD-10-CM

## 2022-07-18 MED ORDER — DAPAGLIFLOZIN PROPANEDIOL 10 MG PO TABS: 10.0000 mg | ORAL_TABLET | Freq: Every day | ORAL | 11 refills | Status: DC

## 2022-07-19 ENCOUNTER — Other Ambulatory Visit: Payer: Self-pay

## 2022-07-19 DIAGNOSIS — E875 Hyperkalemia: Secondary | ICD-10-CM

## 2022-07-24 ENCOUNTER — Other Ambulatory Visit: Payer: Medicare Other

## 2022-07-26 ENCOUNTER — Other Ambulatory Visit (INDEPENDENT_AMBULATORY_CARE_PROVIDER_SITE_OTHER): Payer: Medicare Other

## 2022-07-26 DIAGNOSIS — E875 Hyperkalemia: Secondary | ICD-10-CM | POA: Diagnosis not present

## 2022-07-26 LAB — BASIC METABOLIC PANEL
BUN: 25 mg/dL — ABNORMAL HIGH (ref 6–23)
CO2: 23 mEq/L (ref 19–32)
Calcium: 9.8 mg/dL (ref 8.4–10.5)
Chloride: 101 mEq/L (ref 96–112)
Creatinine, Ser: 1.57 mg/dL — ABNORMAL HIGH (ref 0.40–1.20)
GFR: 33.09 mL/min — ABNORMAL LOW (ref >60.00)
Glucose, Bld: 108 mg/dL — ABNORMAL HIGH (ref 70–99)
Potassium: 5 mEq/L (ref 3.5–5.1)
Sodium: 132 mEq/L — ABNORMAL LOW (ref 135–145)

## 2022-07-31 ENCOUNTER — Encounter: Payer: Self-pay | Admitting: Family Medicine

## 2022-07-31 NOTE — Progress Notes (Signed)
Please call patient: I have reviewed his/her lab results. Her numbers are improved overall. Sodium remains mildly low; potassium is ok and renal function is slightly worsening.  Recommend staying well hydrated and avoid all nsaids (advil, alleve, ibuprofen etc)

## 2022-08-02 ENCOUNTER — Other Ambulatory Visit: Payer: Self-pay

## 2022-08-02 MED ORDER — CLONIDINE HCL 0.1 MG PO TABS: 0.1000 mg | ORAL_TABLET | Freq: Three times a day (TID) | ORAL | 3 refills | Status: DC

## 2022-09-06 ENCOUNTER — Other Ambulatory Visit: Payer: Self-pay | Admitting: Family Medicine

## 2022-09-06 NOTE — Telephone Encounter (Signed)
LAST APPOINTMENT DATE:  07/16/22  NEXT APPOINTMENT DATE: 10/15/22  MEDICATION: ALPRAZolam (XANAX) 0.5 MG tablet  lisinopril (ZESTRIL) 40 MG tablet   Is the patient out of medication? Yes  PHARMACY: [place], [place], [place] [address] Phone: [phone]  Fax: [phone]  Patient states: -She needs xanax again due to mother's recent passing

## 2022-09-07 MED ORDER — ALPRAZOLAM 0.5 MG PO TABS
ORAL_TABLET | ORAL | 0 refills | Status: DC
Start: 2022-09-07 — End: 2023-07-12

## 2022-09-23 ENCOUNTER — Other Ambulatory Visit: Payer: Self-pay | Admitting: Family Medicine

## 2022-09-26 ENCOUNTER — Telehealth: Payer: Self-pay | Admitting: Family Medicine

## 2022-09-26 ENCOUNTER — Other Ambulatory Visit: Payer: Self-pay

## 2022-09-26 MED ORDER — LISINOPRIL 40 MG PO TABS: 40.0000 mg | ORAL_TABLET | Freq: Every day | ORAL | 3 refills | Status: DC

## 2022-09-26 NOTE — Telephone Encounter (Signed)
   LAST APPOINTMENT DATE:  07/16/22  NEXT APPOINTMENT DATE: 10/15/22  MEDICATION: lisinopril (ZESTRIL) 40 MG tablet   Is the patient out of medication? Has been out for a week   PHARMACY: [place], [place], [place] [address] Phone: [phone]  Fax: [phone]    Patient states she would like to send this medication [facility] from now on since it takes too long to get it from [facility].

## 2022-09-26 NOTE — Telephone Encounter (Signed)
Rx sent 

## 2022-10-15 ENCOUNTER — Ambulatory Visit: Payer: Medicare Other | Admitting: Family Medicine

## 2022-10-24 ENCOUNTER — Encounter: Payer: Self-pay | Admitting: Family Medicine

## 2022-10-24 ENCOUNTER — Ambulatory Visit (INDEPENDENT_AMBULATORY_CARE_PROVIDER_SITE_OTHER): Payer: Medicare Other | Admitting: Family Medicine

## 2022-10-24 VITALS — BP 136/76 | HR 67 | Temp 97.9°F | Ht 67.0 in | Wt 186.6 lb

## 2022-10-24 DIAGNOSIS — M8580 Other specified disorders of bone density and structure, unspecified site: Secondary | ICD-10-CM

## 2022-10-24 DIAGNOSIS — G250 Essential tremor: Secondary | ICD-10-CM | POA: Diagnosis not present

## 2022-10-24 DIAGNOSIS — N1831 Chronic kidney disease, stage 3a: Secondary | ICD-10-CM

## 2022-10-24 DIAGNOSIS — F4381 Prolonged grief disorder: Secondary | ICD-10-CM

## 2022-10-24 DIAGNOSIS — F3341 Major depressive disorder, recurrent, in partial remission: Secondary | ICD-10-CM

## 2022-10-24 DIAGNOSIS — J410 Simple chronic bronchitis: Secondary | ICD-10-CM

## 2022-10-24 DIAGNOSIS — E1121 Type 2 diabetes mellitus with diabetic nephropathy: Secondary | ICD-10-CM

## 2022-10-24 DIAGNOSIS — Z780 Asymptomatic menopausal state: Secondary | ICD-10-CM

## 2022-10-24 DIAGNOSIS — Z23 Encounter for immunization: Secondary | ICD-10-CM | POA: Diagnosis not present

## 2022-10-24 DIAGNOSIS — N390 Urinary tract infection, site not specified: Secondary | ICD-10-CM | POA: Diagnosis not present

## 2022-10-24 DIAGNOSIS — E1159 Type 2 diabetes mellitus with other circulatory complications: Secondary | ICD-10-CM | POA: Diagnosis not present

## 2022-10-24 DIAGNOSIS — M170 Bilateral primary osteoarthritis of knee: Secondary | ICD-10-CM | POA: Diagnosis not present

## 2022-10-24 DIAGNOSIS — F4321 Adjustment disorder with depressed mood: Secondary | ICD-10-CM

## 2022-10-24 DIAGNOSIS — I152 Hypertension secondary to endocrine disorders: Secondary | ICD-10-CM

## 2022-10-24 LAB — POCT GLYCOSYLATED HEMOGLOBIN (HGB A1C): Hemoglobin A1C: 5.9 % — AB (ref 4.0–5.6)

## 2022-10-24 MED ORDER — SULFAMETHOXAZOLE-TRIMETHOPRIM 800-160 MG PO TABS: 1.0000 | ORAL_TABLET | Freq: Every day | ORAL | 3 refills | Status: DC

## 2022-10-24 MED ORDER — PROPRANOLOL HCL 40 MG PO TABS: 40.0000 mg | ORAL_TABLET | Freq: Two times a day (BID) | ORAL | 2 refills | Status: DC | PRN

## 2022-10-24 NOTE — Progress Notes (Signed)
Subjective  CC:  Chief Complaint  Patient presents with   Diabetes    HPI: Jessica Curry is a 71 y.o. female who presents to the office today for follow up of diabetes and problems listed above in the chief complaint.  Diabetes follow up: Her diabetic control is reported as Unchanged.  Now on Farxiga 10 mg daily for chronic proteinuria and chronic kidney disease.  No symptoms of hypoglycemia She denies exertional CP or SOB or symptomatic hypoglycemia. She denies foot sores or paresthesias.  Grief: Reports she is not doing well since the death of her mother.  Has not yet signed up grief counseling.  She is on medication for depression which she feels is helpful.  No severe depressive symptoms but becoming quite isolated and not very active. Recurrent UTIs on Septra prophylaxis and doing well Hypertension: Has been controlled on clonidine 0.1 3 times daily and lisinopril 40 daily.  Blood pressure is mildly elevated in the office today above goal.  No chest pain or shortness of breath. Complains of worsening hand tremor.  Has voice tremor.  Tremor is worse when she is anxious.  It is relieved by Xanax.  Is bilateral.  Is symmetric.  She has not taken medication for it but is open to trying now that it is interfering a bit when she writes and eats. Osteopenia due for DEXA.  Not exercising regularly at this time Osteoarthritis of knee limits exercise.  Chronic pain.  Has orthopedics.  End-stage, replacement is an option.  Wt Readings from Last 3 Encounters:  10/24/22 186 lb 9.6 oz (84.6 kg)  07/16/22 187 lb 9.6 oz (85.1 kg)  06/07/22 188 lb 12.8 oz (85.6 kg)    BP Readings from Last 3 Encounters:  10/24/22 136/76  07/16/22 118/62  06/07/22 130/82    Assessment  1. Type 2 diabetes mellitus with nephropathy ([place])   2. Hypertension associated with diabetes ([place])   3. Chronic kidney disease, stage 3a ([place])   4. Osteopenia, unspecified location   5. Asymptomatic menopausal state   6.  Recurrent UTI   7. Complicated grief   8. Primary osteoarthritis of both knees   9. Simple chronic bronchitis ([place])   10. Depression, major, recurrent, in partial remission ([place])   11. Essential tremor   12. Need for immunization against influenza      Plan  Diabetes is currently very well controlled.  Continue Farxiga 10 mg daily.  Monitor for improvement in proteinuria Hypertension: Fairly well controlled, the addition of propranolol to treat her essential tremor discussed, and this should help blood pressure control as well.  Continue lisinopril and clonidine Refill Bactrim for UTI prophylaxis grief counseling recommended.  Monitor depressive symptoms.  Continue antidepressants Essential tremor: New diagnosis.  Start beta-blocker as needed.  Monitor Patient to schedule bone density to follow-up on osteopenia.  Recommend exercise Monitoring kidney function.  No symptoms.  On Farxiga Flu shot today   I spent a total of 42 minutes for this patient encounter. Time spent included preparation, face-to-face counseling with the patient and coordination of care, review of chart and records, and documentation of the encounter.  Follow up: 3 months for recheck, CPE. Orders Placed This Encounter  Procedures   DG Bone Density   Flu Vaccine QUAD High Dose(Fluad)   POCT HgB A1C   Meds ordered this encounter  Medications   sulfamethoxazole-trimethoprim (BACTRIM DS) 800-160 MG tablet    Sig: Take 1 tablet by mouth daily.  Dispense:  90 tablet    Refill:  3   propranolol (INDERAL) 40 MG tablet    Sig: Take 1 tablet (40 mg total) by mouth 2 (two) times daily as needed (tremor).    Dispense:  60 tablet    Refill:  2      Immunization History  Administered Date(s) Administered   Fluad Quad(high Dose 65+) 08/05/2019, 08/18/2020, 08/09/2021, 10/24/2022   Influenza, High Dose Seasonal PF 08/20/2017, 08/06/2018   Influenza-Unspecified 09/24/2016   PFIZER Comirnaty(Gray Top)Covid-19  Tri-Sucrose Vaccine 04/10/2021   PFIZER(Purple Top)SARS-COV-2 Vaccination 12/15/2019, 01/05/2020   Pfizer Covid-19 Vaccine Bivalent Booster 68yr & up 09/04/2021   Pneumococcal Conjugate-13 09/10/2016   Pneumococcal Polysaccharide-23 12/02/2017   Tdap 08/20/2018, 12/05/2018   Zoster Recombinat (Shingrix) 06/05/2017, 09/18/2017    Diabetes Related Lab Review: Lab Results  Component Value Date   HGBA1C 5.9 (A) 10/24/2022   HGBA1C 5.7 (A) 07/16/2022   HGBA1C 5.7 (A) 03/29/2022    Lab Results  Component Value Date   MICROALBUR 141.4 (H) 03/29/2022   Lab Results  Component Value Date   CREATININE 1.57 (H) 07/26/2022   BUN 25 (H) 07/26/2022   NA 132 (L) 07/26/2022   K 5.0 07/26/2022   CL 101 07/26/2022   CO2 23 07/26/2022   Lab Results  Component Value Date   CHOL 180 12/29/2021   CHOL 158 12/26/2020   CHOL 144 09/15/2020   Lab Results  Component Value Date   HDL 47.60 12/29/2021   HDL 43.10 12/26/2020   HDL 39 (L) 09/15/2020   Lab Results  Component Value Date   LDLCALC 78 09/15/2020   Lab Results  Component Value Date   TRIG 299.0 (H) 12/29/2021   TRIG 226.0 (H) 12/26/2020   TRIG 174 (H) 09/15/2020   Lab Results  Component Value Date   CHOLHDL 4 12/29/2021   CHOLHDL 4 12/26/2020   CHOLHDL 3.7 09/15/2020   Lab Results  Component Value Date   LDLDIRECT 94.0 12/29/2021   LDLDIRECT 96.0 12/26/2020   LDLDIRECT 96.0 09/17/2019   The 10-year ASCVD risk score (Arnett DK, et al., 2019) is: 41%   Values used to calculate the score:     Age: [age]years     Sex: Female     Is Non-Hispanic African American: No     Diabetic: Yes     Tobacco smoker: Yes     Systolic Blood Pressure: 1169mmHg     Is BP treated: Yes     HDL Cholesterol: 47.6 mg/dL     Total Cholesterol: 180 mg/dL I have reviewed the PMH, Fam and Soc history. Patient Active Problem List   Diagnosis Date Noted   Chronic kidney disease, stage 3a ([place] 02/07/2021    Priority: High    Renal  ultrasound: mild increased echogenecity c/w medicorenal disease. Renal cysts w/ benign appearing f/u MRI Started farxiga 06/2022    History of heart artery stent 02/10/2020    Priority: High   Simple chronic bronchitis ([facility] 02/11/2018    Priority: High    SPIRO NML 01/2018     Hyperlipidemia associated with type 2 diabetes mellitus ([facility] 06/04/2017    Priority: High    Atorvastatin '20mg'$  MWF    Type 2 diabetes mellitus with nephropathy ([facility] 09/10/2016    Priority: High   Hypertension associated with diabetes ([facility] 09/10/2016    Priority: High   Nicotine dependence, cigarettes, w unsp disorders 09/10/2016    Priority: High    lung cancer screening-  3/4 PPD- since age 51 prior 2 PPD. 1 PPD. Cut down since heart attack.     Coronary artery disease of native artery of native heart with stable angina pectoris [facility])     Priority: High    [place].  Statin.  Dr. Marlou Porch now- previouslyNo cardiology since 2012 due to insurance issues.  Dr. Marijo File stent with [facility] in [place]. Cobalt chromium stent 06/05/2011- toPCI/bare metal to mid circumflex. Has not seen him since.     Depression, major, recurrent, in partial remission ([place])     Priority: High    reasonable control on venlafaxine '75mg'$  TID. xanax #14 lasts about 2 years for anxiety portion.     Primary osteoarthritis of both knees 10/24/2022    Priority: Medium    Cholelithiasis 10/04/2021    Priority: Medium     RUQ ultrasound: biliary sludge and stones    Claudication ([place]) 08/28/2021    Priority: Medium    History of sepsis 05/11/2021    Priority: Medium     2022; unclear source    Chronic hyponatremia 02/07/2021    Priority: Medium    Recurrent UTI 02/06/2021    Priority: Medium     Septra prophylaxis started march 2023    Bilateral renal cysts 02/06/2021    Priority: Medium     Renal ultrasound 4/20200 bilateral cysts; rec repeat in 6 months to recheck.  F/u MRI 01/2022: benign exophytic renal cyst. 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    Serrated polyp of colon 08/12/2018    Priority: Medium     Sessile serrated polyp 2019    Osteopenia 06/10/2017    Priority: Medium     Dexa: 07/2020 stable osteopenia, 06/10/17: osteopenia     Encounter for screening for lung cancer 02/10/2020    Priority: Low    Annual by chest CT; h/o benign appearing nodules.     Mild intermittent asthma 08/06/2019    Priority: Low   Essential tremor 10/24/2022    Social History: Patient  reports that she has been smoking cigarettes. She has a 24.00 pack-year smoking history. She has never used smokeless tobacco. She reports current alcohol use of about 4.0 standard drinks of alcohol per week. She reports that she does not use drugs.  Review of Systems: Ophthalmic: negative for eye pain, loss of vision or double vision Cardiovascular: negative for chest pain Respiratory: negative for SOB or persistent cough Gastrointestinal: negative for abdominal pain Genitourinary: negative for dysuria or gross hematuria MSK: negative for foot lesions Neurologic: negative for weakness or gait disturbance  Objective  Vitals: BP 136/76   Pulse 67   Temp 97.9 F (36.6 C)   Ht '5\' 7"'$  (1.702 m)   Wt 186 lb 9.6 oz (84.6 kg)   LMP  (LMP Unknown)   SpO2 99%   BMI 29.23 kg/m  General: well appearing, no acute distress  Psych:  Alert and oriented, flat mood and affect, fair insight HEENT:  Normocephalic, atraumatic, moist mucous membranes, supple neck  Cardiovascular:  Nl S1 and S2, RRR without murmur, gallop or rub. no edema Respiratory:  Good breath sounds bilaterally, CTAB with normal effort, no rales Gastrointestinal: normal BS, soft, nontender Skin:  Warm, no  rashes Neurologic:   Mental status is normal. normal gait, bilateral tremor present     Diabetic education: ongoing education regarding chronic disease management for diabetes was given today. We continue to reinforce the ABC's of diabetic management: A1c (<7 or 8 dependent upon patient), tight blood pressure control, and cholesterol management with goal LDL < 100 minimally. We discuss diet strategies, exercise recommendations, medication options and possible side effects. At each visit, we review recommended immunizations and preventive care recommendations for diabetics and stress that good diabetic control can prevent other problems. See below for this patient's data.   Commons side effects, risks, benefits, and alternatives for medications and treatment plan prescribed today were discussed, and the patient expressed understanding of the given instructions. Patient is instructed to call or message via MyChart if he/she has any questions or concerns regarding our treatment plan. No barriers to understanding were identified. We discussed Red Flag symptoms and signs in detail. Patient expressed understanding regarding what to do in case of urgent or emergency type symptoms.  Medication list was reconciled, printed and provided to the patient in AVS. Patient instructions and summary information was reviewed with the patient as documented in the AVS. This note was prepared with assistance of Dragon voice recognition software. Occasional wrong-word or sound-a-like substitutions may have occurred due to the inherent limitations of voice recognition software

## 2022-10-24 NOTE — Patient Instructions (Addendum)
Please return in 3 months for your annual complete physical; please come fasting.   Consider grief counseling through hospice.   Use the new medication, propranolol as needed to see if it calms your tremor. You may take it up to twice daily if needed.  It should lower your blood pressure a little as well; let me know if you feel lightheaded or if your blood pressure goes too low.   If you have any questions or concerns, please don't hesitate to send me a message via MyChart or call the office at [phone]. Thank you for visiting with Jessica Curry today! It's our pleasure caring for you.   Managing Loss, Adult People experience loss in many different ways throughout their lives. Events such as moving, changing jobs, and losing friends can create a sense of loss. The loss may be as serious as a major health change, divorce, death of a pet, or death of a loved one. All of these types of loss are likely to create a physical and emotional reaction known as grief. Grief is the result of a major change or an absence of something or someone that you count on. Grief is a normal reaction to loss. A variety of factors can affect your grieving experience, including: The nature of your loss. Your relationship to what or whom you lost. Your understanding of grief and how to manage it. Your support system. Be aware that when grief becomes extreme, it can lead to more severe issues like isolation, depression, anxiety, or suicidal thoughts. Talk with your health care provider if you have any of these issues. How to manage lifestyle changes Keep to your normal routine as much as possible. If you have trouble focusing or doing normal activities, it is acceptable to take some time away from your normal routine. Spend time with friends and loved ones. Eat a healthy diet, get plenty of sleep, and rest when you feel tired. How to recognize changes  The way that you deal with your grief will affect your ability to function  as you normally do. When grieving, you may experience these changes: Numbness, shock, sadness, anxiety, anger, denial, and guilt. Thoughts about death. Unexpected crying. A physical sensation of emptiness in your stomach. Problems sleeping and eating. Tiredness (fatigue). Loss of interest in normal activities. Dreaming about or imagining seeing the person who died. A need to remember what or whom you lost. Difficulty thinking about anything other than your loss for a period of time. Relief. If you have been expecting the loss for a while, you may feel a sense of relief when it happens. Follow these instructions at home: Activity Express your feelings in healthy ways, such as: Talking with others about your loss. It may be helpful to find others who have had a similar loss, such as a support group. Writing down your feelings in a journal. Doing physical activities to release stress and emotional energy. Doing creative activities like painting, sculpting, or playing or listening to music. Practicing resilience. This is the ability to recover and adjust after facing challenges. Reading some resources that encourage resilience may help you to learn ways to practice those behaviors.  General instructions Be patient with yourself and others. Allow the grieving process to happen, and remember that grieving takes time. It is likely that you may never feel completely done with some grief. You may find a way to move on while still cherishing memories and feelings about your loss. Accepting your loss is a process. It  can take months or longer to adjust. Keep all follow-up visits. This is important. Where to find support To get support for managing loss: Ask your health care provider for help and recommendations, such as grief counseling or therapy. Think about joining a support group for people who are managing a loss. Where to find more information You can find more information about managing  loss from: American Society of Clinical Oncology: www.cancer.net American Psychological Association: TVStereos.ch Contact a health care provider if: Your grief is extreme and keeps getting worse. You have ongoing grief that does not improve. Your body shows symptoms of grief, such as illness. You feel depressed, anxious, or hopeless. Get help right away if: You have thoughts about hurting yourself or others. Get help right away if you feel like you may hurt yourself or others, or have thoughts about taking your own life. Go to your nearest emergency room or: Call 911. Call the [facility] at [phone]) [phone] or 988. This is open 24 hours a day. Text the Crisis Text Line at 204-681-9751. Summary Grief is the result of a major change or an absence of someone or something that you count on. Grief is a normal reaction to loss. The depth of grief and the period of recovery depend on the type of loss and your ability to adjust to the change and process your feelings. Processing grief requires patience and a willingness to accept your feelings and talk about your loss with people who are supportive. It is important to find resources that work for you and to realize that people experience grief differently. There is not one grieving process that works for everyone in the same way. Be aware that when grief becomes extreme, it can lead to more severe issues like isolation, depression, anxiety, or suicidal thoughts. Talk with your health care provider if you have any of these issues. This information is not intended to replace advice given to you by your health care provider. Make sure you discuss any questions you have with your health care provider. Document Revised: 07/03/2021 Document Reviewed: 07/03/2021 Elsevier Patient Education  [place].

## 2022-12-20 ENCOUNTER — Encounter: Payer: Self-pay | Admitting: Family Medicine

## 2022-12-20 ENCOUNTER — Other Ambulatory Visit: Payer: Self-pay | Admitting: Family Medicine

## 2022-12-20 ENCOUNTER — Ambulatory Visit (INDEPENDENT_AMBULATORY_CARE_PROVIDER_SITE_OTHER): Payer: Medicare Other | Admitting: Family Medicine

## 2022-12-20 VITALS — BP 108/68 | HR 57 | Temp 98.0°F | Ht 67.0 in | Wt 187.2 lb

## 2022-12-20 DIAGNOSIS — J410 Simple chronic bronchitis: Secondary | ICD-10-CM

## 2022-12-20 DIAGNOSIS — N390 Urinary tract infection, site not specified: Secondary | ICD-10-CM

## 2022-12-20 DIAGNOSIS — R509 Fever, unspecified: Secondary | ICD-10-CM | POA: Diagnosis not present

## 2022-12-20 DIAGNOSIS — F3341 Major depressive disorder, recurrent, in partial remission: Secondary | ICD-10-CM | POA: Diagnosis not present

## 2022-12-20 DIAGNOSIS — F17219 Nicotine dependence, cigarettes, with unspecified nicotine-induced disorders: Secondary | ICD-10-CM | POA: Diagnosis not present

## 2022-12-20 LAB — POCT URINALYSIS DIPSTICK
Bilirubin, UA: NEGATIVE
Blood, UA: NEGATIVE
Glucose, UA: POSITIVE — AB
Ketones, UA: NEGATIVE
Leukocytes, UA: NEGATIVE
Nitrite, UA: NEGATIVE
Protein, UA: POSITIVE — AB
Spec Grav, UA: 1.015 (ref 1.010–1.025)
Urobilinogen, UA: 0.2 E.U./dL
pH, UA: 6 (ref 5.0–8.0)

## 2022-12-20 MED ORDER — AMLODIPINE BESYLATE 5 MG PO TABS: 5.0000 mg | ORAL_TABLET | Freq: Every day | ORAL | 3 refills | Status: DC

## 2022-12-20 MED ORDER — SULFAMETHOXAZOLE-TRIMETHOPRIM 800-160 MG PO TABS: 1.0000 | ORAL_TABLET | Freq: Every day | ORAL | 3 refills | Status: DC

## 2022-12-20 NOTE — Patient Instructions (Signed)
Please return in 3 months for your annual complete physical and diabetes recheck; please come fasting.   I will release your lab results to you on your MyChart account with further instructions. You may see the results before I do, but when I review them I will send you a message with my report or have my assistant call you if things need to be discussed. Please reply to my message with any questions. Thank you!   If you have any questions or concerns, please don't hesitate to send me a message via MyChart or call the office at [phone]. Thank you for visiting with [place] today! It's our pleasure caring for you.

## 2022-12-20 NOTE — Progress Notes (Signed)
Subjective   CC:  Chief Complaint  Patient presents with   Urinary Tract Infection    Pt states UTI sx has been ongoing for a couple of weeks, this past weekend the urgency and pull started, urine cloudy for a couple of weeks. Pt states cough has gotten worse for the last couple of months    Cough   speratic fever    HPI: Jessica Curry is a 72 y.o. female who presents to the office today to address the problems listed above in the chief complaint. Patient reports 2 weeks of urinary sxs as noted above. Has h/o recurrent uti and urosepsis on septra prophylaxis, however she ran out of abx about 2 months ago and did not get it refilled. No f/c/s or flank pain.  Cough/copd and still smoking. Denies sob Depression exacerbated by grief; not coping well with loss of mother and move to her own home. Stays home a lot. Low motivation. On long term high dose venlafaxine.  Reports intermittent and recurrent low grade fevers with associated chills. Can happen several times per week. On going for > 6 months. No associated cough change, pain, sob, gi sxs, abdominal pain or rash. Will spontaneously resolve after an hour or two.   Assessment  1. Recurrent UTI   2. Simple chronic bronchitis ([place])   3. Nicotine dependence, cigarettes, w unsp disorders   4. Depression, major, recurrent, in partial remission ([place])   5. Low grade fever   6. Fever, unknown origin      Plan  Recurrent uti: check urine culture. Restart septra prophylaxis. No sign of pyelo or sepsis COPD and smoker. Continue inhalers. No sign of respiratory infection today Depression and grief. Counseling done FUO: no murmur. Check labs. Unclear.   Follow up: 3 mo for cpe  Orders Placed This Encounter  Procedures   Urine Culture   Urine Microscopic Only   Lactic acid, plasma   Sedimentation rate   C-reactive protein   CBC with Differential/Platelet   TSH   ANA   QuantiFERON-TB Gold Plus   POCT Urinalysis Dipstick   Meds  ordered this encounter  Medications   sulfamethoxazole-trimethoprim (BACTRIM DS) 800-160 MG tablet    Sig: Take 1 tablet by mouth daily.    Dispense:  90 tablet    Refill:  3   amLODipine (NORVASC) 5 MG tablet    Sig: Take 1 tablet (5 mg total) by mouth daily.    Dispense:  90 tablet    Refill:  3      I reviewed the patients updated PMH, FH, and SocHx.    Patient Active Problem List   Diagnosis Date Noted   Chronic kidney disease, stage 3a ([place]) 02/07/2021    Priority: High   History of heart artery stent 02/10/2020    Priority: High   Simple chronic bronchitis ([place]) 02/11/2018    Priority: High   Hyperlipidemia associated with type 2 diabetes mellitus ([place]) 06/04/2017    Priority: High   Type 2 diabetes mellitus with nephropathy ([place]) 09/10/2016    Priority: High   Hypertension associated with diabetes ([place]) 09/10/2016    Priority: High   Nicotine dependence, cigarettes, w unsp disorders 09/10/2016    Priority: High   Coronary artery disease of native artery of native heart with stable angina pectoris ([place])     Priority: High   Depression, major, recurrent, in partial remission ([place])     Priority: High   Primary osteoarthritis of both  knees 10/24/2022    Priority: Medium    Cholelithiasis 10/04/2021    Priority: Medium    Claudication ([place]) 08/28/2021    Priority: Medium    History of sepsis 05/11/2021    Priority: Medium    Chronic hyponatremia 02/07/2021    Priority: Medium    Recurrent UTI 02/06/2021    Priority: Medium    Bilateral renal cysts 02/06/2021    Priority: Medium    Hyponatremia 01/02/2021    Priority: Medium    Chronic midline low back pain without sciatica 12/26/2020    Priority: Medium    Serrated polyp of colon 08/12/2018    Priority: Medium    Osteopenia 06/10/2017    Priority: Medium    Encounter for screening for lung cancer 02/10/2020    Priority: Low   Mild intermittent asthma 08/06/2019    Priority: Low   Essential tremor  10/24/2022   Current Meds  Medication Sig   albuterol (VENTOLIN HFA) 108 (90 Base) MCG/ACT inhaler USE 2 INHALATIONS BY MOUTH EVERY 6 HOURS AS NEEDED FOR WHEEZING  OR SHORTNESS OF BREATH   ALPRAZolam (XANAX) 0.5 MG tablet TAKE ONE TABLET BY MOUTH DAILY AS NEEDED FOR ANXIETY   aspirin 81 MG tablet Take 81 mg by mouth at bedtime.   atorvastatin (LIPITOR) 40 MG tablet Take 1 tablet (40 mg total) by mouth at bedtime.   cloNIDine (CATAPRES) 0.1 MG tablet Take 1 tablet (0.1 mg total) by mouth 3 (three) times daily.   cyclobenzaprine (FLEXERIL) 10 MG tablet TAKE ONE TABLET BY MOUTH AT BEDTIME AS NEEDED FOR MUSCLE SPASMS   dapagliflozin propanediol (FARXIGA) 10 MG TABS tablet Take 1 tablet (10 mg total) by mouth daily before breakfast.   diphenhydrAMINE (BENADRYL) 25 MG tablet Take 25 mg by mouth every 6 (six) hours as needed for allergies or sleep.   lisinopril (ZESTRIL) 40 MG tablet Take 1 tablet (40 mg total) by mouth daily.   meloxicam (MOBIC) 15 MG tablet Take 1 tablet (15 mg total) by mouth daily.   Multiple Vitamins-Minerals (MULTIVITAMIN WITH MINERALS) tablet Take 1 tablet by mouth daily. Centrum silver for women   promethazine (PHENERGAN) 25 MG tablet Take 1 tablet (25 mg total) by mouth every 6 (six) hours as needed for nausea or vomiting.   venlafaxine XR (EFFEXOR-XR) 75 MG 24 hr capsule TAKE 3 CAPSULES BY MOUTH  DAILY WITH BREAKFAST   [DISCONTINUED] propranolol (INDERAL) 40 MG tablet Take 1 tablet (40 mg total) by mouth 2 (two) times daily as needed (tremor).   [DISCONTINUED] sulfamethoxazole-trimethoprim (BACTRIM DS) 800-160 MG tablet Take 1 tablet by mouth daily.    Review of Systems: Cardiovascular: negative for chest pain Respiratory: negative for SOB or persistent cough Gastrointestinal: negative for abdominal pain Constitutional: Negative for fever malaise or anorexia  Objective  Vitals: BP 108/68 (BP Location: Right Arm, Patient Position: Sitting)   Pulse (!) 57   Temp 98 F  (36.7 C) (Temporal)   Ht '5\' 7"'$  (1.702 m)   Wt 187 lb 3.2 oz (84.9 kg)   LMP  (LMP Unknown)   SpO2 99%   BMI 29.32 kg/m  General: no acute distress  Psych:  Alert and oriented, normal mood and affect Cardiovascular:  RRR without murmur or gallop. no peripheral edema Respiratory:  Good breath sounds bilaterally, CTAB with some exp wheeze Gastrointestinal: soft, flat abdomen, normal active bowel sounds, no palpable masses, no hepatosplenomegaly, no appreciated hernias, NO CVAT, no suprapubic ttp w/o rebound or guarding Skin:  Warm,  no rashes Neurologic:   Mental status is normal. normal gait Office Visit on 12/20/2022  Component Date Value Ref Range Status   Color, UA 12/20/2022 yellow   Final   Clarity, UA 12/20/2022 cloudy   Final   Glucose, UA 12/20/2022 Positive (A)  Negative Final   Bilirubin, UA 12/20/2022 neg   Final   Ketones, UA 12/20/2022 neg   Final   Spec Grav, UA 12/20/2022 1.015  1.010 - 1.025 Final   Blood, UA 12/20/2022 neg   Final   pH, UA 12/20/2022 6.0  5.0 - 8.0 Final   Protein, UA 12/20/2022 Positive (A)  Negative Final   Urobilinogen, UA 12/20/2022 0.2  0.2 or 1.0 E.U./dL Final   Nitrite, UA 12/20/2022 neg   Final   Leukocytes, UA 12/20/2022 Negative  Negative Final   Odor 12/20/2022 yes   Final  On [id number]  Commons side effects, risks, benefits, and alternatives for medications and treatment plan prescribed today were discussed, and the patient expressed understanding of the given instructions. Patient is instructed to call or message via MyChart if he/she has any questions or concerns regarding our treatment plan. No barriers to understanding were identified. We discussed Red Flag symptoms and signs in detail. Patient expressed understanding regarding what to do in case of urgent or emergency type symptoms.  Medication list was reconciled, printed and provided to the patient in AVS. Patient instructions and summary information was reviewed with the patient as  documented in the AVS. This note was prepared with assistance of Dragon voice recognition software. Occasional wrong-word or sound-a-like substitutions may have occurred due to the inherent limitations of voice recognition software

## 2022-12-21 DIAGNOSIS — N390 Urinary tract infection, site not specified: Secondary | ICD-10-CM | POA: Diagnosis not present

## 2022-12-21 LAB — CBC WITH DIFFERENTIAL/PLATELET
Basophils Absolute: 0 10*3/uL (ref 0.0–0.1)
Basophils Relative: 0.5 % (ref 0.0–3.0)
Eosinophils Absolute: 0.2 10*3/uL (ref 0.0–0.7)
Eosinophils Relative: 3 % (ref 0.0–5.0)
HCT: 38.9 % (ref 36.0–46.0)
Hemoglobin: 13.1 g/dL (ref 12.0–15.0)
Lymphocytes Relative: 26.8 % (ref 12.0–46.0)
Lymphs Abs: 1.3 10*3/uL (ref 0.7–4.0)
MCHC: 33.8 g/dL (ref 30.0–36.0)
MCV: 93.8 fl (ref 78.0–100.0)
Monocytes Absolute: 0.4 10*3/uL (ref 0.1–1.0)
Monocytes Relative: 8.6 % (ref 3.0–12.0)
Neutro Abs: 3.1 10*3/uL (ref 1.4–7.7)
Neutrophils Relative %: 61.1 % (ref 43.0–77.0)
Platelets: 319 10*3/uL (ref 150.0–400.0)
RBC: 4.15 Mil/uL (ref 3.87–5.11)
RDW: 13.2 % (ref 11.5–15.5)
WBC: 5 10*3/uL (ref 4.0–10.5)

## 2022-12-21 LAB — TSH: TSH: 1.96 u[IU]/mL (ref 0.35–5.50)

## 2022-12-21 LAB — SEDIMENTATION RATE: Sed Rate: 45 mm/hr — ABNORMAL HIGH (ref 0–30)

## 2022-12-21 LAB — C-REACTIVE PROTEIN: CRP: 4.6 mg/dL (ref 0.5–20.0)

## 2022-12-21 NOTE — Addendum Note (Signed)
Addended by: Loura Back on: 12/21/2022 05:32 PM   Modules accepted: Orders

## 2022-12-22 LAB — URINALYSIS, MICROSCOPIC ONLY
Hyaline Cast: NONE SEEN /LPF
RBC / HPF: NONE SEEN /HPF (ref 0–2)

## 2022-12-23 LAB — LACTIC ACID, PLASMA: LACTIC ACID: 2.1 mmol/L — ABNORMAL HIGH (ref 0.4–1.8)

## 2022-12-23 LAB — URINE CULTURE
MICRO NUMBER:: 14473205
SPECIMEN QUALITY:: ADEQUATE

## 2022-12-23 LAB — QUANTIFERON-TB GOLD PLUS
Mitogen-NIL: >10 IU/mL
NIL: 0.08 IU/mL
QuantiFERON-TB Gold Plus: NEGATIVE
TB1-NIL: 0.02 IU/mL
TB2-NIL: 0.01 IU/mL

## 2022-12-23 LAB — ANA: Anti Nuclear Antibody (ANA): NEGATIVE

## 2022-12-24 NOTE — Progress Notes (Signed)
Please call patient: I have reviewed his/her lab results. Urine is infected. Please take the septra DS 1 tab twice a day for a week, then return to once daily.

## 2023-03-03 ENCOUNTER — Other Ambulatory Visit: Payer: Self-pay | Admitting: Acute Care

## 2023-03-03 DIAGNOSIS — Z122 Encounter for screening for malignant neoplasm of respiratory organs: Secondary | ICD-10-CM

## 2023-03-03 DIAGNOSIS — F17210 Nicotine dependence, cigarettes, uncomplicated: Secondary | ICD-10-CM

## 2023-03-03 DIAGNOSIS — Z87891 Personal history of nicotine dependence: Secondary | ICD-10-CM

## 2023-03-13 ENCOUNTER — Telehealth: Payer: Self-pay | Admitting: Pharmacist

## 2023-03-13 NOTE — Progress Notes (Unsigned)
Care Management & Coordination Services Pharmacy Team  Reason for Encounter: General adherence update   Contacted patient for general health update and medication adherence call.  {US HC Outreach:28874}   What concerns do you have about your medications?  The patient {denies/reports:25180} side effects with their medications.   How often do you forget or accidentally miss a dose? {missed doses:25554}  Do you use a pillbox? {yes/no:20286}  Are you having any problems getting your medications from your pharmacy? {yes/no:20286}  Has the cost of your medications been a concern? {yes/no:20286} If yes, what medication and is patient assistance available or has it been applied for?  Since last visit with PharmD, {no/thefollowing:25210} interventions have been made.   The patient {has/has not:25209} had an ED visit since last contact.   The patient {denies/reports:25180} problems with their health.   Patient {denies/reports:25180} concerns or questions for ***, PharmD at this time.   Counseled patient on: {GENERALCOUNSELING:28686}   Chart Updates:  Recent office visits:  12/20/2022 [facility] (PCP) Willow Ora, MD; Restart septra prophylaxis.  10/24/2022 [facility] (PCP) Willow Ora, MD;  no medication changes.  Recent consult visits:  None  Hospital visits:  None in previous 6 months  Medications: Outpatient Encounter Medications as of 03/13/2023  Medication Sig Note   albuterol (VENTOLIN HFA) 108 (90 Base) MCG/ACT inhaler USE 2 INHALATIONS BY MOUTH EVERY 6 HOURS AS NEEDED FOR WHEEZING  OR SHORTNESS OF BREATH    ALPRAZolam (XANAX) 0.5 MG tablet TAKE ONE TABLET BY MOUTH DAILY AS NEEDED FOR ANXIETY    amLODipine (NORVASC) 5 MG tablet Take 1 tablet (5 mg total) by mouth daily.    aspirin 81 MG tablet Take 81 mg by mouth at bedtime.    atorvastatin (LIPITOR) 40 MG tablet Take 1 tablet (40 mg total) by mouth at bedtime.    cloNIDine (CATAPRES) 0.1 MG tablet Take 1 tablet (0.1 mg  total) by mouth 3 (three) times daily.    cyclobenzaprine (FLEXERIL) 10 MG tablet TAKE ONE TABLET BY MOUTH AT BEDTIME AS NEEDED FOR MUSCLE SPASMS    dapagliflozin propanediol (FARXIGA) 10 MG TABS tablet Take 1 tablet (10 mg total) by mouth daily before breakfast.    diphenhydrAMINE (BENADRYL) 25 MG tablet Take 25 mg by mouth every 6 (six) hours as needed for allergies or sleep.    lisinopril (ZESTRIL) 40 MG tablet Take 1 tablet (40 mg total) by mouth daily.    meloxicam (MOBIC) 15 MG tablet Take 1 tablet (15 mg total) by mouth daily.    Multiple Vitamins-Minerals (MULTIVITAMIN WITH MINERALS) tablet Take 1 tablet by mouth daily. Centrum silver for women    nitroGLYCERIN (NITROSTAT) 0.4 MG SL tablet Place 1 tablet (0.4 mg total) under the tongue every 5 (five) minutes as needed for chest pain. 05/10/2021: Needs refill   promethazine (PHENERGAN) 25 MG tablet Take 1 tablet (25 mg total) by mouth every 6 (six) hours as needed for nausea or vomiting.    sulfamethoxazole-trimethoprim (BACTRIM DS) 800-160 MG tablet Take 1 tablet by mouth daily.    venlafaxine XR (EFFEXOR-XR) 75 MG 24 hr capsule TAKE 3 CAPSULES BY MOUTH  DAILY WITH BREAKFAST    No facility-administered encounter medications on file as of 03/13/2023.    Recent vitals BP Readings from Last 3 Encounters:  12/20/22 108/68  10/24/22 136/76  07/16/22 118/62   Pulse Readings from Last 3 Encounters:  12/20/22 (!) 57  10/24/22 67  07/16/22 (!) 57   Wt Readings from Last 3 Encounters:  12/20/22 187 lb 3.2 oz (84.9 kg)  10/24/22 186 lb 9.6 oz (84.6 kg)  07/16/22 187 lb 9.6 oz (85.1 kg)   BMI Readings from Last 3 Encounters:  12/20/22 29.32 kg/m  10/24/22 29.23 kg/m  07/16/22 29.38 kg/m    Recent lab results    Component Value Date/Time   NA 132 (L) 07/26/2022 0958   K 5.0 07/26/2022 0958   CL 101 07/26/2022 0958   CO2 23 07/26/2022 0958   GLUCOSE 108 (H) 07/26/2022 0958   BUN 25 (H) 07/26/2022 0958   CREATININE 1.57 (H)  07/26/2022 0958   CREATININE 1.15 (H) 02/01/2021 1535   CALCIUM 9.8 07/26/2022 0958    Lab Results  Component Value Date   CREATININE 1.57 (H) 07/26/2022   GFR 33.09 (L) 07/26/2022   GFRNONAA 39 (L) 02/25/2022   GFRAA 56 (L) 02/01/2021   Lab Results  Component Value Date/Time   HGBA1C 5.9 (A) 10/24/2022 03:48 PM   HGBA1C 5.7 (A) 07/16/2022 09:56 AM   HGBA1C 6.2 12/29/2021 09:25 AM   HGBA1C 6.6 (H) 05/10/2021 06:49 PM   MICROALBUR 141.4 (H) 03/29/2022 11:21 AM    Lab Results  Component Value Date   CHOL 180 12/29/2021   HDL 47.60 12/29/2021   LDLCALC 78 09/15/2020   LDLDIRECT 94.0 12/29/2021   TRIG 299.0 (H) 12/29/2021   CHOLHDL 4 12/29/2021    Care Gaps: Annual wellness visit in last year? Yes  If Diabetic: Last eye exam / retinopathy screening: 06/18/2022 Last diabetic foot exam: 12/29/2021 Last UACR: 03/29/2022  Star Rating Drugs:  Atorvastatin 20 mg last filled 01/11/2023 90 DS Farxiga 10 mg last filled 03/08/2023 30 DS Lisinopril 40 mg last filled 01/03/2023 100 DS   Future Appointments  Date Time Provider Department Center  04/01/2023 11:40 AM [facility] 1 [facility]  06/17/2023 11:45 AM [facility] ANNUAL WELLNESS VISIT 1 [facility] P[facility]   April D Calhoun, [facility] Clinical Pharmacist Assistant [phone]

## 2023-04-01 ENCOUNTER — Ambulatory Visit
Admission: RE | Admit: 2023-04-01 | Discharge: 2023-04-01 | Disposition: A | Discharge status: Home / Self Care | Payer: Medicare Other | Source: Ambulatory Visit | Attending: Family Medicine | Admitting: Family Medicine

## 2023-04-01 DIAGNOSIS — Z87891 Personal history of nicotine dependence: Secondary | ICD-10-CM

## 2023-04-01 DIAGNOSIS — Z122 Encounter for screening for malignant neoplasm of respiratory organs: Secondary | ICD-10-CM | POA: Diagnosis not present

## 2023-04-01 DIAGNOSIS — J439 Emphysema, unspecified: Secondary | ICD-10-CM | POA: Diagnosis not present

## 2023-04-01 DIAGNOSIS — I700 Atherosclerosis of aorta: Secondary | ICD-10-CM | POA: Diagnosis not present

## 2023-04-01 DIAGNOSIS — F17200 Nicotine dependence, unspecified, uncomplicated: Secondary | ICD-10-CM | POA: Diagnosis not present

## 2023-04-01 DIAGNOSIS — F17210 Nicotine dependence, cigarettes, uncomplicated: Secondary | ICD-10-CM

## 2023-04-05 ENCOUNTER — Telehealth: Payer: Self-pay | Admitting: *Deleted

## 2023-04-05 DIAGNOSIS — Z122 Encounter for screening for malignant neoplasm of respiratory organs: Secondary | ICD-10-CM

## 2023-04-05 DIAGNOSIS — Z87891 Personal history of nicotine dependence: Secondary | ICD-10-CM

## 2023-04-05 DIAGNOSIS — F17210 Nicotine dependence, cigarettes, uncomplicated: Secondary | ICD-10-CM

## 2023-04-05 NOTE — Telephone Encounter (Signed)
Spoke with pt regarding lung screening CT results and advised that there were small nodules seen that will be monitored at her yearly scan. I asked pt if she had a recent fall with a hit to her chest area. Pt did report a fall 3-4 months ago with a hit to her mid chest area. I advised pt to follow up with her PCP if she is still having issues with this fall. PT verbalized understanding. CT results sent to PCP. Order placed for 12 month follow up lung screening CT.

## 2023-04-09 NOTE — Telephone Encounter (Signed)
Patient reported a traumatic fall to the chest area about 3 to 4 months ago.  This was reported to me by the pulmonary nurse.  Told to follow-up with me as needed.

## 2023-04-23 ENCOUNTER — Other Ambulatory Visit: Payer: Self-pay | Admitting: Family Medicine

## 2023-04-23 DIAGNOSIS — E1169 Type 2 diabetes mellitus with other specified complication: Secondary | ICD-10-CM

## 2023-04-29 ENCOUNTER — Encounter: Payer: Medicare Other | Admitting: Pharmacist

## 2023-04-29 NOTE — Progress Notes (Unsigned)
Care Management & Coordination Services Pharmacy Note  04/29/2023 Name:  Jessica Curry MRN:  [id number] DOB:  12-Mar-1951  Summary: ***  Recommendations/Changes made from today's visit: ***  Follow up plan: ***   Subjective: Jessica Curry is an 72 y.o. year old female who is a primary patient of Willow Ora, MD.  The care coordination team was consulted for assistance with disease management and care coordination needs.    Engaged with patient by telephone for follow up visit.  Recent office visits:  12/20/2022 OV (PCP) Willow Ora, MD; Restart septra prophylaxis.   10/24/2022 OV (PCP) Willow Ora, MD;  no medication changes.   Recent consult visits:  None   Hospital visits:  None in previous 6 months   Objective:  Lab Results  Component Value Date   CREATININE 1.57 (H) 07/26/2022   BUN 25 (H) 07/26/2022   GFR 33.09 (L) 07/26/2022   GFRNONAA 39 (L) 02/25/2022   GFRAA 56 (L) 02/01/2021   NA 132 (L) 07/26/2022   K 5.0 07/26/2022   CALCIUM 9.8 07/26/2022   CO2 23 07/26/2022   GLUCOSE 108 (H) 07/26/2022    Lab Results  Component Value Date/Time   HGBA1C 5.9 (A) 10/24/2022 03:48 PM   HGBA1C 5.7 (A) 07/16/2022 09:56 AM   HGBA1C 6.2 12/29/2021 09:25 AM   HGBA1C 6.6 (H) 05/10/2021 06:49 PM   GFR 33.09 (L) 07/26/2022 09:58 AM   GFR 38.98 (L) 07/16/2022 10:09 AM   MICROALBUR 141.4 (H) 03/29/2022 11:21 AM    Last diabetic Eye exam:  Lab Results  Component Value Date/Time   HMDIABEYEEXA No Retinopathy 06/18/2022 12:00 AM    Last diabetic Foot exam: No results found for: "HMDIABFOOTEX"   Lab Results  Component Value Date   CHOL 180 12/29/2021   HDL 47.60 12/29/2021   LDLCALC 78 09/15/2020   LDLDIRECT 94.0 12/29/2021   TRIG 299.0 (H) 12/29/2021   CHOLHDL 4 12/29/2021       Latest Ref Rng & Units 07/16/2022   10:09 AM 02/25/2022    5:05 PM 12/29/2021    9:25 AM  Hepatic Function  Total Protein 6.5 - 8.1 g/dL  7.1  6.2   Albumin 3.5 - 5.2 g/dL  4.1  3.7  4.1   AST 15 - 41 U/L  23  15   ALT 0 - 44 U/L  25  12   Alk Phosphatase 38 - 126 U/L  112  85   Total Bilirubin 0.3 - 1.2 mg/dL  0.8  0.5     Lab Results  Component Value Date/Time   TSH 1.96 12/20/2022 02:17 PM   TSH 2.91 12/29/2021 09:25 AM       Latest Ref Rng & Units 12/20/2022    2:17 PM 02/25/2022    5:05 PM 12/29/2021    9:25 AM  CBC  WBC 4.0 - 10.5 K/uL 5.0  9.1  6.4   Hemoglobin 12.0 - 15.0 g/dL 81.1  91.4  78.2   Hematocrit 36.0 - 46.0 % 38.9  35.7  41.7   Platelets 150.0 - 400.0 K/uL 319.0  229  259.0     Lab Results  Component Value Date/Time   VITAMINB12 405 09/17/2019 02:33 PM   VITAMINB12 439 05/26/2018 03:24 PM    Clinical ASCVD: {YES/NO:21197} The 10-year ASCVD risk score (Arnett DK, et al., 2019) is: 28.2%   Values used to calculate the score:     Age: 14 years     Sex:  Female     Is Non-Hispanic African American: No     Diabetic: Yes     Tobacco smoker: Yes     Systolic Blood Pressure: 108 mmHg     Is BP treated: Yes     HDL Cholesterol: 47.6 mg/dL     Total Cholesterol: 180 mg/dL    ***Other: ([id number] if Afib, MMRC or CAT for COPD, ACT, DEXA)     12/20/2022    1:45 PM 10/24/2022    1:39 PM 07/16/2022    9:50 AM  Depression screen PHQ 2/9  Decreased Interest 0 0 0  Down, Depressed, Hopeless 0 0 0  PHQ - 2 Score 0 0 0  Altered sleeping 0  0  Tired, decreased energy 0  0  Change in appetite 0  0  Feeling bad or failure about yourself  0  0  Trouble concentrating 0  0  Moving slowly or fidgety/restless 0  0  Suicidal thoughts 0  0  PHQ-9 Score 0  0  Difficult doing work/chores Not difficult at all  Somewhat difficult     Social History   Tobacco Use  Smoking Status Every Day   Packs/day: 0.50   Years: 48.00   Additional pack years: 0.00   Total pack years: 24.00   Types: Cigarettes  Smokeless Tobacco Never  Tobacco Comments   handouts provided    BP Readings from Last 3 Encounters:  12/20/22 108/68  10/24/22  136/76  07/16/22 118/62   Pulse Readings from Last 3 Encounters:  12/20/22 (!) 57  10/24/22 67  07/16/22 (!) 57   Wt Readings from Last 3 Encounters:  12/20/22 187 lb 3.2 oz (84.9 kg)  10/24/22 186 lb 9.6 oz (84.6 kg)  07/16/22 187 lb 9.6 oz (85.1 kg)   BMI Readings from Last 3 Encounters:  12/20/22 29.32 kg/m  10/24/22 29.23 kg/m  07/16/22 29.38 kg/m    No Known Allergies  Medications Reviewed Today     Reviewed by Leda Quail, CMA (Certified Medical Assistant) on 12/20/22 at 1347  Med List Status: <None>   Medication Order Taking? Sig Documenting Provider Last Dose Status Informant  albuterol (VENTOLIN HFA) 108 (90 Base) MCG/ACT inhaler [id number] Yes USE 2 INHALATIONS BY MOUTH EVERY 6 HOURS AS NEEDED FOR WHEEZING  OR SHORTNESS OF BREATH Willow Ora, MD Taking Active   ALPRAZolam Prudy Feeler) 0.5 MG tablet [id number] Yes TAKE ONE TABLET BY MOUTH DAILY AS NEEDED FOR ANXIETY Willow Ora, MD Taking Active   aspirin 81 MG tablet [id number] Yes Take 81 mg by mouth at bedtime. [provider] Taking Active Self  atorvastatin (LIPITOR) 40 MG tablet [id number] Yes Take 1 tablet (40 mg total) by mouth at bedtime. Willow Ora, MD Taking Active   cloNIDine (CATAPRES) 0.1 MG tablet [id number] Yes Take 1 tablet (0.1 mg total) by mouth 3 (three) times daily. Willow Ora, MD Taking Active   cyclobenzaprine (FLEXERIL) 10 MG tablet [id number] Yes TAKE ONE TABLET BY MOUTH AT BEDTIME AS NEEDED FOR MUSCLE SPASMS Willow Ora, MD Taking Active   dapagliflozin propanediol (FARXIGA) 10 MG TABS tablet [id number] Yes Take 1 tablet (10 mg total) by mouth daily before breakfast. Willow Ora, MD Taking Active   diphenhydrAMINE (BENADRYL) 25 MG tablet [id number] Yes Take 25 mg by mouth every 6 (six) hours as needed for allergies or sleep. [provider] Taking Active Self  lisinopril (ZESTRIL) 40 MG tablet [id number] Yes Take 1 tablet (40 mg total)  by mouth daily. Willow Ora, MD Taking Active   meloxicam [facility]) 15 MG tablet [id number] Yes Take 1 tablet (15 mg total) by mouth daily. Willow Ora, MD Taking Active   Multiple Vitamins-Minerals (MULTIVITAMIN WITH MINERALS) tablet [id number] Yes Take 1 tablet by mouth daily. Centrum silver for women [provider] Taking Active Self  nitroGLYCERIN (NITROSTAT) 0.4 MG SL tablet [id number]  Place 1 tablet (0.4 mg total) under the tongue every 5 (five) minutes as needed for chest pain. Rosalio Macadamia, NP  Expired 11/24/21 2359 Self           Med Note Jomarie Longs, REGEENA   Wed May 10, 2021  2:35 PM) Needs refill  promethazine (PHENERGAN) 25 MG tablet [id number] Yes Take 1 tablet (25 mg total) by mouth every 6 (six) hours as needed for nausea or vomiting. Willow Ora, MD Taking Active   propranolol (INDERAL) 40 MG tablet [id number] Yes Take 1 tablet (40 mg total) by mouth 2 (two) times daily as needed (tremor). Willow Ora, MD Taking Active   sulfamethoxazole-trimethoprim (BACTRIM DS) 800-160 MG tablet [id number] Yes Take 1 tablet by mouth daily. Willow Ora, MD Taking Active   venlafaxine XR (EFFEXOR-XR) 75 MG 24 hr capsule [id number] Yes TAKE 3 CAPSULES BY MOUTH  DAILY WITH BREAKFAST Willow Ora, MD Taking Active             SDOH:  (Social Determinants of Health) assessments and interventions performed: {yes/no:20286} SDOH Interventions    Flowsheet Row Clinical Support from 06/07/2022 in [facility] Visit from 03/29/2022 in [place] [facility] Clinical Support from 08/06/2018 in [place] [facility] Office Visit from 05/26/2018 in [place] [facility] Visit from 12/02/2017 in [place] [facility]  SDOH Interventions       Depression Interventions/Treatment  Currently on Treatment Currently on Treatment Medication Medication, Counseling Currently on Treatment, Counseling       Medication Assistance:  {MEDASSISTANCEINFO:25044}  Medication Access: Name and location of current pharmacy:  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 [place] [address] [place] [address] Phone: [phone] Fax: [phone]  [facility] [place], [place] - [address] [facility] [address] [place] [address] Phone: [phone] Fax: [phone]  [facility] Delivery - [place], [place] - [address] [place] [address] Phone: [phone] Fax: [phone]  Within the past 30 days, how often has patient missed a dose of medication? *** Is a pillbox or other method used to improve adherence? {YES/NO:21197} Factors that may affect medication adherence? {CHL DESC; BARRIERS:21522} Are meds synced by current pharmacy? {YES/NO:21197} Are meds delivered by current pharmacy? {YES/NO:21197} Does patient experience delays in picking up medications due to transportation concerns? {YES/NO:21197}  Compliance/Adherence/Medication fill history: Care Gaps: ***  Star-Rating Drugs: ***   Assessment/Plan     Hypertension (BP goal <140/90) -Controlled -Current treatment: Lisinopril 40mg  daily Clonidine 0.1mg  daily -Medications previously tried: none noted  -Current home readings: no logs, office BP normal recently  -Denies hypotensive/hypertensive symptoms -Educated on BP goals and benefits of medications for prevention of heart attack, stroke and kidney damage; Daily salt intake goal < 2300 mg; Exercise goal of 150 minutes per week; Importance  of home blood pressure monitoring; Symptoms of hypotension and importance of maintaining adequate hydration; -Counseled to monitor BP at home a few times per week at least, document, and  provide log at future appointments -Patient mentions some recent falls due to accidental falls, no dizziness. -Recommended to continue current medication Recommended increased home monitoring, could consider addition of amlodipine if BP continues to be elevated  Osteoporosis / Osteopenia (Goal Maintain bone density) -Controlled -Last DEXA Scan: 08/25/20   T-Score femoral neck: -1.5   -Patient is not a candidate for pharmacologic treatment -Current treatment  None currently -Medications previously tried: none noted  -Recommend [phone] units of vitamin D daily. Recommend 1200 mg of calcium daily from dietary and supplemental sources. Recommend weight-bearing and muscle strengthening exercises for building and maintaining bone density. -Recommended repeat DEXA 2 years from previous  Diabetes (A1c goal <7%) -Controlled -Current medications: Metformin 1000mg  BID with meals -Medications previously tried: none noted  -Current home glucose readings fasting glucose: no logs post prandial glucose: no logs -Denies hypoglycemic/hyperglycemic symptoms  -Educated on A1c and blood sugar goals; Complications of diabetes including kidney damage, retinal damage, and cardiovascular disease; Exercise goal of 150 minutes per week; Prevention and management of hypoglycemic episodes; -Counseled to check feet daily and get yearly eye exams -Counseled on diet and exercise extensively Recommended to continue current medication       Willa Frater, PharmD Clinical Pharmacist  [facility] [phone]

## 2023-05-02 ENCOUNTER — Telehealth: Payer: Self-pay | Admitting: Pharmacist

## 2023-05-02 NOTE — Progress Notes (Signed)
Care Management & Coordination Services Pharmacy Team  Reason for Encounter: Appointment Reminder  Contacted patient to confirm telephone appointment with Erskine Emery, PharmD on 05/03/2023 at 12 pm. Unsuccessful outreach. Left voicemail with appointment details.   Star Rating Drugs:  Atorvastatin 20 mg last filled 01/11/2023 90 DS Farxiga 10 mg last filled 04/03/2023 30 DS Lisinopril 40 mg last filled 04/03/2023 100 DS   Care Gaps: Annual wellness visit in last year? Yes  If Diabetic: Last eye exam / retinopathy screening: 06/18/2022 Last diabetic foot exam: 12/29/2021   Future Appointments  Date Time Provider Department Center  05/03/2023 12:00 PM Erroll Luna [facility] None  06/17/2023 11:45 AM [facility] [place] VISIT 1 [facility]   April D Calhoun, [facility] Clinical Pharmacist Assistant [phone]

## 2023-05-03 ENCOUNTER — Other Ambulatory Visit: Payer: Self-pay | Admitting: Family Medicine

## 2023-05-03 ENCOUNTER — Ambulatory Visit: Payer: Medicare Other | Admitting: Pharmacist

## 2023-05-03 DIAGNOSIS — E1169 Type 2 diabetes mellitus with other specified complication: Secondary | ICD-10-CM

## 2023-05-03 NOTE — Progress Notes (Unsigned)
Care Management & Coordination Services Pharmacy Note  05/03/2023 Name:  Jessica Curry MRN:  [id number] DOB:  March 13, 1951  Summary: ***  Recommendations/Changes made from today's visit: ***  Follow up plan: ***   Subjective: Jessica Curry is an 72 y.o. year old female who is a primary patient of Willow Ora, MD.  The care coordination team was consulted for assistance with disease management and care coordination needs.    Engaged with patient by telephone for follow up visit.  Recent office visits: ***  Recent consult visits: ***  Hospital visits: {Hospital DC Yes/No:25215}   Objective:  Lab Results  Component Value Date   CREATININE 1.57 (H) 07/26/2022   BUN 25 (H) 07/26/2022   GFR 33.09 (L) 07/26/2022   GFRNONAA 39 (L) 02/25/2022   GFRAA 56 (L) 02/01/2021   NA 132 (L) 07/26/2022   K 5.0 07/26/2022   CALCIUM 9.8 07/26/2022   CO2 23 07/26/2022   GLUCOSE 108 (H) 07/26/2022    Lab Results  Component Value Date/Time   HGBA1C 5.9 (A) 10/24/2022 03:48 PM   HGBA1C 5.7 (A) 07/16/2022 09:56 AM   HGBA1C 6.2 12/29/2021 09:25 AM   HGBA1C 6.6 (H) 05/10/2021 06:49 PM   GFR 33.09 (L) 07/26/2022 09:58 AM   GFR 38.98 (L) 07/16/2022 10:09 AM   MICROALBUR 141.4 (H) 03/29/2022 11:21 AM    Last diabetic Eye exam:  Lab Results  Component Value Date/Time   HMDIABEYEEXA No Retinopathy 06/18/2022 12:00 AM    Last diabetic Foot exam: No results found for: "HMDIABFOOTEX"   Lab Results  Component Value Date   CHOL 180 12/29/2021   HDL 47.60 12/29/2021   LDLCALC 78 09/15/2020   LDLDIRECT 94.0 12/29/2021   TRIG 299.0 (H) 12/29/2021   CHOLHDL 4 12/29/2021       Latest Ref Rng & Units 07/16/2022   10:09 AM 02/25/2022    5:05 PM 12/29/2021    9:25 AM  Hepatic Function  Total Protein 6.5 - 8.1 g/dL  7.1  6.2   Albumin 3.5 - 5.2 g/dL 4.1  3.7  4.1   AST 15 - 41 U/L  23  15   ALT 0 - 44 U/L  25  12   Alk Phosphatase 38 - 126 U/L  112  85   Total Bilirubin 0.3 - 1.2  mg/dL  0.8  0.5     Lab Results  Component Value Date/Time   TSH 1.96 12/20/2022 02:17 PM   TSH 2.91 12/29/2021 09:25 AM       Latest Ref Rng & Units 12/20/2022    2:17 PM 02/25/2022    5:05 PM 12/29/2021    9:25 AM  CBC  WBC 4.0 - 10.5 K/uL 5.0  9.1  6.4   Hemoglobin 12.0 - 15.0 g/dL 73.7  10.6  26.9   Hematocrit 36.0 - 46.0 % 38.9  35.7  41.7   Platelets 150.0 - 400.0 K/uL 319.0  229  259.0     Lab Results  Component Value Date/Time   VITAMINB12 405 09/17/2019 02:33 PM   VITAMINB12 439 05/26/2018 03:24 PM    Clinical ASCVD: {YES/NO:21197} The 10-year ASCVD risk score (Arnett DK, et al., 2019) is: 28.2%   Values used to calculate the score:     Age: 71 years     Sex: Female     Is Non-Hispanic African American: No     Diabetic: Yes     Tobacco smoker: Yes     Systolic Blood  Pressure: 108 mmHg     Is BP treated: Yes     HDL Cholesterol: 47.6 mg/dL     Total Cholesterol: 180 mg/dL    ***Other: ([id number] if Afib, MMRC or CAT for COPD, ACT, DEXA)     12/20/2022    1:45 PM 10/24/2022    1:39 PM 07/16/2022    9:50 AM  Depression screen PHQ 2/9  Decreased Interest 0 0 0  Down, Depressed, Hopeless 0 0 0  PHQ - 2 Score 0 0 0  Altered sleeping 0  0  Tired, decreased energy 0  0  Change in appetite 0  0  Feeling bad or failure about yourself  0  0  Trouble concentrating 0  0  Moving slowly or fidgety/restless 0  0  Suicidal thoughts 0  0  PHQ-9 Score 0  0  Difficult doing work/chores Not difficult at all  Somewhat difficult     Social History   Tobacco Use  Smoking Status Every Day   Packs/day: 0.50   Years: 48.00   Additional pack years: 0.00   Total pack years: 24.00   Types: Cigarettes  Smokeless Tobacco Never  Tobacco Comments   handouts provided    BP Readings from Last 3 Encounters:  12/20/22 108/68  10/24/22 136/76  07/16/22 118/62   Pulse Readings from Last 3 Encounters:  12/20/22 (!) 57  10/24/22 67  07/16/22 (!) 57   Wt Readings from  Last 3 Encounters:  12/20/22 187 lb 3.2 oz (84.9 kg)  10/24/22 186 lb 9.6 oz (84.6 kg)  07/16/22 187 lb 9.6 oz (85.1 kg)   BMI Readings from Last 3 Encounters:  12/20/22 29.32 kg/m  10/24/22 29.23 kg/m  07/16/22 29.38 kg/m    No Known Allergies  Medications Reviewed Today     Reviewed by Leda Quail, CMA (Certified Medical Assistant) on 12/20/22 at 1347  Med List Status: <None>   Medication Order Taking? Sig Documenting Provider Last Dose Status Informant  albuterol (VENTOLIN HFA) 108 (90 Base) MCG/ACT inhaler [id number] Yes USE 2 INHALATIONS BY MOUTH EVERY 6 HOURS AS NEEDED FOR WHEEZING  OR SHORTNESS OF BREATH Willow Ora, MD Taking Active   ALPRAZolam Prudy Feeler) 0.5 MG tablet [id number] Yes TAKE ONE TABLET BY MOUTH DAILY AS NEEDED FOR ANXIETY Willow Ora, MD Taking Active   aspirin 81 MG tablet [id number] Yes Take 81 mg by mouth at bedtime. [provider] Taking Active Self  atorvastatin (LIPITOR) 40 MG tablet [id number] Yes Take 1 tablet (40 mg total) by mouth at bedtime. Willow Ora, MD Taking Active   cloNIDine (CATAPRES) 0.1 MG tablet [id number] Yes Take 1 tablet (0.1 mg total) by mouth 3 (three) times daily. Willow Ora, MD Taking Active   cyclobenzaprine (FLEXERIL) 10 MG tablet [id number] Yes TAKE ONE TABLET BY MOUTH AT BEDTIME AS NEEDED FOR MUSCLE SPASMS Willow Ora, MD Taking Active   dapagliflozin propanediol (FARXIGA) 10 MG TABS tablet [id number] Yes Take 1 tablet (10 mg total) by mouth daily before breakfast. Willow Ora, MD Taking Active   diphenhydrAMINE (BENADRYL) 25 MG tablet [id number] Yes Take 25 mg by mouth every 6 (six) hours as needed for allergies or sleep. [provider] Taking Active Self  lisinopril (ZESTRIL) 40 MG tablet [id number] Yes Take 1 tablet (40 mg total) by mouth daily. Willow Ora, MD Taking Active   meloxicam [facility]) 15 MG tablet [id number] Yes Take 1 tablet (15 mg total) by mouth daily. Asencion Partridge  L, MD  Taking Active   Multiple Vitamins-Minerals (MULTIVITAMIN WITH MINERALS) tablet [id number] Yes Take 1 tablet by mouth daily. Centrum silver for women [provider] Taking Active Self  nitroGLYCERIN (NITROSTAT) 0.4 MG SL tablet [id number]  Place 1 tablet (0.4 mg total) under the tongue every 5 (five) minutes as needed for chest pain. Rosalio Macadamia, NP  Expired 11/24/21 2359 Self           Med Note Jomarie Longs, REGEENA   Wed May 10, 2021  2:35 PM) Needs refill  promethazine (PHENERGAN) 25 MG tablet [id number] Yes Take 1 tablet (25 mg total) by mouth every 6 (six) hours as needed for nausea or vomiting. Willow Ora, MD Taking Active   propranolol (INDERAL) 40 MG tablet [id number] Yes Take 1 tablet (40 mg total) by mouth 2 (two) times daily as needed (tremor). Willow Ora, MD Taking Active   sulfamethoxazole-trimethoprim (BACTRIM DS) 800-160 MG tablet [id number] Yes Take 1 tablet by mouth daily. Willow Ora, MD Taking Active   venlafaxine XR (EFFEXOR-XR) 75 MG 24 hr capsule [id number] Yes TAKE 3 CAPSULES BY MOUTH  DAILY WITH BREAKFAST Willow Ora, MD Taking Active             SDOH:  (Social Determinants of Health) assessments and interventions performed: {yes/no:20286} SDOH Interventions    Flowsheet Row Clinical Support from 06/07/2022 in [facility] [facility] Visit from 03/29/2022 in [place] [facility] Clinical Support from 08/06/2018 in [place] [facility] Office Visit from 05/26/2018 in [place] [facility] Visit from 12/02/2017 in [place] [facility]  SDOH Interventions       Depression Interventions/Treatment  Currently on Treatment Currently on Treatment Medication Medication, Counseling Currently on Treatment, Counseling       Medication Assistance: {MEDASSISTANCEINFO:25044}  Medication Access: Name and location of current pharmacy:  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 [address] [place] [address] Phone: [phone] Fax: [phone]  [facility] Delivery) - [place], [place] - [address] [facility] [address] [place] [address] [place] [address] Phone: [phone] Fax: [phone]  [facility] [place], [place] - [address] [place] [address] Phone: [phone] Fax: [phone]  [facility] Delivery - [place], [place] - [address] [place] [address] Phone: [phone] Fax: [phone]  Within the past 30 days, how often has patient missed a dose of medication? *** Is a pillbox or other method used to improve adherence? {YES/NO:21197} Factors that may affect medication adherence? {CHL DESC; BARRIERS:21522} Are meds synced by current pharmacy? {YES/NO:21197} Are meds delivered by current pharmacy? {YES/NO:21197} Does patient experience delays in picking up medications due to transportation concerns? {YES/NO:21197}  Compliance/Adherence/Medication fill history: Care Gaps: ***  Star-Rating Drugs: ***   Assessment/Plan     Hypertension (BP goal <140/90) -Controlled -Current treatment: Lisinopril 40mg  daily Clonidine 0.1mg  daily -Medications previously tried: none noted  -Current home readings: no logs, office BP normal recently  -Denies hypotensive/hypertensive symptoms -Educated on BP goals and benefits of medications for prevention of heart attack, stroke and kidney damage; Daily salt intake goal < 2300 mg; Exercise goal of 150 minutes per week; Importance of home blood pressure monitoring; Symptoms of hypotension and importance of maintaining adequate hydration; -Counseled to monitor BP at home a few times per week at least, document, and  provide log at future appointments -Patient mentions some recent falls due to accidental falls, no dizziness. -Recommended to continue  current medication Recommended increased home monitoring, could consider addition of amlodipine if BP continues to be elevated  Osteoporosis / Osteopenia (Goal Maintain bone density) -Controlled -Last DEXA Scan: 08/25/20   T-Score femoral neck: -1.5   -Patient is not a candidate for pharmacologic treatment -Current treatment  None currently -Medications previously tried: none noted  -Recommend [phone] units of vitamin D daily. Recommend 1200 mg of calcium daily from dietary and supplemental sources. Recommend weight-bearing and muscle strengthening exercises for building and maintaining bone density. -Recommended repeat DEXA 2 years from previous  Diabetes (A1c goal <7%) -Controlled -Current medications: Farxiga 10mg  {Appropriate:26852::"Appropriate"}, {Effective:26853::"Effective"}, {Safe:26854::"Safe"}, {accessible:26855::"Accessible"} -Medications previously tried: none noted  -Current home glucose readings fasting glucose:  post prandial glucose: no logs -Denies hypoglycemic/hyperglycemic symptoms  -Educated on A1c and blood sugar goals; Complications of diabetes including kidney damage, retinal damage, and cardiovascular disease; Exercise goal of 150 minutes per week; Prevention and management of hypoglycemic episodes; -Counseled to check feet daily and get yearly eye exams -Counseled on diet and exercise extensively Recommended to continue current medication       Willa Frater, PharmD Clinical Pharmacist  [facility] [phone]

## 2023-05-06 ENCOUNTER — Other Ambulatory Visit: Payer: Self-pay | Admitting: Family Medicine

## 2023-05-06 DIAGNOSIS — E1169 Type 2 diabetes mellitus with other specified complication: Secondary | ICD-10-CM

## 2023-05-07 ENCOUNTER — Other Ambulatory Visit: Payer: Self-pay | Admitting: Family Medicine

## 2023-05-07 DIAGNOSIS — E785 Hyperlipidemia, unspecified: Secondary | ICD-10-CM

## 2023-05-15 ENCOUNTER — Ambulatory Visit: Payer: Medicare Other | Admitting: Family Medicine

## 2023-06-11 ENCOUNTER — Other Ambulatory Visit: Payer: Self-pay | Admitting: Family Medicine

## 2023-06-20 ENCOUNTER — Ambulatory Visit (INDEPENDENT_AMBULATORY_CARE_PROVIDER_SITE_OTHER): Payer: Medicare Other

## 2023-06-20 VITALS — Wt 187.0 lb

## 2023-06-20 DIAGNOSIS — Z0000 Encounter for general adult medical examination without abnormal findings: Secondary | ICD-10-CM | POA: Diagnosis not present

## 2023-06-20 DIAGNOSIS — E2839 Other primary ovarian failure: Secondary | ICD-10-CM | POA: Diagnosis not present

## 2023-06-20 DIAGNOSIS — Z1231 Encounter for screening mammogram for malignant neoplasm of breast: Secondary | ICD-10-CM

## 2023-06-20 NOTE — Progress Notes (Addendum)
Subjective:   Jessica Curry is a 72 y.o. female who presents for Medicare Annual (Subsequent) preventive examination.  Vital Signs: Unable to obtain new vitals due to this being a telehealth visit.   Visit Complete: Virtual  I connected with  Horton Chin on 06/20/23 by a audio enabled telemedicine application and verified that I am speaking with the correct person using two identifiers.  Patient Location: Home  Provider Location: Office/Clinic  I discussed the limitations of evaluation and management by telemedicine. The patient expressed understanding and agreed to proceed.   Review of Systems     Cardiac Risk Factors include: advanced age (>24men, >49 women);diabetes mellitus;dyslipidemia;hypertension;smoking/ tobacco exposure     Objective:    Today's Vitals   06/20/23 1512  Weight: 187 lb (84.8 kg)   Body mass index is 29.29 kg/m.     06/20/2023    3:24 PM 06/07/2022   11:11 AM 02/25/2022    4:50 PM 05/26/2021    9:06 AM 05/10/2021    4:08 PM 02/06/2021   11:27 AM 09/08/2019   12:38 PM  Advanced Directives  Does Patient Have a Medical Advance Directive? Yes No No Yes No Yes No  Type of Estate agent of [place];Living will     Healthcare Power of [place];Living will   Does patient want to make changes to medical advance directive?    Yes (MAU/Ambulatory/Procedural Areas - Information given)  No - Patient declined   Copy of Healthcare Power of Attorney in Chart? No - copy requested        Would patient like information on creating a medical advance directive?  Yes (MAU/Ambulatory/Procedural Areas - Information given) Yes (ED - Information included in AVS)  No - Patient declined  Yes (MAU/Ambulatory/Procedural Areas - Information given)    Current Medications (verified) Outpatient Encounter Medications as of 06/20/2023  Medication Sig   albuterol (VENTOLIN HFA) 108 (90 Base) MCG/ACT inhaler USE 2 INHALATIONS BY MOUTH EVERY 6 HOURS AS NEEDED  FOR WHEEZING  OR SHORTNESS OF BREATH   ALPRAZolam (XANAX) 0.5 MG tablet TAKE ONE TABLET BY MOUTH DAILY AS NEEDED FOR ANXIETY   amLODipine (NORVASC) 5 MG tablet Take 1 tablet (5 mg total) by mouth daily.   aspirin 81 MG tablet Take 81 mg by mouth at bedtime.   atorvastatin (LIPITOR) 40 MG tablet Take 1 tablet (40 mg total) by mouth at bedtime.   cloNIDine (CATAPRES) 0.1 MG tablet Take 1 tablet (0.1 mg total) by mouth 3 (three) times daily.   cyclobenzaprine (FLEXERIL) 10 MG tablet TAKE ONE TABLET BY MOUTH AT BEDTIME AS NEEDED FOR MUSCLE SPASMS   dapagliflozin propanediol (FARXIGA) 10 MG TABS tablet Take 1 tablet (10 mg total) by mouth daily before breakfast.   diphenhydrAMINE (BENADRYL) 25 MG tablet Take 25 mg by mouth every 6 (six) hours as needed for allergies or sleep.   lisinopril (ZESTRIL) 40 MG tablet TAKE 1 TABLET BY MOUTH DAILY   meloxicam (MOBIC) 15 MG tablet Take 1 tablet (15 mg total) by mouth daily.   Multiple Vitamins-Minerals (MULTIVITAMIN WITH MINERALS) tablet Take 1 tablet by mouth daily. Centrum silver for women   promethazine (PHENERGAN) 25 MG tablet Take 1 tablet (25 mg total) by mouth every 6 (six) hours as needed for nausea or vomiting.   sulfamethoxazole-trimethoprim (BACTRIM DS) 800-160 MG tablet Take 1 tablet by mouth daily.   venlafaxine XR (EFFEXOR-XR) 75 MG 24 hr capsule TAKE 3 CAPSULES BY MOUTH  DAILY WITH BREAKFAST  nitroGLYCERIN (NITROSTAT) 0.4 MG SL tablet Place 1 tablet (0.4 mg total) under the tongue every 5 (five) minutes as needed for chest pain.   No facility-administered encounter medications on file as of 06/20/2023.    Allergies (verified) Patient has no known allergies.   History: Past Medical History:  Diagnosis Date   Allergy    Asthma    Chicken pox    Depression    reasonable control on venlafaxine 75mg  TID. xanax #14 lasts about 2 years for anxiety portion.    Diabetes mellitus    Genital warts    no outbreaks since the 80s   History of  heart artery stent 02/10/2020   Hyperlipidemia    Hypertension    Past Surgical History:  Procedure Laterality Date   CORONARY ANGIOPLASTY WITH STENT PLACEMENT     2012, [place]   DILATION AND CURETTAGE OF UTERUS     TONSILLECTOMY  1968   Family History  Problem Relation Age of Onset   Arthritis Mother    Ovarian cancer Mother    AAA (abdominal aortic aneurysm) Mother    Kidney disease Mother    Hypothyroidism Mother    Other Father        does not know family history for father   Stroke Brother        retired MD. [employer]   Heart attack Brother    Arthritis Brother        hip replacement   Hyperlipidemia Brother    Hypertension Brother    Dementia Maternal Grandmother    Colon cancer Neg Hx    Esophageal cancer Neg Hx    Rectal cancer Neg Hx    Stomach cancer Neg Hx    Social History   Socioeconomic History   Marital status: Divorced    Spouse name: Not on file   Number of children: Not on file   Years of education: Not on file   Highest education level: Not on file  Occupational History   Not on file  Tobacco Use   Smoking status: Every Day    Current packs/day: 0.50    Average packs/day: 0.5 packs/day for 48.0 years (24.0 ttl pk-yrs)    Types: Cigarettes   Smokeless tobacco: Never   Tobacco comments:    handouts provided   Vaping Use   Vaping status: Never Used  Substance and Sexual Activity   Alcohol use: Yes    Alcohol/week: 4.0 standard drinks of alcohol    Types: 4 Cans of beer per week   Drug use: No   Sexual activity: Yes  Other Topics Concern   Not on file  Social History Narrative   Divorced. Did not ask about children.       Lives with mother   BS undergrad.    Now struggles to find work and has financial struggles- getting on medicare has helped.    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 Does not "mow or blow"   Social Determinants of Health    Financial Resource Strain: Low Risk  (06/20/2023)   Overall Financial Resource Strain (CARDIA)    Difficulty of Paying Living Expenses: Not hard at all  Food Insecurity: No Food Insecurity (06/20/2023)   Hunger Vital Sign    Worried About Running Out of Food in the Last Year: Never true    Ran Out of Food in the Last Year:  Never true  Transportation Needs: No Transportation Needs (06/20/2023)   PRAPARE - Administrator, Civil Service (Medical): No    Lack of Transportation (Non-Medical): No  Physical Activity: Insufficiently Active (06/20/2023)   Exercise Vital Sign    Days of Exercise per Week: 5 days    Minutes of Exercise per Session: 20 min  Stress: Stress Concern Present (06/20/2023)   [employer] of Occupational Health - Occupational Stress Questionnaire    Feeling of Stress : To some extent  Social Connections: Moderately Isolated (06/20/2023)   Social Connection and Isolation Panel [NHANES]    Frequency of Communication with Friends and Family: Three times a week    Frequency of Social Gatherings with Friends and Family: Twice a week    Attends Religious Services: 1 to 4 times per year    Active Member of [employer] or Organizations: No    Attends Engineer, structural: Never    Marital Status: Divorced    Tobacco Counseling Ready to quit: Not Answered Counseling given: Not Answered Tobacco comments: handouts provided    Clinical Intake:  Pre-visit preparation completed: Yes  Pain : No/denies pain     BMI - recorded: 29.29 Nutritional Status: BMI 25 -29 Overweight Nutritional Risks: None Diabetes: Yes CBG done?: No Did pt. bring in CBG monitor from home?: No  How often do you need to have someone help you when you read instructions, pamphlets, or other written materials from your doctor or pharmacy?: 1 - Never  Interpreter Needed?: No  Information entered by :: Lanier Ensign, LPN   Activities of Daily Living    06/20/2023    3:16 PM   In your present state of health, do you have any difficulty performing the following activities:  Hearing? 0  Vision? 0  Difficulty concentrating or making decisions? 0  Walking or climbing stairs? 0  Dressing or bathing? 0  Doing errands, shopping? 0  Preparing Food and eating ? N  Using the Toilet? N  In the past six months, have you accidently leaked urine? N  Comment still wears a pad  Do you have problems with loss of bowel control? N  Managing your Medications? N  Managing your Finances? N  Housekeeping or managing your Housekeeping? N    Patient Care Team: Willow Ora, MD as PCP - General (Family Medicine) Jake Bathe, MD as PCP - Cardiology (Cardiology)  Indicate any recent Medical Services you may have received from other than [facility] providers in the past year (date may be approximate).     Assessment:   This is a routine wellness examination for [facility].  Hearing/Vision screen Hearing Screening - Comments:: Pt denies any hearing issues  Vision Screening - Comments:: Pt follows up with Dr Sinda Du for annual eye exam   Dietary issues and exercise activities discussed:     Goals Addressed             This Visit's Progress    Patient Stated       None at this time        Depression Screen    06/20/2023    3:20 PM 12/20/2022    1:45 PM 10/24/2022    1:39 PM 07/16/2022    9:50 AM 06/07/2022   11:07 AM 06/01/2022    1:30 PM 03/29/2022   10:49 AM  PHQ 2/9 Scores  PHQ - 2 Score 2 0 0 0 1 0 5  PHQ- 9 Score 4 0  0 5  10    Fall Risk    06/20/2023    3:25 PM 12/20/2022    1:45 PM 10/24/2022    1:38 PM 07/16/2022    9:49 AM 06/07/2022   11:13 AM  Fall Risk   Falls in the past year? 0 1 1 1 1   Number falls in past yr: 0 0 0 0 1  Injury with Fall? 0 1 0 0 1  Comment     bruised  Risk for fall due to : Impaired balance/gait;Impaired vision No Fall Risks No Fall Risks History of fall(s) Impaired vision;Impaired balance/gait  Follow up Falls prevention  discussed Falls evaluation completed Falls evaluation completed Falls evaluation completed Falls prevention discussed    MEDICARE RISK AT HOME:  Medicare Risk at Home - 06/20/23 1525     Any stairs in or around the home? No    If so, are there any without handrails? No    Home free of loose throw rugs in walkways, pet beds, electrical cords, etc? Yes    Adequate lighting in your home to reduce risk of falls? Yes    Life alert? No    Use of a cane, walker or w/c? No    Grab bars in the bathroom? No    Shower chair or bench in shower? No    Elevated toilet seat or a handicapped toilet? No             TIMED UP AND GO:  Was the test performed?  No    Cognitive Function:        06/20/2023    3:26 PM 06/07/2022   11:16 AM 05/26/2021    9:12 AM 09/08/2019   12:43 PM 08/06/2018    3:47 PM  6CIT Screen  What Year? 0 points 0 points 0 points 0 points 0 points  What month? 0 points 0 points 0 points 0 points 0 points  What time? 0 points 0 points 0 points 0 points 0 points  Count back from 20 0 points 0 points 0 points 0 points 0 points  Months in reverse 0 points 0 points 0 points 0 points 0 points  Repeat phrase 0 points 2 points 0 points 0 points 0 points  Total Score 0 points 2 points 0 points 0 points 0 points    Immunizations Immunization History  Administered Date(s) Administered   Fluad Quad(high Dose 65+) 08/05/2019, 08/18/2020, 08/09/2021, 10/24/2022   Influenza, High Dose Seasonal PF 08/20/2017, 08/06/2018   Influenza-Unspecified 09/24/2016   PFIZER Comirnaty(Gray Top)Covid-19 Tri-Sucrose Vaccine 04/10/2021   PFIZER(Purple Top)SARS-COV-2 Vaccination 12/15/2019, 01/05/2020   Pfizer Covid-19 Vaccine Bivalent Booster 70yrs & up 09/04/2021   Pneumococcal Conjugate-13 09/10/2016   Pneumococcal Polysaccharide-23 12/02/2017   Tdap 08/20/2018, 12/05/2018   Zoster Recombinant(Shingrix) 06/05/2017, 09/18/2017    TDAP status: Up to date  Flu Vaccine status: Up to  date  Pneumococcal vaccine status: Up to date  Covid-19 vaccine status: Completed vaccines  Qualifies for Shingles Vaccine? Yes   Zostavax completed Yes   Shingrix Completed?: Yes  Screening Tests Health Maintenance  Topic Date Due   COVID-19 Vaccine (5 - 2023-24 season) 07/27/2022   DEXA SCAN  08/25/2022   FOOT EXAM  12/29/2022   MAMMOGRAM  02/08/2023   Diabetic kidney evaluation - Urine ACR  03/30/2023   HEMOGLOBIN A1C  04/24/2023   OPHTHALMOLOGY EXAM  06/19/2023   Colonoscopy  08/13/2023   INFLUENZA VACCINE  06/27/2023   Diabetic kidney evaluation - eGFR  measurement  07/27/2023   Lung Cancer Screening  03/31/2024   Medicare Annual Wellness (AWV)  06/19/2024   DTaP/Tdap/Td (3 - Td or Tdap) 12/05/2028   Pneumonia Vaccine 34+ Years old  Completed   Zoster Vaccines- Shingrix  Completed   Hepatitis C Screening  Addressed   HPV VACCINES  Aged Out    Health Maintenance  Health Maintenance Due  Topic Date Due   COVID-19 Vaccine (5 - 2023-24 season) 07/27/2022   DEXA SCAN  08/25/2022   FOOT EXAM  12/29/2022   MAMMOGRAM  02/08/2023   Diabetic kidney evaluation - Urine ACR  03/30/2023   HEMOGLOBIN A1C  04/24/2023   OPHTHALMOLOGY EXAM  06/19/2023   Colonoscopy  08/13/2023    Colorectal cancer screening: Type of screening: Colonoscopy. Completed 08/12/18. Repeat every 5 years  Mammogram status: Ordered 06/20/23. Pt provided with contact info and advised to call to schedule appt.   Bone Density status: Ordered 06/20/23. Pt provided with contact info and advised to call to schedule appt.  Lung Cancer Screening: (Low Dose CT Chest recommended if Age 47-80 years, 20 pack-year currently smoking OR have quit w/in 15years.) does qualify.   Lung Cancer Screening Referral: ordered 04/05/23 and completed   Additional Screening:  Hepatitis C Screening: Completed 2005 declined repeat   Vision Screening: Recommended annual ophthalmology exams for early detection of glaucoma and other  disorders of the eye. Is the patient up to date with their annual eye exam?  No  Who is the provider or what is the name of the office in which the patient attends annual eye exams? Dr Cathey Endow  If pt is not established with a provider, would they like to be referred to a provider to establish care? No .   Dental Screening: Recommended annual dental exams for proper oral hygiene  Diabetic Foot Exam: Diabetic Foot Exam: Overdue, Pt has been advised about the importance in completing this exam. Pt is scheduled for diabetic foot exam on next appt .  Community Resource Referral / Chronic Care Management: CRR required this visit?  No   CCM required this visit?  No     Plan:     I have personally reviewed and noted the following in the patient's chart:   Medical and social history Use of alcohol, tobacco or illicit drugs  Current medications and supplements including opioid prescriptions. Patient is not currently taking opioid prescriptions. Functional ability and status Nutritional status Physical activity Advanced directives List of other physicians Hospitalizations, surgeries, and ER visits in previous 12 months Vitals Screenings to include cognitive, depression, and falls Referrals and appointments  In addition, I have reviewed and discussed with patient certain preventive protocols, quality metrics, and best practice recommendations. A written personalized care plan for preventive services as well as general preventive health recommendations were provided to patient.     Marzella Schlein, LPN   04/29/5408   After Visit Summary: (Declined) Due to this being a telephonic visit, with patients personalized plan was offered to patient but patient Declined AVS at this time   Nurse Notes: none

## 2023-06-20 NOTE — Patient Instructions (Signed)
Jessica Curry , Thank you for taking time to come for your Medicare Wellness Visit. I appreciate your ongoing commitment to your health goals. Please review the following plan we discussed and let me know if I can assist you in the future.   Referrals/Orders/Follow-Ups/Clinician Recommendations: orders placed for bone scan and mammogram to be completed same time pt postponed colonoscopy until date due, pt wil also follow up with future covid recommendations.   This is a list of the screening recommended for you and due dates:  Health Maintenance  Topic Date Due   COVID-19 Vaccine (5 - 2023-24 season) 07/27/2022   DEXA scan (bone density measurement)  08/25/2022   Complete foot exam   12/29/2022   Mammogram  02/08/2023   Yearly kidney health urinalysis for diabetes  03/30/2023   Hemoglobin A1C  04/24/2023   Eye exam for diabetics  06/19/2023   Colon Cancer Screening  08/13/2023   Flu Shot  06/27/2023   Yearly kidney function blood test for diabetes  07/27/2023   Screening for Lung Cancer  03/31/2024   Medicare Annual Wellness Visit  06/19/2024   DTaP/Tdap/Td vaccine (3 - Td or Tdap) 12/05/2028   Pneumonia Vaccine  Completed   Zoster (Shingles) Vaccine  Completed   Hepatitis C Screening  Addressed   HPV Vaccine  Aged Out    Advanced directives: (Declined) Advance directive discussed with you today. Even though you declined this today, please call our office should you change your mind, and we can give you the proper paperwork for you to fill out.  Next Medicare Annual Wellness Visit scheduled for next year:   Preventive Care 72 Years and Older, Female Preventive care refers to lifestyle choices and visits with your health care provider that can promote health and wellness. What does preventive care include? A yearly physical exam. This is also called an annual well check. Dental exams once or twice a year. Routine eye exams. Ask your health care provider how often you should have  your eyes checked. Personal lifestyle choices, including: Daily care of your teeth and gums. Regular physical activity. Eating a healthy diet. Avoiding tobacco and drug use. Limiting alcohol use. Practicing safe sex. Taking low-dose aspirin every day. Taking vitamin and mineral supplements as recommended by your health care provider. What happens during an annual well check? The services and screenings done by your health care provider during your annual well check will depend on your age, overall health, lifestyle risk factors, and family history of disease. Counseling  Your health care provider may ask you questions about your: Alcohol use. Tobacco use. Drug use. Emotional well-being. Home and relationship well-being. Sexual activity. Eating habits. History of falls. Memory and ability to understand (cognition). Work and work Astronomer. Reproductive health. Screening  You may have the following tests or measurements: Height, weight, and BMI. Blood pressure. Lipid and cholesterol levels. These may be checked every 5 years, or more frequently if you are over 36 years old. Skin check. Lung cancer screening. You may have this screening every year starting at age 72 if you have a 30-pack-year history of smoking and currently smoke or have quit within the past 15 years. Fecal occult blood test (FOBT) of the stool. You may have this test every year starting at age 65. Flexible sigmoidoscopy or colonoscopy. You may have a sigmoidoscopy every 5 years or a colonoscopy every 10 years starting at age 30. Hepatitis C blood test. Hepatitis B blood test. Sexually transmitted disease (STD) testing. Diabetes  screening. This is done by checking your blood sugar (glucose) after you have not eaten for a while (fasting). You may have this done every 1-3 years. Bone density scan. This is done to screen for osteoporosis. You may have this done starting at age 72. Mammogram. This may be done every  1-2 years. Talk to your health care provider about how often you should have regular mammograms. Talk with your health care provider about your test results, treatment options, and if necessary, the need for more tests. Vaccines  Your health care provider may recommend certain vaccines, such as: Influenza vaccine. This is recommended every year. Tetanus, diphtheria, and acellular pertussis (Tdap, Td) vaccine. You may need a Td booster every 10 years. Zoster vaccine. You may need this after age 76. Pneumococcal 13-valent conjugate (PCV13) vaccine. One dose is recommended after age 72. Pneumococcal polysaccharide (PPSV23) vaccine. One dose is recommended after age 72. Talk to your health care provider about which screenings and vaccines you need and how often you need them. This information is not intended to replace advice given to you by your health care provider. Make sure you discuss any questions you have with your health care provider. Document Released: 12/09/2015 Document Revised: 08/01/2016 Document Reviewed: 09/13/2015 Elsevier Interactive Patient Education  2017 [employer].  Fall Prevention in the Home Falls can cause injuries. They can happen to people of all ages. There are many things you can do to make your home safe and to help prevent falls. What can I do on the outside of my home? Regularly fix the edges of walkways and driveways and fix any cracks. Remove anything that might make you trip as you walk through a door, such as a raised step or threshold. Trim any bushes or trees on the path to your home. Use bright outdoor lighting. Clear any walking paths of anything that might make someone trip, such as rocks or tools. Regularly check to see if handrails are loose or broken. Make sure that both sides of any steps have handrails. Any raised decks and porches should have guardrails on the edges. Have any leaves, snow, or ice cleared regularly. Use sand or salt on walking  paths during winter. Clean up any spills in your garage right away. This includes oil or grease spills. What can I do in the bathroom? Use night lights. Install grab bars by the toilet and in the tub and shower. Do not use towel bars as grab bars. Use non-skid mats or decals in the tub or shower. If you need to sit down in the shower, use a plastic, non-slip stool. Keep the floor dry. Clean up any water that spills on the floor as soon as it happens. Remove soap buildup in the tub or shower regularly. Attach bath mats securely with double-sided non-slip rug tape. Do not have throw rugs and other things on the floor that can make you trip. What can I do in the bedroom? Use night lights. Make sure that you have a light by your bed that is easy to reach. Do not use any sheets or blankets that are too big for your bed. They should not hang down onto the floor. Have a firm chair that has side arms. You can use this for support while you get dressed. Do not have throw rugs and other things on the floor that can make you trip. What can I do in the kitchen? Clean up any spills right away. Avoid walking on wet floors. Keep  items that you use a lot in easy-to-reach places. If you need to reach something above you, use a strong step stool that has a grab bar. Keep electrical cords out of the way. Do not use floor polish or wax that makes floors slippery. If you must use wax, use non-skid floor wax. Do not have throw rugs and other things on the floor that can make you trip. What can I do with my stairs? Do not leave any items on the stairs. Make sure that there are handrails on both sides of the stairs and use them. Fix handrails that are broken or loose. Make sure that handrails are as long as the stairways. Check any carpeting to make sure that it is firmly attached to the stairs. Fix any carpet that is loose or worn. Avoid having throw rugs at the top or bottom of the stairs. If you do have  throw rugs, attach them to the floor with carpet tape. Make sure that you have a light switch at the top of the stairs and the bottom of the stairs. If you do not have them, ask someone to add them for you. What else can I do to help prevent falls? Wear shoes that: Do not have high heels. Have rubber bottoms. Are comfortable and fit you well. Are closed at the toe. Do not wear sandals. If you use a stepladder: Make sure that it is fully opened. Do not climb a closed stepladder. Make sure that both sides of the stepladder are locked into place. Ask someone to hold it for you, if possible. Clearly mark and make sure that you can see: Any grab bars or handrails. First and last steps. Where the edge of each step is. Use tools that help you move around (mobility aids) if they are needed. These include: Canes. Walkers. Scooters. Crutches. Turn on the lights when you go into a dark area. Replace any light bulbs as soon as they burn out. Set up your furniture so you have a clear path. Avoid moving your furniture around. If any of your floors are uneven, fix them. If there are any pets around you, be aware of where they are. Review your medicines with your doctor. Some medicines can make you feel dizzy. This can increase your chance of falling. Ask your doctor what other things that you can do to help prevent falls. This information is not intended to replace advice given to you by your health care provider. Make sure you discuss any questions you have with your health care provider. Document Released: 09/08/2009 Document Revised: 04/19/2016 Document Reviewed: 12/17/2014 Elsevier Interactive Patient Education  2017 [employer].

## 2023-06-24 ENCOUNTER — Other Ambulatory Visit: Payer: Self-pay | Admitting: Family Medicine

## 2023-06-28 ENCOUNTER — Other Ambulatory Visit: Payer: Self-pay | Admitting: Family Medicine

## 2023-06-28 DIAGNOSIS — E2839 Other primary ovarian failure: Secondary | ICD-10-CM

## 2023-07-03 ENCOUNTER — Ambulatory Visit: Payer: Medicare Other | Admitting: Family Medicine

## 2023-07-10 ENCOUNTER — Ambulatory Visit
Admission: RE | Admit: 2023-07-10 | Discharge: 2023-07-10 | Disposition: A | Discharge status: Home / Self Care | Payer: Medicare Other | Source: Ambulatory Visit | Attending: Family Medicine | Admitting: Family Medicine

## 2023-07-10 DIAGNOSIS — Z1231 Encounter for screening mammogram for malignant neoplasm of breast: Secondary | ICD-10-CM | POA: Diagnosis not present

## 2023-07-11 ENCOUNTER — Ambulatory Visit: Payer: Medicare Other

## 2023-07-11 DIAGNOSIS — H524 Presbyopia: Secondary | ICD-10-CM | POA: Diagnosis not present

## 2023-07-11 DIAGNOSIS — E119 Type 2 diabetes mellitus without complications: Secondary | ICD-10-CM | POA: Diagnosis not present

## 2023-07-11 LAB — HM DIABETES EYE EXAM

## 2023-07-12 ENCOUNTER — Ambulatory Visit (INDEPENDENT_AMBULATORY_CARE_PROVIDER_SITE_OTHER): Payer: Medicare Other | Admitting: Family Medicine

## 2023-07-12 ENCOUNTER — Encounter: Payer: Self-pay | Admitting: Family Medicine

## 2023-07-12 VITALS — BP 118/62 | HR 64 | Temp 98.2°F | Ht 67.0 in | Wt 192.6 lb

## 2023-07-12 DIAGNOSIS — I152 Hypertension secondary to endocrine disorders: Secondary | ICD-10-CM

## 2023-07-12 DIAGNOSIS — F3341 Major depressive disorder, recurrent, in partial remission: Secondary | ICD-10-CM

## 2023-07-12 DIAGNOSIS — Z780 Asymptomatic menopausal state: Secondary | ICD-10-CM

## 2023-07-12 DIAGNOSIS — E1159 Type 2 diabetes mellitus with other circulatory complications: Secondary | ICD-10-CM | POA: Diagnosis not present

## 2023-07-12 DIAGNOSIS — M8580 Other specified disorders of bone density and structure, unspecified site: Secondary | ICD-10-CM | POA: Diagnosis not present

## 2023-07-12 DIAGNOSIS — E1169 Type 2 diabetes mellitus with other specified complication: Secondary | ICD-10-CM | POA: Diagnosis not present

## 2023-07-12 DIAGNOSIS — N390 Urinary tract infection, site not specified: Secondary | ICD-10-CM

## 2023-07-12 DIAGNOSIS — N1831 Chronic kidney disease, stage 3a: Secondary | ICD-10-CM | POA: Diagnosis not present

## 2023-07-12 DIAGNOSIS — E1121 Type 2 diabetes mellitus with diabetic nephropathy: Secondary | ICD-10-CM

## 2023-07-12 DIAGNOSIS — E871 Hypo-osmolality and hyponatremia: Secondary | ICD-10-CM | POA: Diagnosis not present

## 2023-07-12 DIAGNOSIS — E785 Hyperlipidemia, unspecified: Secondary | ICD-10-CM

## 2023-07-12 DIAGNOSIS — J410 Simple chronic bronchitis: Secondary | ICD-10-CM | POA: Diagnosis not present

## 2023-07-12 LAB — LIPID PANEL
Cholesterol: 261 mg/dL — ABNORMAL HIGH (ref 0–200)
HDL: 37.2 mg/dL — ABNORMAL LOW (ref >39.00)
NonHDL: 224.23
Total CHOL/HDL Ratio: 7
Triglycerides: 360 mg/dL — ABNORMAL HIGH (ref 0.0–149.0)
VLDL: 72 mg/dL — ABNORMAL HIGH (ref 0.0–40.0)

## 2023-07-12 LAB — LDL CHOLESTEROL, DIRECT: Direct LDL: 202 mg/dL

## 2023-07-12 LAB — CBC WITH DIFFERENTIAL/PLATELET
Basophils Absolute: 0.1 10*3/uL (ref 0.0–0.1)
Basophils Relative: 1 % (ref 0.0–3.0)
Eosinophils Absolute: 0.2 10*3/uL (ref 0.0–0.7)
Eosinophils Relative: 4.2 % (ref 0.0–5.0)
HCT: 41.8 % (ref 36.0–46.0)
Hemoglobin: 13.9 g/dL (ref 12.0–15.0)
Lymphocytes Relative: 21.2 % (ref 12.0–46.0)
Lymphs Abs: 1.2 10*3/uL (ref 0.7–4.0)
MCHC: 33.2 g/dL (ref 30.0–36.0)
MCV: 93.4 fl (ref 78.0–100.0)
Monocytes Absolute: 0.4 10*3/uL (ref 0.1–1.0)
Monocytes Relative: 7 % (ref 3.0–12.0)
Neutro Abs: 3.9 10*3/uL (ref 1.4–7.7)
Neutrophils Relative %: 66.6 % (ref 43.0–77.0)
Platelets: 334 10*3/uL (ref 150.0–400.0)
RBC: 4.48 Mil/uL (ref 3.87–5.11)
RDW: 14.9 % (ref 11.5–15.5)
WBC: 5.8 10*3/uL (ref 4.0–10.5)

## 2023-07-12 LAB — COMPREHENSIVE METABOLIC PANEL
ALT: 15 U/L (ref 0–35)
AST: 16 U/L (ref 0–37)
Albumin: 4.1 g/dL (ref 3.5–5.2)
Alkaline Phosphatase: 130 U/L — ABNORMAL HIGH (ref 39–117)
BUN: 15 mg/dL (ref 6–23)
CO2: 23 mEq/L (ref 19–32)
Calcium: 10.2 mg/dL (ref 8.4–10.5)
Chloride: 98 meq/L (ref 96–112)
Creatinine, Ser: 1.36 mg/dL — ABNORMAL HIGH (ref 0.40–1.20)
GFR: 39.05 mL/min — ABNORMAL LOW (ref >60.00)
Glucose, Bld: 168 mg/dL — ABNORMAL HIGH (ref 70–99)
Potassium: 4.1 meq/L (ref 3.5–5.1)
Sodium: 129 mEq/L — ABNORMAL LOW (ref 135–145)
Total Bilirubin: 0.3 mg/dL (ref 0.2–1.2)
Total Protein: 6.9 g/dL (ref 6.0–8.3)

## 2023-07-12 LAB — MICROALBUMIN / CREATININE URINE RATIO
Creatinine,U: 101.9 mg/dL
Microalb Creat Ratio: 35.4 mg/g — ABNORMAL HIGH (ref 0.0–30.0)
Microalb, Ur: 36.1 mg/dL — ABNORMAL HIGH (ref 0.0–1.9)

## 2023-07-12 LAB — TSH: TSH: 1.77 u[IU]/mL (ref 0.35–5.50)

## 2023-07-12 MED ORDER — ATORVASTATIN CALCIUM 40 MG PO TABS
40.0000 mg | ORAL_TABLET | Freq: Every evening | ORAL | Status: DC
Start: 2023-07-12 — End: 2023-07-23

## 2023-07-12 MED ORDER — ALPRAZOLAM 0.5 MG PO TABS: ORAL_TABLET | ORAL | 0 refills | Status: DC

## 2023-07-12 MED ORDER — VENLAFAXINE HCL ER 75 MG PO CP24: ORAL_CAPSULE | ORAL | 3 refills | Status: DC

## 2023-07-12 NOTE — Patient Instructions (Signed)
Please return in 3 months to recheck diabetes and blood pressure  If you have any questions or concerns, please don't hesitate to send me a message via MyChart or call the office at [phone]. Thank you for visiting with [place] today! It's our pleasure caring for you.

## 2023-07-12 NOTE — Progress Notes (Signed)
Subjective  CC:  Chief Complaint  Patient presents with   Discuss referral    Pt wanted to discuss referral for DEXA and would like it to go to [facility] on [address]    HPI: Jessica Curry is a 72 y.o. female who presents to the office today for follow up of diabetes and problems listed above in the chief complaint.  Pt was last here in January. Overdue for chronic problem f/u. HM: had lung cancer screen in may. Needs dexa. Mammo and eye exam now current. No retinopathy.   Diabetes follow up: Her diabetic control is reported as  unknown . Feels fine.  She denies exertional CP or SOB or symptomatic hypoglycemia. She denies foot sores or paresthesias.  Due for HLD f/u. Fasting today for recheck. Needs statin refilled. Crestor 40.  HTN is well controlled. No cp Grief/depression: remains active. Having a hard time adjusting to life without her mother.  Copd: cough and possible exposure to black mold.  Recurrent uti now well controlled on septra daily prophylaxis.  Ckd due for recheck. No leg edema Chronic hypnatremia needs recheck. No cognitive concerns.   Wt Readings from Last 3 Encounters:  07/12/23 192 lb 9.6 oz (87.4 kg)  06/20/23 187 lb (84.8 kg)  12/20/22 187 lb 3.2 oz (84.9 kg)    BP Readings from Last 3 Encounters:  07/12/23 118/62  12/20/22 108/68  10/24/22 136/76    Assessment  1. Type 2 diabetes mellitus with nephropathy (HCC)   2. Hyperlipidemia associated with type 2 diabetes mellitus (HCC)   3. Hypertension associated with diabetes (HCC)   4. Asymptomatic menopausal state   5. Osteopenia, unspecified location   6. Depression, major, recurrent, in partial remission (HCC)   7. Simple chronic bronchitis (HCC)   8. Chronic kidney disease, stage 3a (HCC)   9. Recurrent UTI   10. Hyponatremia      Plan  Diabetes will recheck labs. Urine. And get eye exam report that was done yesterday pt reports no retinopathy. Bp is controlled. Check renal function and  electrolytes. HLD check fasting lipids. Needs refills; hopefully now at goal Depression: on meds. Grieving.  Copd: ventolin. Will consider adding breztri next visit   Continue septra ds daily for uti prophylaxis Recheck sodium and kidney function  Follow up: 3 mo for recheck Orders Placed This Encounter  Procedures   DG Bone Density   CBC with Differential/Platelet   Comprehensive metabolic panel   Lipid panel   Hemoglobin A1c   TSH   Microalbumin / creatinine urine ratio   Meds ordered this encounter  Medications   ALPRAZolam (XANAX) 0.5 MG tablet    Sig: TAKE ONE TABLET BY MOUTH DAILY AS NEEDED FOR ANXIETY    Dispense:  30 tablet    Refill:  0   atorvastatin (LIPITOR) 40 MG tablet    Sig: Take 1 tablet (40 mg total) by mouth at bedtime.   venlafaxine XR (EFFEXOR-XR) 75 MG 24 hr capsule    Sig: TAKE 3 CAPSULES BY MOUTH  DAILY WITH BREAKFAST    Dispense:  270 capsule    Refill:  3    Requesting 1 year supply      Immunization History  Administered Date(s) Administered   Fluad Quad(high Dose 65+) 08/05/2019, 08/18/2020, 08/09/2021, 10/24/2022   Influenza, High Dose Seasonal PF 08/20/2017, 08/06/2018   Influenza-Unspecified 09/24/2016   PFIZER Comirnaty(Gray Top)Covid-19 Tri-Sucrose Vaccine 04/10/2021   PFIZER(Purple Top)SARS-COV-2 Vaccination 12/15/2019, 01/05/2020   Pfizer Covid-19 Vaccine Bivalent  Booster 95yrs & up 09/04/2021   Pneumococcal Conjugate-13 09/10/2016   Pneumococcal Polysaccharide-23 12/02/2017   Tdap 08/20/2018, 12/05/2018   Zoster Recombinant(Shingrix) 06/05/2017, 09/18/2017    Diabetes Related Lab Review: Lab Results  Component Value Date   HGBA1C 5.9 (A) 10/24/2022   HGBA1C 5.7 (A) 07/16/2022   HGBA1C 5.7 (A) 03/29/2022    Lab Results  Component Value Date   MICROALBUR 141.4 (H) 03/29/2022   Lab Results  Component Value Date   CREATININE 1.57 (H) 07/26/2022   BUN 25 (H) 07/26/2022   NA 132 (L) 07/26/2022   K 5.0 07/26/2022   CL 101  07/26/2022   CO2 23 07/26/2022   Lab Results  Component Value Date   CHOL 180 12/29/2021   CHOL 158 12/26/2020   CHOL 144 09/15/2020   Lab Results  Component Value Date   HDL 47.60 12/29/2021   HDL 43.10 12/26/2020   HDL 39 (L) 09/15/2020   Lab Results  Component Value Date   LDLCALC 78 09/15/2020   Lab Results  Component Value Date   TRIG 299.0 (H) 12/29/2021   TRIG 226.0 (H) 12/26/2020   TRIG 174 (H) 09/15/2020   Lab Results  Component Value Date   CHOLHDL 4 12/29/2021   CHOLHDL 4 12/26/2020   CHOLHDL 3.7 09/15/2020   Lab Results  Component Value Date   LDLDIRECT 94.0 12/29/2021   LDLDIRECT 96.0 12/26/2020   LDLDIRECT 96.0 09/17/2019   The 10-year ASCVD risk score (Arnett DK, et al., 2019) is: 35.1%   Values used to calculate the score:     Age: 71 years     Sex: Female     Is Non-Hispanic African American: No     Diabetic: Yes     Tobacco smoker: Yes     Systolic Blood Pressure: 118 mmHg     Is BP treated: Yes     HDL Cholesterol: 47.6 mg/dL     Total Cholesterol: 180 mg/dL I have reviewed the PMH, Fam and Soc history. Patient Active Problem List   Diagnosis Date Noted Date Diagnosed   Chronic kidney disease, stage 3a (HCC) 02/07/2021     Priority: High    Renal ultrasound: mild increased echogenecity c/w medicorenal disease. Renal cysts w/ benign appearing f/u MRI Started farxiga 06/2022    History of heart artery stent 02/10/2020     Priority: High   Simple chronic bronchitis (HCC) 02/11/2018     Priority: High    SPIRO NML 01/2018     Hyperlipidemia associated with type 2 diabetes mellitus (HCC) 06/04/2017     Priority: High    Atorvastatin 20mg  MWF    Type 2 diabetes mellitus with nephropathy (HCC) 09/10/2016     Priority: High   Hypertension associated with diabetes (HCC) 09/10/2016     Priority: High   Nicotine dependence, cigarettes, w unsp disorders 09/10/2016     Priority: High    lung cancer screening- 3/4 PPD- since age 34 prior 2  PPD. 1 PPD. Cut down since heart attack.     Coronary artery disease of native artery of native heart with stable angina pectoris [facility])      Priority: High    [place].  Statin.  Dr. Anne Fu now- previouslyNo cardiology since 2012 due to insurance issues.  Dr. Dot Been stent with [facility] in [place]. Cobalt chromium stent 06/05/2011- toPCI/bare metal to mid circumflex. Has not seen him since.     Depression, major, recurrent, in partial remission (HCC)  Priority: High    reasonable control on venlafaxine 75mg  TID. xanax #14 lasts about 2 years for anxiety portion.     Primary osteoarthritis of both knees 10/24/2022     Priority: Medium    Cholelithiasis 10/04/2021     Priority: Medium     RUQ ultrasound: biliary sludge and stones    Claudication (HCC) 08/28/2021     Priority: Medium    History of sepsis 05/11/2021     Priority: Medium     2022; unclear source    Chronic hyponatremia 02/07/2021     Priority: Medium    Recurrent UTI 02/06/2021     Priority: Medium     Septra prophylaxis started march 2023    Bilateral renal cysts 02/06/2021     Priority: Medium     Renal ultrasound 4/20200 bilateral cysts; rec repeat in 6 months to recheck.  F/u MRI 01/2022: benign exophytic renal cyst. 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    Serrated polyp of colon 08/12/2018     Priority: Medium     Sessile serrated polyp 2019    Osteopenia 06/10/2017     Priority: Medium     Dexa: 07/2020 stable osteopenia, 06/10/17: osteopenia     Encounter for screening for lung cancer 02/10/2020     Priority: Low    Annual by chest CT; h/o benign appearing nodules.     Mild intermittent asthma 08/06/2019     Priority: Low   Essential tremor  10/24/2022     Social History: Patient  reports that she has been smoking cigarettes. She has a 24 pack-year smoking history. She has never used smokeless tobacco. She reports current alcohol use of about 4.0 standard drinks of alcohol per week. She reports that she does not use drugs.  Review of Systems: Ophthalmic: negative for eye pain, loss of vision or double vision Cardiovascular: negative for chest pain Respiratory: negative for SOB or persistent cough Gastrointestinal: negative for abdominal pain Genitourinary: negative for dysuria or gross hematuria MSK: negative for foot lesions Neurologic: negative for weakness or gait disturbance  Objective  Vitals: BP 118/62   Pulse 64   Temp 98.2 F (36.8 C)   Ht 5\' 7"  (1.702 m)   Wt 192 lb 9.6 oz (87.4 kg)   LMP  (LMP Unknown)   SpO2 94%   BMI 30.17 kg/m  General: well appearing, no acute distress  Psych:  Alert and oriented, normal mood and affect HEENT:  Normocephalic, atraumatic, moist mucous membranes, supple neck  Cardiovascular:  Nl S1 and S2, RRR without murmur, gallop or rub. no edema Respiratory:  Good breath sounds bilaterally, CTAB with normal effort, no rales   Diabetic education: ongoing education regarding chronic disease management for diabetes was given today. We continue to reinforce the ABC's of diabetic management: A1c (<7 or 8 dependent upon patient), tight blood pressure control, and cholesterol management with goal LDL < 100 minimally. We discuss diet strategies, exercise recommendations, medication options and possible side effects. At each visit, we review recommended immunizations and preventive care recommendations for diabetics and stress that good diabetic control can prevent other problems. See below for this patient's data.   Commons side  effects, risks, benefits, and alternatives for medications and treatment plan prescribed today were discussed, and the patient expressed understanding of the given  instructions. Patient is instructed to call or message via MyChart if he/she has any questions or concerns regarding our treatment plan. No barriers to understanding were identified. We discussed Red Flag symptoms and signs in detail. Patient expressed understanding regarding what to do in case of urgent or emergency type symptoms.  Medication list was reconciled, printed and provided to the patient in AVS. Patient instructions and summary information was reviewed with the patient as documented in the AVS. This note was prepared with assistance of Dragon voice recognition software. Occasional wrong-word or sound-a-like substitutions may have occurred due to the inherent limitations of voice recognition software

## 2023-07-15 LAB — HEMOGLOBIN A1C: Hgb A1c MFr Bld: 7.3 % — ABNORMAL HIGH (ref 4.6–6.5)

## 2023-07-16 ENCOUNTER — Encounter: Payer: Self-pay | Admitting: Pharmacist

## 2023-07-16 NOTE — Progress Notes (Signed)
Pharmacy Quality Measure Review  This patient is appearing on a report for being at risk of failing the adherence measure for cholesterol (statin) and diabetes medications this calendar year.   Medication: atorvastatin 20mg  Last fill date: 01/11/2023 for 90 day supply Patient was recently seen by PCP and noted to have LDL of 202 and Tg of 360. Suspect related to low adherence with statin. Patient has only filled statin once in 2024 so far.  New prescription was sent to pharmacy 07/12/2023 for atorvastatin 40mg  daily. Has not been picked up yet   Medication: Farxiga/dapagliflozin  Last fill date: 07/11/2023 for 30 DS [facility] and verified refill but has not been picked up yet. Copay was $47 so she as not reached coverage gap yet.   Attempted outreach to patient but was not able to speak with her. Reminded her that prescriptions were ready for pick up and provided my phone number in case has had any questions. [phone].   Henrene Pastor, PharmD Clinical Pharmacist [facility]  [facility]

## 2023-07-18 DIAGNOSIS — D225 Melanocytic nevi of trunk: Secondary | ICD-10-CM | POA: Diagnosis not present

## 2023-07-18 DIAGNOSIS — D2262 Melanocytic nevi of left upper limb, including shoulder: Secondary | ICD-10-CM | POA: Diagnosis not present

## 2023-07-18 DIAGNOSIS — L821 Other seborrheic keratosis: Secondary | ICD-10-CM | POA: Diagnosis not present

## 2023-07-18 DIAGNOSIS — L814 Other melanin hyperpigmentation: Secondary | ICD-10-CM | POA: Diagnosis not present

## 2023-07-18 DIAGNOSIS — D235 Other benign neoplasm of skin of trunk: Secondary | ICD-10-CM | POA: Diagnosis not present

## 2023-07-18 DIAGNOSIS — D1801 Hemangioma of skin and subcutaneous tissue: Secondary | ICD-10-CM | POA: Diagnosis not present

## 2023-07-18 DIAGNOSIS — L820 Inflamed seborrheic keratosis: Secondary | ICD-10-CM | POA: Diagnosis not present

## 2023-07-23 ENCOUNTER — Other Ambulatory Visit: Payer: Self-pay | Admitting: Pharmacist

## 2023-07-23 ENCOUNTER — Other Ambulatory Visit: Payer: Medicare Other | Admitting: Pharmacist

## 2023-07-23 DIAGNOSIS — E1169 Type 2 diabetes mellitus with other specified complication: Secondary | ICD-10-CM

## 2023-07-23 NOTE — Telephone Encounter (Signed)
Pharmacy Quality Measure Review - Planned follow up call to patient today to follow up adherence for atorvastatin and Farxiga  This patient is appearing on a report for being at risk of failing the adherence measure for cholesterol (statin) and diabetes medications this calendar year.   Medication: Farxiga 10mg  daily  Last fill date: 07/11/2023 for 30 day supply but per [facility] prescription has not been picked up yet.  Medication: atorvastatin 40mg  Last fill date: 01/11/2023 for 90 day supply.It looks like Rx was entered 07/12/2023 for atorvastatin but Rx record shows "no print" so was not sent to pharmacy. [facility] and they state that they need updated atorvastatin Rx.   Will reach out to PCP to request updated Rx for atorvastatin to be sent to pharmacy.  Tried to reach patient to discuss adherence and remind about [place] prescription (per [place] [facility] is $47)  LM on VM with CB# [phone] or [phone]

## 2023-07-24 MED ORDER — ATORVASTATIN CALCIUM 40 MG PO TABS
40.0000 mg | ORAL_TABLET | Freq: Every evening | ORAL | 3 refills | Status: AC
Start: 2023-07-24 — End: ?

## 2023-07-25 NOTE — Progress Notes (Signed)
Please call patient: Please have patient's schedule another office visit to go over these results.  Need to adjust medications, diabetes has worsened, cholesterol has worsened at [facility].

## 2023-08-11 ENCOUNTER — Other Ambulatory Visit: Payer: Self-pay | Admitting: Family Medicine

## 2023-08-11 DIAGNOSIS — E1121 Type 2 diabetes mellitus with diabetic nephropathy: Secondary | ICD-10-CM

## 2023-08-12 ENCOUNTER — Ambulatory Visit (INDEPENDENT_AMBULATORY_CARE_PROVIDER_SITE_OTHER): Payer: Medicare Other | Admitting: Family Medicine

## 2023-08-12 ENCOUNTER — Encounter: Payer: Self-pay | Admitting: Family Medicine

## 2023-08-12 VITALS — BP 128/70 | HR 60 | Temp 97.6°F | Ht 67.0 in | Wt 190.2 lb

## 2023-08-12 DIAGNOSIS — I25118 Atherosclerotic heart disease of native coronary artery with other forms of angina pectoris: Secondary | ICD-10-CM

## 2023-08-12 DIAGNOSIS — E1169 Type 2 diabetes mellitus with other specified complication: Secondary | ICD-10-CM | POA: Diagnosis not present

## 2023-08-12 DIAGNOSIS — Z7984 Long term (current) use of oral hypoglycemic drugs: Secondary | ICD-10-CM | POA: Diagnosis not present

## 2023-08-12 DIAGNOSIS — E785 Hyperlipidemia, unspecified: Secondary | ICD-10-CM

## 2023-08-12 DIAGNOSIS — E1121 Type 2 diabetes mellitus with diabetic nephropathy: Secondary | ICD-10-CM | POA: Diagnosis not present

## 2023-08-12 DIAGNOSIS — E1159 Type 2 diabetes mellitus with other circulatory complications: Secondary | ICD-10-CM | POA: Diagnosis not present

## 2023-08-12 DIAGNOSIS — E871 Hypo-osmolality and hyponatremia: Secondary | ICD-10-CM | POA: Diagnosis not present

## 2023-08-12 DIAGNOSIS — Z23 Encounter for immunization: Secondary | ICD-10-CM

## 2023-08-12 DIAGNOSIS — I152 Hypertension secondary to endocrine disorders: Secondary | ICD-10-CM

## 2023-08-12 DIAGNOSIS — N1831 Chronic kidney disease, stage 3a: Secondary | ICD-10-CM

## 2023-08-12 MED ORDER — METFORMIN HCL 500 MG PO TABS: 500.0000 mg | ORAL_TABLET | Freq: Two times a day (BID) | ORAL | 3 refills | Status: DC

## 2023-08-12 MED ORDER — DAPAGLIFLOZIN PROPANEDIOL 10 MG PO TABS
10.0000 mg | ORAL_TABLET | Freq: Every day | ORAL | 3 refills | Status: DC
Start: 2023-08-12 — End: 2024-08-12

## 2023-08-12 NOTE — Progress Notes (Signed)
Subjective  CC:  Chief Complaint  Patient presents with   lab results    Colonoscopy has not been done yet     HPI: Jessica Curry is a 72 y.o. female who presents to the office today for follow up of diabetes and problems listed above in the chief complaint.  Diabetes follow up: Her diabetic control is reported as Worse. On farxiga 10 but eating more sweets: has "rediscovered" chocolate cake. Sedentary.  She denies exertional CP or SOB or symptomatic hypoglycemia. She denies foot sores or paresthesias.  Bp is controlled. Reviewed lipids: ldl 202!! Now reports she had been out of crestor 40 for > 3 months. Has since restarted. Tolerates well. Diet is worse.  Chronic hyponatremia; no mental status changes.   Wt Readings from Last 3 Encounters:  08/12/23 190 lb 3.2 oz (86.3 kg)  07/12/23 192 lb 9.6 oz (87.4 kg)  06/20/23 187 lb (84.8 kg)    BP Readings from Last 3 Encounters:  08/12/23 128/70  07/12/23 118/62  12/20/22 108/68    Assessment  1. Type 2 diabetes mellitus with nephropathy (HCC)   2. Need for influenza vaccination   3. Hypertension associated with diabetes (HCC)   4. Hyperlipidemia associated with type 2 diabetes mellitus (HCC)   5. Chronic hyponatremia   6. Chronic kidney disease, stage 3a (HCC)   7. Coronary artery disease of native artery of native heart with stable angina pectoris (HCC) Chronic     Plan  Diabetes is currently marginally controlled. Add back met 500 bid and continue farxiga 10. Recheck in 3 months. Dietary recommendations given. Flu shot updated today BP is good HLD: restarted crestor 40 and will recheck lipids in 3 months. Uncontrolled due to running out of medications. Monitor sodium, recheck next visit.  No angina. On asa.   Follow up: 3 mo for dm recheck Orders Placed This Encounter  Procedures   Flu Vaccine Trivalent High Dose (Fluad)   Meds ordered this encounter  Medications   metFORMIN (GLUCOPHAGE) 500 MG tablet    Sig:  Take 1 tablet (500 mg total) by mouth 2 (two) times daily with a meal.    Dispense:  180 tablet    Refill:  3   dapagliflozin propanediol (FARXIGA) 10 MG TABS tablet    Sig: Take 1 tablet (10 mg total) by mouth daily before breakfast.    Dispense:  90 tablet    Refill:  3      Immunization History  Administered Date(s) Administered   Fluad Quad(high Dose 65+) 08/05/2019, 08/18/2020, 08/09/2021, 10/24/2022   Fluad Trivalent(High Dose 65+) 08/12/2023   Influenza, High Dose Seasonal PF 08/20/2017, 08/06/2018   Influenza-Unspecified 09/24/2016   PFIZER Comirnaty(Gray Top)Covid-19 Tri-Sucrose Vaccine 04/10/2021   PFIZER(Purple Top)SARS-COV-2 Vaccination 12/15/2019, 01/05/2020   Pfizer Covid-19 Vaccine Bivalent Booster 53yrs & up 09/04/2021   Pneumococcal Conjugate-13 09/10/2016   Pneumococcal Polysaccharide-23 12/02/2017   Tdap 08/20/2018, 12/05/2018   Zoster Recombinant(Shingrix) 06/05/2017, 09/18/2017    Diabetes Related Lab Review: Lab Results  Component Value Date   HGBA1C 7.3 (H) 07/12/2023   HGBA1C 5.9 (A) 10/24/2022   HGBA1C 5.7 (A) 07/16/2022    Lab Results  Component Value Date   MICROALBUR 36.1 (H) 07/12/2023   Lab Results  Component Value Date   CREATININE 1.36 (H) 07/12/2023   BUN 15 07/12/2023   NA 129 (L) 07/12/2023   K 4.1 07/12/2023   CL 98 07/12/2023   CO2 23 07/12/2023   Lab Results  Component  Value Date   CHOL 261 (H) 07/12/2023   CHOL 180 12/29/2021   CHOL 158 12/26/2020   Lab Results  Component Value Date   HDL 37.20 (L) 07/12/2023   HDL 47.60 12/29/2021   HDL 43.10 12/26/2020   Lab Results  Component Value Date   LDLCALC 78 09/15/2020   Lab Results  Component Value Date   TRIG 360.0 (H) 07/12/2023   TRIG 299.0 (H) 12/29/2021   TRIG 226.0 (H) 12/26/2020   Lab Results  Component Value Date   CHOLHDL 7 07/12/2023   CHOLHDL 4 12/29/2021   CHOLHDL 4 12/26/2020   Lab Results  Component Value Date   LDLDIRECT 202.0 07/12/2023    LDLDIRECT 94.0 12/29/2021   LDLDIRECT 96.0 12/26/2020   The 10-year ASCVD risk score (Arnett DK, et al., 2019) is: 43.4%   Values used to calculate the score:     Age: 80 years     Sex: Female     Is Non-Hispanic African American: No     Diabetic: Yes     Tobacco smoker: Yes     Systolic Blood Pressure: 128 mmHg     Is BP treated: Yes     HDL Cholesterol: 37.2 mg/dL     Total Cholesterol: 261 mg/dL I have reviewed the PMH, Fam and Soc history. Patient Active Problem List   Diagnosis Date Noted Date Diagnosed   Chronic kidney disease, stage 3a (HCC) 02/07/2021     Priority: High    Renal ultrasound: mild increased echogenecity c/w medicorenal disease. Renal cysts w/ benign appearing f/u MRI Started farxiga 06/2022    History of heart artery stent 02/10/2020     Priority: High   Simple chronic bronchitis (HCC) 02/11/2018     Priority: High    SPIRO NML 01/2018     Hyperlipidemia associated with type 2 diabetes mellitus (HCC) 06/04/2017     Priority: High   Type 2 diabetes mellitus with nephropathy (HCC) 09/10/2016     Priority: High   Hypertension associated with diabetes (HCC) 09/10/2016     Priority: High   Nicotine dependence, cigarettes, w unsp disorders 09/10/2016     Priority: High    lung cancer screening- 3/4 PPD- since age 44 prior 2 PPD. 1 PPD. Cut down since heart attack.     Coronary artery disease of native artery of native heart with stable angina pectoris [facility])      Priority: High    Asa.  Statin.  Dr. Anne Fu now- previouslyNo cardiology since 2012 due to insurance issues.  Dr. Dot Been stent with [facility] in [place]. Cobalt chromium stent 06/05/2011- toPCI/bare metal to mid circumflex. Has not seen him since.     Depression, major, recurrent, in partial remission (HCC)      Priority: High    reasonable control on venlafaxine 75mg  TID. xanax #14 lasts about 2 years for anxiety portion.     Primary osteoarthritis of both knees 10/24/2022     Priority:  Medium    Cholelithiasis 10/04/2021     Priority: Medium     RUQ ultrasound: biliary sludge and stones    Claudication (HCC) 08/28/2021     Priority: Medium    History of sepsis 05/11/2021     Priority: Medium     2022; unclear source    Chronic hyponatremia 02/07/2021     Priority: Medium    Recurrent UTI 02/06/2021     Priority: Medium     Septra prophylaxis started march 2023  Bilateral renal cysts 02/06/2021     Priority: Medium     Renal ultrasound 4/20200 bilateral cysts; rec repeat in 6 months to recheck.  F/u MRI 01/2022: benign exophytic renal cyst. 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    Serrated polyp of colon 08/12/2018     Priority: Medium     Sessile serrated polyp 2019    Osteopenia 06/10/2017     Priority: Medium     Dexa: 07/2020 stable osteopenia, 06/10/17: osteopenia     Encounter for screening for lung cancer 02/10/2020     Priority: Low    Annual by chest CT; h/o benign appearing nodules.     Mild intermittent asthma 08/06/2019     Priority: Low   Essential tremor 10/24/2022     Social History: Patient  reports that she has been smoking cigarettes. She has a 24 pack-year smoking history. She has never used smokeless tobacco. She reports current alcohol use of about 4.0 standard drinks of alcohol per week. She reports that she does not use drugs.  Review of Systems: Ophthalmic: negative for eye pain, loss of vision or double vision Cardiovascular: negative for chest pain Respiratory: negative for SOB or persistent cough Gastrointestinal: negative for abdominal pain Genitourinary: negative for dysuria or gross hematuria MSK: negative for foot lesions Neurologic: negative for weakness or gait  disturbance  Objective  Vitals: BP 128/70   Pulse 60   Temp 97.6 F (36.4 C)   Ht 5\' 7"  (1.702 m)   Wt 190 lb 3.2 oz (86.3 kg)   LMP  (LMP Unknown)   SpO2 95%   BMI 29.79 kg/m  General: well appearing, no acute distress  Psych:  Alert and oriented, normal mood and affect Foot exam: no erythema, pallor, or cyanosis visible nl proprioception and sensation to monofilament testing bilaterally, +2 distal pulses bilaterally  Diabetic education: ongoing education regarding chronic disease management for diabetes was given today. We continue to reinforce the ABC's of diabetic management: A1c (<7 or 8 dependent upon patient), tight blood pressure control, and cholesterol management with goal LDL < 100 minimally. We discuss diet strategies, exercise recommendations, medication options and possible side effects. At each visit, we review recommended immunizations and preventive care recommendations for diabetics and stress that good diabetic control can prevent other problems. See below for this patient's data.   Commons side effects, risks, benefits, and alternatives for medications and treatment plan prescribed today were discussed, and the patient expressed understanding of the given instructions. Patient is instructed to call or message via MyChart if he/she has any questions or concerns regarding our treatment plan. No barriers to understanding were identified. We discussed Red Flag symptoms and signs in detail. Patient expressed understanding regarding what to do in case of urgent or emergency type symptoms.  Medication list was reconciled, printed and provided to the patient in AVS. Patient instructions and summary information was reviewed with the patient as documented in the AVS. This note was prepared with assistance of Dragon voice recognition software. Occasional wrong-word or sound-a-like substitutions may have occurred due to the inherent limitations of voice recognition software

## 2023-08-15 ENCOUNTER — Other Ambulatory Visit: Payer: Self-pay

## 2023-08-15 ENCOUNTER — Telehealth: Payer: Self-pay | Admitting: Family Medicine

## 2023-08-15 MED ORDER — NITROGLYCERIN 0.4 MG SL SUBL: 0.4000 mg | SUBLINGUAL_TABLET | SUBLINGUAL | 3 refills | Status: DC | PRN

## 2023-08-15 MED ORDER — NITROGLYCERIN 0.4 MG SL SUBL: 0.4000 mg | SUBLINGUAL_TABLET | SUBLINGUAL | 3 refills | Status: AC | PRN

## 2023-08-15 NOTE — Telephone Encounter (Signed)
Patient called stating she didn't get the farxiga from the pharmacy and that she didn't know why. She also stated that needed a refill of the nitroglycerin that she was taking prior to establishing with Dr. Mardelle Matte.   I recommended patient call pharmacy and speak to live person to see why medication wasn't filled yet. Patient verbalized understanding and would still like a call from [place].

## 2023-08-15 NOTE — Telephone Encounter (Signed)
Spoke with pt and everything is ok now and also Rx has been sent to pharmacy

## 2023-10-01 ENCOUNTER — Encounter: Payer: Self-pay | Admitting: Gastroenterology

## 2023-10-07 ENCOUNTER — Telehealth: Payer: Self-pay | Admitting: Family Medicine

## 2023-10-07 NOTE — Telephone Encounter (Signed)
Pt states if we can send a substitute for  dapagliflozin propanediol (FARXIGA) 10 MG TABS tablet  Because the price is going to go up hundred of dollars. Please advise.

## 2023-10-10 ENCOUNTER — Other Ambulatory Visit: Payer: Self-pay

## 2023-10-10 DIAGNOSIS — M8580 Other specified disorders of bone density and structure, unspecified site: Secondary | ICD-10-CM

## 2023-10-10 NOTE — Telephone Encounter (Signed)
Farxiga samples will be placed up front

## 2023-11-05 ENCOUNTER — Encounter: Payer: Medicare Other | Admitting: Pharmacist

## 2023-11-11 ENCOUNTER — Ambulatory Visit: Payer: Medicare Other | Admitting: Family Medicine

## 2023-11-13 ENCOUNTER — Encounter: Payer: Self-pay | Admitting: Family Medicine

## 2023-11-13 ENCOUNTER — Ambulatory Visit: Payer: Medicare Other | Admitting: Family Medicine

## 2023-11-13 VITALS — BP 128/74 | HR 58 | Temp 97.9°F | Ht 67.0 in | Wt 188.2 lb

## 2023-11-13 DIAGNOSIS — E1169 Type 2 diabetes mellitus with other specified complication: Secondary | ICD-10-CM

## 2023-11-13 DIAGNOSIS — R400 Somnolence: Secondary | ICD-10-CM | POA: Diagnosis not present

## 2023-11-13 DIAGNOSIS — I152 Hypertension secondary to endocrine disorders: Secondary | ICD-10-CM

## 2023-11-13 DIAGNOSIS — E1121 Type 2 diabetes mellitus with diabetic nephropathy: Secondary | ICD-10-CM | POA: Diagnosis not present

## 2023-11-13 DIAGNOSIS — E1159 Type 2 diabetes mellitus with other circulatory complications: Secondary | ICD-10-CM

## 2023-11-13 DIAGNOSIS — E871 Hypo-osmolality and hyponatremia: Secondary | ICD-10-CM | POA: Diagnosis not present

## 2023-11-13 DIAGNOSIS — F3341 Major depressive disorder, recurrent, in partial remission: Secondary | ICD-10-CM

## 2023-11-13 DIAGNOSIS — D126 Benign neoplasm of colon, unspecified: Secondary | ICD-10-CM

## 2023-11-13 DIAGNOSIS — Z7984 Long term (current) use of oral hypoglycemic drugs: Secondary | ICD-10-CM | POA: Diagnosis not present

## 2023-11-13 DIAGNOSIS — E785 Hyperlipidemia, unspecified: Secondary | ICD-10-CM | POA: Diagnosis not present

## 2023-11-13 DIAGNOSIS — Z955 Presence of coronary angioplasty implant and graft: Secondary | ICD-10-CM

## 2023-11-13 DIAGNOSIS — E119 Type 2 diabetes mellitus without complications: Secondary | ICD-10-CM

## 2023-11-13 LAB — COMPREHENSIVE METABOLIC PANEL
ALT: 17 U/L (ref 0–35)
AST: 19 U/L (ref 0–37)
Albumin: 4.2 g/dL (ref 3.5–5.2)
Alkaline Phosphatase: 127 U/L — ABNORMAL HIGH (ref 39–117)
BUN: 18 mg/dL (ref 6–23)
CO2: 24 meq/L (ref 19–32)
Calcium: 10.7 mg/dL — ABNORMAL HIGH (ref 8.4–10.5)
Chloride: 101 meq/L (ref 96–112)
Creatinine, Ser: 1.31 mg/dL — ABNORMAL HIGH (ref 0.40–1.20)
GFR: 40.75 mL/min — ABNORMAL LOW (ref >60.00)
Glucose, Bld: 142 mg/dL — ABNORMAL HIGH (ref 70–99)
Potassium: 4.6 meq/L (ref 3.5–5.1)
Sodium: 133 meq/L — ABNORMAL LOW (ref 135–145)
Total Bilirubin: 0.3 mg/dL (ref 0.2–1.2)
Total Protein: 7.1 g/dL (ref 6.0–8.3)

## 2023-11-13 LAB — LIPID PANEL
Cholesterol: 156 mg/dL (ref 0–200)
HDL: 39.9 mg/dL (ref >39.00)
LDL Cholesterol: 82 mg/dL (ref 0–99)
NonHDL: 116.37
Total CHOL/HDL Ratio: 4
Triglycerides: 172 mg/dL — ABNORMAL HIGH (ref 0.0–149.0)
VLDL: 34.4 mg/dL (ref 0.0–40.0)

## 2023-11-13 LAB — POCT GLYCOSYLATED HEMOGLOBIN (HGB A1C): Hemoglobin A1C: 6.5 % — AB (ref 4.0–5.6)

## 2023-11-13 NOTE — Progress Notes (Signed)
Subjective  CC:  Chief Complaint  Patient presents with   Diabetes    Colonoscopy has not been done yet    HPI: Jessica Curry is a 72 y.o. female who presents to the office today for follow up of diabetes and problems listed above in the chief complaint.  Discussed the use of AI scribe software for clinical note transcription with the patient, who gave verbal consent to proceed.  History of Present Illness   The patient, with a history of depression, diabetes, and hyperlipidemia, presents with fatigue and excessive daytime sleepiness. She reports falling asleep easily during the day, even while sitting up, and describes feeling 'dragging.' She also notes a shift in her sleep schedule, going to bed later at around 1-2 am instead of her usual 11-12 pm. Despite this, she denies feeling particularly unwell and does not report any new symptoms.  The patient also discusses her ongoing management of diabetes and hyperlipidemia. She has been adherent to her metformin and atorvastatin, and has made dietary improvements, including eating more fruit. However, she expresses concern about the cost of her medications, particularly venlafaxine and Marcelline Deist, as she is transitioning from [employer] to [employer] in the coming year.  In addition, the patient mentions a persistent issue with allergies, for which she takes Benadryl at night. She also notes some physical limitations due to knee pain, which varies in severity from day to day.     HM: overdue for colonoscopy but has transportation concerns.  She is going to try to get her sister-in-law or niece to take her.  For now she needs to postpone it.  She does have history of a serrated polyp.  Last colonoscopy was 5 years ago, I reviewed the report.  Bone density scheduled.  Wt Readings from Last 3 Encounters:  11/13/23 188 lb 3.2 oz (85.4 kg)  08/12/23 190 lb 3.2 oz (86.3 kg)  07/12/23 192 lb 9.6 oz (87.4 kg)    BP Readings from Last 3  Encounters:  11/13/23 128/74  08/12/23 128/70  07/12/23 118/62    Assessment  1. Type 2 diabetes mellitus with nephropathy (HCC)   2. Diabetes mellitus treated with oral medication (HCC)   3. Hyperlipidemia associated with type 2 diabetes mellitus (HCC)   4. History of heart artery stent   5. Hypertension associated with diabetes (HCC)   6. Chronic hyponatremia   7. Sessile serrated polyp of colon   8. Depression, major, recurrent, in partial remission (HCC)      Plan  Assessment and Plan    Daytime Somnolence Reports falling asleep during the day while sitting up, indicating significant daytime somnolence. Difficulty sleeping at night, often not falling asleep until 1 or 2 AM. Suspected that allergies may be contributing to fatigue. - Recommend taking an allergy pill such as Allegra or Zyrtec. -Had a long conversation about grief, transitioning to next age of life, finding purpose and connection to others again.  Patient will work on it.  Allergic Rhinitis Reports constant nasal congestion and uses Benadryl at night. Suggested that allergies may be contributing to fatigue. Discussed that switching to a non-sedating allergy pill like Allegra or Zyrtec could help with daytime alertness and nighttime sleep. - Recommend taking an allergy pill such as Allegra or Zyrtec.  Diabetes Mellitus Type 2 Currently on Farxiga and metformin. Reports an improvement in diet, including increased fruit intake. Recent A1c levels have improved to 6.5. Discussed the importance of maintaining current medications and diet to  keep A1c levels stable. - Continue current diabetes medications (Farxiga and metformin). -Control is now improved.  Hyperlipidemia Restarted atorvastatin 40 mg. Previous LDL levels were over 200, significantly higher than the target of less than 100. Discussed the importance of medication adherence and dietary changes to manage cholesterol levels. - Check LDL levels to assess the  effectiveness of atorvastatin. -Recheck LFTs  Depression Has been on venlafaxine for a long time and is concerned about the cost of medications with the upcoming change in insurance. Advised to carefully consider the costs and benefits of changing medication regimen. Discussed the potential impact of changing mood medication and the importance of stability in treatment. - Evaluate the cost of venlafaxine under the new insurance plan. -Her mood is stable, chronic dysthymia  General Health Maintenance Due for a colonoscopy and a bone density scan. Needs to continue annual lung cancer screening due to smoking history. Discussed the importance of these screenings in early detection and management of potential health issues. - Reach out to [facility] gastroenterology to schedule a colonoscopy. - Call to schedule a bone density scan at [facility]. - Continue annual lung cancer screening CTs.  Follow-up - Go to the lab to check cholesterol levels. - Follow up in three months for follow-up    I spent a total of 44 minutes for this patient encounter. Time spent included preparation, face-to-face counseling with the patient and coordination of care, review of chart and records, and documentation of the encounter.  Orders Placed This Encounter  Procedures   Comprehensive metabolic panel   Lipid panel   POCT HgB A1C   No orders of the defined types were placed in this encounter.     Immunization History  Administered Date(s) Administered   Fluad Quad(high Dose 65+) 08/05/2019, 08/18/2020, 08/09/2021, 10/24/2022   Fluad Trivalent(High Dose 65+) 08/12/2023   Influenza, High Dose Seasonal PF 08/20/2017, 08/06/2018   Influenza-Unspecified 09/24/2016   PFIZER Comirnaty(Gray Top)Covid-19 Tri-Sucrose Vaccine 04/10/2021   PFIZER(Purple Top)SARS-COV-2 Vaccination 12/15/2019, 01/05/2020   Pfizer Covid-19 Vaccine Bivalent Booster 2yrs & up 09/04/2021   Pneumococcal Conjugate-13 09/10/2016   Pneumococcal  Polysaccharide-23 12/02/2017   Tdap 08/20/2018, 12/05/2018   Zoster Recombinant(Shingrix) 06/05/2017, 09/18/2017    Diabetes Related Lab Review: Lab Results  Component Value Date   HGBA1C 6.5 (A) 11/13/2023   HGBA1C 7.3 (H) 07/12/2023   HGBA1C 5.9 (A) 10/24/2022    Lab Results  Component Value Date   MICROALBUR 36.1 (H) 07/12/2023   Lab Results  Component Value Date   CREATININE 1.36 (H) 07/12/2023   BUN 15 07/12/2023   NA 129 (L) 07/12/2023   K 4.1 07/12/2023   CL 98 07/12/2023   CO2 23 07/12/2023   Lab Results  Component Value Date   CHOL 261 (H) 07/12/2023   CHOL 180 12/29/2021   CHOL 158 12/26/2020   Lab Results  Component Value Date   HDL 37.20 (L) 07/12/2023   HDL 47.60 12/29/2021   HDL 43.10 12/26/2020   Lab Results  Component Value Date   LDLCALC 78 09/15/2020   Lab Results  Component Value Date   TRIG 360.0 (H) 07/12/2023   TRIG 299.0 (H) 12/29/2021   TRIG 226.0 (H) 12/26/2020   Lab Results  Component Value Date   CHOLHDL 7 07/12/2023   CHOLHDL 4 12/29/2021   CHOLHDL 4 12/26/2020   Lab Results  Component Value Date   LDLDIRECT 202.0 07/12/2023   LDLDIRECT 94.0 12/29/2021   LDLDIRECT 96.0 12/26/2020   The 10-year ASCVD risk  score (Arnett DK, et al., 2019) is: 43.4%   Values used to calculate the score:     Age: 29 years     Sex: Female     Is Non-Hispanic African American: No     Diabetic: Yes     Tobacco smoker: Yes     Systolic Blood Pressure: 128 mmHg     Is BP treated: Yes     HDL Cholesterol: 37.2 mg/dL     Total Cholesterol: 261 mg/dL I have reviewed the PMH, Fam and Soc history. Patient Active Problem List   Diagnosis Date Noted Date Diagnosed   Chronic kidney disease, stage 3a (HCC) 02/07/2021     Priority: High    Renal ultrasound: mild increased echogenecity c/w medicorenal disease. Renal cysts w/ benign appearing f/u MRI Started farxiga 06/2022    History of heart artery stent 02/10/2020     Priority: High   Simple  chronic bronchitis (HCC) 02/11/2018     Priority: High    SPIRO NML 01/2018     Hyperlipidemia associated with type 2 diabetes mellitus (HCC) 06/04/2017     Priority: High   Type 2 diabetes mellitus with nephropathy (HCC) 09/10/2016     Priority: High   Hypertension associated with diabetes (HCC) 09/10/2016     Priority: High   Nicotine dependence, cigarettes, w unsp disorders 09/10/2016     Priority: High    lung cancer screening- 3/4 PPD- since age 81 prior 2 PPD. 1 PPD. Cut down since heart attack.     Coronary artery disease of native artery of native heart with stable angina pectoris [facility])      Priority: High    Asa.  Statin.  Dr. Anne Fu now- previouslyNo cardiology since 2012 due to insurance issues.  Dr. Dot Been stent with [facility] in [place]. Cobalt chromium stent 06/05/2011- toPCI/bare metal to mid circumflex. Has not seen him since.     Depression, major, recurrent, in partial remission (HCC)      Priority: High    reasonable control on venlafaxine 75mg  TID. xanax #14 lasts about 2 years for anxiety portion.     Primary osteoarthritis of both knees 10/24/2022     Priority: Medium    Cholelithiasis 10/04/2021     Priority: Medium     RUQ ultrasound: biliary sludge and stones    Claudication (HCC) 08/28/2021     Priority: Medium    History of sepsis 05/11/2021     Priority: Medium     2022; unclear source    Chronic hyponatremia 02/07/2021     Priority: Medium    Recurrent UTI 02/06/2021     Priority: Medium     Septra prophylaxis started march 2023    Bilateral renal cysts 02/06/2021     Priority: Medium     Renal ultrasound 4/20200 bilateral cysts; rec repeat in 6 months to recheck.  F/u MRI 01/2022: benign exophytic renal cyst. 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    Sessile serrated polyp of colon 08/12/2018     Priority: Medium     Sessile serrated polyp 2019, Dr. Lavon Paganini, repeat in 5 years    Osteopenia 06/10/2017     Priority:  Medium     Dexa: 07/2020 stable osteopenia, 06/10/17: osteopenia     Encounter for screening for lung cancer 02/10/2020     Priority: Low    Annual by chest CT; h/o benign appearing nodules.     Mild intermittent asthma 08/06/2019     Priority: Low   Essential tremor 10/24/2022     Social History: Patient  reports that she has been smoking cigarettes. She has a 24 pack-year smoking history. She has never used smokeless tobacco. She reports current alcohol use of about 4.0 standard drinks of alcohol per week. She reports that she does not use drugs.  Review of Systems: Ophthalmic: negative for eye pain, loss of vision or double vision Cardiovascular: negative for chest pain Respiratory: negative for SOB or persistent cough Gastrointestinal: negative for abdominal pain Genitourinary: negative for dysuria or gross hematuria MSK: negative for foot lesions Neurologic: negative for weakness or gait disturbance  Objective  Vitals: BP 128/74   Pulse (!) 58   Temp 97.9 F (36.6 C)   Ht 5\' 7"  (1.702 m)   Wt 188 lb 3.2 oz (85.4 kg)   LMP  (LMP Unknown)   SpO2 96%   BMI 29.48 kg/m  General: well appearing, no acute distress  Psych:  Alert and oriented, normal mood and affect HEENT:  Normocephalic, atraumatic, moist mucous membranes, supple neck  Cardiovascular:  Nl S1 and S2, RRR without murmur, gallop or rub. no edema Respiratory:  Good breath sounds bilaterally, CTAB with normal effort, no rales    Diabetic education: ongoing education regarding chronic disease management for diabetes was given today. We continue to reinforce the ABC's of diabetic management: A1c (<7 or 8 dependent upon patient), tight blood pressure control, and cholesterol  management with goal LDL < 100 minimally. We discuss diet strategies, exercise recommendations, medication options and possible side effects. At each visit, we review recommended immunizations and preventive care recommendations for diabetics and stress that good diabetic control can prevent other problems. See below for this patient's data.   Commons side effects, risks, benefits, and alternatives for medications and treatment plan prescribed today were discussed, and the patient expressed understanding of the given instructions. Patient is instructed to call or message via MyChart if he/she has any questions or concerns regarding our treatment plan. No barriers to understanding were identified. We discussed Red Flag symptoms and signs in detail. Patient expressed understanding regarding what to do in case of urgent or emergency type symptoms.  Medication list was reconciled, printed and provided to the patient in AVS. Patient instructions and summary information was reviewed with the patient as documented in the AVS. This note was prepared with assistance of Dragon voice recognition software. Occasional wrong-word or sound-a-like substitutions may have occurred due to the inherent limitations of voice recognition software

## 2023-11-13 NOTE — Patient Instructions (Addendum)
Please return in 3 months to recheck diabetes and blood pressure    I will release your lab results to you on your MyChart account with further instructions. You may see the results before I do, but when I review them I will send you a message with my report or have my assistant call you if things need to be discussed. Please reply to my message with any questions. Thank you!   If you have any questions or concerns, please don't hesitate to send me a message via MyChart or call the office at [phone]. Thank you for visiting with [place] today! It's our pleasure caring for you.   VISIT SUMMARY:  During today's visit, we discussed your ongoing issues with fatigue and daytime sleepiness, as well as your management of diabetes, hyperlipidemia, and depression. We also addressed your concerns about medication costs and your persistent allergy symptoms. Additionally, we reviewed your general health maintenance needs, including upcoming screenings and tests.  YOUR PLAN:  -DAYTIME SOMNOLENCE: Daytime somnolence means feeling excessively sleepy during the day. We suspect your allergies might be contributing to this fatigue. We recommend switching to a non-sedating allergy pill like Allegra or Zyrtec to help improve your daytime alertness and nighttime sleep.  -ALLERGIC RHINITIS: Allergic rhinitis is inflammation of the nasal passages due to allergies, causing symptoms like nasal congestion. Switching to a non-sedating allergy pill such as Allegra or Zyrtec could help with your daytime alertness and nighttime sleep.  -DIABETES MELLITUS TYPE 2: Diabetes Mellitus Type 2 is a condition where your body does not use insulin properly, leading to high blood sugar levels. Your recent A1c levels have improved to 6.5. Continue taking your current medications (Farxiga and metformin) and maintain your improved diet to keep your A1c levels stable.  -HYPERLIPIDEMIA: Hyperlipidemia means having high levels of fats (like  cholesterol) in your blood. You have restarted atorvastatin 40 mg to help manage this. We will check your LDL levels to see how well the medication is working.  -DEPRESSION: Depression is a mood disorder that causes persistent feelings of sadness and loss of interest. You are currently on venlafaxine and are concerned about medication costs with your upcoming insurance change. We advise evaluating the cost of venlafaxine under the new insurance plan to ensure you can continue your treatment without interruption.  -GENERAL HEALTH MAINTENANCE: General health maintenance includes routine screenings and tests to monitor your overall health. You are due for a colonoscopy and a bone density scan, and you need to continue annual lung cancer screening due to your smoking history. These screenings are important for early detection and management of potential health issues.  INSTRUCTIONS:  Please go to the lab to check your cholesterol levels. Follow up in three months to reassess your health status and the effectiveness of your medications. Additionally, reach out to [facility] gastroenterology to schedule a colonoscopy and call to schedule a bone density scan at [facility]. Continue with your annual lung cancer screening CTs.

## 2023-11-18 NOTE — Progress Notes (Signed)
Please call patient:lab results are stable. Cholesterol is better. Continue lipitor 40mg  nightly.

## 2023-12-10 ENCOUNTER — Other Ambulatory Visit: Payer: Self-pay | Admitting: *Deleted

## 2023-12-10 DIAGNOSIS — Z87891 Personal history of nicotine dependence: Secondary | ICD-10-CM

## 2023-12-10 DIAGNOSIS — F17210 Nicotine dependence, cigarettes, uncomplicated: Secondary | ICD-10-CM

## 2023-12-10 DIAGNOSIS — Z122 Encounter for screening for malignant neoplasm of respiratory organs: Secondary | ICD-10-CM

## 2023-12-13 ENCOUNTER — Telehealth: Payer: Self-pay

## 2023-12-13 DIAGNOSIS — N390 Urinary tract infection, site not specified: Secondary | ICD-10-CM

## 2023-12-13 NOTE — Telephone Encounter (Signed)
Copied from CRM [id number]. Topic: Clinical - Medical Advice >> Dec 13, 2023 10:06 AM Jessica Curry wrote: Reason for CRM: Patient would like to know if she can get approved for Curry blood test instead of having Curry colonoscopy.  Spoke with Jessica Curry and she stated that there is Curry blood test that she could do instead of doing Curry colonoscopy and she is also requesting Curry script B[facility]  Please Advise   Message has been sent to Dr. Mardelle Matte to Address

## 2023-12-19 MED ORDER — SULFAMETHOXAZOLE-TRIMETHOPRIM 800-160 MG PO TABS
1.0000 | ORAL_TABLET | Freq: Every day | ORAL | 3 refills | Status: DC
Start: 2023-12-19 — End: 2024-05-04

## 2023-12-19 NOTE — Addendum Note (Signed)
Addended by: Asencion Partridge on: 12/19/2023 08:35 PM   Modules accepted: Orders

## 2023-12-19 NOTE — Telephone Encounter (Signed)
Please call patient. There is no blood test for colon cancer screening and due to her history of polyps, a colonoscopy is advisable.   I have [facility].;

## 2023-12-20 NOTE — Telephone Encounter (Signed)
Spoke with pt regarding her questions/concerns with blood test from dr. Mardelle Matte and pt understood.

## 2023-12-31 ENCOUNTER — Other Ambulatory Visit: Payer: Medicare Other

## 2024-02-05 ENCOUNTER — Other Ambulatory Visit: Payer: Self-pay | Admitting: Family Medicine

## 2024-02-05 NOTE — Telephone Encounter (Unsigned)
 Copied from CRM [id number]. Topic: Clinical - Prescription Issue >> Feb 05, 2024  3:39 PM Theodis Sato wrote: Reason for CRM: Please send patients amLODipine (NORVASC) 5 MG tablet to her new preferred pharmacy [facility] - [place], [place] - [address] [place] [address] Phone: [phone] Fax: [phone]

## 2024-02-06 MED ORDER — AMLODIPINE BESYLATE 5 MG PO TABS: 5.0000 mg | ORAL_TABLET | Freq: Every day | ORAL | 3 refills | Status: DC

## 2024-02-27 ENCOUNTER — Telehealth: Payer: Self-pay

## 2024-02-27 ENCOUNTER — Other Ambulatory Visit: Payer: Self-pay

## 2024-02-27 MED ORDER — CLONIDINE HCL 0.1 MG PO TABS: 0.1000 mg | ORAL_TABLET | Freq: Three times a day (TID) | ORAL | 2 refills | Status: AC

## 2024-02-27 MED ORDER — LISINOPRIL 40 MG PO TABS: 40.0000 mg | ORAL_TABLET | Freq: Every day | ORAL | 2 refills | Status: AC

## 2024-02-27 NOTE — Telephone Encounter (Signed)
 Copied from CRM [id number]. Topic: Clinical - Medication Refill >> Feb 26, 2024 11:59 AM Jessica Curry H wrote: Most Recent Primary Care Visit:  Provider: Willow Ora  Department: [facility]  Visit Type: OFFICE VISIT  Date: 11/13/2023  Medication:  lisinopril (ZESTRIL) 40 MG tablet cloNIDine (CATAPRES) 0.1 MG tablet  Has the patient contacted their pharmacy? No  Is this the correct pharmacy for this prescription? Yes If no, delete pharmacy and type the correct one.  This is the patient's preferred pharmacy:  [facility] [place], [place] - [address] [facility] [address] [place] [address] Phone: [phone] Fax: [phone]   Has the prescription been filled recently? Yes  Is the patient out of the medication? Yes  Has the patient been seen for an appointment in the last year OR does the patient have an upcoming appointment? Yes  Can we respond through MyChart? No  Agent: Please be advised that Rx refills may take up to 3 business days. We ask that you follow-up with your pharmacy.  Medications has been sent to the pharmacy

## 2024-04-28 ENCOUNTER — Other Ambulatory Visit: Payer: Self-pay | Admitting: Family Medicine

## 2024-04-28 ENCOUNTER — Other Ambulatory Visit: Payer: Self-pay

## 2024-04-28 MED ORDER — VENLAFAXINE HCL ER 75 MG PO CP24: ORAL_CAPSULE | ORAL | 3 refills | Status: DC

## 2024-04-28 NOTE — Telephone Encounter (Unsigned)
 Copied from CRM [id number]. Topic: Clinical - Medication Refill >> Apr 28, 2024  3:02 PM Vivian Z wrote: Medication: venlafaxine  XR (EFFEXOR -XR) 75 MG 24 hr capsule ALPRAZolam  (XANAX ) 0.5 MG tablet  Has the patient contacted their pharmacy? No (Agent: If no, request that the patient contact the pharmacy for the refill. If patient does not wish to contact the pharmacy document the reason why and proceed with request.) (Agent: If yes, when and what did the pharmacy advise?) Needs meds sent to new pharmacy  This is the patient's preferred pharmacy:  [facility] - [place], [place] - [address] [place] [address] Phone: [phone] Fax: [phone]  Is this the correct pharmacy for this prescription? Yes If no, delete pharmacy and type the correct one.   Has the prescription been filled recently? No  Is the patient out of the medication? No  Has the patient been seen for an appointment in the last year OR does the patient have an upcoming appointment? Yes  Can we respond through MyChart? No  Agent: Please be advised that Rx refills may take up to 3 business days. We ask that you follow-up with your pharmacy.

## 2024-04-28 NOTE — Telephone Encounter (Signed)
 11/13/2023 LOV  07/12/2023 fill date  30/0 refills

## 2024-04-29 MED ORDER — ALPRAZOLAM 0.5 MG PO TABS: ORAL_TABLET | ORAL | 0 refills | Status: AC

## 2024-05-04 ENCOUNTER — Ambulatory Visit (INDEPENDENT_AMBULATORY_CARE_PROVIDER_SITE_OTHER): Payer: Medicare (Managed Care) | Admitting: Family Medicine

## 2024-05-04 ENCOUNTER — Encounter: Payer: Self-pay | Admitting: Family Medicine

## 2024-05-04 VITALS — BP 128/70 | HR 68 | Temp 97.9°F | Ht 67.0 in | Wt 190.2 lb

## 2024-05-04 DIAGNOSIS — I152 Hypertension secondary to endocrine disorders: Secondary | ICD-10-CM | POA: Diagnosis not present

## 2024-05-04 DIAGNOSIS — F3341 Major depressive disorder, recurrent, in partial remission: Secondary | ICD-10-CM | POA: Diagnosis not present

## 2024-05-04 DIAGNOSIS — I739 Peripheral vascular disease, unspecified: Secondary | ICD-10-CM | POA: Diagnosis not present

## 2024-05-04 DIAGNOSIS — E785 Hyperlipidemia, unspecified: Secondary | ICD-10-CM

## 2024-05-04 DIAGNOSIS — M8580 Other specified disorders of bone density and structure, unspecified site: Secondary | ICD-10-CM | POA: Diagnosis not present

## 2024-05-04 DIAGNOSIS — Z0001 Encounter for general adult medical examination with abnormal findings: Secondary | ICD-10-CM | POA: Diagnosis not present

## 2024-05-04 DIAGNOSIS — E1169 Type 2 diabetes mellitus with other specified complication: Secondary | ICD-10-CM | POA: Diagnosis not present

## 2024-05-04 DIAGNOSIS — Z780 Asymptomatic menopausal state: Secondary | ICD-10-CM

## 2024-05-04 DIAGNOSIS — Z7984 Long term (current) use of oral hypoglycemic drugs: Secondary | ICD-10-CM

## 2024-05-04 DIAGNOSIS — E1121 Type 2 diabetes mellitus with diabetic nephropathy: Secondary | ICD-10-CM | POA: Diagnosis not present

## 2024-05-04 DIAGNOSIS — J410 Simple chronic bronchitis: Secondary | ICD-10-CM | POA: Diagnosis not present

## 2024-05-04 DIAGNOSIS — N1831 Chronic kidney disease, stage 3a: Secondary | ICD-10-CM | POA: Diagnosis not present

## 2024-05-04 DIAGNOSIS — E1159 Type 2 diabetes mellitus with other circulatory complications: Secondary | ICD-10-CM

## 2024-05-04 DIAGNOSIS — E871 Hypo-osmolality and hyponatremia: Secondary | ICD-10-CM | POA: Diagnosis not present

## 2024-05-04 DIAGNOSIS — E119 Type 2 diabetes mellitus without complications: Secondary | ICD-10-CM

## 2024-05-04 DIAGNOSIS — D126 Benign neoplasm of colon, unspecified: Secondary | ICD-10-CM

## 2024-05-04 LAB — LIPID PANEL
Cholesterol: 168 mg/dL (ref 0–200)
HDL: 37.5 mg/dL — ABNORMAL LOW (ref >39.00)
LDL Cholesterol: 69 mg/dL (ref 0–99)
NonHDL: 130.92
Total CHOL/HDL Ratio: 4
Triglycerides: 312 mg/dL — ABNORMAL HIGH (ref 0.0–149.0)
VLDL: 62.4 mg/dL — ABNORMAL HIGH (ref 0.0–40.0)

## 2024-05-04 LAB — CBC WITH DIFFERENTIAL/PLATELET
Basophils Absolute: 0.1 10*3/uL (ref 0.0–0.1)
Basophils Relative: 0.8 % (ref 0.0–3.0)
Eosinophils Absolute: 0.2 10*3/uL (ref 0.0–0.7)
Eosinophils Relative: 2.7 % (ref 0.0–5.0)
HCT: 39.5 % (ref 36.0–46.0)
Hemoglobin: 13.4 g/dL (ref 12.0–15.0)
Lymphocytes Relative: 19.6 % (ref 12.0–46.0)
Lymphs Abs: 1.7 10*3/uL (ref 0.7–4.0)
MCHC: 33.8 g/dL (ref 30.0–36.0)
MCV: 87.2 fl (ref 78.0–100.0)
Monocytes Absolute: 0.6 10*3/uL (ref 0.1–1.0)
Monocytes Relative: 6.8 % (ref 3.0–12.0)
Neutro Abs: 6 10*3/uL (ref 1.4–7.7)
Neutrophils Relative %: 70.1 % (ref 43.0–77.0)
Platelets: 363 10*3/uL (ref 150.0–400.0)
RBC: 4.54 Mil/uL (ref 3.87–5.11)
RDW: 16.5 % — ABNORMAL HIGH (ref 11.5–15.5)
WBC: 8.5 10*3/uL (ref 4.0–10.5)

## 2024-05-04 LAB — POCT GLYCOSYLATED HEMOGLOBIN (HGB A1C): Hemoglobin A1C: 6.7 % — AB (ref 4.0–5.6)

## 2024-05-04 LAB — COMPREHENSIVE METABOLIC PANEL WITH GFR
ALT: 16 U/L (ref 0–35)
AST: 17 U/L (ref 0–37)
Albumin: 4.2 g/dL (ref 3.5–5.2)
Alkaline Phosphatase: 133 U/L — ABNORMAL HIGH (ref 39–117)
BUN: 19 mg/dL (ref 6–23)
CO2: 23 meq/L (ref 19–32)
Calcium: 10.4 mg/dL (ref 8.4–10.5)
Chloride: 101 meq/L (ref 96–112)
Creatinine, Ser: 1.27 mg/dL — ABNORMAL HIGH (ref 0.40–1.20)
GFR: 42.15 mL/min — ABNORMAL LOW (ref >60.00)
Glucose, Bld: 134 mg/dL — ABNORMAL HIGH (ref 70–99)
Potassium: 4.3 meq/L (ref 3.5–5.1)
Sodium: 133 meq/L — ABNORMAL LOW (ref 135–145)
Total Bilirubin: 0.3 mg/dL (ref 0.2–1.2)
Total Protein: 7.3 g/dL (ref 6.0–8.3)

## 2024-05-04 NOTE — Progress Notes (Signed)
 Subjective  Chief Complaint  Patient presents with   Hypertension   Diabetes    HPI: Jessica Curry is a 73 y.o. female who presents to [facility] today for a Female Wellness Visit. She also has the concerns and/or needs as listed above in the chief complaint. These will be addressed in addition to the Health Maintenance Visit.   Wellness Visit: annual visit with health maintenance review and exam  HM: overdue for crc screen (h/o polyps) and dexa for f/u osteopenia. Mammo is current.  Continues to happily smoke and not interested in quitting at this time.  Lung cancer screen is scheduled. Chronic disease f/u and/or acute problem visit: (deemed necessary to be done in addition to the wellness visit): Discussed the use of AI scribe software for clinical note transcription with the patient, who gave verbal consent to proceed.  History of Present Illness Jessica Curry "[name]" is a 73 year old female who presents for complete physical and follow-up of chronic problems.  Type 2 diabetes on oral medications and doing well.  No symptoms of hyperglycemia.  No foot concerns.  No retinopathy.  Eye exam is scheduled.  Hypertension and hyperlipidemia: Taking medications without concern.  No chest pain shortness of breath claudication extremity edema palpitations or myalgias.  She is not fasting today for recheck.  To follow-up on chronic hyponatremia.  Likely related to SSRI.  No mental status changes.  Chronic depression is controlled and Effexor  XR.  Overdue for bone density scan to follow-up on osteopenia.  COPD, simple.  Patient denies symptoms of shortness of breath.  She admits to daily productive cough.  Occasional wheezing.  Long-term smoker.  Lung cancer screening scheduled.   Assessment  1. Encounter for well adult exam with abnormal findings   2. Type 2 diabetes mellitus with nephropathy (HCC)   3. Diabetes mellitus treated with oral medication  (HCC)   4. Chronic kidney disease, stage 3a (HCC)   5. Depression, major, recurrent, in partial remission (HCC)   6. Hypertension associated with diabetes (HCC)   7. Chronic hyponatremia   8. Osteopenia, unspecified location   9. Sessile serrated polyp of colon   10. Hyperlipidemia associated with type 2 diabetes mellitus (HCC)   11. Claudication (HCC)   12. Simple chronic bronchitis (HCC) Chronic  13. Asymptomatic menopausal state      Plan  Female Wellness Visit: Age appropriate Health Maintenance and Prevention measures were discussed with patient. Included topics are cancer screening recommendations, ways to keep healthy (see AVS) including dietary and exercise recommendations, regular eye and dental care, use of seat belts, and avoidance of moderate alcohol use and tobacco use.  Patient to contact [facility] to set up overdue colonoscopy.  Bone density ordered.  Lung cancer.  screening scheduled.  Mammogram due in August  BMI: discussed patient's BMI and encouraged positive lifestyle modifications to help get to or maintain a target BMI. HM needs and immunizations were addressed and ordered. See below for orders. See HM and immunization section for updates.  Current Routine labs and screening tests ordered including cmp, cbc and lipids where appropriate. Discussed recommendations regarding Vit D and calcium  supplementation (see AVS)  Chronic disease management visit and/or acute problem visit: Assessment and Plan Assessment & Plan Tremors Experiencing tremors due to running out of medication. - Review and refill medication for tremors.  Diabetes Mellitus Type 2 Diabetes well-controlled with A1c of 6.5%. - Continue current diabetes management plan.  Continue  metformin  and Farxiga .  Monitor renal function and urine nephropathy screening.  Hypertension Blood pressure well-managed. - Continue current hypertension management plan.  Amlodipine , lisinopril  and  clonidine .  Smoking Continues to smoke, not interested in quitting. - Continue to encourage smoking cessation in future visits.  Hyperlipidemia on Lipitor 40.  Recheck liver function tests and nonfasting lipids today.  Depression is controlled on Effexor .  Continue.  Monitor sodium levels.   General Health Maintenance Overdue for colonoscopy and bone density scan, logistical concerns about transportation for colonoscopy. - Recommend scheduling colonoscopy with [facility]. - Recommend scheduling bone density scan.   Follow up: 6 months for follow-up chronic problems Orders Placed This Encounter  Procedures   DG Bone Density   CBC with Differential/Platelet   Comprehensive metabolic panel with GFR   Lipid panel   TSH   Microalbumin / creatinine urine ratio   POCT HgB A1C   No orders of the defined types were placed in this encounter.     Body mass index is 29.79 kg/m. Wt Readings from Last 3 Encounters:  05/04/24 190 lb 3.2 oz (86.3 kg)  11/13/23 188 lb 3.2 oz (85.4 kg)  08/12/23 190 lb 3.2 oz (86.3 kg)     Patient Active Problem List   Diagnosis Date Noted Date Diagnosed   Chronic kidney disease, stage 3a (HCC) 02/07/2021     Priority: High    Renal ultrasound: mild increased echogenecity c/w medicorenal disease. Renal cysts w/ benign appearing f/u MRI Started farxiga  06/2022    History of heart artery stent 02/10/2020     Priority: High   Simple chronic bronchitis (HCC) 02/11/2018     Priority: High    SPIRO NML 01/2018     Hyperlipidemia associated with type 2 diabetes mellitus (HCC) 06/04/2017     Priority: High   Type 2 diabetes mellitus with nephropathy (HCC) 09/10/2016     Priority: High   Hypertension associated with diabetes (HCC) 09/10/2016     Priority: High   Nicotine dependence, cigarettes, w unsp disorders 09/10/2016     Priority: High    lung cancer screening- 3/4 PPD- since age 51 prior 2 PPD. 1 PPD. Cut down since heart attack.      Coronary artery disease of native artery of native heart with stable angina pectoris [facility])      Priority: High    Asa.  Statin.  Dr. Renna Cary now- previouslyNo cardiology since 2012 due to insurance issues.  Dr. Amey Bal stent with [facility] in [place]. Cobalt chromium stent 06/05/2011- toPCI/bare metal to mid circumflex. Has not seen him since.     Depression, major, recurrent, in partial remission (HCC)      Priority: High    reasonable control on venlafaxine  75mg  TID. xanax  #14 lasts about 2 years for anxiety portion.     Primary osteoarthritis of both knees 10/24/2022     Priority: Medium    Cholelithiasis 10/04/2021     Priority: Medium     RUQ ultrasound: biliary sludge and stones    Claudication (HCC) 08/28/2021     Priority: Medium    History of sepsis 05/11/2021     Priority: Medium     2022; unclear source    Chronic hyponatremia 02/07/2021     Priority: Medium    Recurrent UTI 02/06/2021     Priority: Medium     Septra  prophylaxis started march 2023    Bilateral renal cysts 02/06/2021     Priority: Medium  Renal ultrasound 4/20200 bilateral cysts; rec repeat in 6 months to recheck.  F/u MRI 01/2022: benign exophytic renal cyst. 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    Sessile serrated polyp of colon 08/12/2018     Priority: Medium     Sessile serrated polyp 2019, Dr. Leonia Raman, repeat in 5 years    Osteopenia 06/10/2017     Priority: Medium     Dexa: 07/2020 stable osteopenia, 06/10/17: osteopenia     Encounter for screening for lung cancer 02/10/2020     Priority: Low    Annual by chest CT; h/o benign appearing nodules.     Mild intermittent asthma 08/06/2019     Priority: Low   Essential tremor  10/24/2022    Health Maintenance  Topic Date Due   DEXA SCAN  08/25/2022   Lung Cancer Screening  03/31/2024   Medicare Annual Wellness (AWV)  06/19/2024   COVID-19 Vaccine (5 - 2024-25 season) 05/20/2024 (Originally 07/28/2023)   Colonoscopy  11/12/2024 (Originally 08/13/2023)   INFLUENZA VACCINE  06/26/2024   MAMMOGRAM  07/09/2024   OPHTHALMOLOGY EXAM  07/10/2024   Diabetic kidney evaluation - Urine ACR  07/11/2024   FOOT EXAM  08/11/2024   HEMOGLOBIN A1C  11/03/2024   Diabetic kidney evaluation - eGFR measurement  11/12/2024   DTaP/Tdap/Td (3 - Td or Tdap) 12/05/2028   Pneumonia Vaccine 43+ Years old  Completed   Zoster Vaccines- Shingrix  Completed   Hepatitis C Screening  Addressed   HPV VACCINES  Aged Out   Meningococcal B Vaccine  Aged Out   Immunization History  Administered Date(s) Administered   Fluad Quad(high Dose 65+) 08/05/2019, 08/18/2020, 08/09/2021, 10/24/2022   Fluad Trivalent(High Dose 65+) 08/12/2023   Influenza, High Dose Seasonal PF 08/20/2017, 08/06/2018   Influenza-Unspecified 09/24/2016   PFIZER Comirnaty(Gray Top)Covid-19 Tri-Sucrose Vaccine 04/10/2021   PFIZER(Purple Top)SARS-COV-2 Vaccination 12/15/2019, 01/05/2020   Pfizer Covid-19 Vaccine Bivalent Booster 47yrs & up 09/04/2021   Pneumococcal Conjugate-13 09/10/2016   Pneumococcal Polysaccharide-23 12/02/2017   Tdap 08/20/2018, 12/05/2018   Zoster Recombinant(Shingrix) 06/05/2017, 09/18/2017   We updated and reviewed the patient's past history in detail and it is documented below. Allergies: Patient has no known allergies. Past Medical History Patient  has a past medical history of Allergy, Asthma, Chicken pox, Depression, Diabetes mellitus, Genital warts, History of heart artery stent (02/10/2020), Hyperlipidemia, and Hypertension. Past Surgical History Patient  has a past surgical history that includes Tonsillectomy (1968); Coronary angioplasty with stent; and Dilation and curettage of  uterus. Family History: Patient family history includes AAA (abdominal aortic aneurysm) in her mother; Arthritis in her brother and mother; Dementia in her maternal grandmother; Heart attack in her brother; Hyperlipidemia in her brother; Hypertension in her brother; Hypothyroidism in her mother; Kidney disease in her mother; Other in her father; Ovarian cancer in her mother; Stroke in her brother. Social History:  Patient  reports that she has been smoking cigarettes. She has a 24 pack-year smoking history. She has never used smokeless tobacco. She reports current alcohol use of about 4.0 standard drinks of alcohol per week. She reports that she does not use drugs.  Review of Systems: Constitutional: negative for fever or malaise Ophthalmic:  negative for photophobia, double vision or loss of vision Cardiovascular: negative for chest pain, dyspnea on exertion, or new LE swelling Respiratory: negative for SOB or persistent cough Gastrointestinal: negative for abdominal pain, change in bowel habits or melena Genitourinary: negative for dysuria or gross hematuria, no abnormal uterine bleeding or disharge Musculoskeletal: negative for new gait disturbance or muscular weakness Integumentary: negative for new or persistent rashes, no breast lumps Neurological: negative for TIA or stroke symptoms Psychiatric: negative for SI or delusions Allergic/Immunologic: negative for hives  Patient Care Team    Relationship Specialty Notifications Start End  Luevenia Saha, MD PCP - General Family Medicine  05/11/20   Hugh Madura, MD PCP - Cardiology Cardiology Admissions 03/27/18   Sergio Dandy, MD Consulting Physician Gastroenterology  11/13/23     Objective  Vitals: BP 128/70   Pulse 68   Temp 97.9 F (36.6 C)   Ht 5\' 7"  (1.702 m)   Wt 190 lb 3.2 oz (86.3 kg)   LMP  (LMP Unknown)   SpO2 96%   BMI 29.79 kg/m  General:  Well developed, well nourished, no acute distress  Psych:  Alert and  orientedx3,normal mood and affect HEENT:  Normocephalic, atraumatic, non-icteric sclera,  supple neck without adenopathy, mass or thyromegaly Cardiovascular:  Normal S1, S2, RRR without gallop, rub or murmur Respiratory:  Good breath sounds bilaterally, CTAB with normal respiratory effort Gastrointestinal: normal bowel sounds, soft, non-tender, no noted masses. No HSM MSK: extremities without edema, joints without erythema or swelling Neurologic:    Mental status is normal.  Gross motor and sensory exams are normal.  No tremor  Commons side effects, risks, benefits, and alternatives for medications and treatment plan prescribed today were discussed, and the patient expressed understanding of the given instructions. Patient is instructed to call or message via MyChart if he/she has any questions or concerns regarding our treatment plan. No barriers to understanding were identified. We discussed Red Flag symptoms and signs in detail. Patient expressed understanding regarding what to do in case of urgent or emergency type symptoms.  Medication list was reconciled, printed and provided to the patient in AVS. Patient instructions and summary information was reviewed with the patient as documented in the AVS. This note was prepared with assistance of Dragon voice recognition software. Occasional wrong-word or sound-a-like substitutions may have occurred due to the inherent limitations of voice recognition software

## 2024-05-07 ENCOUNTER — Telehealth: Payer: Self-pay | Admitting: Family Medicine

## 2024-05-07 LAB — TSH: TSH: 2 u[IU]/mL (ref 0.35–5.50)

## 2024-05-07 NOTE — Telephone Encounter (Signed)
 Noted

## 2024-05-07 NOTE — Telephone Encounter (Signed)
 Called Patient to schedule lab appointment. Patient stated that she will bring in sample with supplies given to her by lab (unable to give sample day of appointment)

## 2024-05-07 NOTE — Telephone Encounter (Signed)
 Copied from CRM [id number]. Topic: Clinical - Request for Lab/Test Order >> May 07, 2024  9:17 AM Allyne Areola wrote: Reason for CRM: Patient was seen on 05/04/2024 and per patient needed to leave a urine sample but didn't. I checked and did not see active orders. She would like to know if she still needs to come in to leave the sample.  Urine Micro future order placed  Please schedule a lab appt  [facility]

## 2024-05-07 NOTE — Addendum Note (Signed)
 Addended by: Alverda Joe on: 05/07/2024 11:41 AM   Modules accepted: Orders

## 2024-05-09 ENCOUNTER — Ambulatory Visit: Payer: Self-pay | Admitting: Family

## 2024-05-14 ENCOUNTER — Other Ambulatory Visit: Payer: Medicare (Managed Care)

## 2024-05-14 LAB — MICROALBUMIN / CREATININE URINE RATIO
Creatinine,U: 54.9 mg/dL
Microalb Creat Ratio: 434.1 mg/g — ABNORMAL HIGH (ref 0.0–30.0)
Microalb, Ur: 23.8 mg/dL — ABNORMAL HIGH (ref 0.0–1.9)

## 2024-05-14 NOTE — Progress Notes (Signed)
 Please call patient: lab results are all stable. Avoid sweets to keep diabetes under good control

## 2024-05-15 ENCOUNTER — Other Ambulatory Visit: Payer: Self-pay | Admitting: Family Medicine

## 2024-05-15 MED ORDER — ALBUTEROL SULFATE HFA 108 (90 BASE) MCG/ACT IN AERS: 2.0000 | INHALATION_SPRAY | Freq: Four times a day (QID) | RESPIRATORY_TRACT | 3 refills | Status: AC | PRN

## 2024-05-15 NOTE — Telephone Encounter (Signed)
 Copied from CRM [id number]. Topic: Clinical - Medication Refill >> May 15, 2024 12:37 PM Adonis Hoot wrote: Medication: albuterol  (VENTOLIN  HFA) 108 (90 Base) MCG/ACT inhaler  Has the patient contacted their pharmacy? Yes (Agent: If no, request that the patient contact the pharmacy for the refill. If patient does not wish to contact the pharmacy document the reason why and proceed with request.) (Agent: If yes, when and what did the pharmacy advise?)  This is the patient's preferred pharmacy:    [facility] [place], [place] - [address] [facility] [address] [place] [address] Phone: [phone] Fax: [phone]    Is this the correct pharmacy for this prescription? Yes If no, delete pharmacy and type the correct one.   Has the prescription been filled recently? No  Is the patient out of the medication? Yes  Has the patient been seen for an appointment in the last year OR does the patient have an upcoming appointment? Yes  Can we respond through MyChart? No  Agent: Please be advised that Rx refills may take up to 3 business days. We ask that you follow-up with your pharmacy.

## 2024-05-16 NOTE — Progress Notes (Signed)
+   microalbuminuria, on lisinopril  and farxiga

## 2024-05-19 ENCOUNTER — Inpatient Hospital Stay: Admission: RE | Admit: 2024-05-19 | Payer: Medicare (Managed Care) | Source: Ambulatory Visit

## 2024-05-28 ENCOUNTER — Inpatient Hospital Stay: Admission: RE | Admit: 2024-05-28 | Payer: Medicare (Managed Care) | Source: Ambulatory Visit

## 2024-06-24 ENCOUNTER — Telehealth: Payer: Self-pay | Admitting: Family Medicine

## 2024-06-24 ENCOUNTER — Ambulatory Visit

## 2024-06-24 MED ORDER — VENLAFAXINE HCL ER 75 MG PO CP24: ORAL_CAPSULE | ORAL | 1 refills | Status: AC

## 2024-06-24 NOTE — Telephone Encounter (Signed)
 Copied from CRM [id number]. Topic: Clinical - Medication Refill >> Jun 24, 2024 12:27 PM Suzen RAMAN wrote: Medication: venlafaxine  XR (EFFEXOR -XR) 75 MG 24 hr capsule **shipping with mail order will not ship til 07/04/24, patient is currently out of medication and needs 11 days supply to bridge her til the shipment arrives**   Has the patient contacted their pharmacy? Yes  This is the patient's preferred pharmacy:   [facility] [place], [place] - [address] [place] [address] Phone: [phone] Fax: [phone]   Is this the correct pharmacy for this prescription? Yes If no, delete pharmacy and type the correct one.   Has the prescription been filled recently? Yes  Is the patient out of the medication? Yes  Has the patient been seen for an appointment in the last year OR does the patient have an upcoming appointment? Yes  Can we respond through MyChart? No  Agent: Please be advised that Rx refills may take up to 3 business days. We ask that you follow-up with your pharmacy.

## 2024-07-28 ENCOUNTER — Encounter: Payer: Self-pay | Admitting: Pharmacist

## 2024-07-28 NOTE — Progress Notes (Signed)
 Pharmacy Quality Measure Review  Followed patient in 2024 and 2025 due to being at risk of failing the adherence measure for cholesterol (statin), diabetes, and hypertension (ACEi/ARB) medications.   Medication: Farxiga  / dapagliflozin  10mg  Last fill date: 07/16/2024 for 30 day supply. She has 3 refills remaining. No current follow up scheduled with PCP. Last visit was 05/04/2024 - Dr Jodie recommended f/u in 6 months.   Medication: metformin   Last fill date: 05/23/2024 for 90 day supply. No Refills remaining.   Medication: atorvastatin  40 mg Last fill date: 05/15/2024 for 90 day supply. No Refills remaining.   Medication: lisinopril  40mg  Last fill date: 06/09/2024 for 100 day supply. 1 refill remaining.   No action needed at this time regarding refilling maintenance medications.  She is past due to have yearly eye exam. Called patient to remind her to make appointment. She is planning to schedule soon but she is working with [employer] to determine cost due to her current [facility] optometrist is out of network.  Also discussed getting annual flu vaccine and patient is planning to get later in August.   Madelin Ray, PharmD Clinical Pharmacist [facility]  Population Health [phone]

## 2024-08-12 ENCOUNTER — Other Ambulatory Visit: Payer: Self-pay | Admitting: Family Medicine

## 2024-08-12 DIAGNOSIS — E1121 Type 2 diabetes mellitus with diabetic nephropathy: Secondary | ICD-10-CM

## 2024-08-19 ENCOUNTER — Other Ambulatory Visit: Payer: Self-pay | Admitting: Family Medicine

## 2024-08-27 ENCOUNTER — Ambulatory Visit: Payer: Medicare (Managed Care)

## 2024-09-03 ENCOUNTER — Ambulatory Visit: Payer: Medicare (Managed Care)

## 2024-09-04 ENCOUNTER — Telehealth: Payer: Self-pay

## 2024-09-04 ENCOUNTER — Other Ambulatory Visit: Payer: Self-pay | Admitting: Family Medicine

## 2024-09-04 ENCOUNTER — Other Ambulatory Visit: Payer: Self-pay

## 2024-09-04 DIAGNOSIS — Z1231 Encounter for screening mammogram for malignant neoplasm of breast: Secondary | ICD-10-CM

## 2024-09-04 DIAGNOSIS — Z1211 Encounter for screening for malignant neoplasm of colon: Secondary | ICD-10-CM

## 2024-09-04 DIAGNOSIS — Z780 Asymptomatic menopausal state: Secondary | ICD-10-CM

## 2024-09-04 NOTE — Telephone Encounter (Signed)
 Copied from CRM [id number]. Topic: Clinical - Request for Lab/Test Order >> Sep 04, 2024  2:32 PM Deaijah H wrote: Reason for CRM: Patient called in wanting to get scheduled for Colonoscopy and bone density. Order needed. Please call [phone]  Patient called needing orders placed for screenings over due per chart for Colonoscopy and Dexa Scan. Orders placed. Pt last seen PCP June 2025.

## 2024-09-07 ENCOUNTER — Ambulatory Visit
Admission: RE | Admit: 2024-09-07 | Discharge: 2024-09-07 | Disposition: A | Payer: Medicare (Managed Care) | Source: Ambulatory Visit | Attending: Family Medicine | Admitting: Family Medicine

## 2024-09-07 DIAGNOSIS — Z1231 Encounter for screening mammogram for malignant neoplasm of breast: Secondary | ICD-10-CM

## 2024-09-10 ENCOUNTER — Encounter: Payer: Self-pay | Admitting: Family Medicine

## 2024-09-10 ENCOUNTER — Ambulatory Visit: Payer: Medicare (Managed Care)

## 2024-09-10 LAB — OPHTHALMOLOGY REPORT-SCANNED

## 2024-09-11 ENCOUNTER — Other Ambulatory Visit: Payer: Self-pay | Admitting: Family Medicine

## 2024-09-11 DIAGNOSIS — E1169 Type 2 diabetes mellitus with other specified complication: Secondary | ICD-10-CM

## 2024-09-11 MED ORDER — ATORVASTATIN CALCIUM 40 MG PO TABS: 40.0000 mg | ORAL_TABLET | Freq: Every evening | ORAL | 3 refills | Status: AC

## 2024-09-11 NOTE — Telephone Encounter (Signed)
 Copied from CRM #[id number]. Topic: Clinical - Medication Refill >> Sep 11, 2024 12:25 PM Leah C wrote: Medication: atorvastatin  (LIPITOR) 40 MG tablet  Has the patient contacted their pharmacy? Yes, pharmacy called for refill.  (Agent: If no, request that the patient contact the pharmacy for the refill. If patient does not wish to contact the pharmacy document the reason why and proceed with request.) (Agent: If yes, when and what did the pharmacy advise?)  This is the patient's preferred pharmacy:  [facility] [place], [place] - [address] [facility] [address] [address] [place] [address] Phone: [phone] Fax: [phone]   Is this the correct pharmacy for this prescription? Yes If no, delete pharmacy and type the correct one.   Has the prescription been filled recently? Yes  Is the patient out of the medication? Yes  Has the patient been seen for an appointment in the last year OR does the patient have an upcoming appointment? Yes  Can we respond through MyChart? Yes  Agent: Please be advised that Rx refills may take up to 3 business days. We ask that you follow-up with your pharmacy.

## 2024-09-21 ENCOUNTER — Ambulatory Visit: Payer: Medicare (Managed Care)

## 2024-09-21 ENCOUNTER — Ambulatory Visit: Payer: Self-pay

## 2024-09-21 NOTE — Telephone Encounter (Signed)
Please see triage note and advise.  ?

## 2024-09-21 NOTE — Telephone Encounter (Signed)
 FYI Only or Action Required?: Action required by provider: clinical question for provider and update on patient condition.  Patient was last seen in primary care on 05/04/2024 by Jodie Lavern CROME, MD.  Called Nurse Triage reporting Fever and Tremors.  Symptoms began several days ago.  Interventions attempted: Nothing.  Symptoms are: fever (less than 100 degress), cough, runny nose, left ear congestion, upper torso tremors/shaking gradually worsening.  Triage Disposition: Call PCP Now (overriding See PCP Within 2 Weeks)- Patient would like to know if she should take her propranolol  for the tremors. Patient mentions she thinks the tremors are when her blood sugar is low, she states she doesn't check her blood sugar at home. RN advised she eat some applesauce immediately due to she has not eaten anything today  Patient/caregiver understands and will follow disposition?: Yes               Copied from CRM [id number]. Topic: Clinical - Red Word Triage >> Sep 21, 2024 11:08 AM Jessica Curry wrote: Red Word that prompted transfer to Nurse Triage: Patient cancelled AWV for today due to constant shaking and fever that started this morning Reason for Disposition  Muscle jerks, tics, or shudders are a chronic symptom (recurrent or ongoing AND present > 4 weeks)  Answer Assessment - Initial Assessment Questions 1. APPEARANCE of MOVEMENT: What did the jerking or twitching look like? (e.g., body area)     Tremors/shaking in upper torso.  2. ONSET: When did this start happening? (e.g., hours, days, weeks, months ago)     Off and on for a couple of days.  3. DURATION: How long does the jerk, twitch, or spasm last?     Couple of hours or less. She states if she takes deep breaths and tries to calm her mind she can control it better.  4. FREQUENCY:  How often does this happen?      Intermittent, happens regularly over a couple of days.  5. WHEN: When does this happen? (e.g., while  awake, while falling asleep, while sleeping)     While awake.  6. CAUSE: What do you think caused the tremors?     She states she thinks it comes on when her blood sugar is low. She states she doesn't check her blood sugar. She states the only thing she has had today so far is some water.   7. OTHER SYMPTOMS: Are there any other symptoms? (e.g., fever, headache)     Fever (less than 100 degrees, since last night), runny nose, ear congestion (left ear), cough.  RN advised patient to eat applesauce. She does not have any fruit juice to drink or candy close by. RN also advised patient to eat a meal. She would like to know if she should take propranolol  for the tremors. She cancelled her AWV for today due to she feels she shouldn't be driving.  Protocols used: Muscle Jerks - Tics - [facility]

## 2024-09-22 NOTE — Telephone Encounter (Signed)
 Please contact pt to schedule an appt per PCP

## 2024-09-22 NOTE — Telephone Encounter (Signed)
LVM to schedule ov with available provider

## 2024-09-28 ENCOUNTER — Ambulatory Visit: Payer: Medicare (Managed Care)

## 2024-09-28 VITALS — BP 124/62 | HR 65 | Temp 97.9°F | Ht 67.5 in | Wt 188.0 lb

## 2024-09-28 DIAGNOSIS — Z0000 Encounter for general adult medical examination without abnormal findings: Secondary | ICD-10-CM | POA: Diagnosis not present

## 2024-09-28 DIAGNOSIS — Z23 Encounter for immunization: Secondary | ICD-10-CM | POA: Diagnosis not present

## 2024-09-28 NOTE — Patient Instructions (Signed)
 Ms. Jessica Curry,  Thank you for taking the time for your Medicare Wellness Visit. I appreciate your continued commitment to your health goals. Please review the care plan we discussed, and feel free to reach out if I can assist you further.  Please note that Annual Wellness Visits do not include a physical exam. Some assessments may be limited, especially if the visit was conducted virtually. If needed, we may recommend an in-person follow-up with your provider.  Ongoing Care Seeing your primary care provider every 3 to 6 months helps us  monitor your health and provide consistent, personalized care.   Referrals If a referral was made during today's visit and you haven't received any updates within two weeks, please contact the referred provider directly to check on the status.  Recommended Screenings:  Health Maintenance  Topic Date Due   Screening for Lung Cancer  03/31/2024   Medicare Annual Wellness Visit  06/19/2024   Flu Shot  06/26/2024   COVID-19 Vaccine (5 - 2025-26 season) 07/27/2024   Complete foot exam   08/11/2024   Colon Cancer Screening  11/12/2024*   Hemoglobin A1C  11/03/2024   Yearly kidney function blood test for diabetes  05/04/2025   Yearly kidney health urinalysis for diabetes  05/14/2025   Breast Cancer Screening  09/07/2025   Eye exam for diabetics  09/10/2025   DEXA scan (bone density measurement)  09/04/2026   DTaP/Tdap/Td vaccine (3 - Td or Tdap) 12/05/2028   Pneumococcal Vaccine for age over 3  Completed   Zoster (Shingles) Vaccine  Completed   Hepatitis C Screening  Addressed   Meningitis B Vaccine  Aged Out  *Topic was postponed. The date shown is not the original due date.       06/20/2023    3:24 PM  Advanced Directives  Does Patient Have a Medical Advance Directive? Yes  Type of Estate Agent of [place];Living will  Copy of Healthcare Power of Attorney in Chart? No - copy requested    Vision: Annual vision screenings are  recommended for early detection of glaucoma, cataracts, and diabetic retinopathy. These exams can also reveal signs of chronic conditions such as diabetes and high blood pressure.  Dental: Annual dental screenings help detect early signs of oral cancer, gum disease, and other conditions linked to overall health, including heart disease and diabetes.  Please see the attached documents for additional preventive care recommendations.

## 2024-09-28 NOTE — Progress Notes (Addendum)
 Subjective:   Jessica Curry is a 73 y.o. female who presents for a Medicare Annual Wellness Visit.  Allergies (verified) Patient has no known allergies.   History: Past Medical History:  Diagnosis Date   Allergy    Asthma    Chicken pox    Depression    reasonable control on venlafaxine  75mg  TID. xanax  #14 lasts about 2 years for anxiety portion.    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 [employer]   Heart attack Brother    Arthritis Brother        hip replacement   Hyperlipidemia Brother    Hypertension Brother    Colon cancer Neg Hx    Esophageal cancer Neg Hx    Rectal cancer Neg Hx    Stomach cancer Neg Hx    Breast cancer Neg Hx    Social History   Occupational History   Not on file  Tobacco Use   Smoking status: Every Day    Current packs/day: 0.50    Average packs/day: 0.5 packs/day for 48.0 years (24.0 ttl pk-yrs)    Types: Cigarettes   Smokeless tobacco: Never   Tobacco comments:    handouts provided   Vaping Use   Vaping status: Never Used  Substance and Sexual Activity   Alcohol use: Not Currently    Alcohol/week: 4.0 standard drinks of alcohol    Types: 4 Cans of beer per week   Drug use: No   Sexual activity: Yes   Tobacco Counseling Ready to quit: Not Answered Counseling given: Not Answered Tobacco comments: handouts provided   SDOH Screenings    Food Insecurity: No Food Insecurity (09/28/2024)  Housing: Unknown (09/28/2024)  Transportation Needs: No Transportation Needs (09/28/2024)  Utilities: Not At Risk (09/28/2024)  Depression (PHQ2-9): Low Risk  (09/28/2024)  Financial Resource Strain: Low Risk  (06/20/2023)  Physical Activity: Insufficiently Active (09/28/2024)  Social Connections: Socially Isolated (09/28/2024)  Stress: Stress Concern Present (09/28/2024)  Tobacco Use: High Risk (09/28/2024)  Health Literacy: Adequate Health Literacy (09/28/2024)   Depression Screen    09/28/2024   10:20 AM 05/04/2024   10:28 AM 11/13/2023   11:35 AM 08/12/2023   10:51 AM 08/12/2023   10:49 AM 07/12/2023   11:38 AM 06/20/2023    3:20 PM  PHQ 2/9 Scores  PHQ - 2 Score 1 0 4 5 0 2 2  PHQ- 9 Score   10 13 0 10 4     Goals Addressed               This Visit's Progress     lose 30 lbs (pt-stated)        Lose 30 lbs with in  1 year        Visit info / Clinical Intake: Medicare Wellness Visit Type:: Subsequent Annual Wellness Visit Medicare Wellness Visit Mode:: In-person (required for WTM) Interpreter Needed?: No Pre-visit prep was completed: yes AWV questionnaire completed by patient prior to visit?: no Living arrangements:: (!) lives alone Patient's Overall Health Status Rating: good Typical amount of pain: none Does pain affect daily life?: no Are you currently prescribed opioids?: no  Dietary Habits and Nutritional Risks How many meals a day?: 4 Eats fruit and vegetables daily?: yes Most meals are obtained by: preparing own meals Diabetic:: (!) yes Any non-healing wounds?: no How often do you check your BS?: 0 Would you like to be referred to a Nutritionist or for Diabetic Management? : no  Functional Status Activities of Daily Living (to include ambulation/medication): Independent Ambulation: Independent with device- listed below Home Assistive Devices/Equipment: Eyeglasses Medication Administration: Independent Home  Management: Independent Manage your own finances?: yes Primary transportation is: driving Concerns about vision?: no *vision screening is required for WTM* Concerns about hearing?: no  Fall Screening Falls in the past year?: 1 Number of falls in past year: 1 Was there an injury with Fall?: 0 Fall Risk Category Calculator: 2 Patient Fall Risk Level: Moderate Fall Risk  Fall Risk Patient at Risk for Falls Due to: History of fall(s); Impaired balance/gait; Impaired mobility Fall risk Follow up: Falls prevention discussed  Home and Transportation Safety: All rugs have non-skid backing?: (!) no All stairs or steps have railings?: yes Grab bars in the bathtub or shower?: (!) no Have non-skid surface in bathtub or shower?: yes Good home lighting?: yes Regular seat belt use?: yes Hospital stays in the last year:: no  Cognitive Assessment Difficulty concentrating, remembering, or making decisions? : no Will 6CIT or Mini Cog be Completed: no 6CIT or Mini Cog Declined: patient alert, oriented, able to answer questions appropriately and recall recent events  Advance Directives (For Healthcare) Does Patient Have a Medical Advance Directive?: Yes Type of Advance Directive: Living will; Healthcare Power of Attorney Copy of Healthcare Power of Attorney in Chart?: No - copy requested Copy of Living Will in Chart?: No - copy requested  Reviewed/Updated  Reviewed/Updated: All        Objective:    Today's Vitals   09/28/24 1012  BP: 124/62  Pulse: 65  Temp: 97.9 F (36.6 C)  SpO2: 96%  Weight: 188 lb (85.3 kg)  Height: 5' 7.5 (1.715 m)   Body mass index is 29.01 kg/m.  Current Medications (verified) Outpatient Encounter Medications as of 09/28/2024  Medication Sig   albuterol  (VENTOLIN  HFA) 108 (90 Base) MCG/ACT inhaler Inhale 2 puffs into the lungs every 6 (six) hours as needed for wheezing or shortness of breath.   ALPRAZolam  (XANAX ) 0.5 MG tablet TAKE ONE TABLET BY MOUTH  DAILY AS NEEDED FOR ANXIETY   amLODipine  (NORVASC ) 5 MG tablet Take 1 tablet (5 mg total) by mouth daily.   aspirin  81 MG tablet Take 81 mg by mouth at bedtime.   atorvastatin  (LIPITOR) 40 MG tablet Take 1 tablet (40 mg total) by mouth at bedtime.   cloNIDine  (CATAPRES ) 0.1 MG tablet Take 1 tablet (0.1 mg total) by mouth 3 (three) times daily.   cyclobenzaprine  (FLEXERIL ) 10 MG tablet TAKE ONE TABLET BY MOUTH AT BEDTIME AS NEEDED FOR MUSCLE SPASMS   diphenhydrAMINE (BENADRYL) 25 MG tablet Take 25 mg by mouth every 6 (six) hours as needed for allergies or sleep.   FARXIGA  10  MG TABS tablet TAKE ONE TABLET EVERY DAY BEFORE BREAKFAST   lisinopril  (ZESTRIL ) 40 MG tablet Take 1 tablet (40 mg total) by mouth daily.   metFORMIN  (GLUCOPHAGE ) 500 MG tablet Take 1 tablet (500 mg total) by mouth 2 (two) times daily with a meal.   Multiple Vitamins-Minerals (MULTIVITAMIN WITH MINERALS) tablet Take 1 tablet by mouth daily. Centrum silver for women   nitroGLYCERIN  (NITROSTAT ) 0.4 MG SL tablet Place 1 tablet (0.4 mg total) under the tongue every 5 (five) minutes as needed for chest pain.   venlafaxine  XR (EFFEXOR -XR) 75 MG 24 hr capsule TAKE 3 CAPSULES BY MOUTH  DAILY WITH BREAKFAST   No facility-administered encounter medications on file as of 09/28/2024.   Hearing/Vision screen Hearing Screening - Comments:: Pt denies any hearing issues  Vision Screening - Comments:: Wears rx glasses - up to date with routine eye exams with Dr Adine Haddock  Immunizations and Health Maintenance Health Maintenance  Topic Date Due   Lung Cancer Screening  03/31/2024   COVID-19 Vaccine (5 - 2025-26 season) 07/27/2024   FOOT EXAM  08/11/2024   Colonoscopy  11/12/2024 (Originally 08/13/2023)   HEMOGLOBIN A1C  11/03/2024   Diabetic kidney evaluation - eGFR measurement  05/04/2025   Diabetic kidney evaluation - Urine ACR  05/14/2025   Mammogram  09/07/2025   OPHTHALMOLOGY EXAM  09/10/2025   Medicare Annual Wellness (AWV)   09/28/2025   DEXA SCAN  09/04/2026   DTaP/Tdap/Td (3 - Td or Tdap) 12/05/2028   Pneumococcal Vaccine: 50+ Years  Completed   Influenza Vaccine  Completed   Zoster Vaccines- Shingrix  Completed   Hepatitis C Screening  Addressed   Meningococcal B Vaccine  Aged Out        Assessment/Plan:  This is a routine wellness examination for [facility].  Patient Care Team: Jodie Lavern CROME, MD as PCP - General (Family Medicine) Jeffrie Oneil BROCKS, MD as PCP - Cardiology (Cardiology) Shila Gustav GAILS, MD as Consulting Physician (Gastroenterology)  I have personally reviewed and noted the following in the patient's chart:   Medical and social history Use of alcohol, tobacco or illicit drugs  Current medications and supplements including opioid prescriptions. Functional ability and status Nutritional status Physical activity Advanced directives List of other physicians Hospitalizations, surgeries, and ER visits in previous 12 months Vitals Screenings to include cognitive, depression, and falls Referrals and appointments  Orders Placed This Encounter  Procedures   Flu vaccine HIGH DOSE PF(Fluzone Trivalent)   Ambulatory Referral for Lung Cancer Screening [REF832]    Referral Priority:   Routine    Referral Type:   Consultation    Referral Reason:   Specialty Services Required    Number of Visits Requested:   1   In addition, I have reviewed and discussed with patient certain preventive protocols, quality metrics, and best practice recommendations. A written personalized care plan for preventive services as well as general preventive health recommendations were provided to patient.   Ellouise VEAR Haws, LPN   88/04/7973   Return in 1 year (on 09/28/2025).  After Visit Summary: (In Person-Declined) Patient declined AVS at this time.  Nurse Notes: nothing significant at this time

## 2024-10-01 ENCOUNTER — Encounter: Payer: Self-pay | Admitting: Family Medicine

## 2024-10-01 ENCOUNTER — Ambulatory Visit (INDEPENDENT_AMBULATORY_CARE_PROVIDER_SITE_OTHER): Payer: Medicare (Managed Care) | Admitting: Family Medicine

## 2024-10-01 VITALS — BP 128/72 | HR 69 | Temp 97.7°F | Ht 67.5 in | Wt 186.2 lb

## 2024-10-01 DIAGNOSIS — Z122 Encounter for screening for malignant neoplasm of respiratory organs: Secondary | ICD-10-CM | POA: Diagnosis not present

## 2024-10-01 DIAGNOSIS — I152 Hypertension secondary to endocrine disorders: Secondary | ICD-10-CM | POA: Diagnosis not present

## 2024-10-01 DIAGNOSIS — J410 Simple chronic bronchitis: Secondary | ICD-10-CM | POA: Diagnosis not present

## 2024-10-01 DIAGNOSIS — D126 Benign neoplasm of colon, unspecified: Secondary | ICD-10-CM

## 2024-10-01 DIAGNOSIS — E1159 Type 2 diabetes mellitus with other circulatory complications: Secondary | ICD-10-CM

## 2024-10-01 DIAGNOSIS — F17219 Nicotine dependence, cigarettes, with unspecified nicotine-induced disorders: Secondary | ICD-10-CM | POA: Diagnosis not present

## 2024-10-01 DIAGNOSIS — N390 Urinary tract infection, site not specified: Secondary | ICD-10-CM

## 2024-10-01 DIAGNOSIS — E1121 Type 2 diabetes mellitus with diabetic nephropathy: Secondary | ICD-10-CM

## 2024-10-01 LAB — POCT GLYCOSYLATED HEMOGLOBIN (HGB A1C): Hemoglobin A1C: 6.5 % — AB (ref 4.0–5.6)

## 2024-10-01 MED ORDER — DICLOFENAC SODIUM 1 % EX GEL: 4.0000 g | Freq: Four times a day (QID) | CUTANEOUS | 5 refills | Status: AC | PRN

## 2024-10-01 NOTE — Progress Notes (Signed)
 Subjective  CC:  Chief Complaint  Patient presents with   Hypertension   Hyperlipidemia    HPI: Jessica Curry is a 73 y.o. female who presents to the office today for follow up of diabetes and problems listed above in the chief complaint.  Discussed the use of AI scribe software for clinical note transcription with the patient, who gave verbal consent to proceed.  History of Present Illness Jessica Curry is a 73 year old female with knee osteoarthritis who presents with knee pain and hot flashes.  Knee pain and functional impairment - Severe bilateral knee pain due to osteoarthritis, described as 'bone on bone' by orthopedist - Joint instability affecting ability to perform activities such as grocery shopping - Limping after prolonged periods on concrete surfaces - Received knee injections several years ago; effectiveness unknown - No recent use of topical treatments; open to trying Voltaren  gel  Vasomotor symptoms - Recurrent hot flashes primarily in the late afternoon - Episodes last 30 to 45 minutes - Sensation described as 'getting hot' rather than a quick flash - No association with alcohol consumption; alcohol intake has been reduced  Recurrent urinary tract infections - History of recurrent urinary tract infections - Currently taking a medication prescribed after a series of infections - No further episodes of urinary tract infection since starting medication  Cutaneous changes - Development of 'little rough things' on the face since August - Attempted to see a dermatologist but was unable to due to insurance issues  Respiratory and allergic symptoms - History of smoking - Current wheezing and increased allergy symptoms - Uses low-dose Benadryl at night for symptom relief  Cancer screening - Missed lung cancer screening appointment due to communication issue; awaiting rescheduling - Due for colon cancer screening  Diabetes mellitus and weight  loss - Diabetes well-controlled with recent HbA1c of 6.5 on farxiga  10 - Recent weight loss to 186 pounds attributed to lifestyle changes - no known complications  - feels well  HTN: bp improved today on clonidine , amlodipine  5 and lisinopril  40. No cp or sob.  Still smokes; doesn't want to quit  Wt Readings from Last 3 Encounters:  10/01/24 186 lb 3.2 oz (84.5 kg)  09/28/24 188 lb (85.3 kg)  05/04/24 190 lb 3.2 oz (86.3 kg)    BP Readings from Last 3 Encounters:  10/01/24 128/72  09/28/24 124/62  05/04/24 128/70    Assessment  1. Type 2 diabetes mellitus with nephropathy (HCC)   2. Encounter for screening for lung cancer   3. Nicotine dependence, cigarettes, w unsp disorders   4. Simple chronic bronchitis (HCC)   5. Sessile serrated polyp of colon   6. Hypertension associated with diabetes (HCC)      Plan  Assessment and Plan Assessment & Plan Osteoarthritis of bilateral knees Severe osteoarthritis with bone-on-bone contact, causing significant pain and instability, especially after prolonged activity. Previous steroid injections were beneficial, but there is concern about the number of injections received. Viscous supplementation is less effective in severe cases. - Ordered Voltaren  topical gel for application up to four times a day. - Referred to orthopedist for evaluation and potential steroid injection.  Type 2 diabetes mellitus without complications Diabetes is well-controlled with a recent HbA1c of 6.5%. no changes today  Menopausal symptoms (hot flashes) Experiencing hot flashes primarily in the late afternoon, lasting 30-45 minutes. Symptoms are not as timed as previous episodes in her 50s or 60s. No current sexual activity and reduced alcohol intake.  Nicotine dependence, cigarettes Continued smoking, which negatively impacts skin health and contributes to chronic bronchitis.  Simple chronic bronchitis Chronic bronchitis likely exacerbated by smoking and  allergy season. Reports wheezing and increased use of tissues due to allergies.  Allergic rhinitis Experiencing sneezing and nasal congestion, likely due to allergy season. Currently using low-dose Benadryl at night for symptom relief. - Prescribed generic Zyrtec for allergy management.  Recurrent urinary tract infections; now stable on septra  daily prophylaxis. No recent urinary tract infections. Current medication is effective in preventing infections.  Htn: good control.  Chronic hyponatremia: avoid diuretics  General Health Maintenance Lung cancer and colon cancer screenings were not completed due to communication issues. She is willing to reschedule these screenings. - Will reschedule lung cancer screening. - Will reschedule colon cancer screening.    Follow up: 6 mo for cpe Orders Placed This Encounter  Procedures   POCT HgB A1C   Meds ordered this encounter  Medications   diclofenac  Sodium (VOLTAREN ) 1 % GEL    Sig: Apply 4 g topically 4 (four) times daily as needed (knee pain).    Dispense:  350 g    Refill:  5      Immunization History  Administered Date(s) Administered   Fluad Quad(high Dose 65+) 08/05/2019, 08/18/2020, 08/09/2021, 10/24/2022   Fluad Trivalent(High Dose 65+) 08/12/2023   INFLUENZA, HIGH DOSE SEASONAL PF 08/20/2017, 08/06/2018, 09/28/2024   Influenza-Unspecified 09/24/2016   PFIZER Comirnaty(Gray Top)Covid-19 Tri-Sucrose Vaccine 04/10/2021   PFIZER(Purple Top)SARS-COV-2 Vaccination 12/15/2019, 01/05/2020   Pfizer Covid-19 Vaccine Bivalent Booster 82yrs & up 09/04/2021   Pneumococcal Conjugate-13 09/10/2016   Pneumococcal Polysaccharide-23 12/02/2017   Tdap 08/20/2018, 12/05/2018   Zoster Recombinant(Shingrix) 06/05/2017, 09/18/2017    Diabetes Related Lab Review: Lab Results  Component Value Date   HGBA1C 6.5 (A) 10/01/2024   HGBA1C 6.7 (A) 05/04/2024   HGBA1C 6.5 (A) 11/13/2023    Lab Results  Component Value Date   MICROALBUR 23.8  (H) 05/14/2024   Lab Results  Component Value Date   CREATININE 1.27 (H) 05/04/2024   BUN 19 05/04/2024   NA 133 (L) 05/04/2024   K 4.3 05/04/2024   CL 101 05/04/2024   CO2 23 05/04/2024   Lab Results  Component Value Date   CHOL 168 05/04/2024   CHOL 156 11/13/2023   CHOL 261 (H) 07/12/2023   Lab Results  Component Value Date   HDL 37.50 (L) 05/04/2024   HDL 39.90 11/13/2023   HDL 37.20 (L) 07/12/2023   Lab Results  Component Value Date   LDLCALC 69 05/04/2024   LDLCALC 82 11/13/2023   LDLCALC 78 09/15/2020   Lab Results  Component Value Date   TRIG 312.0 (H) 05/04/2024   TRIG 172.0 (H) 11/13/2023   TRIG 360.0 (H) 07/12/2023   Lab Results  Component Value Date   CHOLHDL 4 05/04/2024   CHOLHDL 4 11/13/2023   CHOLHDL 7 07/12/2023   Lab Results  Component Value Date   LDLDIRECT 202.0 07/12/2023   LDLDIRECT 94.0 12/29/2021   LDLDIRECT 96.0 12/26/2020   The ASCVD Risk score (Arnett DK, et al., 2019) failed to calculate for the following reasons:   Risk score cannot be calculated because patient has a medical history suggesting prior/existing ASCVD I have reviewed the PMH, Fam and Soc history. Patient Active Problem List   Diagnosis Date Noted   Chronic kidney disease, stage 3a (HCC) 02/07/2021    Priority: High    Renal ultrasound: mild increased echogenecity c/w medicorenal disease. Renal cysts  w/ benign appearing f/u MRI Started farxiga  06/2022    History of heart artery stent 02/10/2020    Priority: High   Simple chronic bronchitis (HCC) 02/11/2018    Priority: High    SPIRO NML 01/2018     Hyperlipidemia associated with type 2 diabetes mellitus (HCC) 06/04/2017    Priority: High   Type 2 diabetes mellitus with nephropathy (HCC) 09/10/2016    Priority: High   Hypertension associated with diabetes (HCC) 09/10/2016    Priority: High   Nicotine dependence, cigarettes, w unsp disorders 09/10/2016    Priority: High    lung cancer screening- 3/4 PPD-  since age 19 prior 2 PPD. 1 PPD. Cut down since heart attack.     Coronary artery disease of native artery of native heart with stable angina pectoris     Priority: High    Asa.  Statin.  Dr. Jeffrie now- previouslyNo cardiology since 2012 due to insurance issues.  Dr. Maralee stent with [facility] in [place]. Cobalt chromium stent 06/05/2011- toPCI/bare metal to mid circumflex. Has not seen him since.     Depression, major, recurrent, in partial remission     Priority: High    reasonable control on venlafaxine  75mg  TID. xanax  #14 lasts about 2 years for anxiety portion.     Primary osteoarthritis of both knees 10/24/2022    Priority: Medium    Cholelithiasis 10/04/2021    Priority: Medium     RUQ ultrasound: biliary sludge and stones    Claudication 08/28/2021    Priority: Medium    History of sepsis 05/11/2021    Priority: Medium     2022; unclear source    Chronic hyponatremia 02/07/2021    Priority: Medium    Recurrent UTI 02/06/2021    Priority: Medium     Septra  prophylaxis started march 2023    Bilateral renal cysts 02/06/2021    Priority: Medium     Renal ultrasound 4/20200 bilateral cysts; rec repeat in 6 months to recheck.  F/u MRI 01/2022: benign exophytic renal cyst. 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    Sessile serrated polyp of colon 08/12/2018    Priority: Medium     Sessile serrated polyp 2019, Dr. Shila, repeat in 5 years    Osteopenia 06/10/2017    Priority: Medium     Dexa: 07/2020 stable osteopenia, 06/10/17: osteopenia     Encounter for screening for lung cancer 02/10/2020    Priority: Low    Annual by chest CT; h/o benign appearing nodules.     Mild intermittent asthma 08/06/2019     Priority: Low   Essential tremor 10/24/2022    Social History: Patient  reports that she has been smoking cigarettes. She has a 24 pack-year smoking history. She has never used smokeless tobacco. She reports that she does not currently use alcohol after a past usage of about 4.0 standard drinks of alcohol per week. She reports that she does not use drugs.  Review of Systems: Ophthalmic: negative for eye pain, loss of vision or double vision Cardiovascular: negative for chest pain Respiratory: negative for SOB or persistent cough Gastrointestinal: negative for abdominal pain Genitourinary: negative for dysuria or gross hematuria MSK: negative  for foot lesions Neurologic: negative for weakness or gait disturbance  Objective  Vitals: BP 128/72   Pulse 69   Temp 97.7 F (36.5 C)   Ht 5' 7.5 (1.715 m)   Wt 186 lb 3.2 oz (84.5 kg)   LMP  (LMP Unknown)   SpO2 95%   BMI 28.73 kg/m  General: well appearing, no acute distress  Psych:  Alert and oriented, normal mood and affect HEENT:  Normocephalic, atraumatic, moist mucous membranes, supple neck  Cardiovascular:  Nl S1 and S2, RRR without murmur, gallop or rub. no edema Respiratory:  Good breath sounds bilaterally, CTAB with normal effort, no rales Gastrointestinal: normal BS, soft, nontender  Diabetic education: ongoing education regarding chronic disease management for diabetes was given today. We continue to reinforce the ABC's of diabetic management: A1c (<7 or 8 dependent upon patient), tight blood pressure control, and cholesterol management with goal LDL < 100 minimally. We discuss diet strategies, exercise recommendations, medication options and possible side effects. At each visit, we review recommended immunizations and preventive care recommendations for diabetics and stress that good diabetic control can prevent other problems. See below for this patient's data. Commons side effects, risks, benefits, and alternatives for  medications and treatment plan prescribed today were discussed, and the patient expressed understanding of the given instructions. Patient is instructed to call or message via MyChart if he/she has any questions or concerns regarding our treatment plan. No barriers to understanding were identified. We discussed Red Flag symptoms and signs in detail. Patient expressed understanding regarding what to do in case of urgent or emergency type symptoms.  Medication list was reconciled, printed and provided to the patient in AVS. Patient instructions and summary information was reviewed with the patient as documented in the AVS. This note was prepared with assistance of Dragon voice recognition software. Occasional wrong-word or sound-a-like substitutions may have occurred due to the inherent limitations of voice recognition software

## 2024-10-01 NOTE — Patient Instructions (Addendum)
 Please return in 6 months for your annual complete physical; please come fasting.  For follow up on chronic medical conditions   If you have any questions or concerns, please don't hesitate to send me a message via MyChart or call the office at [phone]. Thank you for visiting with us  today! It's our pleasure caring for you.

## 2024-10-20 ENCOUNTER — Ambulatory Visit
Admission: RE | Admit: 2024-10-20 | Discharge: 2024-10-20 | Disposition: A | Discharge status: Home / Self Care | Payer: Medicare (Managed Care) | Source: Ambulatory Visit | Attending: Acute Care | Admitting: Acute Care

## 2024-10-20 DIAGNOSIS — F17210 Nicotine dependence, cigarettes, uncomplicated: Secondary | ICD-10-CM

## 2024-10-20 DIAGNOSIS — Z122 Encounter for screening for malignant neoplasm of respiratory organs: Secondary | ICD-10-CM

## 2024-10-20 DIAGNOSIS — Z87891 Personal history of nicotine dependence: Secondary | ICD-10-CM

## 2024-12-16 ENCOUNTER — Other Ambulatory Visit: Payer: Self-pay | Admitting: Family Medicine

## 2024-12-16 ENCOUNTER — Other Ambulatory Visit: Payer: Self-pay

## 2024-12-16 MED ORDER — AMLODIPINE BESYLATE 5 MG PO TABS: 5.0000 mg | ORAL_TABLET | Freq: Every day | ORAL | 3 refills | Status: AC

## 2024-12-16 NOTE — Telephone Encounter (Signed)
 Copied from CRM #[id number]. Topic: Clinical - Medication Refill >> Dec 16, 2024  4:00 PM Aisha D wrote: Medication: amLODipine  (NORVASC ) 5 MG tablet  Has the patient contacted their pharmacy? No (Agent: If no, request that the patient contact the pharmacy for the refill. If patient does not wish to contact the pharmacy document the reason why and proceed with request.) (Agent: If yes, when and what did the pharmacy advise?)  This is the patient's preferred pharmacy:   [facility] [place], [place] - [address] [facility] [address] [place] [address] Phone: [phone] Fax: [phone]    Is this the correct pharmacy for this prescription? Yes If no, delete pharmacy and type the correct one.   Has the prescription been filled recently? No  Is the patient out of the medication? No  Has the patient been seen for an appointment in the last year OR does the patient have an upcoming appointment? Yes  Can we respond through MyChart? No  Agent: Please be advised that Rx refills may take up to 3 business days. We ask that you follow-up with your pharmacy.

## 2025-10-11 ENCOUNTER — Ambulatory Visit: Payer: Medicare (Managed Care)
